# Patient Record
Sex: Male | Born: 1945 | Race: Asian | Hispanic: No | Smoking: Former smoker
Health system: Southern US, Community
[De-identification: ages and names within clinical notes are randomized; demographics above are authoritative.]

## PROBLEM LIST (undated history)

## (undated) ENCOUNTER — Emergency Department (HOSPITAL_COMMUNITY): Admission: EM | Disposition: A | Discharge status: Home / Self Care | Payer: Self-pay

## (undated) DIAGNOSIS — D126 Benign neoplasm of colon, unspecified: Secondary | ICD-10-CM

## (undated) DIAGNOSIS — A159 Respiratory tuberculosis unspecified: Secondary | ICD-10-CM

## (undated) DIAGNOSIS — F32A Depression, unspecified: Secondary | ICD-10-CM

## (undated) DIAGNOSIS — G8929 Other chronic pain: Secondary | ICD-10-CM

## (undated) DIAGNOSIS — K648 Other hemorrhoids: Secondary | ICD-10-CM

## (undated) DIAGNOSIS — R519 Headache, unspecified: Secondary | ICD-10-CM

## (undated) DIAGNOSIS — K5730 Diverticulosis of large intestine without perforation or abscess without bleeding: Secondary | ICD-10-CM

## (undated) DIAGNOSIS — C61 Malignant neoplasm of prostate: Secondary | ICD-10-CM

## (undated) DIAGNOSIS — I6203 Nontraumatic chronic subdural hemorrhage: Secondary | ICD-10-CM

## (undated) DIAGNOSIS — F329 Major depressive disorder, single episode, unspecified: Secondary | ICD-10-CM

## (undated) DIAGNOSIS — F419 Anxiety disorder, unspecified: Secondary | ICD-10-CM

## (undated) DIAGNOSIS — R51 Headache: Secondary | ICD-10-CM

## (undated) HISTORY — DX: Headache, unspecified: R51.9

## (undated) HISTORY — DX: Other chronic pain: G89.29

## (undated) HISTORY — DX: Major depressive disorder, single episode, unspecified: F32.9

## (undated) HISTORY — DX: Anxiety disorder, unspecified: F41.9

## (undated) HISTORY — PX: OTHER SURGICAL HISTORY: SHX169

## (undated) HISTORY — DX: Nontraumatic chronic subdural hemorrhage: I62.03

## (undated) HISTORY — DX: Depression, unspecified: F32.A

## (undated) HISTORY — DX: Respiratory tuberculosis unspecified: A15.9

## (undated) HISTORY — DX: Headache: R51

---

## 2008-06-29 ENCOUNTER — Encounter: Admission: RE | Admit: 2008-06-29 | Discharge: 2008-06-29 | Payer: Self-pay | Admitting: General Practice

## 2008-07-02 ENCOUNTER — Emergency Department (HOSPITAL_COMMUNITY): Admission: EM | Admit: 2008-07-02 | Discharge: 2008-07-02 | Payer: Self-pay | Admitting: Physician Assistant

## 2008-07-20 ENCOUNTER — Encounter: Admission: RE | Admit: 2008-07-20 | Discharge: 2008-07-20 | Payer: Self-pay | Admitting: Pulmonary Disease

## 2008-08-06 ENCOUNTER — Ambulatory Visit (HOSPITAL_COMMUNITY): Admission: AD | Admit: 2008-08-06 | Discharge: 2008-08-06 | Payer: Self-pay | Admitting: Neurological Surgery

## 2008-09-07 ENCOUNTER — Emergency Department (HOSPITAL_COMMUNITY): Admission: EM | Admit: 2008-09-07 | Discharge: 2008-09-07 | Payer: Self-pay | Admitting: Psychiatry

## 2009-06-04 ENCOUNTER — Encounter: Admission: RE | Admit: 2009-06-04 | Discharge: 2009-06-04 | Payer: Self-pay | Admitting: Family Medicine

## 2009-08-27 ENCOUNTER — Emergency Department (HOSPITAL_COMMUNITY): Admission: EM | Admit: 2009-08-27 | Discharge: 2009-08-27 | Payer: Self-pay | Admitting: Emergency Medicine

## 2009-08-30 ENCOUNTER — Encounter: Payer: Self-pay | Admitting: Physician Assistant

## 2009-09-03 ENCOUNTER — Encounter: Payer: Self-pay | Admitting: Physician Assistant

## 2009-09-19 ENCOUNTER — Encounter (INDEPENDENT_AMBULATORY_CARE_PROVIDER_SITE_OTHER): Payer: Self-pay | Admitting: Nurse Practitioner

## 2009-09-23 ENCOUNTER — Ambulatory Visit: Payer: Self-pay | Admitting: Physician Assistant

## 2009-09-23 ENCOUNTER — Telehealth: Payer: Self-pay | Admitting: Physician Assistant

## 2009-09-23 DIAGNOSIS — I6200 Nontraumatic subdural hemorrhage, unspecified: Secondary | ICD-10-CM | POA: Insufficient documentation

## 2009-09-23 DIAGNOSIS — K5900 Constipation, unspecified: Secondary | ICD-10-CM | POA: Insufficient documentation

## 2009-09-23 DIAGNOSIS — R519 Headache, unspecified: Secondary | ICD-10-CM | POA: Insufficient documentation

## 2009-09-23 DIAGNOSIS — N401 Enlarged prostate with lower urinary tract symptoms: Secondary | ICD-10-CM

## 2009-09-23 DIAGNOSIS — N138 Other obstructive and reflux uropathy: Secondary | ICD-10-CM

## 2009-09-23 DIAGNOSIS — K625 Hemorrhage of anus and rectum: Secondary | ICD-10-CM

## 2009-09-23 DIAGNOSIS — R51 Headache: Secondary | ICD-10-CM

## 2009-09-23 LAB — CONVERTED CEMR LAB: OCCULT 1: NEGATIVE

## 2009-09-24 DIAGNOSIS — R799 Abnormal finding of blood chemistry, unspecified: Secondary | ICD-10-CM

## 2009-09-24 DIAGNOSIS — R972 Elevated prostate specific antigen [PSA]: Secondary | ICD-10-CM

## 2009-09-24 LAB — CONVERTED CEMR LAB
ALT: 14 units/L (ref 0–53)
AST: 17 units/L (ref 0–37)
Albumin: 4.3 g/dL (ref 3.5–5.2)
Basophils Absolute: 0.1 10*3/uL (ref 0.0–0.1)
Basophils Relative: 1 % (ref 0–1)
Creatinine, Ser: 1.04 mg/dL (ref 0.40–1.50)
Eosinophils Relative: 1 % (ref 0–5)
Glucose, Bld: 107 mg/dL — ABNORMAL HIGH (ref 70–99)
Hemoglobin: 13.6 g/dL (ref 13.0–17.0)
Lymphocytes Relative: 39 % (ref 12–46)
Lymphs Abs: 2.2 10*3/uL (ref 0.7–4.0)
MCHC: 33.1 g/dL (ref 30.0–36.0)
Neutro Abs: 3.1 10*3/uL (ref 1.7–7.7)
PSA: 5.71 ng/mL — ABNORMAL HIGH (ref 0.10–4.00)
Platelets: 242 10*3/uL (ref 150–400)
RBC: 4.08 M/uL — ABNORMAL LOW (ref 4.22–5.81)
Sodium: 140 meq/L (ref 135–145)
WBC: 5.8 10*3/uL (ref 4.0–10.5)

## 2009-09-27 ENCOUNTER — Encounter: Payer: Self-pay | Admitting: Physician Assistant

## 2009-09-30 ENCOUNTER — Encounter (INDEPENDENT_AMBULATORY_CARE_PROVIDER_SITE_OTHER): Payer: Self-pay | Admitting: *Deleted

## 2009-10-09 ENCOUNTER — Ambulatory Visit: Payer: Self-pay | Admitting: Physician Assistant

## 2009-10-10 LAB — CONVERTED CEMR LAB
Phosphorus: 3.1 mg/dL (ref 2.3–4.6)
RBC Folate: 500 ng/mL (ref 180–600)
Vitamin B-12: 552 pg/mL (ref 211–911)

## 2009-10-11 ENCOUNTER — Encounter: Payer: Self-pay | Admitting: Physician Assistant

## 2009-10-11 LAB — CONVERTED CEMR LAB
BUN: 21 mg/dL (ref 6–23)
CO2: 21 meq/L (ref 19–32)
Chloride: 108 meq/L (ref 96–112)
Glucose, Bld: 81 mg/dL (ref 70–99)
Sodium: 141 meq/L (ref 135–145)

## 2009-10-13 ENCOUNTER — Telehealth: Payer: Self-pay | Admitting: Physician Assistant

## 2009-10-15 ENCOUNTER — Encounter: Payer: Self-pay | Admitting: Physician Assistant

## 2009-10-17 ENCOUNTER — Encounter (INDEPENDENT_AMBULATORY_CARE_PROVIDER_SITE_OTHER): Payer: Self-pay | Admitting: *Deleted

## 2009-10-24 ENCOUNTER — Ambulatory Visit: Payer: Self-pay | Admitting: Physician Assistant

## 2009-10-24 DIAGNOSIS — N529 Male erectile dysfunction, unspecified: Secondary | ICD-10-CM

## 2009-10-24 LAB — CONVERTED CEMR LAB
Blood in Urine, dipstick: NEGATIVE
Glucose, Urine, Semiquant: NEGATIVE
PSA: 4.91 ng/mL — ABNORMAL HIGH (ref 0.10–4.00)
Urobilinogen, UA: 0.2
pH: 5.5

## 2009-10-25 ENCOUNTER — Telehealth: Payer: Self-pay | Admitting: Physician Assistant

## 2009-10-25 ENCOUNTER — Encounter (INDEPENDENT_AMBULATORY_CARE_PROVIDER_SITE_OTHER): Payer: Self-pay | Admitting: *Deleted

## 2009-10-28 ENCOUNTER — Encounter: Payer: Self-pay | Admitting: Physician Assistant

## 2009-10-28 ENCOUNTER — Encounter (INDEPENDENT_AMBULATORY_CARE_PROVIDER_SITE_OTHER): Payer: Self-pay | Admitting: *Deleted

## 2009-10-30 ENCOUNTER — Encounter (INDEPENDENT_AMBULATORY_CARE_PROVIDER_SITE_OTHER): Payer: Self-pay | Admitting: *Deleted

## 2009-11-04 ENCOUNTER — Encounter: Payer: Self-pay | Admitting: Physician Assistant

## 2009-11-09 ENCOUNTER — Telehealth: Payer: Self-pay | Admitting: Physician Assistant

## 2010-04-07 ENCOUNTER — Encounter: Payer: Self-pay | Admitting: Neurological Surgery

## 2010-04-15 NOTE — Letter (Signed)
Summary: Generic Letter  [facility]  [address] [place], [place] [address]   Phone: [phone]  Fax: [phone]    09/27/2009  Fotios Lascola [address] [name], [place]  [address]  Dear Mr. Urton,  We have been unable to contact you by telephone.  Please call our office, at your earliest convenience, so that we may speak with you.   Sincerely,   Dutch Quint RN

## 2010-04-15 NOTE — Letter (Signed)
Summary: [facility]   [facility]   Imported By: Arta Bruce 11/11/2009 15:47:20  _____________________________________________________________________  External Attachment:    Type:   Image     Comment:   External Document

## 2010-04-15 NOTE — Progress Notes (Signed)
Summary: Seen by Neuro 8.22.2011  Phone Note Outgoing Call   Summary of Call: Rec'd notes from neurology. Patient arrived there without any interpreter. I don't think that is our responsibility, but if there is anything we can do to facilitate that in the future please do.  He is supposed to f/u with Dr. Anne Hahn in 3 mos.  Initial call taken by: Brynda Rim,  November 09, 2009 3:47 PM  Follow-up for Phone Call        I didn't made the referral but I will call them about the 3 months f/u . What I do is let the Dr's office that the pt need an interpreter or the pt need to provide one because some places don't have it .  I call pt and his phone number is disconected  Follow-up by: Cheryll Dessert,  November 12, 2009 5:31 PM    New/Updated Medications: NORTRIPTYLINE HCL 10 MG CAPS (NORTRIPTYLINE HCL) 1 by mouth at bedtime for 1 week, then 2 by mouth at bedtime    Past History:  Past Medical History: chronic headaches   a. eval by Dr. Anne Hahn 8.22.2011 and nortriptyline started h/o chronic subdural hematoma   Impression & Recommendations:  Problem # 1:  HEADACHE (ICD-784.0)  His updated medication list for this problem includes:    Naproxen 500 Mg Tabs (Naproxen) .Marland Kitchen... Take 1 tablet by mouth once a day with food as needed for pain  Complete Medication List: 1)  Naproxen 500 Mg Tabs (Naproxen) .... Take 1 tablet by mouth once a day with food as needed for pain 2)  Miralax Powd (Polyethylene glycol 3350) .... Dissolve one capful in glass of water and drink one glass once daily as needed for constipation 3)  Nortriptyline Hcl 10 Mg Caps (Nortriptyline hcl) .Marland Kitchen.. 1 by mouth at bedtime for 1 week, then 2 by mouth at bedtime

## 2010-04-15 NOTE — Letter (Signed)
Summary: *HSN Results Follow up  [facility]  [address] [place], [place] [address]   Phone: [phone]  Fax: [phone]      10/28/2009   [name] [address] [name], [place]  [address]   Dear  [name],                            ____S.Drinkard,FNP   ____D. Gore,FNP       ____B. McPherson,MD   ____V. Rankins,MD    ____E. Mulberry,MD    ____N. Daphine Deutscher, FNP  ____D. Reche Dixon, MD    ____K. Philipp Deputy, MD    ____Other     This letter is to inform you that your recent test(s):  _______Pap Smear    _______Lab Test     _______X-ray    _______ is within acceptable limits  _______ requires a medication change  _______ requires a follow-up lab visit  _______ requires a follow-up visit with your provider   Comments:  We have been trying to reach you.  Please give the office a call at your earliest convenience.       _________________________________________________________ If you have any questions, please contact our office                     Sincerely,  [name] [facility]

## 2010-04-15 NOTE — Letter (Signed)
Summary: *HSN Results Follow up  [facility]  [address] [place], [place] [address]   Phone: [phone]  Fax: [phone]      10/17/2009   [name] [address] [name], [place]  [address]   Dear  [name],                            ____S.Drinkard,FNP   ____D. Gore,FNP       ____B. McPherson,MD   ____V. Rankins,MD    ____E. Mulberry,MD    ____N. Daphine Deutscher, FNP  ____D. Reche Dixon, MD    ____K. Philipp Deputy, MD    ____Other     This letter is to inform you that your recent test(s):  _______Pap Smear    _______Lab Test     _______X-ray    _______ is within acceptable limits  _______ requires a medication change  _______ requires a follow-up lab visit  _______ requires a follow-up visit with your provider   Comments:  We have been trying to reach you.  Please give the office a call at your earliest convenience.       _________________________________________________________ If you have any questions, please contact our office                     Sincerely,  [name] [facility]

## 2010-04-15 NOTE — Letter (Signed)
Summary: *HSN Results Follow up  [facility]  [address] [place], [place] [address]   Phone: [phone]  Fax: [phone]      09/30/2009   Karmine Martos [address] [name], [place]  [address]   Dear  Mr. Fielding Staniszewski,                            ____S.Drinkard,FNP   ____D. Gore,FNP       ____B. McPherson,MD   ____V. Rankins,MD    ____E. Mulberry,MD    ____N. Daphine Deutscher, FNP  ____D. Reche Dixon, MD    ____K. Philipp Deputy, MD    ____Other     This letter is to inform you that your recent test(s):  _______Pap Smear    _______Lab Test     _______X-ray    _______ is within acceptable limits  ___X____ requires a medication change  ___X____ requires a follow-up lab visit  _______ requires a follow-up visit with your provider   Comments: We have been trying to reach you.  Please give the office a call at your earliest convenience.       _________________________________________________________ If you have any questions, please contact our office                     Sincerely,  [name] [facility]

## 2010-04-15 NOTE — Miscellaneous (Signed)
  Clinical Lists Changes  Problems: Assessed ELEVATED PROSTATE SPECIFIC ANTIGEN as comment only - repeat at OV 8.11.2011 refer to urology if still elevated        Impression & Recommendations:  Problem # 1:  ELEVATED PROSTATE SPECIFIC ANTIGEN (ICD-790.93) repeat at OV 8.11.2011 refer to urology if still elevated  Complete Medication List: 1)  Naproxen 500 Mg Tabs (Naproxen) .... Take 1 tablet by mouth once a day with food as needed for pain 2)  Miralax Powd (Polyethylene glycol 3350) .... Dissolve one capful in glass of water and drink one glass once daily as needed for constipation

## 2010-04-15 NOTE — Letter (Signed)
Summary: BLOOD PRESSURE READINGS  BLOOD PRESSURE READINGS   Imported By: Arta Bruce 10/28/2009 12:53:33  _____________________________________________________________________  External Attachment:    Type:   Image     Comment:   External Document

## 2010-04-15 NOTE — Letter (Signed)
Summary: [facility]  [facility]   Imported By: Arta Bruce 10/01/2009 12:30:21  _____________________________________________________________________  External Attachment:    Type:   Image     Comment:   External Document

## 2010-04-15 NOTE — Progress Notes (Signed)
Summary: Refer to Urology  Phone Note Outgoing Call   Summary of Call: Line disconnected... [name]  October 25, 2009 4:51 PM   PSA still elevated. Refer to urology. Notify [name] Send to [place] after [employer] notified. Initial call taken by: Tereso Newcomer PA-C,  October 25, 2009 9:15 AM  Follow-up for Phone Call        number disconnected and will mail letter... Follow-up by: [name],  October 25, 2009 12:45 PM  Additional Follow-up for Phone Call Additional follow up Details #1::        number is disconnected. . will mail letter... [name]  October 28, 2009 12:25 PM        Impression & Recommendations:  Problem # 1:  ELEVATED PROSTATE SPECIFIC ANTIGEN (ICD-790.93)  Orders: Urology Referral (Urology)  Complete Medication List: 1)  Naproxen 500 Mg Tabs (Naproxen) .... Take 1 tablet by mouth once a day with food as needed for pain 2)  Miralax Powd (Polyethylene glycol 3350) .... Dissolve one capful in glass of water and drink one glass once daily as needed for constipation

## 2010-04-15 NOTE — Letter (Signed)
Summary: [facility]  [facility]   Imported By: Arta Bruce 10/10/2009 15:18:54  _____________________________________________________________________  External Attachment:    Type:   Image     Comment:   External Document

## 2010-04-15 NOTE — Letter (Signed)
Summary: *HSN Results Follow up  [facility]  [address] [place], [place] [address]   Phone: [phone]  Fax: [phone]      10/15/2009   [name] [address] [name], [place]  [address]   Dear  [name],                            ____S.Drinkard,FNP   ____D. Gore,FNP       ____B. McPherson,MD   ____V. Rankins,MD    ____E. Mulberry,MD    ____N. Daphine Deutscher, FNP  ____D. Reche Dixon, MD    ____K. Philipp Deputy, MD    __x__S. Alben Spittle, PA-C     This letter is to inform you that your recent test(s):  _______Pap Smear    ___x____Lab Test     _______X-ray    ___x____ is within acceptable limits  _______ requires a medication change  ___x____ requires a follow-up lab visit  _______ requires a follow-up visit with your [facility]   Comments: We need to redo your prostate blood test at your next visit.  It was elevated when checked previously.       _________________________________________________________ If you have any questions, please contact our office                     Sincerely,  Ross Newcomer PA-C [facility]

## 2010-04-15 NOTE — Letter (Signed)
Summary: Letter//PLEASE CONTACT OFFICE  Letter//PLEASE CONTACT OFFICE   Imported By: Arta Bruce 09/19/2009 12:42:30  _____________________________________________________________________  External Attachment:    Type:   Image     Comment:   External Document
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
## 2010-04-15 NOTE — Progress Notes (Signed)
Summary: Neuro Appt.  Phone Note Outgoing Call   Summary of Call: Make sure Benedict Kue aware that he has appt with Dr. Anne Hahn at [facility] on 8.22.2011 at 3:00 Initial call taken by: Brynda Rim,  October 13, 2009 2:19 PM  Follow-up for Phone Call        line is busy Follow-up by: [name],  October 14, 2009 11:09 AM  Additional Follow-up for Phone Call Additional follow up Details #1::        number is disconnected.Marland KitchenMarland KitchenArmenia Shannon  October 14, 2009 3:39 PM number is disconnected.Marland KitchenMarland KitchenMarland KitchenMarland Kitchen will mail letter... [name]  October 17, 2009 8:43 AM

## 2010-04-15 NOTE — Letter (Signed)
Summary: *HSN Results Follow up  [facility]  [address] [place], [place] [address]   Phone: [phone]  Fax: [phone]      10/25/2009   [name] [address] [name], [place]  [address]   Dear  [name],                            ____S.Drinkard,FNP   ____D. Gore,FNP       ____B. McPherson,MD   ____V. Rankins,MD    ____E. Mulberry,MD    ____N. Daphine Deutscher, FNP  ____D. Reche Dixon, MD    ____K. Philipp Deputy, MD    ____Other     This letter is to inform you that your recent test(s):  _______Pap Smear    _______Lab Test     _______X-ray    _______ is within acceptable limits  _______ requires a medication change  _______ requires a follow-up lab visit  __X_____ requires a follow-up visit with your provider   Comments:  We have been trying to reach you.  Please give the office a call at your earliest convenience.       _________________________________________________________ If you have any questions, please contact our office                     Sincerely,  [name] [facility]
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
## 2010-04-15 NOTE — Assessment & Plan Note (Signed)
Summary: FU IN ONE MONTH WITH SCOTT, PSA////GK   Vital Signs:  Patient profile:   65 year old male Height:      62 inches Weight:      121.4 pounds BMI:     22.28 Temp:     97.1 degrees F oral Pulse rate:   64 / minute Pulse rhythm:   regular Resp:     18 per minute BP sitting:   105 / 68  (left arm) Cuff size:   regular  Vitals Entered By: [name] (October 24, 2009 3:29 PM) CC: 1 month follow-up PSA Is Patient Diabetic? No Pain Assessment Patient in pain? no       Does patient need assistance? Functional Status Self care Ambulation Normal   Primary Care Xavi Tomasik:  Tereso Newcomer, PA-C  CC:  1 month follow-up PSA.  History of Present Illness: Here for f/u. Labs ok.  But, PSA elevated. Needs repeat today. Had complained of BPH symptoms last time.  He had some enlargement of his prostate on exam.  Today states he was having trouble with urine leakage.  However, this is resolved.  He denies dysuria.  He is no longer having any problems with his urine. He tells the interpreter that he is turning from a male into a male.  Upon further questioning, he is describing erectile dysfunction. His headaches are better.  He was supposed to take naproxen as needed.  It sounds like he is taking every day. He has an appt with [facility] on 8/22.  However, I cannot be certain he knows how to get there.  He handed me the packet in the room and states he does not know how to get there.   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 Take 1 Tablet By Mouth Once A Day With Food As Needed For Pain 2)   Miralax  Powd (Polyethylene Glycol 3350) .... Dissolve One Capful in Glass of Water and Drink One Glass Once Daily As Needed For Constipation  Allergies (verified): No Known Drug Allergies  Past History:  Past Medical History: Last updated: 09/23/2009 chronic headaches h/o chronic subdural hematoma  Physical Exam  General:  alert, well-developed, and well-nourished.   Head:  normocephalic and atraumatic.   Neck:  supple.   Lungs:  normal breath sounds, no crackles, and no wheezes.   Heart:  normal rate and regular rhythm.   Abdomen:  soft, non-tender, and no hepatomegaly.   Neurologic:  alert & oriented X3 and cranial nerves II-XII intact.   Psych:  normally interactive.     Impression & Recommendations:  Problem # 1:  HEADACHE (ICD-784.0) 2/2 chronic subdural hematoma will give him instructions for tylenol and again reiterate that he takes the naproxen as needed he has appt with [facility] on 8.22 I have asked [facility] to call him and arrange help with getting to his appt  His updated medication list for this problem includes:    Naproxen 500 Mg Tabs (Naproxen) .Marland Kitchen... Take 1 tablet by mouth once a day with food as needed for pain  Problem # 2:  ELEVATED PROSTATE SPECIFIC ANTIGEN (ICD-790.93)  repeat today if elevated send to urology  Orders: T-PSA (04540-98119)  Problem # 3:  ORGANIC IMPOTENCE (ICD-607.84)  would defer to urology if he continues to have an elevated PSA  Orders: T-Urinalysis (14782-95621)  Problem # 4:  CBC, ABNORMAL (ICD-790.99) f/u testing normal  Problem # 5:  CONSTIPATION (ICD-564.00) states improved with miralax  His updated medication list for this problem includes:    Miralax Powd (Polyethylene glycol 3350) .Marland Kitchen... Dissolve one capful in glass of water and drink one glass once daily as needed for constipation  Problem # 6:  RECTAL BLEEDING (ICD-569.3) nothing further since he started miralax  Problem # 7:  PREVENTIVE HEALTH CARE  (ICD-V70.0) schedule CPE can discuss colo then  Complete Medication List: 1)  Naproxen 500 Mg Tabs (Naproxen) .... Take 1 tablet by mouth once a day with food as needed for pain 2)  Miralax Powd (Polyethylene glycol 3350) .... Dissolve one capful in glass of water and drink one glass once daily as needed for constipation  Patient Instructions: 1)  Please schedule a follow-up appointment in 4 months for CPE with Scott. 2)  You can take Tylenol (Acetaminophen) 500 mg 1-2 tabs every 6 hours as needed for headache.  You can get it over the counter at the pharmacy.  You do not need a prescription.  You can take this every day for headaches if needed.  You should never take more than 2 tablets every 6 hours. 3)  You should take the Naproxen with food two times a day only as needed for headaches.  Do not take every day. 4)  Ask for refills at the pharmacy for the miralax when you run out as well as your other medicine.  If you are out of refills, the pharmacy will call me. 5)  Someone from [facility] will contact you to help you get to your appointment with the neurologist.   Laboratory Results   Urine Tests  Date/Time Received: October 24, 2009 5:33 PM   Routine Urinalysis   Color: yellow Appearance: Clear Glucose: negative   (Normal Range: Negative) Bilirubin: negative   (Normal Range: Negative) Ketone: negative   (Normal Range: Negative) Spec. Gravity: >=1.030   (Normal Range: 1.003-1.035) Blood: negative   (Normal Range: Negative) pH: 5.5   (Normal Range: 5.0-8.0) Protein: trace   (Normal Range: Negative) Urobilinogen: 0.2   (Normal Range: 0-1) Nitrite: negative   (Normal Range: Negative) Leukocyte Esterace: negative   (Normal Range: Negative)

## 2010-04-15 NOTE — Progress Notes (Signed)
  Phone Note Outgoing Call   Summary of Call: please contact [facility] patient has appt 8.22 speaks vietnamese only asked [facility] to help him get to appt but they cannot please see if they have a way of contacting him to make sure he knows how to get there  Initial call taken by: Brynda Rim,  October 25, 2009 4:55 PM  Follow-up for Phone Call        number is disconnected. .... [name]  October 29, 2009 11:58 AM  number disconnected... [name]  October 30, 2009 10:51 AM   will mail letter.... [name]  October 30, 2009 12:49 PM

## 2010-04-15 NOTE — Letter (Signed)
Summary: APPT DATE & TIME [facility]  APPT DATE & TIME [facility]   Imported By: Arta Bruce 11/14/2009 14:57:09  _____________________________________________________________________  External Attachment:    Type:   Image     Comment:   External Document

## 2010-04-15 NOTE — Letter (Signed)
Summary: *HSN Results Follow up  [facility]  [address] [place], [place] [address]   Phone: [phone]  Fax: [phone]      10/30/2009   Montey Licklider [address] [name], [place]  [address]   Dear  Mr. Mateus Drawdy,                            ____S.Drinkard,FNP   ____D. Gore,FNP       ____B. McPherson,MD   ____V. Rankins,MD    ____E. Mulberry,MD    ____N. Daphine Deutscher, FNP  ____D. Reche Dixon, MD    ____K. Philipp Deputy, MD    ____Other     This letter is to inform you that your recent test(s):  _______Pap Smear    _______Lab Test     _______X-ray    _______ is within acceptable limits  _______ requires a medication change  _______ requires a follow-up lab visit  _______ requires a follow-up visit with your provider   Comments:  We have been trying to reach you.  Please give the office a call at your [place] convenience.       _________________________________________________________ If you have any questions, please contact our office                     Sincerely,  [name] [facility]

## 2010-05-15 ENCOUNTER — Encounter: Payer: Self-pay | Admitting: Internal Medicine

## 2010-05-15 ENCOUNTER — Encounter (INDEPENDENT_AMBULATORY_CARE_PROVIDER_SITE_OTHER): Payer: Self-pay | Admitting: Internal Medicine

## 2010-05-15 DIAGNOSIS — R82998 Other abnormal findings in urine: Secondary | ICD-10-CM | POA: Insufficient documentation

## 2010-05-15 LAB — CONVERTED CEMR LAB
Nitrite: POSITIVE
Protein, U semiquant: NEGATIVE
Urobilinogen, UA: 0.2
pH: 6

## 2010-05-16 ENCOUNTER — Encounter (INDEPENDENT_AMBULATORY_CARE_PROVIDER_SITE_OTHER): Payer: Self-pay | Admitting: Internal Medicine

## 2010-05-19 ENCOUNTER — Encounter (INDEPENDENT_AMBULATORY_CARE_PROVIDER_SITE_OTHER): Payer: Self-pay | Admitting: Internal Medicine

## 2010-05-19 ENCOUNTER — Telehealth (INDEPENDENT_AMBULATORY_CARE_PROVIDER_SITE_OTHER): Payer: Self-pay | Admitting: Internal Medicine

## 2010-05-19 DIAGNOSIS — E7800 Pure hypercholesterolemia, unspecified: Secondary | ICD-10-CM | POA: Insufficient documentation

## 2010-05-19 LAB — CONVERTED CEMR LAB
ALT: 13 units/L (ref 0–53)
Albumin: 4.6 g/dL (ref 3.5–5.2)
Alkaline Phosphatase: 49 units/L (ref 39–117)
Basophils Absolute: 0 10*3/uL (ref 0.0–0.1)
Glucose, Bld: 91 mg/dL (ref 70–99)
HCT: 41.6 % (ref 39.0–52.0)
Hemoglobin: 14 g/dL (ref 13.0–17.0)
Lymphocytes Relative: 32 % (ref 12–46)
MCHC: 33.7 g/dL (ref 30.0–36.0)
MCV: 97.2 fL (ref 78.0–100.0)
Neutro Abs: 3.7 10*3/uL (ref 1.7–7.7)
Neutrophils Relative %: 61 % (ref 43–77)
PSA: 6.02 ng/mL — ABNORMAL HIGH (ref <=4.00)
Potassium: 4.4 meq/L (ref 3.5–5.3)
RBC: 4.28 M/uL (ref 4.22–5.81)
Sodium: 138 meq/L (ref 135–145)
WBC: 6 10*3/uL (ref 4.0–10.5)

## 2010-05-23 ENCOUNTER — Encounter (INDEPENDENT_AMBULATORY_CARE_PROVIDER_SITE_OTHER): Payer: Self-pay | Admitting: *Deleted
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
## 2010-05-27 NOTE — Progress Notes (Signed)
Summary: Urology referral--another attempt  Phone Note Outgoing Call   Summary of Call: Nora--does not appears we were ever able to get hold of him for elevated PSA last fall.  PSA continues to rise and needs a Urology referral--new order written. Initial call taken by: Julieanne Manson MD,  May 19, 2010 2:45 PM  Follow-up for Phone Call        PT HAS AN APPT [facility] 06-10-10 @ 3:30PM DR MARK NESI  ADDRESS [address]  PHONE # DISC  MAILED A LETTER TO PT.Marland KitchenCheryll Dessert  May 23, 2010 9:37 AM      Appended Document: Urology referral--another attempt PT HAS AN APPT [facility]  06-03-10 @ 4:30PM PH # DISC MAILED A LETTER

## 2010-05-27 NOTE — Letter (Signed)
Summary: *Referral Letter  [facility]  [address] [place], [place] [address]   Phone: [phone]  Fax: [phone]    05/19/2010  Thank you in advance for agreeing to see my patient:  [name] [address] [name], [place]  [address]  Phone: [phone]  Reason for Referral: Increasing PSA with BPH and urinary obstruction symptoms.  Will enclose PSAs from last fall and recent.  We have had difficulty contacting pt. to get him referred.  He does not speak [place].  No obvious abnormality on prostate exam.  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) 2)  URINALYSIS, ABNORMAL (ICD-791.9) 3)  LONG-TERM (CURRENT) USE OF OTHER MEDICATIONS (ICD-V58.69) 4)  ORGANIC IMPOTENCE (ICD-607.84) 5)  ELEVATED PROSTATE SPECIFIC ANTIGEN (ICD-790.93) 6)  CBC, ABNORMAL (ICD-790.99) 7)  PREVENTIVE HEALTH CARE (ICD-V70.0) 8)  HYPERTROPHY PROSTATE W/UR OBST & OTH LUTS (ICD-600.01) 9)  RECTAL BLEEDING (ICD-569.3) 10)  CONSTIPATION (ICD-564.00) 11)  SUBDURAL HEMATOMA, CHRONIC (ICD-432.1) 12)  HEADACHE (ICD-784.0)   Current Medications: 1)  NAPROXEN 500 MG TABS (NAPROXEN) Take 1 tablet by mouth once a day with food as needed for pain 2)  MIRALAX  POWD (POLYETHYLENE GLYCOL 3350) dissolve one capful in glass of water and drink one glass once daily as needed for constipation 3)  NORTRIPTYLINE HCL 10 MG CAPS (NORTRIPTYLINE HCL) 1 by mouth at bedtime for 1 week, then 2 by mouth at bedtime 4)  TAMSULOSIN HCL 0.4 MG CAPS (TAMSULOSIN HCL) 1 tab by mouth at betime 5)  FINASTERIDE 5 MG TABS (FINASTERIDE) 1 tab by mouth daily   Past Medical History: 1)  chronic headaches 2)    a. eval by Dr. Anne Hahn 8.22.2011 and nortriptyline started 3)  h/o chronic subdural hematoma   Prior History of Blood Transfusions:   Pertinent Labs:    Thank you again for agreeing to see our patient;  please contact us if you have any further questions or need additional information.  Sincerely,  Julieanne Manson MD

## 2010-05-27 NOTE — Letter (Signed)
Summary: *HSN Results Follow up  [facility]  [address] [place], [place] [address]   Phone: [phone]  Fax: [phone]      05/23/2010   [name] [address] [name], [place]  [address]   Dear  [name],                            Comments: WE HAVE BEEN TRYING TO GET IN CONTACT WITH YOU BY PHONE BUT IS DISCCONECTED . DR MULBERRY WANT TO REFER YOU TO A UROLOGIST & GI  SPECIALIST.   *YOU HAVE AN APPT 06-10-10 @ 3:15 PM [facility] PH# [phone] ADDRESS [address]  ([facility]).  *YOU HAVE ANOTHER APPT [facility]  07-01-10 @ 10:00AM  DR Yancey Flemings  [facility] # [phone] ADDRESS [address] . PLEASE, TAKE YOUR MEDICATIONS WITH YOU AND YOUR MEDICAID.  IF YOU HAVE ANY QUESTIONS  PLEASE, CALL [name] [phone]  THANK YOU              _________________________________________________________ If you have any questions, please contact our office                     Sincerely,  [name] [facility]

## 2010-05-27 NOTE — Letter (Signed)
Summary: New Patient letter  [facility]  [address] [place], [place] [address]   Phone: [phone]  Fax: [phone]       05/23/2010 MRN: [id number]  [facility] [address] [name], [place]  [address]  Dear [name],  Welcome to the Gastroenterology Division at [facility].    You are scheduled to see Dr. Yancey Flemings on July 01, 2010 at 10:15am on the 3rd floor at [facility], [address]  We ask that you try to arrive at our office 15 minutes prior to your appointment time to allow for check-in.  We would like you to complete the enclosed self-administered evaluation form prior to your visit and bring it with you on the day of your appointment.  We will review it with you.  Also, please bring a complete list of all your medications or, if you prefer, bring the medication bottles and we will list them.  Please bring your insurance card so that we may make a copy of it.  If your insurance requires a referral to see a specialist, please bring your referral form from your primary care physician.  Co-payments are due at the time of your visit and may be paid by cash, check or credit card.     Your office visit will consist of a consult with your physician (includes a physical exam), any laboratory testing he/she may order, scheduling of any necessary diagnostic testing (e.g. x-ray, ultrasound, CT-scan), and scheduling of a procedure (e.g. Endoscopy, Colonoscopy) if required.  Please allow enough time on your schedule to allow for any/all of these possibilities.    If you cannot keep your appointment, please call [phone] to cancel or reschedule prior to your appointment date.  This allows [name] the opportunity to schedule an appointment for another patient in need of care.  If you do not cancel or reschedule by 5 p.m. the business day prior to your appointment date, you will be charged a $50.00 late cancellation/no-show fee.    Thank you for choosing  [facility] for your medical needs.  We appreciate the opportunity to care for you.  Please visit [name] at our website  to learn more about our practice.                     Sincerely,                                                             The Gastroenterology Division

## 2010-06-15 DIAGNOSIS — D126 Benign neoplasm of colon, unspecified: Secondary | ICD-10-CM

## 2010-06-15 HISTORY — DX: Benign neoplasm of colon, unspecified: D12.6

## 2010-06-23 LAB — DIFFERENTIAL
Eosinophils Absolute: 0 10*3/uL (ref 0.0–0.7)
Eosinophils Relative: 1 % (ref 0–5)
Lymphocytes Relative: 46 % (ref 12–46)
Lymphs Abs: 2.7 10*3/uL (ref 0.7–4.0)
Monocytes Relative: 7 % (ref 3–12)
Neutro Abs: 2.7 10*3/uL (ref 1.7–7.7)

## 2010-06-23 LAB — CBC
Hemoglobin: 13.5 g/dL (ref 13.0–17.0)
MCHC: 33.3 g/dL (ref 30.0–36.0)
MCV: 100.5 fL — ABNORMAL HIGH (ref 78.0–100.0)
Platelets: 196 10*3/uL (ref 150–400)
RBC: 4.03 MIL/uL — ABNORMAL LOW (ref 4.22–5.81)
WBC: 5.9 10*3/uL (ref 4.0–10.5)

## 2010-06-23 LAB — COMPREHENSIVE METABOLIC PANEL
AST: 21 U/L (ref 0–37)
Albumin: 3.8 g/dL (ref 3.5–5.2)
Chloride: 112 mEq/L (ref 96–112)
Creatinine, Ser: 1.06 mg/dL (ref 0.4–1.5)
GFR calc Af Amer: >60 mL/min (ref >60)
Glucose, Bld: 84 mg/dL (ref 70–99)
Potassium: 4.2 mEq/L (ref 3.5–5.1)
Sodium: 143 mEq/L (ref 135–145)
Total Protein: 6.8 g/dL (ref 6.0–8.3)

## 2010-06-23 LAB — RAPID URINE DRUG SCREEN, HOSP PERFORMED
Amphetamines: NOT DETECTED
Benzodiazepines: NOT DETECTED
Cocaine: NOT DETECTED
Opiates: NOT DETECTED

## 2010-06-23 LAB — URINALYSIS, ROUTINE W REFLEX MICROSCOPIC
Bilirubin Urine: NEGATIVE
Hgb urine dipstick: NEGATIVE
Ketones, ur: NEGATIVE mg/dL
Nitrite: NEGATIVE
Urobilinogen, UA: 0.2 mg/dL (ref 0.0–1.0)

## 2010-06-23 LAB — ETHANOL: Alcohol, Ethyl (B): 6 mg/dL (ref 0–10)

## 2010-06-24 LAB — BASIC METABOLIC PANEL
BUN: 16 mg/dL (ref 6–23)
CO2: 28 mEq/L (ref 19–32)
GFR calc Af Amer: >60 mL/min (ref >60)
GFR calc non Af Amer: >60 mL/min (ref >60)
Glucose, Bld: 93 mg/dL (ref 70–99)

## 2010-06-24 LAB — CBC
HCT: 40.3 % (ref 39.0–52.0)
Hemoglobin: 13.7 g/dL (ref 13.0–17.0)
RBC: 4.11 MIL/uL — ABNORMAL LOW (ref 4.22–5.81)
RDW: 12.9 % (ref 11.5–15.5)
WBC: 5.8 10*3/uL (ref 4.0–10.5)

## 2010-06-25 LAB — COMPREHENSIVE METABOLIC PANEL
AST: 16 U/L (ref 0–37)
Albumin: 3.4 g/dL — ABNORMAL LOW (ref 3.5–5.2)
Alkaline Phosphatase: 74 U/L (ref 39–117)
BUN: 16 mg/dL (ref 6–23)
Chloride: 107 mEq/L (ref 96–112)
Potassium: 4.2 mEq/L (ref 3.5–5.1)
Total Bilirubin: 0.3 mg/dL (ref 0.3–1.2)

## 2010-06-25 LAB — CBC
HCT: 40 % (ref 39.0–52.0)
Platelets: 219 10*3/uL (ref 150–400)
WBC: 6.5 10*3/uL (ref 4.0–10.5)

## 2010-06-25 LAB — DIFFERENTIAL
Basophils Absolute: 0 10*3/uL (ref 0.0–0.1)
Basophils Relative: 1 % (ref 0–1)
Eosinophils Relative: 3 % (ref 0–5)
Monocytes Absolute: 0.4 10*3/uL (ref 0.1–1.0)
Neutro Abs: 3.2 10*3/uL (ref 1.7–7.7)

## 2010-07-01 ENCOUNTER — Encounter: Payer: Self-pay | Admitting: Internal Medicine

## 2010-07-01 ENCOUNTER — Ambulatory Visit (INDEPENDENT_AMBULATORY_CARE_PROVIDER_SITE_OTHER): Payer: Medicaid Other | Admitting: Internal Medicine

## 2010-07-01 VITALS — BP 106/68 | HR 72 | Ht 61.0 in | Wt 123.0 lb

## 2010-07-01 DIAGNOSIS — K5900 Constipation, unspecified: Secondary | ICD-10-CM

## 2010-07-01 DIAGNOSIS — K625 Hemorrhage of anus and rectum: Secondary | ICD-10-CM

## 2010-07-01 DIAGNOSIS — Z1211 Encounter for screening for malignant neoplasm of colon: Secondary | ICD-10-CM

## 2010-07-01 NOTE — Progress Notes (Signed)
HISTORY OF PRESENT ILLNESS:  [name] is a 65 y.o. male [place] political refugee who is seen today regarding rectal bleeding and constipation. He is accompanied by a professional interpreter from [place]. The patient reports a several year history of intermittent rectal bleeding as manifested by red blood on the tissue and in the toilet bowl. He has had problems with constipation recently for which he was treated with MiraLax, with good results. Review of outside records finds a normal hemoglobin of 14.0 on 05/15/2010. He denies abdominal pain, weight loss, or family history of colon cancer. No prior colon cancer screening or colonoscopy.  REVIEW OF SYSTEMS:  All non-GI ROS negative except for headaches.  Past Medical History  Diagnosis Date  . Chronic headaches   . Subdural hematoma, chronic   . Anxiety   . Depression     Past Surgical History  Procedure Date  . Shrapnel removal     skull during [name] War    Social History [name]  reports that he has quit smoking. He has never used smokeless tobacco. He reports that he drinks alcohol. He reports that he does not use illicit drugs.  family history is not on file.  No Known Allergies     PHYSICAL EXAMINATION: Vital signs: BP 106/68  Pulse 72  Ht 5\' 1"  (1.549 m)  Wt 123 lb (55.792 kg)  BMI 23.24 kg/m2  Constitutional: generally well-appearing, no acute distress Psychiatric: alert and oriented x3, cooperative Eyes: extraocular movements intact, anicteric, conjunctiva pink Mouth: oral pharynx moist, no lesions Neck: supple no lymphadenopathy Cardiovascular: heart regular rate and rhythm, no murmur Lungs: clear to auscultation bilaterally Abdomen: soft, nontender, nondistended, no obvious ascites, no peritoneal signs, normal bowel sounds, no organomegaly Rectal: Deferred until colonoscopy Extremities: no lower extremity edema bilaterally Skin: no lesions on visible extremities Neuro: No focal deficits.    ASSESSMENT:  #1. Intermittent rectal bleeding. Given duration, likely due to benign anorectal pathology. Rule out neoplasia. #2. Constipation. Improved with MiraLax. #3. Colon cancer screening. Appropriate candidate without contraindication  PLAN:  #1. The MiraLax when necessary for constipation #2. Colonoscopy.The nature of the procedure, as well as the risks, benefits, and alternatives were carefully and thoroughly reviewed with the patient. Ample time for discussion and questions allowed. The patient understood, was satisfied, and agreed to proceed. Movi prep prescribed(actually given free sample). Patient instructed on its use.

## 2010-07-01 NOTE — Progress Notes (Signed)
This is a prior patient of mine from [facility]. Please fax to his new PCP.

## 2010-07-01 NOTE — Patient Instructions (Signed)
Colonoscopy scheduled for 07/04/10 8:00 am arrive at 7:30 am on 4th floor Movi prep sample given to you in office. Colonoscopy brochure given for you to review.

## 2010-07-02 NOTE — Progress Notes (Signed)
I will fax this over to [facility] today to Dr. Julieanne Manson. Danielle Rankin

## 2010-07-03 ENCOUNTER — Encounter: Payer: Self-pay | Admitting: Internal Medicine

## 2010-07-04 ENCOUNTER — Encounter: Payer: Self-pay | Admitting: Internal Medicine

## 2010-07-04 ENCOUNTER — Ambulatory Visit (AMBULATORY_SURGERY_CENTER): Payer: Medicaid Other | Admitting: Internal Medicine

## 2010-07-04 VITALS — BP 121/75 | HR 68 | Temp 97.8°F | Resp 20 | Ht 61.0 in | Wt 123.0 lb

## 2010-07-04 DIAGNOSIS — D126 Benign neoplasm of colon, unspecified: Secondary | ICD-10-CM

## 2010-07-04 DIAGNOSIS — Z1211 Encounter for screening for malignant neoplasm of colon: Secondary | ICD-10-CM

## 2010-07-04 DIAGNOSIS — K5730 Diverticulosis of large intestine without perforation or abscess without bleeding: Secondary | ICD-10-CM

## 2010-07-04 DIAGNOSIS — K625 Hemorrhage of anus and rectum: Secondary | ICD-10-CM

## 2010-07-04 MED ORDER — SODIUM CHLORIDE 0.9 % IV SOLN: 500.0000 mL | INTRAVENOUS | Status: DC

## 2010-07-04 NOTE — Patient Instructions (Signed)
Please refer to green and blue discharge instruction sheets.  Call [phone] for any questions or concerns.  Thank you for choosing [facility] for your medical needs.  A staff nurse will call you the next business day (Monday) as a courtesy.

## 2010-07-07 ENCOUNTER — Telehealth: Payer: Self-pay | Admitting: *Deleted

## 2010-07-07 NOTE — Telephone Encounter (Signed)
Follow up Call- Patient questions:  Do you have a fever, pain , or abdominal swelling? no Pain Score  0 *  Have you tolerated food without any problems? yes  Have you been able to return to your normal activities? yes  Do you have any questions about your discharge instructions: Diet   no Medications  no Follow up visit  yes  Do you have questions or concerns about your Care? no  Actions: * If pain score is 4 or above: No action needed, pain <4.  Patient's caregiver questioned whether he needed to come back for an appointment today. Informed only if he had a previously scheduled appointment or wsomeone had called him. Referred patient to [facility] report  Under recommendations.

## 2010-07-29 NOTE — Consult Note (Signed)
NAMEDEAVEN, URWIN                   ACCOUNT NO.:  [id number]   MEDICAL RECORD NO.:  [id number]          PATIENT TYPE:  EMS   LOCATION:  [facility]                         FACILITY:  [facility]   PHYSICIAN:  Stefani Dama, M.D.  DATE OF BIRTH:  [birth year]   DATE OF CONSULTATION:  07/02/2008  DATE OF DISCHARGE:  07/02/2008                                 CONSULTATION   REQUESTOR:  Katherine Roan, MD   REASON FOR REQUEST:  Subdural hematoma.   HISTORY OF PRESENT ILLNESS:  [name] is a 65 year old right-handed  [place] refugee who had been complaining of headache since his  arrival here back in November 2009.  The headaches had apparently become  progressively worse, and this past Friday he was seen in an urgent care  setting and a CT scan was performed.  The scan revealed the presence of  a moderately-sized right subdural hematoma over the parietal convexity  which caused some mass effect on the brain and some 3-4 mm of midline  shift.  The patient has been complaining of headache in the left frontal  and left parietal regions.  He was brought to the [facility]  Room today where a CT scan was repeated and it showed essentially no  change from the scan a few days ago.  The patient is now seen in  neurosurgical consultation for definitive treatment.  The history is  difficult to obtain.  We had used 2 different telephone interpreters to  obtain some further history.  The patient obviously had a traumatic past  having been in a reeducation camp for 6 years in [name] prior to  his release and ultimate exodus from the country.  He had concerns about  having any surgical procedures performed and he related this rather  clearly to 2 separate interpreters indicating that he did not want to  have an operation and die.  His understanding of the fact that he has a  subdural hematoma is fairly clear and on the basis of discussions with  the interpreters, their sense is that the  patient is lucid.  He is clear  in his thoughts and his desires.   His past medical history is notable for the fact that he has not had any  medical treatment as best can be determined.   It is unknown that he has any allergies, and clearly he has not been on  any medications.   SOCIAL HISTORY:  He is married.  His wife had been injured when a tree  fell on her and she has had a significant number of facial surgeries for  broken bones, this apparently had occurred while they were still in  [name].  The patient's working status is otherwise is that of a refugee  but it is apparent that he does not have a job as best I can determine  at the current time.  Caseworker that is present with him knows little  about him as she is not the usual caseworker for him.   His systems review is notable that he denies  any nausea or vomiting.  Denies diplopia, ringing in the years.  His singular complaint is head  pain in the left frontal region.  He also complains of some low back  pain.  He denies any numbness or tingling in his legs or in his arms.   On his physical examination, his pupils are 4-mm briskly reactive to  light and accommodation.  The extraocular movements are full.  Face is  symmetric to grimace.  Tongue and uvula are in the midline.  Head  reveals an abnormal shape with an enlarged right parietal fossa, but no  evidence of any subgaleal collections or hematomas.  There is no  tenderness in about the head.  His neck reveals normal carotid  upstrokes.  No masses are noted.  There is no tenderness or fullness in  the supraclavicular fossae.  His lungs are clear to auscultation.  The  heart has a regular rate and rhythm.  No murmurs are noted.  The abdomen  is soft.  Bowel sounds are positive.  No masses are noted.  Extremities  reveal no cyanosis, clubbing, or edema.  Movement of all 4 extremities  is good.  They are symmetric.  There is no evidence of a drift.  Deep  tendon reflexes  are 2+ in the biceps and triceps, 1+ in the patellae and  the Achilles.  Babinski's are downgoing.   IMPRESSION:  The patient has evidence of a right-sided chronic and  subacute subdural hematoma.  The patient is adamant about not having  surgical treatment at the current time.  I discussed with him several  different ways through 2 different interpreters, the importance of  treatment of this process that though there is a risk of death with  surgery, is imminently small that there is a greater risk of this  process becoming more severe and chronic and possibly needing surgery on  an emergent basis.  At this time, he is rather adamant about his refusal  for surgery and I suggested that in a minimum we have a followup scan in  a month's time, and I can see him as an outpatient in the office.  In  the meantime, I discussed with his caseworker and with the patient as  best I could through the interpreter available that he would need to be  followed up, and should he have any worsening he needs to return to the  emergency department or contact our office.      Stefani Dama, M.D.  Electronically Signed     HJE/MEDQ  D:  07/02/2008  T:  07/03/2008  Job:  [id number]

## 2010-08-15 DIAGNOSIS — K5730 Diverticulosis of large intestine without perforation or abscess without bleeding: Secondary | ICD-10-CM

## 2010-08-15 DIAGNOSIS — K648 Other hemorrhoids: Secondary | ICD-10-CM

## 2010-08-15 HISTORY — DX: Diverticulosis of large intestine without perforation or abscess without bleeding: K57.30

## 2010-08-15 HISTORY — DX: Other hemorrhoids: K64.8

## 2010-08-25 ENCOUNTER — Ambulatory Visit
Admission: RE | Admit: 2010-08-25 | Discharge: 2010-08-25 | Disposition: A | Discharge status: Home / Self Care | Payer: Medicaid Other | Source: Ambulatory Visit | Attending: Radiation Oncology | Admitting: Radiation Oncology

## 2010-08-25 DIAGNOSIS — G4730 Sleep apnea, unspecified: Secondary | ICD-10-CM | POA: Insufficient documentation

## 2010-08-25 DIAGNOSIS — C61 Malignant neoplasm of prostate: Secondary | ICD-10-CM | POA: Insufficient documentation

## 2010-08-25 DIAGNOSIS — Z79899 Other long term (current) drug therapy: Secondary | ICD-10-CM | POA: Insufficient documentation

## 2010-08-25 DIAGNOSIS — Z510 Encounter for antineoplastic radiation therapy: Secondary | ICD-10-CM | POA: Insufficient documentation

## 2010-11-24 ENCOUNTER — Ambulatory Visit
Admission: RE | Admit: 2010-11-24 | Discharge: 2010-11-24 | Disposition: A | Discharge status: Home / Self Care | Payer: Medicaid Other | Source: Ambulatory Visit | Attending: Radiation Oncology | Admitting: Radiation Oncology

## 2010-11-24 DIAGNOSIS — C61 Malignant neoplasm of prostate: Secondary | ICD-10-CM | POA: Insufficient documentation

## 2010-11-24 DIAGNOSIS — Z510 Encounter for antineoplastic radiation therapy: Secondary | ICD-10-CM | POA: Insufficient documentation

## 2010-12-25 ENCOUNTER — Ambulatory Visit
Admission: RE | Admit: 2010-12-25 | Discharge: 2010-12-25 | Disposition: A | Discharge status: Home / Self Care | Payer: Medicaid Other | Source: Ambulatory Visit | Attending: Radiation Oncology | Admitting: Radiation Oncology

## 2012-01-02 ENCOUNTER — Emergency Department (INDEPENDENT_AMBULATORY_CARE_PROVIDER_SITE_OTHER)
Admission: EM | Admit: 2012-01-02 | Discharge: 2012-01-02 | Disposition: A | Discharge status: Home / Self Care | Payer: Medicaid Other | Source: Home / Self Care | Attending: Emergency Medicine | Admitting: Emergency Medicine

## 2012-01-02 ENCOUNTER — Encounter (HOSPITAL_COMMUNITY): Payer: Self-pay | Admitting: Emergency Medicine

## 2012-01-02 DIAGNOSIS — J069 Acute upper respiratory infection, unspecified: Secondary | ICD-10-CM

## 2012-01-02 HISTORY — DX: Malignant neoplasm of prostate: C61

## 2012-01-02 MED ORDER — GUAIFENESIN-CODEINE 100-10 MG/5ML PO SYRP: 5.0000 mL | ORAL_SOLUTION | Freq: Three times a day (TID) | ORAL | Status: DC | PRN

## 2012-01-02 MED ORDER — SALINE NASAL SPRAY 0.65 % NA SOLN: 1.0000 | NASAL | Status: DC | PRN

## 2012-01-02 MED ORDER — CETIRIZINE HCL 10 MG PO CHEW: 10.0000 mg | CHEWABLE_TABLET | Freq: Every day | ORAL | Status: DC

## 2012-01-02 NOTE — ED Notes (Signed)
Pt is here w/sponsor... Via interpreter, pt c/o cough x4 weeks w/yellow sputum... Sx include: sore throat, itchy throat, loss of appetite... Denies: fevers, vomiting, diarrhea... Pt is alert w/no signs of distress.

## 2012-01-02 NOTE — ED Provider Notes (Signed)
History     CSN: [id number]  Arrival date & time 01/02/12  1230   None     Chief Complaint  Patient presents with  . Cough    (Consider location/radiation/quality/duration/timing/severity/associated sxs/prior treatment) Patient is a 65 y.o. male presenting with cough. The history is provided by the patient. The history is limited by a language barrier. A language interpreter was used.  Cough This is a new problem. The current episode started more than 1 week ago (3 weeks ago). The problem occurs hourly. The problem has not changed since onset.The cough is productive of sputum. There has been no fever. Associated symptoms include rhinorrhea and sore throat. He has tried decongestants (mucinex) for the symptoms. The treatment provided mild relief. He is not a smoker (former). His past medical history does not include bronchitis, pneumonia or COPD.    Past Medical History  Diagnosis Date  . Chronic headaches   . Subdural hematoma, chronic   . Anxiety   . Depression   . Prostate cancer     receiving radiation treatment    Past Surgical History  Procedure Date  . Shrapnel removal     skull during [name] War    No family history on file.  History  Substance Use Topics  . Smoking status: Former Games developer  . Smokeless tobacco: Never Used  . Alcohol Use: 0.6 oz/week    1 Cans of beer per week     1 beer daily      Review of Systems  Constitutional: Positive for appetite change.  HENT: Positive for congestion, sore throat, rhinorrhea and sinus pressure.   Eyes: Positive for itching.  Respiratory: Positive for cough.   All other systems reviewed and are negative.    Allergies  Review of patient's allergies indicates no known allergies.  Home Medications   Current Outpatient Rx  Name Route Sig Dispense Refill  . CETIRIZINE HCL 10 MG PO CHEW Oral Chew 1 tablet (10 mg total) by mouth daily. 30 tablet 3  . FINASTERIDE 5 MG PO TABS Oral Take 5 mg by mouth daily.        . GUAIFENESIN-CODEINE 100-10 MG/5ML PO SYRP Oral Take 5 mLs by mouth 3 (three) times daily as needed for cough. 180 mL 0  . NAPROXEN 500 MG PO TABS Oral Take 500 mg by mouth daily as needed. With food     . NORTRIPTYLINE HCL 10 MG PO CAPS Oral Take 10 mg by mouth 2 (two) times daily. At bedtime     . POLYETHYLENE GLYCOL 3350 PO PACK Oral Take 17 g by mouth daily.      Marland Kitchen SALINE NASAL SPRAY 0.65 % NA SOLN Nasal Place 1 spray into the nose as needed for congestion. 30 mL 12  . TAMSULOSIN HCL 0.4 MG PO CAPS Oral Take 0.4 mg by mouth at bedtime.        BP 105/72  Pulse 63  Temp 98 F (36.7 C) (Oral)  Resp 18  SpO2 99%  Physical Exam  Nursing note and vitals reviewed. Constitutional: He is oriented to person, place, and time. Vital signs are normal. He appears well-developed and well-nourished. He is active and cooperative.  HENT:  Head: Normocephalic.  Right Ear: Tympanic membrane, external ear and ear canal normal. Decreased hearing is noted.  Left Ear: Hearing, external ear and ear canal normal.  Nose: Rhinorrhea present.  Mouth/Throat: Uvula is midline and mucous membranes are normal. Posterior oropharyngeal erythema present. No oropharyngeal exudate or posterior  oropharyngeal edema.       Unable to visualize left TM, cerumen impaction. Nasal discharge  Eyes: Conjunctivae normal and EOM are normal. Pupils are equal, round, and reactive to light. No scleral icterus.  Neck: Trachea normal, normal range of motion and full passive range of motion without pain. Neck supple.  Cardiovascular: Normal rate, regular rhythm, normal heart sounds, intact distal pulses and normal pulses.   Pulmonary/Chest: Effort normal and breath sounds normal.  Lymphadenopathy:       Head (right side): No submental, no submandibular, no tonsillar, no preauricular, no posterior auricular and no occipital adenopathy present.       Head (left side): No submental, no submandibular, no tonsillar, no preauricular, no  posterior auricular and no occipital adenopathy present.    He has no cervical adenopathy.  Neurological: He is alert and oriented to person, place, and time. No cranial nerve deficit or sensory deficit.  Skin: Skin is warm and dry.  Psychiatric: He has a normal mood and affect. His speech is normal and behavior is normal. Judgment and thought content normal. Cognition and memory are normal.    ED Course  Procedures (including critical care time)   Labs Reviewed  POCT RAPID STREP A ([facility] CARE ONLY)   No results found.  Rapid strep negative 1. URI (upper respiratory infection)       MDM  Increase fluids, saline nasal spray for sinus congestion.  Zyrtec daily.  Cough syrup as needed.  Follow up with primary care provider as needed.        Johnsie Kindred, NP 01/02/12 1443

## 2012-01-03 NOTE — ED Provider Notes (Signed)
Medical screening examination/treatment/procedure(s) were performed by non-physician practitioner and as supervising physician I was immediately available for consultation/collaboration.  Leslee Home, M.D.   Reuben Likes, MD 01/03/12 2138

## 2012-03-06 ENCOUNTER — Encounter (HOSPITAL_COMMUNITY): Payer: Self-pay

## 2012-03-06 ENCOUNTER — Emergency Department (INDEPENDENT_AMBULATORY_CARE_PROVIDER_SITE_OTHER): Payer: Medicaid Other

## 2012-03-06 ENCOUNTER — Emergency Department (HOSPITAL_COMMUNITY)
Admission: EM | Admit: 2012-03-06 | Discharge: 2012-03-06 | Disposition: A | Discharge status: Home / Self Care | Payer: Medicaid Other | Source: Home / Self Care

## 2012-03-06 DIAGNOSIS — K1230 Oral mucositis (ulcerative), unspecified: Secondary | ICD-10-CM

## 2012-03-06 DIAGNOSIS — K121 Other forms of stomatitis: Secondary | ICD-10-CM

## 2012-03-06 DIAGNOSIS — R109 Unspecified abdominal pain: Secondary | ICD-10-CM

## 2012-03-06 LAB — POCT I-STAT, CHEM 8
BUN: 20 mg/dL (ref 6–23)
Creatinine, Ser: 1.4 mg/dL — ABNORMAL HIGH (ref 0.50–1.35)
Potassium: 4.7 mEq/L (ref 3.5–5.1)
Sodium: 141 mEq/L (ref 135–145)

## 2012-03-06 MED ORDER — MAGIC MOUTHWASH W/LIDOCAINE: 5.0000 mL | Freq: Four times a day (QID) | ORAL | Status: DC | PRN

## 2012-03-06 NOTE — ED Provider Notes (Signed)
History     CSN: [id number]  Arrival date & time 03/06/12  1427   None     Chief Complaint  Patient presents with  . Bloated    (Consider location/radiation/quality/duration/timing/severity/associated sxs/prior treatment) HPI Comments: 66 year old [place] male presents with his "girlfriend" to be evaluated for oral lesions that are painful; and laying around and weak. She noticed that his recent stool had something that looked like worms in it. The patient's greatest concern is that of oral buccal pain. The girlfriend denies he has had fever or vomiting but that he has been having intermittent abdominal discomfort. Denies cough, shortness of breath or urinary symptoms.   History reviewed. No pertinent past medical history.  History reviewed. No pertinent past surgical history.  History reviewed. No pertinent family history.  History  Substance Use Topics  . Smoking status: Not on file  . Smokeless tobacco: Not on file  . Alcohol Use: Not on file      Review of Systems  Constitutional: Positive for activity change, fatigue and unexpected weight change. Negative for fever.  HENT: Negative.   Respiratory: Negative for chest tightness and shortness of breath.        Rare loose cough but not a chief complaint.  Cardiovascular: Negative for chest pain and leg swelling.  Gastrointestinal: Positive for abdominal pain. Negative for blood in stool.  Genitourinary: Negative.   Skin: Negative.     Allergies  Review of patient's allergies indicates no known allergies.  Home Medications   Current Outpatient Rx  Name  Route  Sig  Dispense  Refill  . MAGIC MOUTHWASH W/LIDOCAINE   Oral   Take 5 mLs by mouth 4 (four) times daily as needed.   180 mL   0     BP 127/86  Pulse 70  Temp 99 F (37.2 C) (Oral)  Resp 18  SpO2 99%  Physical Exam  Nursing note and vitals reviewed. Constitutional: He appears well-developed and well-nourished. No distress.  HENT:   Mouth/Throat: No oropharyngeal exudate.       There are multiple ovoid ulcerations of the tongue mucosa surface of the lips and buccal mucosa.  Neck: Neck supple.  Cardiovascular: Normal rate and normal heart sounds.   Pulmonary/Chest: Effort normal and breath sounds normal. No respiratory distress. He has no wheezes. He has no rales.  Musculoskeletal: He exhibits no edema and no tenderness.  Neurological: He is alert.  Skin: Skin is warm and dry.  Psychiatric: He has a normal mood and affect.    ED Course  Procedures (including critical care time)  Labs Reviewed  POCT I-STAT, CHEM 8 - Abnormal; Notable for the following:    Creatinine, Ser 1.40 (*)     All other components within normal limits   Dg Chest 2 View  03/06/2012  *RADIOLOGY REPORT*  Clinical Data: Fatigue, weight loss  CHEST - 2 VIEW  Comparison: None  Findings: Normal heart size, mediastinal contours, and pulmonary vascularity. Atherosclerotic calcification aorta. Lungs clear. No pleural effusion or pneumothorax. Old right rib fractures.  IMPRESSION: No acute abnormalities.   Original Report Authenticated By: Ulyses Southward, M.D.      1. Stomatitis and mucositis   2. Abdominal  pain, other specified site       MDM  There seems to be in significant treatment since the patient has been here. He slept on the edge of the bed at discharge the smiling and thanking me for his services. He is not complaining of abdominal pain  nor does he have any other complaints at this time other than his oral pain. He is prescribed Magic mouthwash containing nystatin and Xylocaine along with the other basic ingredients. For additional workup of the alleged parasite siting and abdominal discomfort that comes and goes he will followup with Dr. Quitman Livings which is on his Medicaid card. He was unable to produce a stool for [name] today. He is stable on discharge.        Hayden Rasmussen, NP 03/06/12 [phone]

## 2012-03-06 NOTE — ED Notes (Signed)
Discussed formulation of MAGIC MOUTHWASH w pharmacist

## 2012-03-06 NOTE — ED Provider Notes (Signed)
Medical screening examination/treatment/procedure(s) were performed by non-physician practitioner and as supervising physician I was immediately available for consultation/collaboration.  Leslee Home, M.D.   Reuben Likes, MD 03/06/12 2027

## 2012-03-06 NOTE — ED Notes (Signed)
Roommate/partner states she noted presence of "critters down there" the other day after he had a bath,and also has oral pain and ulcers

## 2012-03-07 ENCOUNTER — Encounter (HOSPITAL_COMMUNITY): Payer: Self-pay | Admitting: Emergency Medicine

## 2012-03-09 ENCOUNTER — Encounter (HOSPITAL_COMMUNITY): Payer: Self-pay | Admitting: Emergency Medicine

## 2012-03-09 ENCOUNTER — Emergency Department (HOSPITAL_COMMUNITY)
Admission: EM | Admit: 2012-03-09 | Discharge: 2012-03-09 | Disposition: A | Discharge status: Home / Self Care | Payer: Medicaid Other | Attending: Emergency Medicine | Admitting: Emergency Medicine

## 2012-03-09 DIAGNOSIS — K625 Hemorrhage of anus and rectum: Secondary | ICD-10-CM | POA: Insufficient documentation

## 2012-03-09 DIAGNOSIS — K1230 Oral mucositis (ulcerative), unspecified: Secondary | ICD-10-CM | POA: Insufficient documentation

## 2012-03-09 DIAGNOSIS — Z87891 Personal history of nicotine dependence: Secondary | ICD-10-CM | POA: Insufficient documentation

## 2012-03-09 DIAGNOSIS — F329 Major depressive disorder, single episode, unspecified: Secondary | ICD-10-CM | POA: Insufficient documentation

## 2012-03-09 DIAGNOSIS — K121 Other forms of stomatitis: Secondary | ICD-10-CM | POA: Insufficient documentation

## 2012-03-09 DIAGNOSIS — F3289 Other specified depressive episodes: Secondary | ICD-10-CM | POA: Insufficient documentation

## 2012-03-09 DIAGNOSIS — R51 Headache: Secondary | ICD-10-CM | POA: Insufficient documentation

## 2012-03-09 DIAGNOSIS — G8929 Other chronic pain: Secondary | ICD-10-CM | POA: Insufficient documentation

## 2012-03-09 DIAGNOSIS — Z8546 Personal history of malignant neoplasm of prostate: Secondary | ICD-10-CM | POA: Insufficient documentation

## 2012-03-09 DIAGNOSIS — F411 Generalized anxiety disorder: Secondary | ICD-10-CM | POA: Insufficient documentation

## 2012-03-09 LAB — CBC WITH DIFFERENTIAL/PLATELET
Basophils Absolute: 0.1 10*3/uL (ref 0.0–0.1)
Basophils Relative: 1 % (ref 0–1)
Eosinophils Absolute: 0.1 10*3/uL (ref 0.0–0.7)
Eosinophils Relative: 1 % (ref 0–5)
HCT: 36.5 % — ABNORMAL LOW (ref 39.0–52.0)
Hemoglobin: 12.7 g/dL — ABNORMAL LOW (ref 13.0–17.0)
Lymphocytes Relative: 35 % (ref 12–46)
Lymphs Abs: 1.9 10*3/uL (ref 0.7–4.0)
MCH: 33.2 pg (ref 26.0–34.0)
MCHC: 34.8 g/dL (ref 30.0–36.0)
MCV: 95.3 fL (ref 78.0–100.0)
Monocytes Absolute: 0.4 10*3/uL (ref 0.1–1.0)
Monocytes Relative: 6 % (ref 3–12)
Neutro Abs: 3.2 10*3/uL (ref 1.7–7.7)
Neutrophils Relative %: 57 % (ref 43–77)
Platelets: 246 10*3/uL (ref 150–400)
RBC: 3.83 MIL/uL — ABNORMAL LOW (ref 4.22–5.81)
RDW: 12.1 % (ref 11.5–15.5)
WBC: 5.6 10*3/uL (ref 4.0–10.5)

## 2012-03-09 LAB — COMPREHENSIVE METABOLIC PANEL
ALT: 20 U/L (ref 0–53)
AST: 20 U/L (ref 0–37)
Albumin: 3.5 g/dL (ref 3.5–5.2)
Alkaline Phosphatase: 75 U/L (ref 39–117)
BUN: 16 mg/dL (ref 6–23)
CO2: 27 mEq/L (ref 19–32)
Calcium: 8.8 mg/dL (ref 8.4–10.5)
Chloride: 103 mEq/L (ref 96–112)
Creatinine, Ser: 1.14 mg/dL (ref 0.50–1.35)
GFR calc Af Amer: 77 mL/min — ABNORMAL LOW (ref >90)
GFR calc non Af Amer: 66 mL/min — ABNORMAL LOW (ref >90)
Glucose, Bld: 89 mg/dL (ref 70–99)
Potassium: 4.2 mEq/L (ref 3.5–5.1)
Sodium: 138 mEq/L (ref 135–145)
Total Bilirubin: 0.3 mg/dL (ref 0.3–1.2)
Total Protein: 7.4 g/dL (ref 6.0–8.3)

## 2012-03-09 LAB — URINALYSIS, ROUTINE W REFLEX MICROSCOPIC
Bilirubin Urine: NEGATIVE
Glucose, UA: NEGATIVE mg/dL
Hgb urine dipstick: NEGATIVE
Ketones, ur: NEGATIVE mg/dL
Leukocytes, UA: NEGATIVE
Nitrite: NEGATIVE
Protein, ur: NEGATIVE mg/dL
Specific Gravity, Urine: 1.009 (ref 1.005–1.030)
Urobilinogen, UA: 0.2 mg/dL (ref 0.0–1.0)
pH: 6.5 (ref 5.0–8.0)

## 2012-03-09 LAB — ABO/RH: ABO/RH(D): O POS

## 2012-03-09 LAB — TYPE AND SCREEN
ABO/RH(D): O POS
Antibody Screen: NEGATIVE

## 2012-03-09 MED ORDER — SODIUM CHLORIDE 0.9 % IV BOLUS (SEPSIS)
1000.0000 mL | Freq: Once | INTRAVENOUS | Status: AC
  Administered 2012-03-09: 1000 mL via INTRAVENOUS

## 2012-03-09 NOTE — ED Notes (Signed)
Pt c/o of rectal bleeding, mouth sores, and weakness. Denies LOC.

## 2012-03-09 NOTE — ED Provider Notes (Signed)
History     CSN: [id number]  Arrival date & time 03/09/12  1317   First MD Initiated Contact with Patient 03/09/12 1432      Chief Complaint  Patient presents with  . Rectal Bleeding    (Consider location/radiation/quality/duration/timing/severity/associated sxs/prior treatment) HPI Comments: 66 year old male with a past medical history of prostate cancer presents today with chief complaint of rectal bleeding.  He states that he has had intermittent rectal bleeding over the past year but it has worsened significantly in the past week.  He has large amount of blood filling the toilet bowl with every bowel movement.  He has also had increased weakness over the past week.  Patient was seen and evaluated for complaint of mouth ulcers 3 days ago.  He was prescribed Magic malformation with lidocaine.  He states he has been unable to eat prior to administration of his Magic mouthwash.  He has been able to take liquids including soups and broth since that time.  Patient had an episode of severe weakness getting in the car.  He denies loss of consciousness or syncope. Recent denies  Vomiting, nausea, or diarrhea. He has no rectal pain. Denies fevers, chills, fatigue, night sweats, unexplained weight loss. Denies DOE, SOB, chest tightness or pressure, radiation to left arm, jaw or back, or diaphoresis. Denies dysuria, flank pain, suprapubic pain, frequency, urgency, or hematuria. Denies headaches, light headedness, weakness, visual disturbances. Denies abdominal pain, nausea, vomiting, diarrhea or constipation.       The history is provided by the patient and a friend. The history is limited by a language barrier. A language interpreter was used.    Past Medical History  Diagnosis Date  . Chronic headaches   . Subdural hematoma, chronic   . Anxiety   . Depression   . Prostate cancer     receiving radiation treatment    Past Surgical History  Procedure Date  . Shrapnel removal    skull during [name] War    No family history on file.  History  Substance Use Topics  . Smoking status: Former Games developer  . Smokeless tobacco: Never Used  . Alcohol Use: No     Comment: 1 beer daily      Review of Systems Ten systems reviewed and are negative for acute change, except as noted in the HPI.   Allergies  Review of patient's allergies indicates no known allergies.  Home Medications   Current Outpatient Rx  Name  Route  Sig  Dispense  Refill  . MAGIC MOUTHWASH W/LIDOCAINE   Oral   Take 5 mLs by mouth 4 (four) times daily as needed.   180 mL   0     BP 102/69  Pulse 72  Temp 98.9 F (37.2 C) (Oral)  Resp 16  SpO2 99%  Physical Exam  Nursing note and vitals reviewed. Constitutional: He is oriented to person, place, and time. He appears well-developed and well-nourished. No distress.  HENT:  Head: Normocephalic and atraumatic.  Eyes: Conjunctivae normal are normal. No scleral icterus.       Large, plaquelike  Ulcer on the tongue consistent with aphthous ulcer.  Neck: Normal range of motion. Neck supple.  Cardiovascular: Normal rate, regular rhythm, normal heart sounds and intact distal pulses.   Pulmonary/Chest: Effort normal and breath sounds normal. No respiratory distress.  Abdominal: Soft. There is tenderness (suprapubic).  Musculoskeletal: He exhibits no edema.  Neurological: He is alert and oriented to person, place, and time. No cranial nerve  deficit.       Speech is clear and goal oriented, follows commands Major Cranial nerves without deficit, no facial droop Normal strength in upper and lower extremities bilaterally including dorsiflexion and plantar flexion, strong and equal grip strength Sensation normal to light and sharp touch Moves extremities without ataxia, coordination intact   Skin: Skin is warm and dry. He is not diaphoretic.  Psychiatric: His behavior is normal.    ED Course  Procedures (including critical care time)  Labs  Reviewed  CBC WITH DIFFERENTIAL - Abnormal; Notable for the following:    RBC 3.83 (*)     Hemoglobin 12.7 (*)     HCT 36.5 (*)     All other components within normal limits  COMPREHENSIVE METABOLIC PANEL - Abnormal; Notable for the following:    GFR calc non Af Amer 66 (*)     GFR calc Af Amer 77 (*)     All other components within normal limits  URINALYSIS, ROUTINE W REFLEX MICROSCOPIC - Abnormal; Notable for the following:    APPearance CLOUDY (*)     All other components within normal limits  TYPE AND SCREEN  ABO/RH  LAB REPORT - SCANNED   No results found.   1. Rectal bleed   2. Stomatitis       MDM  4:02 PM BP 102/69  Pulse 72  Temp 98.9 F (37.2 C) (Oral)  Resp 16  SpO2 99% Patient with normal vital signs.  He brings in paperwork from his last colonoscopy in 2012.  History of diverticulosis and rectal polyps the.  He also has a history of internal hemorrhoids.  His hemoglobin is slightly low at 12.7.  His weakness may be due to poor intake from the mouth ulcers.  Obtaining labs right now.  He did have a positive fecal occult blood test.      Filed Vitals:   03/09/12 1359 03/09/12 1812 03/09/12 2005  BP: 102/69 103/72 119/74  Pulse: 72 63 74  Temp: 98.9 F (37.2 C)  97.6 F (36.4 C)  TempSrc: Oral  Oral  Resp: 16 20 18   SpO2: 99% 98% 96%   Patien with stable vitals. He has had a 1 gram drop in his hgb over the last month. I have advised the patient that he may be feeling weka because for decreased oral intake due to his stomatitis.  I am discharging the patient. He should fu with his Gastroenterologist.  He may continue to take his magic mouthwash and eat soft foods. Discussed reasons to seek immediate care. Patient expresses understanding and agrees with plan.   Arthor Captain, PA-C 03/10/12 1626

## 2012-03-09 NOTE — ED Notes (Signed)
Pt presents w/ bright red rectal bleeding x 1 week. Girlfriend states she saw it first time today. Hx of colonscopy w/ internal hemorroids and constipation off and on. Pt does not speak english, girlfriend translates.

## 2012-03-09 NOTE — ED Notes (Signed)
Pt could not urinate.  Will try in 30 minutes.

## 2012-03-10 ENCOUNTER — Telehealth: Payer: Self-pay | Admitting: Gastroenterology

## 2012-03-10 NOTE — Telephone Encounter (Signed)
Spoke to [name] who stated that the patient has been having rectal bleeding for one week. He is feeling weak. Patient apparently denies abdominal pain.  He was advised to take the patient to the emergency department for evaluation.

## 2012-03-11 NOTE — ED Provider Notes (Signed)
Medical screening examination/treatment/procedure(s) were performed by non-physician practitioner and as supervising physician I was immediately available for consultation/collaboration.  Jones Skene, M.D.     Jones Skene, MD 03/11/12 1940

## 2012-03-27 ENCOUNTER — Inpatient Hospital Stay (HOSPITAL_COMMUNITY)
Admission: EM | Admit: 2012-03-27 | Discharge: 2012-03-31 | DRG: 347 | Disposition: A | Discharge status: Home / Self Care | Payer: Medicaid Other | Attending: Internal Medicine | Admitting: Internal Medicine

## 2012-03-27 ENCOUNTER — Encounter (HOSPITAL_COMMUNITY): Payer: Self-pay | Admitting: *Deleted

## 2012-03-27 DIAGNOSIS — I951 Orthostatic hypotension: Secondary | ICD-10-CM

## 2012-03-27 DIAGNOSIS — R799 Abnormal finding of blood chemistry, unspecified: Secondary | ICD-10-CM

## 2012-03-27 DIAGNOSIS — N138 Other obstructive and reflux uropathy: Secondary | ICD-10-CM

## 2012-03-27 DIAGNOSIS — Z8546 Personal history of malignant neoplasm of prostate: Secondary | ICD-10-CM

## 2012-03-27 DIAGNOSIS — F411 Generalized anxiety disorder: Secondary | ICD-10-CM | POA: Diagnosis present

## 2012-03-27 DIAGNOSIS — K625 Hemorrhage of anus and rectum: Secondary | ICD-10-CM

## 2012-03-27 DIAGNOSIS — R51 Headache: Secondary | ICD-10-CM

## 2012-03-27 DIAGNOSIS — Z923 Personal history of irradiation: Secondary | ICD-10-CM

## 2012-03-27 DIAGNOSIS — F3289 Other specified depressive episodes: Secondary | ICD-10-CM | POA: Diagnosis present

## 2012-03-27 DIAGNOSIS — R972 Elevated prostate specific antigen [PSA]: Secondary | ICD-10-CM

## 2012-03-27 DIAGNOSIS — S069X9S Unspecified intracranial injury with loss of consciousness of unspecified duration, sequela: Secondary | ICD-10-CM

## 2012-03-27 DIAGNOSIS — F329 Major depressive disorder, single episode, unspecified: Secondary | ICD-10-CM | POA: Diagnosis present

## 2012-03-27 DIAGNOSIS — K648 Other hemorrhoids: Principal | ICD-10-CM

## 2012-03-27 DIAGNOSIS — R82998 Other abnormal findings in urine: Secondary | ICD-10-CM

## 2012-03-27 DIAGNOSIS — K5900 Constipation, unspecified: Secondary | ICD-10-CM

## 2012-03-27 DIAGNOSIS — N401 Enlarged prostate with lower urinary tract symptoms: Secondary | ICD-10-CM

## 2012-03-27 DIAGNOSIS — E7800 Pure hypercholesterolemia, unspecified: Secondary | ICD-10-CM

## 2012-03-27 DIAGNOSIS — I6200 Nontraumatic subdural hemorrhage, unspecified: Secondary | ICD-10-CM

## 2012-03-27 DIAGNOSIS — S069XAS Unspecified intracranial injury with loss of consciousness status unknown, sequela: Secondary | ICD-10-CM

## 2012-03-27 DIAGNOSIS — N529 Male erectile dysfunction, unspecified: Secondary | ICD-10-CM

## 2012-03-27 DIAGNOSIS — M6281 Muscle weakness (generalized): Secondary | ICD-10-CM | POA: Diagnosis present

## 2012-03-27 DIAGNOSIS — R55 Syncope and collapse: Secondary | ICD-10-CM

## 2012-03-27 DIAGNOSIS — Z87891 Personal history of nicotine dependence: Secondary | ICD-10-CM

## 2012-03-27 DIAGNOSIS — M25559 Pain in unspecified hip: Secondary | ICD-10-CM | POA: Diagnosis present

## 2012-03-27 DIAGNOSIS — E785 Hyperlipidemia, unspecified: Secondary | ICD-10-CM | POA: Diagnosis present

## 2012-03-27 DIAGNOSIS — K1370 Unspecified lesions of oral mucosa: Secondary | ICD-10-CM | POA: Diagnosis present

## 2012-03-27 HISTORY — DX: Diverticulosis of large intestine without perforation or abscess without bleeding: K57.30

## 2012-03-27 HISTORY — DX: Benign neoplasm of colon, unspecified: D12.6

## 2012-03-27 HISTORY — DX: Other hemorrhoids: K64.8

## 2012-03-27 LAB — CBC
HCT: 33.5 % — ABNORMAL LOW (ref 39.0–52.0)
HCT: 34.2 % — ABNORMAL LOW (ref 39.0–52.0)
Hemoglobin: 11.6 g/dL — ABNORMAL LOW (ref 13.0–17.0)
MCH: 33 pg (ref 26.0–34.0)
MCHC: 34.6 g/dL (ref 30.0–36.0)
MCV: 95.2 fL (ref 78.0–100.0)
MCV: 95 fL (ref 78.0–100.0)
Platelets: 204 10*3/uL (ref 150–400)
RBC: 3.6 MIL/uL — ABNORMAL LOW (ref 4.22–5.81)
RDW: 11.7 % (ref 11.5–15.5)
WBC: 4.4 10*3/uL (ref 4.0–10.5)

## 2012-03-27 LAB — COMPREHENSIVE METABOLIC PANEL
Albumin: 3.5 g/dL (ref 3.5–5.2)
BUN: 17 mg/dL (ref 6–23)
Creatinine, Ser: 1.14 mg/dL (ref 0.50–1.35)
GFR calc Af Amer: 77 mL/min — ABNORMAL LOW (ref >90)
Glucose, Bld: 90 mg/dL (ref 70–99)
Total Protein: 6.9 g/dL (ref 6.0–8.3)

## 2012-03-27 LAB — PROTIME-INR
INR: 1 (ref 0.00–1.49)
Prothrombin Time: 13.1 seconds (ref 11.6–15.2)

## 2012-03-27 LAB — TYPE AND SCREEN
ABO/RH(D): O POS
Antibody Screen: NEGATIVE

## 2012-03-27 MED ORDER — ONDANSETRON HCL 4 MG PO TABS: 4.0000 mg | ORAL_TABLET | Freq: Four times a day (QID) | ORAL | Status: DC | PRN

## 2012-03-27 MED ORDER — ACETAMINOPHEN 325 MG PO TABS
650.0000 mg | ORAL_TABLET | Freq: Four times a day (QID) | ORAL | Status: DC | PRN
  Administered 2012-03-30: 650 mg via ORAL
  Filled 2012-03-27: qty 2

## 2012-03-27 MED ORDER — ONDANSETRON HCL 4 MG/2ML IJ SOLN: 4.0000 mg | Freq: Three times a day (TID) | INTRAMUSCULAR | Status: DC | PRN

## 2012-03-27 MED ORDER — ACETAMINOPHEN 650 MG RE SUPP: 650.0000 mg | Freq: Four times a day (QID) | RECTAL | Status: DC | PRN

## 2012-03-27 MED ORDER — SODIUM CHLORIDE 0.9 % IV SOLN
INTRAVENOUS | Status: DC
  Administered 2012-03-27: 75 mL/h via INTRAVENOUS
  Administered 2012-03-28 – 2012-03-29 (×3): via INTRAVENOUS

## 2012-03-27 MED ORDER — SODIUM CHLORIDE 0.9 % IV SOLN: INTRAVENOUS | Status: DC

## 2012-03-27 MED ORDER — ONDANSETRON HCL 4 MG/2ML IJ SOLN: 4.0000 mg | Freq: Four times a day (QID) | INTRAMUSCULAR | Status: DC | PRN

## 2012-03-27 NOTE — ED Provider Notes (Signed)
History  This chart was scribed for Glynn Octave, MD by Bennett Scrape, ED Scribe. This patient was seen in room D30C/D30C and the patient's care was started at 1:34 PM.  CSN: [id number]  Arrival date & time 03/27/12  1258   First MD Initiated Contact with Patient 03/27/12 1334      Chief Complaint  Patient presents with  . Rectal Bleeding     The history is provided by the patient. A language interpreter was used [name].    [name] is a 67 y.o. male with a h/o prostate CA who is currently undergoing radiation who presents to the Emergency Department complaining of one week of gradual onset, gradually worsening, constant 2 to 3 episodes of rectal bleeding described as bright red blood in the toilet with BMs and getting larger in amount with associated umbilical abdominal pain and weakness. He was seen in the ED for the same Dec. 26th and discharged home with instructions to follow up with GI. He has not done so. He reports one episode of syncope in the ED witnessed by his nurse when getting him up out of the wheelchair. He denies being on blood thinners currently. He denies CP, SOB, nausea and emesis as associated symptoms. He has a h/o subdural hematoma and anxiety and is a one beer a day alcohol user and a former smoker.  Past Medical History  Diagnosis Date  . Chronic headaches   . Subdural hematoma, chronic   . Anxiety   . Depression   . Prostate cancer     receiving radiation treatment    Past Surgical History  Procedure Date  . Shrapnel removal     skull during [name] War    History reviewed. No pertinent family history.  History  Substance Use Topics  . Smoking status: Former Games developer  . Smokeless tobacco: Never Used  . Alcohol Use: No     Comment: 1 beer daily      Review of Systems  A complete 10 system review of systems was obtained and all systems are negative except as noted in the HPI and PMH.   Allergies  Review of patient's allergies  indicates no known allergies.  Home Medications   Current Outpatient Rx  Name  Route  Sig  Dispense  Refill  . MAGIC MOUTHWASH W/LIDOCAINE   Oral   Take 5 mLs by mouth 4 (four) times daily as needed.   180 mL   0     Triage Vitals: BP 94/69  Pulse 72  Temp 97.7 F (36.5 C) (Oral)  Resp 18  SpO2 100%  Physical Exam  Nursing note and vitals reviewed. Constitutional: He is oriented to person, place, and time. He appears well-developed and well-nourished. No distress.  HENT:  Head: Normocephalic and atraumatic.  Eyes: Conjunctivae normal and EOM are normal. Pupils are equal, round, and reactive to light.  Neck: Neck supple. No tracheal deviation present.  Cardiovascular: Normal rate and regular rhythm.   Pulmonary/Chest: Effort normal and breath sounds normal. No respiratory distress.  Abdominal: Soft. There is no tenderness.  Genitourinary:       Rectal tone is normal, no gross blood in the rectal vault, rectal vault was empty, dark stool that was guaiac positive, no hemorrhoids or fissures   Musculoskeletal: Normal range of motion. He exhibits no edema.  Neurological: He is alert and oriented to person, place, and time.  Skin: Skin is warm and dry.  Psychiatric: He has a normal mood and  affect. His behavior is normal.    ED Course  Procedures (including critical care time)  DIAGNOSTIC STUDIES: Oxygen Saturation is 100% on room air, normal by my interpretation.    COORDINATION OF CARE: 2:48 PM-Discussed treatment plan which includes CXR, CBC panel, and troponin with pt at bedside and pt agreed to plan.   3:54 PM-Consult complete with Dr. Irene Limbo. Patient case explained and discussed. Dr. Irene Limbo agrees to evaluate the pt in the ED to determine further treatment. Call ended at 3:56 PM.  Labs Reviewed  CBC - Abnormal; Notable for the following:    RBC 3.52 (*)     Hemoglobin 11.6 (*)     HCT 33.5 (*)     All other components within normal limits  COMPREHENSIVE  METABOLIC PANEL - Abnormal; Notable for the following:    GFR calc non Af Amer 66 (*)     GFR calc Af Amer 77 (*)     All other components within normal limits  TYPE AND SCREEN  PROTIME-INR  TROPONIN I  ABO/RH   No results found.   1. Rectal bleeding   2. Orthostasis   3. Syncope       MDM  Patient presents with rectal bleeding for the past week it has gradually worsening internal portable read. He had a near-syncopal episode on arrival to the ED. Denies any chest pain or shortness of breath. Seen December 25 for similar symptoms and sent home.  Colonoscopy 2012 reviewed. 4 polyps removed with findings of diverticulosis and internal hemorrhoids.  Hb 1 gram lower than 12/25. Orthostatics negative but patient very weak and unsteady with standing and unable to walk.  Has not followed up with GI. WIll admit for observation to Dr. Irene Limbo.   Date: 03/27/2012  Rate: 59  Rhythm: normal sinus rhythm  QRS Axis: normal  Intervals: normal  ST/T Wave abnormalities: normal  Conduction Disutrbances:none  Narrative Interpretation: anterior Q waves  Old EKG Reviewed: none available    I personally performed the services described in this documentation, which was scribed in my presence. The recorded information has been reviewed and is accurate.   Glynn Octave, MD 03/27/12 [place] Fruit

## 2012-03-27 NOTE — H&P (Signed)
History and Physical  [name] WUJ:[id number] DOB: May 06, 1945 DOA: 03/27/2012  Referring physician: Glynn Octave, M.D. PCP: No primary provider on file. none GI: Dr. Marina Goodell  Chief Complaint: rectal bleeding  HPI:  67 year old man presented to ED with history of rectal bleeding x3 weeks, dizziness, lightheadedness with standing, syncope.  Patient speaks only Vietamese. Entire interview, exam and communication of assessment and plan conducted with the aid of phone translator.  Patient reports 3 weeks of daily bleeding, bright red blood associated with bowel movements. No apparent pain. He was seen in the emergency Department 12/25 for the same, discharged with recommendations to followup outpatient. However he did not do so. He was seen by Dr. Corinda Gubler 2012 for colonoscopy. By report had diverticulosis and internal hemorrhoids. Patient also has a history of radiation for prostate cancer in 2010.  He reports lightheadedness, generalized weakness and syncope today. He has some mild right hip pain after syncope.  Afebrile in the emergency department with stable vital signs. Orthostatics are performed. CMP was unremarkable. Hemoglobin 11.6, mild decrease from Christmas Day. EKG shows sinus rhythm with no acute changes.  Review of Systems:  Negative for fever, visual changes, sore throat, rash, chest pain, SOB, dysuria,  n/v/abdominal pain.  Currently hungry.  Past Medical History  Diagnosis Date  . Chronic headaches   . Subdural hematoma, chronic   . Anxiety   . Depression   . Prostate cancer     receiving radiation treatment    Past Surgical History  Procedure Date  . Shrapnel removal     skull during [name] War    Social History:  reports that he has quit smoking. He has never used smokeless tobacco. He reports that he does not drink alcohol or use illicit drugs.  No Known Allergies  History reviewed. No pertinent family history. He is unaware of any family history as his  family died when he was young  Prior to Admission medications   Medication Sig Start Date End Date Taking? Authorizing Provider  Alum & Mag Hydroxide-Simeth (MAGIC MOUTHWASH W/LIDOCAINE) SOLN Take 5 mLs by mouth 4 (four) times daily as needed. 03/06/12   Hayden Rasmussen, NP   Physical Exam: Filed Vitals:   03/27/12 1415 03/27/12 1430 03/27/12 1437 03/27/12 1438  BP: 98/62 97/62 102/60 102/64  Pulse: 61 63 60 61  Temp:      TempSrc:      Resp: 13 15 14 14   SpO2: 99% 99% 99% 99%    General:  Examined in the emergency department. Appears calm and comfortable Eyes: PERRL, normal lids ENT: grossly normal hearing, lips. Poor dentition with periodontal disease. Neck: no LAD, masses or thyromegaly Cardiovascular: RRR, no m/r/g. No LE edema. Respiratory: CTA bilaterally, no w/r/r. Normal respiratory effort. Abdomen: soft, ntnd Skin: no rash or induration seen on limited exam GU: See documented EDP exam. The buttocks appear unremarkable, the anus appears unremarkable without external hemorrhoids. Musculoskeletal: grossly normal tone BUE/BLE Psychiatric: Appears to have normal mood and affect. Neurologic: grossly non-focal.  Wt Readings from Last 3 Encounters:  07/04/10 55.792 kg (123 lb)  07/01/10 55.792 kg (123 lb)  05/15/10 55.424 kg (122 lb 3 oz)    Labs on Admission:  Basic Metabolic Panel:  Lab 03/27/12 9562  NA 139  K 4.4  CL 104  CO2 27  GLUCOSE 90  BUN 17  CREATININE 1.14  CALCIUM 9.5  MG --  PHOS --    Liver Function Tests:  Lab 03/27/12 1345  AST 25  ALT 25  ALKPHOS 57  BILITOT 0.3  PROT 6.9  ALBUMIN 3.5   CBC:  Lab 03/27/12 1345  WBC 4.5  NEUTROABS --  HGB 11.6*  HCT 33.5*  MCV 95.2  PLT 214    Cardiac Enzymes:  Lab 03/27/12 1345  CKTOTAL --  CKMB --  CKMBINDEX --  TROPONINI <0.30   Radiological Exams on Admission: No results found.  EKG: Independently reviewed. As of   Principal Problem:  *Rectal bleeding Active Problems:   Syncope   Assessment/Plan 1. Rectal bleeding: Hemodynamically stable. Only a mild drop in hemoglobin. Serial CBC. IV fluids. Clear liquids. GI consultation placed for 1/13, discussed with Dr. Rhea Belton.  2. Syncope versus presyncope: Suspect orthostasis. Check orthostatics. IV fluids. No reason to suspect cardiac etiology. 3. History of prostate cancer: Status post radiation in 2010.  Code Status: Full code Family Communication: None present Disposition Plan/Anticipated LOS: Admit for observation, one to 2 days.  Time spent: 65 minutes  Brendia Sacks, MD  [facility] Pager [phone] 03/27/2012, 3:45 PM

## 2012-03-27 NOTE — ED Notes (Signed)
Family member reports pt having large amounts of bright red rectal bleeding x 4 weeks.

## 2012-03-27 NOTE — ED Notes (Signed)
Attempted to call report. Will call back in 10 min.  

## 2012-03-27 NOTE — ED Notes (Signed)
Assisted pt to stand and attempt to walk. Pt weak and staggered when standing alone

## 2012-03-27 NOTE — ED Notes (Signed)
Admitting MD remains at bedside.  No changes at this time.

## 2012-03-27 NOTE — ED Notes (Signed)
Rectal exam completed by MD. 

## 2012-03-27 NOTE — ED Notes (Signed)
Family in lobby

## 2012-03-28 ENCOUNTER — Encounter (HOSPITAL_COMMUNITY): Payer: Self-pay | Admitting: *Deleted

## 2012-03-28 LAB — CBC
HCT: 31.6 % — ABNORMAL LOW (ref 39.0–52.0)
HCT: 32.6 % — ABNORMAL LOW (ref 39.0–52.0)
HCT: 33.6 % — ABNORMAL LOW (ref 39.0–52.0)
Hemoglobin: 11 g/dL — ABNORMAL LOW (ref 13.0–17.0)
MCH: 33.5 pg (ref 26.0–34.0)
MCH: 32.9 pg (ref 26.0–34.0)
MCH: 33 pg (ref 26.0–34.0)
MCHC: 34.8 g/dL (ref 30.0–36.0)
MCHC: 34.2 g/dL (ref 30.0–36.0)
MCHC: 34.8 g/dL (ref 30.0–36.0)
MCV: 95 fL (ref 78.0–100.0)
MCV: 96 fL (ref 78.0–100.0)
MCV: 94.9 fL (ref 78.0–100.0)
Platelets: 220 10*3/uL (ref 150–400)
Platelets: 194 10*3/uL (ref 150–400)
RBC: 3.35 MIL/uL — ABNORMAL LOW (ref 4.22–5.81)
RBC: 3.43 MIL/uL — ABNORMAL LOW (ref 4.22–5.81)
RBC: 3.33 MIL/uL — ABNORMAL LOW (ref 4.22–5.81)
RDW: 11.9 % (ref 11.5–15.5)
RDW: 11.9 % (ref 11.5–15.5)

## 2012-03-28 LAB — BASIC METABOLIC PANEL
CO2: 26 mEq/L (ref 19–32)
Calcium: 8.7 mg/dL (ref 8.4–10.5)
Chloride: 107 mEq/L (ref 96–112)
Sodium: 140 mEq/L (ref 135–145)

## 2012-03-28 LAB — OCCULT BLOOD, POC DEVICE: Fecal Occult Bld: POSITIVE — AB

## 2012-03-28 MED ORDER — BOOST / RESOURCE BREEZE PO LIQD
1.0000 | Freq: Two times a day (BID) | ORAL | Status: DC
  Administered 2012-03-28 – 2012-03-31 (×4): 1 via ORAL

## 2012-03-28 NOTE — Progress Notes (Addendum)
PROGRESS NOTE  Jovonni Borquez ZOX:[id number] DOB: 09/28/1945 DOA: 03/27/2012 PCP: No primary provider on file.  Brief narrative: 67 yr old VN male admitted 03/27/12 with possible GIB.  Past medical history-As per Problem list Chart reviewed as below-  Seen at [facility] 12/22 for mouth ulcers? Given Magic mouthwash for this  Seen at ED 12/25 for intermittent Rectal bleeding-bbaseline Hb iin 2011 was 13.5  Seen [facility] 01/02/12 for cough  Seen in [facility] office 10/24/2009 for Elevate dPSA  Seen by NS Dr Danielle Dess 07/02/08 for Chroic SUbdural hematoma-refused surgery at that time  NOted to have chronic rectal bleeding per 09/23/09 note-thought to be consitpation related  Consultants:  GI  Procedures:  None currently  Antibiotics:  none   Subjective  Patient states he feels tired.  He said that he was in the rest room and he fell onto the bed and wanted t stand up but felt dizzy when this occurred.  If he eats hard types of food he seem to have rectal bleeding.  He states that this has been bright red in colour for about the past 3 weeks. -he didn't follow-up recently with GI. IF he sits up he feels weak He is hungry but cannot eat  He currently is being Rx for Prostate cancer and i seen by Dr. Brunilda Payor for this    Objective    Interim History: NAD  Telemetry: Sinus rhythm, HR's occasionally in the 120's  Objective: Filed Vitals:   03/27/12 1745 03/27/12 1900 03/28/12 0125 03/28/12 0426  BP: 112/79 114/63 96/77 95/54   Pulse: 98 87 70 69  Temp:  97.4 F (36.3 C) 97.9 F (36.6 C) 97.4 F (36.3 C)  TempSrc:  Oral Oral Oral  Resp: 18 18 18 16   Height:  5\' 5"  (1.651 m)    Weight:  53.2 kg (117 lb 4.6 oz)  52.663 kg (116 lb 1.6 oz)  SpO2: 99% 99% 98% 98%    Intake/Output Summary (Last 24 hours) at 03/28/12 0925 Last data filed at 03/28/12 8119  Gross per 24 hour  Intake 1057.5 ml  Output   1650 ml  Net -592.5 ml    Exam:  General: Alert pleasant VM in nad-on sitting up feels  weak Cardiovascular: s1 s2 no m/r/g Respiratory: clinically clear, no added sound Abdomen: soft, NT/ND Skinnad Neuro grossly intct  Data Reviewed: Basic Metabolic Panel:  Lab 03/28/12 1478 03/27/12 1345  NA 140 139  K 3.8 4.4  CL 107 104  CO2 26 27  GLUCOSE 81 90  BUN 13 17  CREATININE 1.00 1.14  CALCIUM 8.7 9.5  MG -- --  PHOS -- --   Liver Function Tests:  Lab 03/27/12 1345  AST 25  ALT 25  ALKPHOS 57  BILITOT 0.3  PROT 6.9  ALBUMIN 3.5   No results found for this basename: LIPASE:5,AMYLASE:5 in the last 168 hours No results found for this basename: AMMONIA:5 in the last 168 hours CBC:  Lab 03/28/12 0133 03/27/12 1959 03/27/12 1345  WBC 3.4* 4.4 4.5  NEUTROABS -- -- --  HGB 11.0* 11.8* 11.6*  HCT 31.6* 34.2* 33.5*  MCV 94.3 95.0 95.2  PLT 184 204 214   Cardiac Enzymes:  Lab 03/27/12 1345  CKTOTAL --  CKMB --  CKMBINDEX --  TROPONINI <0.30   BNP: No components found with this basename: POCBNP:5 CBG: No results found for this basename: GLUCAP:5 in the last 168 hours  No results found for this or any previous visit (from the past  240 hour(s)).   Studies:              All Imaging reviewed and is as per above notation   Scheduled Meds:  Continuous Infusions:   . sodium chloride 75 mL/hr at 03/28/12 0129     Assessment/Plan: 1. Likely slow either localized bleed vs Proctitis-Patient's Hb stable.  GI PA in room to assess patient.  Will reassess with am labs-per GI to determine further in-patient work-up if warranted.  Get orthostatics.  Ivf 75cc /hour to continue.  Clear liquid diet 2. Hypotension-unlcear if constitutional or not.  Cont IVF.  Orthostatics pending 3. ? H/o Prostate cancer-Will get notes from Dr. Brunilda Payor as daughter unclear on some h/o 4. 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 HLd-not on Meds consider at d/c home  Code Status: PResuemd full Family Communication:  Spoke with daughter in  detail-will troy to get interpreter to help with some further questions Disposition Plan: Obs tm 8, per GI   Pleas Koch, MD  [facility] Pager [phone] 03/28/2012, 9:25 AM    LOS: 1 day

## 2012-03-28 NOTE — Progress Notes (Signed)
Utilization Review Completed.   Miliana Gangwer, RN, BSN Nurse Case Manager  [phone]  

## 2012-03-28 NOTE — Progress Notes (Signed)
Patient BP 82/56. Patient asymptomatic. MD Craige Cotta aware. No new orders placed. RN will continue to monitor. Louretta Parma, RN

## 2012-03-28 NOTE — Consult Note (Signed)
[facility] Consult: 9:43 AM 03/28/2012   Referring Provider: Dr  Irene Limbo and [facility] Primary Care Physician: none .  Seen previously   Primary Gastroenterologist:  Dr. Yancey Flemings.  2012 Colonoscopy  Reason for Consultation:  Bleeding per rectum, hematochezia.  HPI: [name] is a 67 y.o. male.  Has hx minor rectal bleeding and had colonoscopy in 2012. Findings noted below.  Underwent radiation to prostate for cancer treatment in 2010.  Last urol visit was last week on Wednesday.   For 3 weeks has had formed BM associated rectal bleeding, volume of bleeding varies from small to large amounts that turn commode water red.  No rectal pain.  Bleeding more pronounced when he eats solid meals, so he has been mostly eating soups and more liquid and soft diet.  Soft diet also easier on painful oral ulcers.  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  His only med is Magic Mouthwash for the ulcers  May have lost 3 or so pounds  Went to ED on 12/25, Hgb was 12.7, compared to 13.6 on 03/06/12.  He was given MM for oral stomatitis and advised to pursue outpt GI follow up.  GI advised return to ED on 12/26 when he c/o weakness.   Had syncopal vs severe diizzy spell yesterday and fell.   This is not the first time he has fallen.  Hgb is 11.6 to 11.0.  Now admitted. MCV is normal   No NSAIDs.  No dysphagia.  No nose bleeds.   Pt speaks no [place], interview conducted with pt's daughter providing translation.    ENDOSCOPIC STUDIES: 06/2010  Colonoscopy  Dr Marina Goodell  Done for hx intermittent low level rectal bleeding.  4 polyps removed.  Moderate diverticulosis, internal hemorrhoids. Pathology:  All read as tubular adenomas. No HGD etc.  Past Medical History  Diagnosis Date  . Chronic headaches   . Subdural hematoma, chronic   . Anxiety   . Depression   . Prostate cancer     receiving radiation treatment    Past Surgical History  Procedure Date    . Shrapnel removal     skull during [name] War    Prior to Admission medications   Medication Sig Start Date End Date Taking? Authorizing Provider  Alum & Mag Hydroxide-Simeth (MAGIC MOUTHWASH W/LIDOCAINE) SOLN Take 5 mLs by mouth 4 (four) times daily as needed. 03/06/12   Hayden Rasmussen, NP    Scheduled Meds:   Infusions:    . sodium chloride 75 mL/hr at 03/28/12 0129   PRN Meds: acetaminophen, acetaminophen, ondansetron (ZOFRAN) IV, ondansetron   Allergies as of 03/27/2012  . (No Known Allergies)    History reviewed. No pertinent family history.  History   Social History  . Marital Status: Single    Spouse Name: N/A    Number of Children: 2  . Years of Education: N/A   Occupational History  . Disabled    Social History Main Topics  . Smoking status: Former Games developer  . Smokeless tobacco: Never Used  . Alcohol Use: No     Comment: 1 beer daily  . Drug Use: No  . Sexually Active: Not Currently      REVIEW OF SYSTEMS: Had flu shot in 02/2012.  Some cough. Throat feels scratchy No rash or skin ulcers. No LE edema No blood in urine. Pt has been to ED 4 times since 12,22, 5 times since 12/2011.  Has not seen dentist since teeth pulled in 2010.  PHYSICAL EXAM: Vital signs in last 24 hours: Temp:  [97.4 F (36.3 C)-98.4 F (36.9 C)] 97.4 F (36.3 C) (01/13 0426) Pulse Rate:  [60-98] 69  (01/13 0426) Resp:  [11-18] 16  (01/13 0426) BP: (94-127)/(54-89) 95/54 mmHg (01/13 0426) SpO2:  [98 %-100 %] 98 % (01/13 0426) Weight:  [52.663 kg (116 lb 1.6 oz)-53.2 kg (117 lb 4.6 oz)] 52.663 kg (116 lb 1.6 oz) (01/13 0426)  General: small framed asian male does not speak english..  Comfortable, not acutely ill Head:  No asymmetry or swelling  Eyes:  No icterus or pallor Ears:  Not HOH  Nose:  No discharge Mouth:  Caries, 2 large ulcers on upper right jaw above molars.  These are not bleeding Neck:  No mass or JVD Lungs:  Clear B.  No resp distress Heart:  RRR Abdomen:  Soft, thin, no mass, no HSM.  Active BS.   Rectal: no mass, enlarged prostate, scant stool is brown but is FOB positive.   Musc/Skeltl: no joint swelling or deformity Extremities:  No CCE  Neurologic:  Pleasant, oriented x3.  No tremor or gross weakness or deficits.  Skin:  No rash Tattoos:  none Nodes:  No inguinal or cervical adenopathy   Psych:  Pleasant, relaxed.   Intake/Output from previous day: 01/12 0701 - 01/13 0700 In: 1057.5 [P.O.:240; I.V.:817.5] Out: 1250 [Urine:1250] Intake/Output this shift: Total I/O In: -  Out: 400 [Urine:400]  LAB RESULTS:  Basename 03/28/12 0133 03/27/12 1959 03/27/12 1345  WBC 3.4* 4.4 4.5  HGB 11.0* 11.8* 11.6*  HCT 31.6* 34.2* 33.5*  PLT 184 204 214   BMET Lab Results  Component Value Date   NA 140 03/28/2012   NA 139 03/27/2012   NA 138 03/09/2012   K 3.8 03/28/2012   K 4.4 03/27/2012   K 4.2 03/09/2012   CL 107 03/28/2012   CL 104 03/27/2012   CL 103 03/09/2012   CO2 26 03/28/2012   CO2 27 03/27/2012   CO2 27 03/09/2012   GLUCOSE 81 03/28/2012   GLUCOSE 90 03/27/2012   GLUCOSE 89 03/09/2012   BUN 13 03/28/2012   BUN 17 03/27/2012   BUN 16 03/09/2012   CREATININE 1.00 03/28/2012   CREATININE 1.14 03/27/2012   CREATININE 1.14 03/09/2012   CALCIUM 8.7 03/28/2012   CALCIUM 9.5 03/27/2012   CALCIUM 8.8 03/09/2012   LFT  Basename 03/27/12 1345  PROT 6.9  ALBUMIN 3.5  AST 25  ALT 25  ALKPHOS 57  BILITOT 0.3  BILIDIR --  IBILI --   PT/INR Lab Results  Component Value Date   INR 1.00 03/27/2012   INR 1.0 08/06/2008   INR 1.0 07/02/2008   Hepatitis Panel No results found for this basename: HEPBSAG,HCVAB,HEPAIGM,HEPBIGM in the last 72 hours C-Diff No components found with this basename: cdiff    Drugs of Abuse     Component Value Date/Time   LABOPIA NONE DETECTED 09/07/2008 1518   COCAINSCRNUR NONE DETECTED 09/07/2008 1518   LABBENZ NONE DETECTED 09/07/2008 1518   AMPHETMU NONE DETECTED 09/07/2008 1518   THCU  NONE DETECTED 09/07/2008 1518   LABBARB  Value: NONE DETECTED        DRUG SCREEN FOR MEDICAL PURPOSES ONLY.  IF CONFIRMATION IS NEEDED FOR ANY PURPOSE, NOTIFY LAB WITHIN 5 DAYS.        LOWEST DETECTABLE LIMITS FOR URINE DRUG SCREEN Drug Class       Cutoff (ng/mL) Amphetamine      1000 Barbiturate  200 Benzodiazepine   200 Tricyclics       300 Opiates          300 Cocaine          300 THC              50 09/07/2008 1518     RADIOLOGY STUDIES: No results found.  IMPRESSION: *  Hematochezia.  Colonoscopy 06/2010 with tubular adenomas, tics and internal hemorrhoids. Tics or hemorrhoids could be cause of bleeding.  2010 hx radiation to treat prostate cancer. 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  *  Blood counts down 3.5 grams since 12/22, some of the drop may be effect of IVF.  Does not need blood.   *  Weakness vs syncope.  These sxs seem out of proportion to the relatively mild drop in HGB  *  Oral ulcers, chronic, needs a dentist to evaluate these.  They are not healing which raises concern for neoplasia.   PLAN: *  Requested records from urologist.   *  ? Flex sig, defer decision to Dr Arlyce Dice.  In anticipation of this, I had pt sign consent for colonoscopy or flexible sigmoidoscopy, there was a formal [place] translator present for this.     LOS: 1 day   Jennye Moccasin  03/28/2012, 9:43 AM Pager: [phone]  Chart was reviewed and patient was examined. X-rays were reviewed.    I agree with management and plans. Sufficient bleeding to cause a drop in hemoglobin. 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 Hemorrhoidal bleeding or, less likely, diverticular bleeding are other possibilities. Doubt he has a bleeding polyp or neoplasm in view of his relatively recent colonoscopy.  Plan to proceed with sigmoidoscopy.  Barbette Hair. Arlyce Dice, M.D., [facility] Cell [phone]

## 2012-03-28 NOTE — Progress Notes (Signed)
INITIAL NUTRITION ASSESSMENT  DOCUMENTATION CODES Per approved criteria  -Not Applicable   INTERVENTION: 1. Resource Breeze po BID, each supplement provides 250 kcal and 9 grams of protein. 2. RD will continue to follow     NUTRITION DIAGNOSIS: Inadequate protein energy intake related to current diet provision as evidenced by Clear liquids.   Goal: PO intake to meet >/=90% estimated nutrition needs   Monitor:  PO intake, diet advance, weight trends, I/O's  Reason for Assessment: Malnutrition Screening Tool  67 y.o. male  Admitting Dx: Rectal bleeding  ASSESSMENT: Pt admitted with rectal bleeding, plans for sigmoidoscopy today.  Spoke with pt via phone translator, pt has lost ~5lbs in unknown time frame. Intake has been mostly fruit beverages.   RD will add Resource Breeze until diet is advanced.   Height: Ht Readings from Last 1 Encounters:  03/27/12 5\' 5"  (1.651 m)    Weight: Wt Readings from Last 1 Encounters:  03/28/12 116 lb 1.6 oz (52.663 kg)    Ideal Body Weight: 136 lbs   % Ideal Body Weight: 85%  Wt Readings from Last 10 Encounters:  03/28/12 116 lb 1.6 oz (52.663 kg)  07/04/10 123 lb (55.792 kg)  07/01/10 123 lb (55.792 kg)  05/15/10 122 lb 3 oz (55.424 kg)  10/24/09 121 lb 6.4 oz (55.067 kg)  09/23/09 122 lb (55.339 kg)    Usual Body Weight: 121 lbs per pt report  % Usual Body Weight: 96%  BMI:  Body mass index is 19.32 kg/(m^2). WNL  Estimated Nutritional Needs: Kcal: 1400-1550 Protein: 53-63 gm  Fluid: 1.4-1.6 L/day  Skin: intact   Diet Order: Clear Liquid  EDUCATION NEEDS: -No education needs identified at this time   Intake/Output Summary (Last 24 hours) at 03/28/12 1502 Last data filed at 03/28/12 1435  Gross per 24 hour  Intake 2137.5 ml  Output   3726 ml  Net -1588.5 ml    Last BM: 1/13   Labs:   Lab 03/28/12 0133 03/27/12 1345  NA 140 139  K 3.8 4.4  CL 107 104  CO2 26 27  BUN 13 17  CREATININE 1.00 1.14    CALCIUM 8.7 9.5  MG -- --  PHOS -- --  GLUCOSE 81 90    CBG (last 3)  No results found for this basename: GLUCAP:3 in the last 72 hours  Scheduled Meds:   Continuous Infusions:   . sodium chloride 75 mL/hr at 03/28/12 0129    Past Medical History  Diagnosis Date  . Chronic headaches   . Subdural hematoma, chronic   . Anxiety   . Depression   . Prostate cancer     receiving radiation treatment  . Adenomatous polyp of colon 08/2010    4 polyps removed at colonoscopy by Dr Marina Goodell  . Diverticulosis of colon 08/2010  . Internal hemorrhoids 08/2010    Past Surgical History  Procedure Date  . Shrapnel removal     skull during [name] War    Clarene Duke RD, LDN Pager [phone] After Hours pager [phone]

## 2012-03-29 ENCOUNTER — Encounter (HOSPITAL_COMMUNITY)
Admission: EM | Disposition: A | Discharge status: Home / Self Care | Payer: Self-pay | Source: Home / Self Care | Attending: Family Medicine

## 2012-03-29 ENCOUNTER — Encounter (HOSPITAL_COMMUNITY): Payer: Self-pay | Admitting: *Deleted

## 2012-03-29 DIAGNOSIS — K648 Other hemorrhoids: Principal | ICD-10-CM

## 2012-03-29 HISTORY — PX: FLEXIBLE SIGMOIDOSCOPY: SHX5431

## 2012-03-29 LAB — CBC
MCH: 33.2 pg (ref 26.0–34.0)
MCHC: 35 g/dL (ref 30.0–36.0)
Platelets: 185 10*3/uL (ref 150–400)
Platelets: 206 10*3/uL (ref 150–400)
Platelets: 204 10*3/uL (ref 150–400)
RBC: 3.54 MIL/uL — ABNORMAL LOW (ref 4.22–5.81)
RBC: 3.59 MIL/uL — ABNORMAL LOW (ref 4.22–5.81)
RDW: 11.8 % (ref 11.5–15.5)
RDW: 11.9 % (ref 11.5–15.5)
RDW: 11.7 % (ref 11.5–15.5)
WBC: 4.8 10*3/uL (ref 4.0–10.5)
WBC: 4.4 10*3/uL (ref 4.0–10.5)

## 2012-03-29 SURGERY — SIGMOIDOSCOPY, FLEXIBLE
Anesthesia: Moderate Sedation

## 2012-03-29 MED ORDER — FENTANYL CITRATE 0.05 MG/ML IJ SOLN
INTRAMUSCULAR | Status: DC | PRN
  Administered 2012-03-29: 12.5 ug via INTRAVENOUS
  Administered 2012-03-29: 25 ug via INTRAVENOUS

## 2012-03-29 MED ORDER — SODIUM CHLORIDE 0.9 % IV SOLN: INTRAVENOUS | Status: DC

## 2012-03-29 MED ORDER — MIDAZOLAM HCL 5 MG/ML IJ SOLN
INTRAMUSCULAR | Status: AC
  Filled 2012-03-29: qty 1

## 2012-03-29 MED ORDER — FENTANYL CITRATE 0.05 MG/ML IJ SOLN
INTRAMUSCULAR | Status: AC
  Filled 2012-03-29: qty 2

## 2012-03-29 MED ORDER — MIDAZOLAM HCL 10 MG/2ML IJ SOLN
INTRAMUSCULAR | Status: DC | PRN
  Administered 2012-03-29: 1 mg via INTRAVENOUS
  Administered 2012-03-29: 2 mg via INTRAVENOUS

## 2012-03-29 MED ORDER — HYDROCORTISONE ACETATE 25 MG RE SUPP
25.0000 mg | Freq: Two times a day (BID) | RECTAL | Status: DC
  Administered 2012-03-29 – 2012-03-31 (×4): 25 mg via RECTAL
  Filled 2012-03-29 (×5): qty 1

## 2012-03-29 NOTE — Interval H&P Note (Signed)
History and Physical Interval Note:  03/29/2012 2:13 PM  Chadd Arquette  has presented today for surgery, with the diagnosis of bleeding per rectum  The various methods of treatment have been discussed with the patient and family. After consideration of risks, benefits and other options for treatment, the patient has consented to  Procedure(s) (LRB) with comments: FLEXIBLE SIGMOIDOSCOPY (N/A) - possibly may do colonoscopy but prep is only for a flex as a surgical intervention .  The patient's history has been reviewed, patient examined, no change in status, stable for surgery.  I have reviewed the patient's chart and labs.  Questions were answered to the patient's satisfaction.     The recent H&P (dated **03/29/12*) was reviewed, the patient was examined and there is no change in the patients condition since that H&P was completed.   Melvia Heaps  03/29/2012, 2:13 PM   Melvia Heaps

## 2012-03-29 NOTE — Op Note (Signed)
[name] [address] [place], [address]   FLEXIBLE SIGMOIDOSCOPY PROCEDURE REPORT  PATIENT: [name], Ross  MR#: [id number] BIRTHDATE: June 15, 1945 , 66  yrs. old GENDER: Male ENDOSCOPIST: Louis Meckel, MD REFERRED BY: Etta Grandchild, M.D. PROCEDURE DATE:  03/29/2012 PROCEDURE:   Sigmoidoscopy, diagnostic ASA CLASS:   Class II INDICATIONS:rectal bleeding. MEDICATIONS: These medications were titrated to patient response per physician's verbal order, Fentanyl-Detailed 37.5 mcg IV, and Versed 3 mg IV  DESCRIPTION OF PROCEDURE:   After the risks benefits and alternatives of the procedure were thoroughly explained, informed consent was obtained.  revealed no abnormalities of the rectum. The endoscope was introduced through the anus  and advanced to the descending colon , limited by No adverse events experienced.   The quality of the prep was    .  The instrument was then slowly withdrawn as the mucosa was fully examined.       1.  COLON FINDINGS: Large internal hemorrhoids were found. 2.  The colon mucosa was otherwise normal. Retroflexed views revealed internal hemorrhoid.    The scope was then withdrawn from the patient and the procedure terminated.  COMPLICATIONS: There were no complications.  ENDOSCOPIC IMPRESSION: 1.   Large internal hemorrhoids 2.   The colon mucosa was otherwise normal  rectal bleeding is likely secondary to hemorrhoids  RECOMMENDATIONS: Anusol HC suppositories; if bleeding persists or recurs would consider band ligation  REPEAT EXAM:   _______________________________ eSignedLouis Meckel, MD 03/29/2012 2:48 PM   XB:JYNW Marina Goodell, MD

## 2012-03-29 NOTE — Progress Notes (Signed)
PROGRESS NOTE  [name] MRN:[id number] DOB: 04/25/1945 DOA: 03/27/2012 PCP: No primary provider on file.  Brief narrative: 66 yr old VN male admitted 03/27/12 with possible GIB.  Past medical history-As per Problem list Chart reviewed as below-  [facility] for mouth ulcers? Given Magic mouthwash for this  Seen at ED 12/25 for intermittent Rectal bleeding-bbaseline Hb iin 2011 was 13.5  Seen [facility] 01/02/12 for cough  Seen in [facility] office 10/24/2009 for Elevate dPSA  Seen by [facility] Dr elsner 07/02/08 for Chroic SUbdural hematoma-refused surgery at that time  NOted to have chronic rectal bleeding per 09/23/09 note-thought to be consitpation related  Consultants:  GI  Procedures:  None currently  Antibiotics:  none   Subjective  Interviewed c telephone interpreter Patient states he feels much better.  No specific issues-very happy his stool is neg for blood today and rejoicing Having stool at time seen scheduled for flex sigmoidoscopy today   Objective    Interim History: NAD  Telemetry: Sinus rhythm, HR's occasionally in the 120's  Objective: Filed Vitals:   03/28/12 1432 03/28/12 2100 03/28/12 2209 03/29/12 0447  BP: 100/57 82/56 99/53 98/57  Pulse: 70 74 60 79  Temp: 97.1 F (36.2 C) 98.1 F (36.7 C)  97.9 F (36.6 C)  TempSrc: Oral Oral  Oral  Resp: 16 18 18 19  Height:      Weight:    52.481 kg (115 lb 11.2 oz)  SpO2: 98% 100%  98%    Intake/Output Summary (Last 24 hours) at 03/29/12 0921 Last data filed at 03/29/12 0805  Gross per 24 hour  Intake 3152.5 ml  Output   3876 ml  Net -723.5 ml    Exam:  General: Alert pleasant VM in nad Cardiovascular: s1 s2 no m/r/g Respiratory: clinically clear, no added sound Abdomen: soft, NT/ND Skinnad Neuro grossly intct  Data Reviewed: Basic Metabolic Panel:  Lab 03/28/12 0133 03/27/12 1345  NA 140 139  K 3.8 4.4  CL 107 104  CO2 26 27  GLUCOSE 81 90  BUN 13 17  CREATININE 1.00 1.14    CALCIUM 8.7 9.5  MG -- --  PHOS -- --   Liver Function Tests:  Lab 03/27/12 1345  AST 25  ALT 25  ALKPHOS 57  BILITOT 0.3  PROT 6.9  ALBUMIN 3.5   No results found for this basename: LIPASE:5,AMYLASE:5 in the last 168 hours No results found for this basename: AMMONIA:5 in the last 168 hours CBC:  Lab 03/29/12 0125 03/28/12 1926 03/28/12 1336 03/28/12 1011 03/28/12 0133  WBC 4.6 3.5* 4.5 5.0 3.4*  NEUTROABS -- -- -- -- --  HGB 11.1* 11.0* 11.5* 11.5* 11.0*  HCT 31.7* 31.6* 33.6* 32.6* 31.6*  MCV 94.9 94.9 96.0 95.0 94.3  PLT 185 194 192 220 184   Cardiac Enzymes:  Lab 03/27/12 1345  CKTOTAL --  CKMB --  CKMBINDEX --  TROPONINI <0.30   BNP: No components found with this basename: POCBNP:5 CBG: No results found for this basename: GLUCAP:5 in the last 168 hours  No results found for this or any previous visit (from the past 240 hour(s)).   Studies:              All Imaging reviewed and is as per above notation   Scheduled Meds:    . feeding supplement  1 Container Oral BID BM   Continuous Infusions:    . sodium chloride 75 mL/hr at 03/29/12 0626       Assessment/Plan: 1. Likely slow either localized bleed vs Proctitis-Patient's Hb stable.  For Flex sig today.  Ivf 75cc /hour to continue.  Clear liquid diet-Per GI 2. Hypotension-unlcear if constitutional or not.  Cont IVF.  Orthostatics pending still 3. H/o Prostate cancer IMRT 7/17-->11/26/10, Gleason 6, PSA 6.01-PSa in 03/23/12=0.96-stable-outpatient follow up 4. ? H/o Sleep apneoa 5. 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 HLd-not on Meds consider at d/c home  Code Status: PResuemd full Family Communication:  Spoke with daughter in detail-will troy to get interpreter to help with some further questions Disposition Plan: Obs tm 8, per GI   Jai Delrico Minehart, MD  [facility] Pager [phone] 03/29/2012, 9:21 AM    LOS: 2 days      

## 2012-03-29 NOTE — H&P (View-Only) (Signed)
PROGRESS NOTE  [name] JXB:[id number] DOB: 04/04/1945 DOA: 03/27/2012 PCP: No primary provider on file.  Brief narrative: 67 yr old VN male admitted 03/27/12 with possible GIB.  Past medical history-As per Problem list Chart reviewed as below-  Seen at [facility] 12/22 for mouth ulcers? Given Magic mouthwash for this  Seen at ED 12/25 for intermittent Rectal bleeding-bbaseline Hb iin 2011 was 13.5  Seen [facility] 01/02/12 for cough  Seen in [facility] office 10/24/2009 for Elevate dPSA  Seen by NS Dr Danielle Dess 07/02/08 for Chroic SUbdural hematoma-refused surgery at that time  NOted to have chronic rectal bleeding per 09/23/09 note-thought to be consitpation related  Consultants:  GI  Procedures:  None currently  Antibiotics:  none   Subjective  Interviewed c telephone interpreter Patient states he feels much better.  No specific issues-very happy his stool is neg for blood today and rejoicing Having stool at time seen scheduled for flex sigmoidoscopy today   Objective    Interim History: NAD  Telemetry: Sinus rhythm, HR's occasionally in the 120's  Objective: Filed Vitals:   03/28/12 1432 03/28/12 2100 03/28/12 2209 03/29/12 0447  BP: 100/57 82/56 99/53  98/57  Pulse: 70 74 60 79  Temp: 97.1 F (36.2 C) 98.1 F (36.7 C)  97.9 F (36.6 C)  TempSrc: Oral Oral  Oral  Resp: 16 18 18 19   Height:      Weight:    52.481 kg (115 lb 11.2 oz)  SpO2: 98% 100%  98%    Intake/Output Summary (Last 24 hours) at 03/29/12 1308 Last data filed at 03/29/12 0805  Gross per 24 hour  Intake 3152.5 ml  Output   3876 ml  Net -723.5 ml    Exam:  General: Alert pleasant VM in nad Cardiovascular: s1 s2 no m/r/g Respiratory: clinically clear, no added sound Abdomen: soft, NT/ND Skinnad Neuro grossly intct  Data Reviewed: Basic Metabolic Panel:  Lab 03/28/12 6578 03/27/12 1345  NA 140 139  K 3.8 4.4  CL 107 104  CO2 26 27  GLUCOSE 81 90  BUN 13 17  CREATININE 1.00 1.14    CALCIUM 8.7 9.5  MG -- --  PHOS -- --   Liver Function Tests:  Lab 03/27/12 1345  AST 25  ALT 25  ALKPHOS 57  BILITOT 0.3  PROT 6.9  ALBUMIN 3.5   No results found for this basename: LIPASE:5,AMYLASE:5 in the last 168 hours No results found for this basename: AMMONIA:5 in the last 168 hours CBC:  Lab 03/29/12 0125 03/28/12 1926 03/28/12 1336 03/28/12 1011 03/28/12 0133  WBC 4.6 3.5* 4.5 5.0 3.4*  NEUTROABS -- -- -- -- --  HGB 11.1* 11.0* 11.5* 11.5* 11.0*  HCT 31.7* 31.6* 33.6* 32.6* 31.6*  MCV 94.9 94.9 96.0 95.0 94.3  PLT 185 194 192 220 184   Cardiac Enzymes:  Lab 03/27/12 1345  CKTOTAL --  CKMB --  CKMBINDEX --  TROPONINI <0.30   BNP: No components found with this basename: POCBNP:5 CBG: No results found for this basename: GLUCAP:5 in the last 168 hours  No results found for this or any previous visit (from the past 240 hour(s)).   Studies:              All Imaging reviewed and is as per above notation   Scheduled Meds:    . feeding supplement  1 Container Oral BID BM   Continuous Infusions:    . sodium chloride 75 mL/hr at 03/29/12 [phone]  Assessment/Plan: 1. Likely slow either localized bleed vs Proctitis-Patient's Hb stable.  For Flex sig today.  Ivf 75cc /hour to continue.  Clear liquid diet-Per GI 2. Hypotension-unlcear if constitutional or not.  Cont IVF.  Orthostatics pending still 3. H/o Prostate cancer IMRT 7/17-->11/26/10, Gleason 6, PSA 6.01-PSa in 03/23/12=0.96-stable-outpatient follow up 4. ? H/o Sleep apneoa 5. 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 HLd-not on Meds consider at d/c home  Code Status: PResuemd full Family Communication:  Spoke with daughter in detail-will troy to get interpreter to help with some further questions Disposition Plan: Obs tm 8, per GI   Pleas Koch, MD  [facility] Pager [phone] 03/29/2012, 9:21 AM    LOS: 2 days

## 2012-03-29 NOTE — Progress Notes (Addendum)
Sigmoidoscopy demonstrated large internal hemorrhoids. This is very likely the bleeding source.  Recommendations #1 Anusol HC suppositories #2 given severity of bleeding would consider proceeding with band ligation.  Will discuss with pt.

## 2012-03-30 ENCOUNTER — Encounter (HOSPITAL_COMMUNITY): Payer: Self-pay | Admitting: Gastroenterology

## 2012-03-30 DIAGNOSIS — R972 Elevated prostate specific antigen [PSA]: Secondary | ICD-10-CM

## 2012-03-30 DIAGNOSIS — R51 Headache: Secondary | ICD-10-CM

## 2012-03-30 LAB — CBC
HCT: 33.9 % — ABNORMAL LOW (ref 39.0–52.0)
HCT: 30.3 % — ABNORMAL LOW (ref 39.0–52.0)
HCT: 33.8 % — ABNORMAL LOW (ref 39.0–52.0)
Hemoglobin: 11.8 g/dL — ABNORMAL LOW (ref 13.0–17.0)
Hemoglobin: 10.6 g/dL — ABNORMAL LOW (ref 13.0–17.0)
Hemoglobin: 12 g/dL — ABNORMAL LOW (ref 13.0–17.0)
MCH: 33.9 pg (ref 26.0–34.0)
MCH: 33.4 pg (ref 26.0–34.0)
MCHC: 35.5 g/dL (ref 30.0–36.0)
MCHC: 35.1 g/dL (ref 30.0–36.0)
MCV: 95.9 fL (ref 78.0–100.0)
MCV: 95.5 fL (ref 78.0–100.0)
MCV: 95.1 fL (ref 78.0–100.0)
Platelets: 188 10*3/uL (ref 150–400)
Platelets: 199 10*3/uL (ref 150–400)
RBC: 3.54 MIL/uL — ABNORMAL LOW (ref 4.22–5.81)
RBC: 3.16 MIL/uL — ABNORMAL LOW (ref 4.22–5.81)
RBC: 3.5 MIL/uL — ABNORMAL LOW (ref 4.22–5.81)
WBC: 5 10*3/uL (ref 4.0–10.5)
WBC: 4.3 10*3/uL (ref 4.0–10.5)

## 2012-03-30 MED ORDER — SODIUM CHLORIDE 0.45 % IV SOLN: INTRAVENOUS | Status: DC

## 2012-03-30 MED ORDER — SODIUM CHLORIDE 0.9 % IV SOLN
INTRAVENOUS | Status: AC
  Administered 2012-03-30 – 2012-03-31 (×2): via INTRAVENOUS

## 2012-03-30 NOTE — Progress Notes (Signed)
Via an interpretor over the phone, pt states he has had a bm this am with no sign of blood.

## 2012-03-30 NOTE — Progress Notes (Signed)
[facility]                                                                                Patient Demographics  [name], is a 67 y.o. male  ZOX:[id number]  [id number]  DOB - [birth year]  Admit date - 03/27/2012  Admitting Physician Standley Brooking, MD  Outpatient Primary MD for the patient is No primary provider on file.  LOS - 3   Chief Complaint  Patient presents with  . Rectal Bleeding        Assessment & Plan    1. LGI Acute bleed with hypotension and syncope - Likely from large internal hemorrhoid noted on sigmoidoscopy done 03/29/2012 . GI following the patient likely will need banding, continue IV fluids and monitoring H&H.   2.  Hypotension with syncope -  Likely from #1 above, we'll continue to hydrate, expect dilutional fall and H&H, check TSH and cortisol.   3. History of Prostate cancer IMRT 7/17-->11/26/10, Gleason 6, PSA 6.01-PSa in 03/23/12=0.96-stable-outpatient follow up    4.  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   Code Status: full  Family Communication: pt  Disposition Plan: home   Procedures  Sigmoidoscopy demonstrated large internal hemorrhoids.      Consults GI   DVT Prophylaxis   SCDs   Lab Results  Component Value Date   PLT 188 03/30/2012    Medications  Scheduled Meds:   . feeding supplement  1 Container Oral BID BM  . hydrocortisone  25 mg Rectal BID   Continuous Infusions:  PRN Meds:.acetaminophen, acetaminophen, ondansetron (ZOFRAN) IV, ondansetron  Antibiotics     Anti-infectives    None       Time Spent in minutes   30   Susa Raring K M.D on 03/30/2012 at 11:16 AM  Between 7am to 7pm - Pager - [phone]  After 7pm go to [email or web address] - password [email or web address]  And look for the night coverage person covering for me after hours  [facility] Office  [phone]    Subjective:   [name] today has, No headache, No chest pain, No  abdominal pain - No Nausea, No new weakness tingling or numbness, No Cough - SOB. No blood in BM this am.  Objective:   Filed Vitals:   03/29/12 1540 03/29/12 1550 03/29/12 2101 03/30/12 0438  BP: 91/62 96/63 100/68 99/71  Pulse:   59 63  Temp:   98.1 F (36.7 C) 98.2 F (36.8 C)  TempSrc:   Oral Oral  Resp: 12 12 14 16   Height:      Weight:    51.6 kg (113 lb 12.1 oz)  SpO2: 100% 100% 100% 100%    Wt Readings from Last 3 Encounters:  03/30/12 51.6 kg (113 lb 12.1 oz)  03/30/12 51.6 kg (113 lb 12.1 oz)  07/04/10 55.792 kg (123 lb)     Intake/Output Summary (Last 24 hours) at 03/30/12 1116 Last data filed at 03/30/12 0900  Gross per 24 hour  Intake    960 ml  Output    800 ml  Net    160 ml    Exam Awake Alert,  Oriented X 3, No new F.N deficits, Normal affect [place].AT,PERRAL Supple Neck,No JVD, No cervical lymphadenopathy appriciated.  Symmetrical Chest wall movement, Good air movement bilaterally, CTAB RRR,No Gallops,Rubs or new Murmurs, No Parasternal Heave +ve B.Sounds, Abd Soft, Non tender, No organomegaly appriciated, No rebound - guarding or rigidity. No Cyanosis, Clubbing or edema, No new Rash or bruise    Data Review   Micro Results No results found for this or any previous visit (from the past 240 hour(s)).  Radiology Reports Dg Chest 2 View  03/06/2012  *RADIOLOGY REPORT*  Clinical Data: Fatigue, weight loss  CHEST - 2 VIEW  Comparison: None  Findings: Normal heart size, mediastinal contours, and pulmonary vascularity. Atherosclerotic calcification aorta. Lungs clear. No pleural effusion or pneumothorax. Old right rib fractures.  IMPRESSION: No acute abnormalities.   Original Report Authenticated By: Ulyses Southward, M.D.     CBC  Lab 03/30/12 0645 03/30/12 0031 03/29/12 1910 03/29/12 0808 03/29/12 0125  WBC 4.3 5.0 4.4 4.8 4.6  HGB 10.6* 11.8* 12.0* 11.7* 11.1*  HCT 30.3* 33.9* 34.4* 33.9* 31.7*  PLT 188 208 204 206 185  MCV 95.9 95.8 95.8 95.8 94.9    MCH 33.5 33.3 33.4 33.1 33.2  MCHC 35.0 34.8 34.9 34.5 35.0  RDW 11.7 11.5 11.7 11.9 11.8  LYMPHSABS -- -- -- -- --  MONOABS -- -- -- -- --  EOSABS -- -- -- -- --  BASOSABS -- -- -- -- --  BANDABS -- -- -- -- --    Chemistries   Lab 03/28/12 0133 03/27/12 1345  NA 140 139  K 3.8 4.4  CL 107 104  CO2 26 27  GLUCOSE 81 90  BUN 13 17  CREATININE 1.00 1.14  CALCIUM 8.7 9.5  MG -- --  AST -- 25  ALT -- 25  ALKPHOS -- 57  BILITOT -- 0.3   ------------------------------------------------------------------------------------------------------------------ estimated creatinine clearance is 54.5 ml/min (by C-G formula based on Cr of 1). ------------------------------------------------------------------------------------------------------------------ No results found for this basename: HGBA1C:2 in the last 72 hours ------------------------------------------------------------------------------------------------------------------ No results found for this basename: CHOL:2,HDL:2,LDLCALC:2,TRIG:2,CHOLHDL:2,LDLDIRECT:2 in the last 72 hours ------------------------------------------------------------------------------------------------------------------ No results found for this basename: TSH,T4TOTAL,FREET3,T3FREE,THYROIDAB in the last 72 hours ------------------------------------------------------------------------------------------------------------------ No results found for this basename: VITAMINB12:2,FOLATE:2,FERRITIN:2,TIBC:2,IRON:2,RETICCTPCT:2 in the last 72 hours  Coagulation profile  Lab 03/27/12 1352  INR 1.00  PROTIME --    No results found for this basename: DDIMER:2 in the last 72 hours  Cardiac Enzymes  Lab 03/27/12 1345  CKMB --  TROPONINI <0.30  MYOGLOBIN --   ------------------------------------------------------------------------------------------------------------------ No components found with this basename: POCBNP:3

## 2012-03-30 NOTE — Progress Notes (Signed)
[facility] Progress Note  SUBJECTIVE: spoke through interpreter, feels okay today, no bleeding  OBJECTIVE:  Vital signs in last 24 hours: Temp:  [97.7 F (36.5 C)-98.2 F (36.8 C)] 98.2 F (36.8 C) (01/15 0438) Pulse Rate:  [59-63] 63  (01/15 0438) Resp:  [11-18] 16  (01/15 0438) BP: (89-110)/(55-75) 99/71 mmHg (01/15 0438) SpO2:  [98 %-100 %] 100 % (01/15 0438) Weight:  [113 lb 12.1 oz (51.6 kg)] 113 lb 12.1 oz (51.6 kg) (01/15 0438) Last BM Date: 03/29/12 General:    Asian male in NAD Heart:  Regular rate and rhythm Abdomen:  Soft, nontender and nondistended. Normal bowel sounds. Extremities:  Without edema. Neurologic:  Alert and oriented,  grossly normal neurologically. Psych:  Cooperative. Normal mood and affect.   Lab Results:  Basename 03/30/12 0645 03/30/12 0031 03/29/12 1910  WBC 4.3 5.0 4.4  HGB 10.6* 11.8* 12.0*  HCT 30.3* 33.9* 34.4*  PLT 188 208 204   BMET  Basename 03/28/12 0133 03/27/12 1345  NA 140 139  K 3.8 4.4  CL 107 104  CO2 26 27  GLUCOSE 81 90  BUN 13 17  CREATININE 1.00 1.14  CALCIUM 8.7 9.5   LFT  Basename 03/27/12 1345  PROT 6.9  ALBUMIN 3.5  AST 25  ALT 25  ALKPHOS 57  BILITOT 0.3  BILIDIR --  IBILI --   PT/INR  Basename 03/27/12 1352  LABPROT 13.1  INR 1.00      ASSESSMENT / PLAN:   Hemorrhoidal bleeding.   We recommend banding. Spoke with patient through interpreter with "Language [employer]". Explained procedure, patient agreeable. Questions answered.    LOS: 3 days   Paula Guenther  03/30/2012, 9:31 AM  I have personally taken an interval history, reviewed the chart, and examined the patient.  I agree with the extender's note, impression and recommendations.  Robert D. Kaplan, MD, FACG [facility] [phone]     

## 2012-03-31 ENCOUNTER — Encounter (HOSPITAL_COMMUNITY)
Admission: EM | Disposition: A | Discharge status: Home / Self Care | Payer: Self-pay | Source: Home / Self Care | Attending: Family Medicine

## 2012-03-31 ENCOUNTER — Encounter (HOSPITAL_COMMUNITY): Payer: Self-pay | Admitting: *Deleted

## 2012-03-31 DIAGNOSIS — K648 Other hemorrhoids: Secondary | ICD-10-CM

## 2012-03-31 HISTORY — PX: FLEXIBLE SIGMOIDOSCOPY: SHX5431

## 2012-03-31 HISTORY — PX: HEMORRHOID BANDING: SHX5850

## 2012-03-31 LAB — BASIC METABOLIC PANEL
BUN: 15 mg/dL (ref 6–23)
Creatinine, Ser: 1 mg/dL (ref 0.50–1.35)
GFR calc non Af Amer: 78 mL/min — ABNORMAL LOW (ref >90)
Glucose, Bld: 121 mg/dL — ABNORMAL HIGH (ref 70–99)
Potassium: 3.8 mEq/L (ref 3.5–5.1)

## 2012-03-31 LAB — CBC
MCH: 33.4 pg (ref 26.0–34.0)
MCV: 94.9 fL (ref 78.0–100.0)
Platelets: 173 10*3/uL (ref 150–400)
RBC: 3.14 MIL/uL — ABNORMAL LOW (ref 4.22–5.81)

## 2012-03-31 SURGERY — SIGMOIDOSCOPY, FLEXIBLE
Anesthesia: Moderate Sedation

## 2012-03-31 MED ORDER — DOCUSATE SODIUM 100 MG PO CAPS: 100.0000 mg | ORAL_CAPSULE | Freq: Two times a day (BID) | ORAL | Status: DC

## 2012-03-31 MED ORDER — FENTANYL CITRATE 0.05 MG/ML IJ SOLN
INTRAMUSCULAR | Status: AC
  Filled 2012-03-31: qty 4

## 2012-03-31 MED ORDER — HYDROCORTISONE ACETATE 25 MG RE SUPP: 25.0000 mg | Freq: Two times a day (BID) | RECTAL | Status: DC

## 2012-03-31 MED ORDER — ACETAMINOPHEN 325 MG PO TABS: 650.0000 mg | ORAL_TABLET | ORAL | Status: DC | PRN

## 2012-03-31 MED ORDER — MIDAZOLAM HCL 5 MG/ML IJ SOLN
INTRAMUSCULAR | Status: AC
  Filled 2012-03-31: qty 3

## 2012-03-31 MED ORDER — MIDAZOLAM HCL 10 MG/2ML IJ SOLN
INTRAMUSCULAR | Status: DC | PRN
  Administered 2012-03-31 (×2): 2 mg via INTRAVENOUS

## 2012-03-31 MED ORDER — FENTANYL CITRATE 0.05 MG/ML IJ SOLN
INTRAMUSCULAR | Status: DC | PRN
  Administered 2012-03-31 (×2): 25 ug via INTRAVENOUS

## 2012-03-31 NOTE — Progress Notes (Signed)
Received from endoscopy, vital signs stable.  No active bleeding noted.  Alert and cooperative.

## 2012-03-31 NOTE — Op Note (Signed)
[name] [address] [place], [address]   FLEXIBLE SIGMOIDOSCOPY PROCEDURE REPORT  PATIENT: [name], [name]  MR#: [id number] BIRTHDATE: October 25, 1945 , 66  yrs. old GENDER: Male ENDOSCOPIST: Louis Meckel, MD REFERRED BY: Etta Grandchild, M.D. PROCEDURE DATE:  03/31/2012 PROCEDURE:   Hemorrhoidectomy via banding, clips or ligation ASA CLASS:   Class II INDICATIONS:therapy of for previously diagnosed hemorrhoids. MEDICATIONS: These medications were titrated to patient response per physician's verbal order, Versed 4 mg IV, and Fentanyl 50 mcg IV  DESCRIPTION OF PROCEDURE:   After the risks benefits and alternatives of the procedure were thoroughly explained, informed consent was obtained.  revealed no abnormalities of the rectum. The [id number] ([id number])  endoscope was introduced through the anus  and advanced to the sigmoid colon , limited by No adverse events experienced.   The quality of the prep was    .  The instrument was then slowly withdrawn as the mucosa was fully examined.       1.  COLON FINDINGS: 3 large, friable hemorrhoidal bundles. 4 bands were placed just above the dentate line encorporating the hemorrhoidal bundles. 2.  3 large, friable hemorrhoidal bundles. 4 bands were placed just above the dentate line encorporating the hemorrhoidal bundles. Retroflexed views revealed internal hemorrhoid.    The scope was then withdrawn from the patient and the procedure terminated.  COMPLICATIONS: There were no complications.  ENDOSCOPIC IMPRESSION: 1.   bleeding internal hemorrhoids - s/p band ligation  RECOMMENDATIONS: warm soaks  REPEAT EXAM:   _______________________________ eSignedLouis Meckel, MD 03/31/2012 8:58 AM   CC:

## 2012-03-31 NOTE — Interval H&P Note (Signed)
History and Physical Interval Note:  03/31/2012 8:34 AM  [name]  has presented today for surgery, with the diagnosis of rectal bleeding  The various methods of treatment have been discussed with the patient and family. After consideration of risks, benefits and other options for treatment, the patient has consented to  Procedure(s) (LRB) with comments: FLEXIBLE SIGMOIDOSCOPY (N/A) HEMORRHOID BANDING (N/A) as a surgical intervention .  The patient's history has been reviewed, patient examined, no change in status, stable for surgery.  I have reviewed the patient's chart and labs.  Questions were answered to the patient's satisfaction.     The recent H&P (dated *03/30/12**) was reviewed, the patient was examined and there is no change in the patients condition since that H&P was completed.   Melvia Heaps  03/31/2012, 8:35 AM   Melvia Heaps

## 2012-03-31 NOTE — Discharge Summary (Signed)
[facility]                                                                                   [name], is a 67 y.o. male  DOB 1945-05-03  MRN [id number].  Admission date:  03/27/2012  Discharge Date:  03/31/2012  Primary MD  No primary provider on file.  Admitting Physician  Standley Brooking, MD  Admission Diagnosis  Syncope [780.2] Rectal bleeding [569.3] Orthostasis [458.0] gi bleed bleeding per rectum rectal bleeding  Discharge Diagnosis     Principal Problem:  *Rectal bleeding Active Problems:  Syncope  Internal bleeding hemorrhoids  Internal hemorrhoids without mention of complication       Past Medical History  Diagnosis Date  . Chronic headaches   . Subdural hematoma, chronic   . Anxiety   . Depression   . Prostate cancer     receiving radiation treatment  . Adenomatous polyp of colon 08/2010    4 polyps removed at colonoscopy by Dr Marina Goodell  . Diverticulosis of colon 08/2010  . Internal hemorrhoids 08/2010    Past Surgical History  Procedure Date  . Shrapnel removal     skull during [name] War  . Flexible sigmoidoscopy 03/29/2012    Procedure: FLEXIBLE SIGMOIDOSCOPY;  Surgeon: Louis Meckel, MD;  [facility] ENDOSCOPY;  Service: Endoscopy;  Laterality: N/A;  possibly may do colonoscopy but prep is only for a flex     Recommendations for primary care physician for things to follow:   Please follow patient's H&H closely   Discharge Diagnoses:   Principal Problem:  *Rectal bleeding Active Problems:  Syncope  Internal bleeding hemorrhoids  Internal hemorrhoids without mention of complication    Discharge Condition: Stable   Diet recommendation: See Discharge Instructions below   Consults GI Dr. Arlyce Dice. Patient underwent sigmoidoscopy thereafter internal hemorrhoid banding   History of present illness and  Hospital Course:  See H&P, Labs, Consult and Test reports for all details in brief, patient was admitted for lower  GI bleed due to internal hemorrhoidal bleeding, causing orthostasis and syncope on the day of admission, patient treated with supportive care with resolution of his symptoms, blood pressure now stable not orthostatic a syncopal, stable on telemetry, was seen by GI physician Dr. Arlyce Dice and underwent successful internal hemorrhoid banding x3 on 03/31/2012, no further bleeding per rectum, discussed with Dr. Arlyce Dice stable to go home on stool softeners along with Anusol suppository and warm water soaks to the area.   Vision has history of prostate cancer for which he will follow with his urologist and primary care physician.      Today   Subjective:   [name] today has no headache,no chest abdominal pain,no new weakness tingling or numbness, feels much better wants to go home today. Most become indicated via sign language.   Objective:   Blood pressure 110/68, pulse 67, temperature 97.9 F (36.6 C), temperature source Oral, resp. rate 22, height 5\' 5"  (1.651 m), weight 50.3 kg (110 lb 14.3 oz), SpO2 100.00%.   Intake/Output Summary (Last 24 hours) at 03/31/12 1003 Last data filed at 03/31/12 0600  Gross per 24 hour  Intake  530 ml  Output   1275 ml  Net   -745 ml    Exam Awake Alert, Oriented *3, No new F.N deficits, Normal affect [place].AT,PERRAL Supple Neck,No JVD, No cervical lymphadenopathy appriciated.  Symmetrical Chest wall movement, Good air movement bilaterally, CTAB RRR,No Gallops,Rubs or new Murmurs, No Parasternal Heave +ve B.Sounds, Abd Soft, Non tender, No organomegaly appriciated, No rebound -guarding or rigidity. No Cyanosis, Clubbing or edema, No new Rash or bruise  Data Review   Major procedures and Radiology Reports - PLEASE review detailed and final reports for all details in brief -    Internal hemorrhoid banding by Dr. Arlyce Dice done on 03/31/2012   Dg Chest 2 View  03/06/2012  *RADIOLOGY REPORT*  Clinical Data: Fatigue, weight loss  CHEST - 2 VIEW   Comparison: None  Findings: Normal heart size, mediastinal contours, and pulmonary vascularity. Atherosclerotic calcification aorta. Lungs clear. No pleural effusion or pneumothorax. Old right rib fractures.  IMPRESSION: No acute abnormalities.   Original Report Authenticated By: Ulyses Southward, M.D.     Micro Results      No results found for this or any previous visit (from the past 240 hour(s)).   CBC w Diff: Lab Results  Component Value Date   WBC 4.3 03/31/2012   HGB 10.5* 03/31/2012   HCT 29.8* 03/31/2012   PLT 173 03/31/2012   LYMPHOPCT 35 03/09/2012   MONOPCT 6 03/09/2012   EOSPCT 1 03/09/2012   BASOPCT 1 03/09/2012    CMP: Lab Results  Component Value Date   NA 141 03/31/2012   K 3.8 03/31/2012   CL 111 03/31/2012   CO2 21 03/31/2012   BUN 15 03/31/2012   CREATININE 1.00 03/31/2012   PROT 6.9 03/27/2012   ALBUMIN 3.5 03/27/2012   BILITOT 0.3 03/27/2012   ALKPHOS 57 03/27/2012   AST 25 03/27/2012   ALT 25 03/27/2012  .   Discharge Instructions      Follow with Primary MD  in 7 days , lukewarm water soaks to the rectal area  Get CBC, CMP, checked 7 days by Primary MD and again as instructed by your Primary MD.    Get Medicines reviewed and adjusted.  Please request your Prim.MD to go over all Hospital Tests and Procedure/Radiological results at the follow up, please get all Hospital records sent to your Prim MD by signing hospital release before you go home.  Activity: As tolerated with Full fall precautions use walker/cane & assistance as needed   Diet:  Heart Healthy  For Heart failure patients - Check your Weight same time everyday, if you gain over 2 pounds, or you develop in leg swelling, experience more shortness of breath or chest pain, call your Primary MD immediately. Follow Cardiac Low Salt Diet and 1.8 lit/day fluid restriction.  Disposition Home    If you experience worsening of your admission symptoms, develop shortness of breath, life threatening emergency,  suicidal or homicidal thoughts you must seek medical attention immediately by calling 911 or calling your MD immediately  if symptoms less severe.  You Must read complete instructions/literature along with all the possible adverse reactions/side effects for all the Medicines you take and that have been prescribed to you. Take any new Medicines after you have completely understood and accpet all the possible adverse reactions/side effects.   Do not drive and provide baby sitting services if your were admitted for syncope or siezures until you have seen by Primary MD or a Neurologist and advised to do  so again.  Do not drive when taking Pain medications.    Do not take more than prescribed Pain, Sleep and Anxiety Medications  Special Instructions: If you have smoked or chewed Tobacco  in the last 2 yrs please stop smoking, stop any regular Alcohol  and or any Recreational drug use.  Wear Seat belts while driving.   Follow-up Information    Follow up with Melvia Heaps, MD. Schedule an appointment as soon as possible for a visit in 1 week.   Contact information:   [address] [place] [address] [phone]       Follow up with Your Primary MD. Schedule an appointment as soon as possible for a visit in 1 week.           Discharge Medications     Medication List     As of 03/31/2012 10:03 AM    START taking these medications         docusate sodium 100 MG capsule   Commonly known as: COLACE   Take 1 capsule (100 mg total) by mouth 2 (two) times daily.      hydrocortisone 25 MG suppository   Commonly known as: ANUSOL-HC   Place 1 suppository (25 mg total) rectally 2 (two) times daily.          Where to get your medications    These are the prescriptions that you need to pick up.   You may get these medications from any pharmacy.         docusate sodium 100 MG capsule   hydrocortisone 25 MG suppository               Total Time  in preparing paper work, data evaluation and todays exam - 35 minutes  Leroy Sea M.D on 03/31/2012 at 10:03 AM  [facility] Office  [phone]

## 2012-03-31 NOTE — Progress Notes (Signed)
Patient was discharged after [place] speaking relative came.  Both were able to verbalize understanding of discharge instructions.  No active bleeding noted.

## 2012-03-31 NOTE — H&P (View-Only) (Signed)
[facility] Progress Note  SUBJECTIVE: spoke through interpreter, feels okay today, no bleeding  OBJECTIVE:  Vital signs in last 24 hours: Temp:  [97.7 F (36.5 C)-98.2 F (36.8 C)] 98.2 F (36.8 C) (01/15 0438) Pulse Rate:  [59-63] 63  (01/15 0438) Resp:  [11-18] 16  (01/15 0438) BP: (89-110)/(55-75) 99/71 mmHg (01/15 0438) SpO2:  [98 %-100 %] 100 % (01/15 0438) Weight:  [113 lb 12.1 oz (51.6 kg)] 113 lb 12.1 oz (51.6 kg) (01/15 0438) Last BM Date: 03/29/12 General:    Asian male in NAD Heart:  Regular rate and rhythm Abdomen:  Soft, nontender and nondistended. Normal bowel sounds. Extremities:  Without edema. Neurologic:  Alert and oriented,  grossly normal neurologically. Psych:  Cooperative. Normal mood and affect.   Lab Results:  Basename 03/30/12 0645 03/30/12 0031 03/29/12 1910  WBC 4.3 5.0 4.4  HGB 10.6* 11.8* 12.0*  HCT 30.3* 33.9* 34.4*  PLT 188 208 204   BMET  Basename 03/28/12 0133 03/27/12 1345  NA 140 139  K 3.8 4.4  CL 107 104  CO2 26 27  GLUCOSE 81 90  BUN 13 17  CREATININE 1.00 1.14  CALCIUM 8.7 9.5   LFT  Basename 03/27/12 1345  PROT 6.9  ALBUMIN 3.5  AST 25  ALT 25  ALKPHOS 57  BILITOT 0.3  BILIDIR --  IBILI --   PT/INR  Basename 03/27/12 1352  LABPROT 13.1  INR 1.00      ASSESSMENT / PLAN:   Hemorrhoidal bleeding.   We recommend banding. Spoke with patient through interpreter with "Language [employer]". Explained procedure, patient agreeable. Questions answered.    LOS: 3 days   Willette Cluster  03/30/2012, 9:31 AM  I have personally taken an interval history, reviewed the chart, and examined the patient.  I agree with the extender's note, impression and recommendations.  Barbette Hair. Arlyce Dice, MD, [facility] [phone]

## 2012-04-01 ENCOUNTER — Encounter (HOSPITAL_COMMUNITY): Payer: Self-pay | Admitting: Gastroenterology

## 2012-08-30 ENCOUNTER — Other Ambulatory Visit: Payer: Self-pay | Admitting: Infectious Diseases

## 2012-08-30 ENCOUNTER — Ambulatory Visit
Admission: RE | Admit: 2012-08-30 | Discharge: 2012-08-30 | Disposition: A | Discharge status: Home / Self Care | Payer: No Typology Code available for payment source | Source: Ambulatory Visit | Attending: Infectious Diseases | Admitting: Infectious Diseases

## 2012-08-30 DIAGNOSIS — A159 Respiratory tuberculosis unspecified: Secondary | ICD-10-CM

## 2012-09-14 ENCOUNTER — Ambulatory Visit: Payer: Medicaid Other | Attending: Family Medicine | Admitting: Internal Medicine

## 2012-09-14 VITALS — BP 117/76 | HR 74 | Temp 98.6°F | Resp 16 | Ht 62.0 in | Wt 124.0 lb

## 2012-09-14 DIAGNOSIS — R05 Cough: Secondary | ICD-10-CM | POA: Insufficient documentation

## 2012-09-14 DIAGNOSIS — E7800 Pure hypercholesterolemia, unspecified: Secondary | ICD-10-CM | POA: Insufficient documentation

## 2012-09-14 DIAGNOSIS — R059 Cough, unspecified: Secondary | ICD-10-CM

## 2012-09-14 DIAGNOSIS — Z8546 Personal history of malignant neoplasm of prostate: Secondary | ICD-10-CM | POA: Insufficient documentation

## 2012-09-14 DIAGNOSIS — R972 Elevated prostate specific antigen [PSA]: Secondary | ICD-10-CM | POA: Insufficient documentation

## 2012-09-14 DIAGNOSIS — R0602 Shortness of breath: Secondary | ICD-10-CM | POA: Insufficient documentation

## 2012-09-14 MED ORDER — ALBUTEROL SULFATE HFA 108 (90 BASE) MCG/ACT IN AERS: 2.0000 | INHALATION_SPRAY | Freq: Four times a day (QID) | RESPIRATORY_TRACT | Status: DC | PRN

## 2012-09-14 NOTE — Progress Notes (Signed)
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

## 2012-09-14 NOTE — Progress Notes (Signed)
Patient ID: [name], male   DOB: [birth year], 66 y.o.   MRN: [id number]  CC:  HPI:  67 year old male who presents to the clinic for evaluation of a cough. The patient's significant other who speaks English states that patient has a very mucopurulent productive cough. He's had it for the last year. The patient has to use a container to spit when he is at her house. He was seen in urgent care by Dr. Lerry Liner who did a PPD that turned out to be positive. Apparently also did a chest x-ray that was negative and this was [place] and [place]. The patient denies any fever any night sweats any recent weight loss. He denies any recent history of travel He was last seen at [facility] when he had bleeding hemorrhoids and underwent banding  No Known Allergies Past Medical History  Diagnosis Date  . Chronic headaches   . Subdural hematoma, chronic   . Anxiety   . Depression   . Prostate cancer     receiving radiation treatment  . Adenomatous polyp of colon 08/2010    4 polyps removed at colonoscopy by Dr Marina Goodell  . Diverticulosis of colon 08/2010  . Internal hemorrhoids 08/2010  . Tuberculosis    Current Outpatient Prescriptions on File Prior to Visit  Medication Sig Dispense Refill  . docusate sodium (COLACE) 100 MG capsule Take 1 capsule (100 mg total) by mouth 2 (two) times daily.  20 capsule  0  . hydrocortisone (ANUSOL-HC) 25 MG suppository Place 1 suppository (25 mg total) rectally 2 (two) times daily.  30 suppository  1   No current facility-administered medications on file prior to visit.   History reviewed. No pertinent family history. History   Social History  . Marital Status: Single    Spouse Name: N/A    Number of Children: 2  . Years of Education: N/A   Occupational History  . Disabled    Social History Main Topics  . Smoking status: Former Games developer  . Smokeless tobacco: Never Used  . Alcohol Use: No     Comment: 1 beer daily  . Drug Use: No  . Sexually Active: Not  Currently   Other Topics Concern  . Not on file   Social History Narrative   ** Merged History Encounter **        Review of Systems  Constitutional: Negative for fever, chills, diaphoresis, activity change, appetite change and fatigue.  HENT: Negative for ear pain, nosebleeds, congestion, facial swelling, rhinorrhea, neck pain, neck stiffness and ear discharge.   Eyes: Negative for pain, discharge, redness, itching and visual disturbance.  Respiratory: Negative for cough, choking, chest tightness, shortness of breath, wheezing and stridor.   Cardiovascular: Negative for chest pain, palpitations and leg swelling.  Gastrointestinal: Negative for abdominal distention.  Genitourinary: Negative for dysuria, urgency, frequency, hematuria, flank pain, decreased urine volume, difficulty urinating and dyspareunia.  Musculoskeletal: Negative for back pain, joint swelling, arthralgias and gait problem.  Neurological: Negative for dizziness, tremors, seizures, syncope, facial asymmetry, speech difficulty, weakness, light-headedness, numbness and headaches.  Hematological: Negative for adenopathy. Does not bruise/bleed easily.  Psychiatric/Behavioral: Negative for hallucinations, behavioral problems, confusion, dysphoric mood, decreased concentration and agitation.    Objective:   Filed Vitals:   09/14/12 1712  BP: 117/76  Pulse: 74  Temp: 98.6 F (37 C)  Resp: 16    Physical Exam  Constitutional: Appears well-developed and well-nourished. No distress.  HENT: Normocephalic. External right and left ear normal. Oropharynx  is clear and moist.  Eyes: Conjunctivae and EOM are normal. PERRLA, no scleral icterus.  Neck: Normal ROM. Neck supple. No JVD. No tracheal deviation. No thyromegaly.  CVS: RRR, S1/S2 +, no murmurs, no gallops, no carotid bruit.  Pulmonary: Effort and breath sounds normal, no stridor, rhonchi, wheezes, rales.  Abdominal: Soft. BS +,  no distension, tenderness, rebound or  guarding.  Musculoskeletal: Normal range of motion. No edema and no tenderness.  Lymphadenopathy: No lymphadenopathy noted, cervical, inguinal. Neuro: Alert. Normal reflexes, muscle tone coordination. No cranial nerve deficit. Skin: Skin is warm and dry. No rash noted. Not diaphoretic. No erythema. No pallor.  Psychiatric: Normal mood and affect. Behavior, judgment, thought content normal.   Lab Results  Component Value Date   WBC 4.3 03/31/2012   HGB 10.5* 03/31/2012   HCT 29.8* 03/31/2012   MCV 94.9 03/31/2012   PLT 173 03/31/2012   Lab Results  Component Value Date   CREATININE 1.00 03/31/2012   BUN 15 03/31/2012   NA 141 03/31/2012   K 3.8 03/31/2012   CL 111 03/31/2012   CO2 21 03/31/2012    No results found for this basename: HGBA1C   Lipid Panel     Component Value Date/Time   CHOL 224* 05/15/2010 2044   TRIG 90 05/15/2010 2044   HDL 60 05/15/2010 2044   CHOLHDL 3.7 Ratio 05/15/2010 2044   VLDL 18 05/15/2010 2044   LDLCALC 146* 05/15/2010 2044       Assessment and plan:   Patient Active Problem List   Diagnosis Date Noted  . Internal hemorrhoids without mention of complication 03/31/2012  . Internal bleeding hemorrhoids 03/29/2012  . Rectal bleeding 03/27/2012  . Syncope 03/27/2012  . HYPERCHOLESTEROLEMIA, MILD 05/19/2010  . URINALYSIS, ABNORMAL 05/15/2010  . 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 ELEVATED PROSTATE SPECIFIC ANTIGEN 09/24/2009  . CBC, ABNORMAL 09/24/2009  . SUBDURAL HEMATOMA, CHRONIC 09/23/2009  . CONSTIPATION 09/23/2009  . RECTAL BLEEDING 09/23/2009  . HYPERTROPHY PROSTATE W/UR OBST & OTH LUTS 09/23/2009  . HEADACHE 09/23/2009       Cough Patient had a negative chest x-ray We'll obtain a CT scan to rule out bronchiectasis cavitary lesions QuantiFERON TB test will be repeated We'll obtain a CBC to see the patient has leukocytosis Will obtain AFB culture, both blood and sputum We'll prescribe albuterol for shortness of breath associated with the  cough Followup in one week

## 2012-09-15 ENCOUNTER — Other Ambulatory Visit: Payer: Self-pay | Admitting: Internal Medicine

## 2012-09-15 LAB — CBC WITH DIFFERENTIAL/PLATELET
Basophils Relative: 1 % (ref 0–1)
Eosinophils Absolute: 0.1 10*3/uL (ref 0.0–0.7)
Hemoglobin: 12.4 g/dL — ABNORMAL LOW (ref 13.0–17.0)
MCH: 32.2 pg (ref 26.0–34.0)
MCHC: 34.4 g/dL (ref 30.0–36.0)
Monocytes Absolute: 0.3 10*3/uL (ref 0.1–1.0)
Monocytes Relative: 7 % (ref 3–12)
Neutrophils Relative %: 50 % (ref 43–77)
RDW: 14.3 % (ref 11.5–15.5)

## 2012-09-18 LAB — RESPIRATORY CULTURE OR RESPIRATORY AND SPUTUM CULTURE: Organism ID, Bacteria: NORMAL

## 2012-09-20 ENCOUNTER — Ambulatory Visit: Payer: Medicaid Other

## 2012-09-21 ENCOUNTER — Ambulatory Visit: Payer: Medicaid Other | Attending: Family Medicine | Admitting: Internal Medicine

## 2012-09-21 ENCOUNTER — Telehealth: Payer: Self-pay | Admitting: Family Medicine

## 2012-09-21 VITALS — BP 133/90 | HR 66 | Temp 97.9°F | Resp 15 | Ht 62.0 in | Wt 124.6 lb

## 2012-09-21 DIAGNOSIS — R059 Cough, unspecified: Secondary | ICD-10-CM | POA: Insufficient documentation

## 2012-09-21 DIAGNOSIS — E7800 Pure hypercholesterolemia, unspecified: Secondary | ICD-10-CM | POA: Insufficient documentation

## 2012-09-21 DIAGNOSIS — N400 Enlarged prostate without lower urinary tract symptoms: Secondary | ICD-10-CM | POA: Insufficient documentation

## 2012-09-21 DIAGNOSIS — K140 Glossitis: Secondary | ICD-10-CM | POA: Insufficient documentation

## 2012-09-21 DIAGNOSIS — Z09 Encounter for follow-up examination after completed treatment for conditions other than malignant neoplasm: Secondary | ICD-10-CM | POA: Insufficient documentation

## 2012-09-21 DIAGNOSIS — K648 Other hemorrhoids: Secondary | ICD-10-CM | POA: Insufficient documentation

## 2012-09-21 DIAGNOSIS — Z8546 Personal history of malignant neoplasm of prostate: Secondary | ICD-10-CM | POA: Insufficient documentation

## 2012-09-21 DIAGNOSIS — Z79899 Other long term (current) drug therapy: Secondary | ICD-10-CM | POA: Insufficient documentation

## 2012-09-21 DIAGNOSIS — R05 Cough: Secondary | ICD-10-CM | POA: Insufficient documentation

## 2012-09-21 LAB — HEMOGLOBIN A1C: Mean Plasma Glucose: 100 mg/dL (ref <117)

## 2012-09-21 LAB — LIPID PANEL
HDL: 40 mg/dL (ref >39)
LDL Cholesterol: 117 mg/dL — ABNORMAL HIGH (ref 0–99)
Total CHOL/HDL Ratio: 4.3 Ratio
Triglycerides: 73 mg/dL (ref <150)
VLDL: 15 mg/dL (ref 0–40)

## 2012-09-21 MED ORDER — GUAIFENESIN-DM 100-10 MG/5ML PO SYRP: 5.0000 mL | ORAL_SOLUTION | Freq: Three times a day (TID) | ORAL | Status: DC | PRN

## 2012-09-21 MED ORDER — BENZOCAINE 10 % MT GEL: OROMUCOSAL | Status: DC | PRN

## 2012-09-21 NOTE — Progress Notes (Signed)
Patient here for follow up Still has persistant cough

## 2012-09-21 NOTE — Progress Notes (Signed)
Patient ID: [name], male   DOB: Jul 25, 1945, 67 y.o.   MRN: [id number] Patient Demographics  [name], is a 67 y.o. male  [id number]:[id number]  [id number]  DOB - [birth year]  Chief Complaint  Patient presents with  . Follow-up        Subjective:   [name] with History of prostate cancer follows with a Lyme serology on a regular basis, hemorrhoidal bleed which has now resolved after internal banding by Dr. Arlyce Dice, dementia  is here for followup on a problem of cough which is now resolving, recently had a chest x-ray, quanteferon and sputum cultures which were unremarkable. No fever chills. No shortness of breath.  Denies any subjective complaints except as above, no active headache, no chest abdominal pain at this time, not short of breath. No focal weakness which is new.    Objective:    Patient Active Problem List   Diagnosis Date Noted  . Internal hemorrhoids without mention of complication 03/31/2012  . Internal bleeding hemorrhoids 03/29/2012  . Rectal bleeding 03/27/2012  . Syncope 03/27/2012  . HYPERCHOLESTEROLEMIA, MILD 05/19/2010  . URINALYSIS, ABNORMAL 05/15/2010  . 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 ELEVATED PROSTATE SPECIFIC ANTIGEN 09/24/2009  . CBC, ABNORMAL 09/24/2009  . SUBDURAL HEMATOMA, CHRONIC 09/23/2009  . CONSTIPATION 09/23/2009  . RECTAL BLEEDING 09/23/2009  . HYPERTROPHY PROSTATE W/UR OBST & OTH LUTS 09/23/2009  . HEADACHE 09/23/2009     Filed Vitals:   09/21/12 1507  BP: 133/90  Pulse: 66  Temp: 97.9 F (36.6 C)  Resp: 15  Height: 5\' 2"  (1.575 m)  Weight: 124 lb 9.6 oz (56.518 kg)  SpO2: 99%     Exam  Awake Alert, Oriented X 3, No new F.N deficits, Normal affect [place].AT,PERRAL, small this ulcer on the right lateral aspect of his tongue Supple Neck,No JVD, No cervical lymphadenopathy appriciated.  Symmetrical Chest wall movement, Good air movement bilaterally, CTAB RRR,No Gallops,Rubs or new Murmurs, No Parasternal Heave +ve B.Sounds, Abd  Soft, Non tender, No organomegaly appriciated, No rebound - guarding or rigidity. No Cyanosis, Clubbing or edema, No new Rash or bruise      Data Review   CBC  Recent Labs Lab 09/15/12 1050  WBC 3.9*  HGB 12.4*  HCT 36.0*  PLT 210  MCV 93.5  MCH 32.2  MCHC 34.4  RDW 14.3  LYMPHSABS 1.6  MONOABS 0.3  EOSABS 0.1  BASOSABS 0.0    Chemistries   No results found for this basename: NA, K, CL, CO2, GLUCOSE, BUN, CREATININE, GFRCGP, CALCIUM, MG, AST, ALT, ALKPHOS, BILITOT,  in the last 168 hours ------------------------------------------------------------------------------------------------------------------ No results found for this basename: HGBA1C,  in the last 72 hours ------------------------------------------------------------------------------------------------------------------ No results found for this basename: CHOL, HDL, LDLCALC, TRIG, CHOLHDL, LDLDIRECT,  in the last 72 hours ------------------------------------------------------------------------------------------------------------------ No results found for this basename: TSH, T4TOTAL, FREET3, T3FREE, THYROIDAB,  in the last 72 hours ------------------------------------------------------------------------------------------------------------------ No results found for this basename: VITAMINB12, FOLATE, FERRITIN, TIBC, IRON, RETICCTPCT,  in the last 72 hours  Coagulation profile  No results found for this basename: INR, PROTIME,  in the last 168 hours     Prior to Admission medications   Medication Sig Start Date End Date Taking? Authorizing Provider  albuterol (PROVENTIL HFA;VENTOLIN HFA) 108 (90 BASE) MCG/ACT inhaler Inhale 2 puffs into the lungs every 6 (six) hours as needed for wheezing. 09/14/12   Richarda Overlie, MD  benzocaine (ORAJEL) 10 % mucosal gel Use as directed in the mouth or throat as needed for  pain. 09/21/12   Leroy Sea, MD  docusate sodium (COLACE) 100 MG capsule Take 1 capsule (100 mg total) by  mouth 2 (two) times daily. 03/31/12   Leroy Sea, MD  guaiFENesin-dextromethorphan (ROBITUSSIN DM) 100-10 MG/5ML syrup Take 5 mLs by mouth 3 (three) times daily as needed for cough. 09/21/12   Leroy Sea, MD  hydrocortisone (ANUSOL-HC) 25 MG suppository Place 1 suppository (25 mg total) rectally 2 (two) times daily. 03/31/12   Leroy Sea, MD  memantine (NAMENDA) 5 MG tablet Take 5 mg by mouth 2 (two) times daily.    Historical Provider, MD     Assessment & Plan   Mild cough. Much improved. Recent quanteferon negative, sputum culture unremarkable, afebrile, no leukocytosis, chest x-ray done recently was negative, will give him Robitussin-DM for symptomatic relief.    1 single oval ulcer on the right lateral aspect of his tongue, appears to be of abscess. Oral gel given.   History of prostate cancer he follows with a [facility] urology on a regular basis.     Routine health maintenance. Recent CBC BMP reviewed stable, TSH, A1c and lipid panel ordered   Colonoscopy done in 2012 are unremarkable per patient.   Immunizations Will get tetanus shot       Leroy Sea M.D on 09/21/2012 at 3:37 PM

## 2012-09-21 NOTE — Telephone Encounter (Signed)
[facility] calling about order for pt's labwork. Please f/u with [facility] at [phone].

## 2012-09-23 ENCOUNTER — Telehealth: Payer: Self-pay

## 2012-09-23 NOTE — Telephone Encounter (Signed)
Spoke with [facility] they received the bottles for blood work Patient still has not returned for additional test

## 2012-09-23 NOTE — Telephone Encounter (Signed)
Used interpreter line to tell patient he needs to return to [facility] To have additonal blood drawn

## 2012-10-28 ENCOUNTER — Ambulatory Visit: Payer: Medicaid Other

## 2012-11-14 ENCOUNTER — Emergency Department (INDEPENDENT_AMBULATORY_CARE_PROVIDER_SITE_OTHER): Payer: Medicaid Other

## 2012-11-14 ENCOUNTER — Emergency Department (HOSPITAL_COMMUNITY)
Admission: EM | Admit: 2012-11-14 | Discharge: 2012-11-14 | Disposition: A | Discharge status: Home / Self Care | Payer: Medicaid Other | Source: Home / Self Care | Attending: Emergency Medicine | Admitting: Emergency Medicine

## 2012-11-14 ENCOUNTER — Encounter (HOSPITAL_COMMUNITY): Payer: Self-pay

## 2012-11-14 DIAGNOSIS — R0602 Shortness of breath: Secondary | ICD-10-CM

## 2012-11-14 MED ORDER — ALBUTEROL SULFATE (5 MG/ML) 0.5% IN NEBU
5.0000 mg | INHALATION_SOLUTION | Freq: Once | RESPIRATORY_TRACT | Status: AC
  Administered 2012-11-14: 5 mg via RESPIRATORY_TRACT

## 2012-11-14 MED ORDER — ALBUTEROL SULFATE (5 MG/ML) 0.5% IN NEBU
INHALATION_SOLUTION | RESPIRATORY_TRACT | Status: AC
  Filled 2012-11-14: qty 1

## 2012-11-14 MED ORDER — ALBUTEROL SULFATE HFA 108 (90 BASE) MCG/ACT IN AERS: 1.0000 | INHALATION_SPRAY | RESPIRATORY_TRACT | Status: DC | PRN

## 2012-11-14 NOTE — ED Provider Notes (Signed)
CSN: [id number]     Arrival date & time 11/14/12  1101 History   None    Chief Complaint  Patient presents with  . Cough   (Consider location/radiation/quality/duration/timing/severity/associated sxs/prior Treatment) HPI Comments: Has had similar sx in the past, thinks there must be something wrong with his heart or lungs  Patient is a 67 y.o. male presenting with cough. The history is provided by the patient. A language interpreter was used Armed forces technical officer interpreter).  Cough Cough characteristics:  Non-productive (pt feels like he needs to cough something out but can't) Severity:  Severe Onset quality:  Unable to specify Duration:  2 weeks Timing:  Intermittent Progression:  Worsening Chronicity:  New Relieved by:  Nothing Worsened by:  Lying down Ineffective treatments:  None tried Associated symptoms: shortness of breath   Associated symptoms: no chest pain, no chills, no diaphoresis, no fever, no rhinorrhea, no sinus congestion and no sore throat     Past Medical History  Diagnosis Date  . Chronic headaches   . Subdural hematoma, chronic   . Anxiety   . Depression   . Prostate cancer     receiving radiation treatment  . Adenomatous polyp of colon 08/2010    4 polyps removed at colonoscopy by Dr Marina Goodell  . Diverticulosis of colon 08/2010  . Internal hemorrhoids 08/2010  . Tuberculosis    Past Surgical History  Procedure Laterality Date  . Shrapnel removal      skull during [name] War  . Flexible sigmoidoscopy  03/29/2012    Procedure: FLEXIBLE SIGMOIDOSCOPY;  Surgeon: Louis Meckel, MD;  Location: [facility] ENDOSCOPY;  Service: Endoscopy;  Laterality: N/A;  possibly may do colonoscopy but prep is only for a flex  . Flexible sigmoidoscopy  03/31/2012    Procedure: FLEXIBLE SIGMOIDOSCOPY;  Surgeon: Louis Meckel, MD;  Location: [facility] ENDOSCOPY;  Service: Endoscopy;  Laterality: N/A;  . Hemorrhoid banding  03/31/2012    Procedure: HEMORRHOID BANDING;  Surgeon: Louis Meckel, MD;  Location: [facility] ENDOSCOPY;  Service: Endoscopy;  Laterality: N/A;   History reviewed. No pertinent family history. History  Substance Use Topics  . Smoking status: Former Games developer  . Smokeless tobacco: Never Used  . Alcohol Use: No     Comment: 1 beer daily    Review of Systems  Constitutional: Negative for fever, chills and diaphoresis.  HENT: Negative for sore throat and rhinorrhea.   Respiratory: Positive for cough and shortness of breath.   Cardiovascular: Negative for chest pain and leg swelling.    Allergies  Review of patient's allergies indicates no known allergies.  Home Medications   Current Outpatient Rx  Name  Route  Sig  Dispense  Refill  . albuterol (PROVENTIL HFA;VENTOLIN HFA) 108 (90 BASE) MCG/ACT inhaler   Inhalation   Inhale 2 puffs into the lungs every 6 (six) hours as needed for wheezing.   1 Inhaler   4   . albuterol (PROVENTIL HFA;VENTOLIN HFA) 108 (90 BASE) MCG/ACT inhaler   Inhalation   Inhale 1-2 puffs into the lungs every 4 (four) hours as needed for wheezing.   1 Inhaler   0     Please dispense with spacer and give instructions  ...   . benzocaine (ORAJEL) 10 % mucosal gel   Mouth/Throat   Use as directed in the mouth or throat as needed for pain.   5.3 g   0   . docusate sodium (COLACE) 100 MG capsule   Oral   Take 1  capsule (100 mg total) by mouth 2 (two) times daily.   20 capsule   0   . guaiFENesin-dextromethorphan (ROBITUSSIN DM) 100-10 MG/5ML syrup   Oral   Take 5 mLs by mouth 3 (three) times daily as needed for cough.   118 mL   0   . hydrocortisone (ANUSOL-HC) 25 MG suppository   Rectal   Place 1 suppository (25 mg total) rectally 2 (two) times daily.   30 suppository   1   . memantine (NAMENDA) 5 MG tablet   Oral   Take 5 mg by mouth 2 (two) times daily.          BP 99/70  Pulse 62  Temp(Src) 97.4 F (36.3 C) (Oral)  Resp 14  SpO2 96% Physical Exam  Constitutional: He appears well-developed and  well-nourished. He does not appear ill. No distress.  Cardiovascular: Normal rate and regular rhythm.   Pulmonary/Chest: Effort normal and breath sounds normal. No respiratory distress.    ED Course  Procedures (including critical care time) Labs Review Labs Reviewed - No data to display Imaging Review Dg Chest 2 View  11/14/2012   *RADIOLOGY REPORT*  Clinical Data: Shortness of breath and cough  CHEST - 2 VIEW  Comparison: 08/30/2012  Findings: The heart and pulmonary vascularity are within normal limits.  The lungs are clear bilaterally.  Old rib fractures are again seen on the right.  IMPRESSION: No acute abnormality is noted.   Original Report Authenticated By: Alcide Clever, M.D.   ekg shows sinus brady, otherwise normal.  MDM   1. Shortness of breath   pt feels significantly better after albuterol nebs.  Lungs still clear.  Peak flow was the same pre and post treatment, but given pt feels better, will refer to pulm.  Rx albuterol with spacer.     Cathlyn Parsons, NP 11/14/12 1355

## 2012-11-14 NOTE — ED Provider Notes (Signed)
Medical screening examination/treatment/procedure(s) were performed by non-physician practitioner and as supervising physician I was immediately available for consultation/collaboration.  Lidie Glade, M.D.  Shivan Hodes C Efrata Brunner, MD 11/14/12 1447 

## 2012-11-14 NOTE — ED Notes (Signed)
Reported 6 week duration of fatigue, cough, burning in chest, general body aches

## 2012-11-15 LAB — AFB CULTURE, BLOOD

## 2013-01-02 ENCOUNTER — Encounter: Payer: Self-pay | Admitting: Internal Medicine

## 2013-01-02 ENCOUNTER — Ambulatory Visit (INDEPENDENT_AMBULATORY_CARE_PROVIDER_SITE_OTHER): Payer: Medicaid Other | Admitting: Internal Medicine

## 2013-01-02 VITALS — BP 122/70 | HR 79 | Temp 99.0°F | Ht 59.5 in | Wt 125.6 lb

## 2013-01-02 DIAGNOSIS — R05 Cough: Secondary | ICD-10-CM | POA: Insufficient documentation

## 2013-01-02 MED ORDER — PANTOPRAZOLE SODIUM 40 MG PO TBEC: 40.0000 mg | DELAYED_RELEASE_TABLET | Freq: Every day | ORAL | Status: DC

## 2013-01-02 MED ORDER — FAMOTIDINE 20 MG PO TABS: ORAL_TABLET | ORAL | Status: DC

## 2013-01-02 NOTE — Patient Instructions (Signed)
Please see patient coordinator before you leave today  to schedule sinus ct   Pantoprazole (protonix) 40 mg   Take 30-60 min before first meal of the day and Pepcid 20 mg one bedtime x one month minimum   GERD (REFLUX)  is an extremely common cause of respiratory symptoms, many times with no significant heartburn at all.    It can be treated with medication, but also with lifestyle changes including avoidance of late meals, excessive alcohol, smoking cessation, and avoid fatty foods, chocolate, peppermint, colas, red wine, and acidic juices such as orange juice.  NO MINT OR MENTHOL PRODUCTS SO NO COUGH DROPS  USE SUGARLESS CANDY INSTEAD (jolley ranchers or Stover's)  NO OIL BASED VITAMINS - use powdered substitutes.  Return to your primary care doctor who can refer you to the right specialist if needed

## 2013-01-02 NOTE — Progress Notes (Addendum)
  Subjective:    Patient ID: [name], male    DOB: [birth year]    MRN: [id number]  HPI  67 yo vietamese male quit smoking in 1994 with no resp symptoms at that point but with new  onset cough since at least 2010 but worse since  2012 referred by [facility] 01/02/2013 to pulmonary clinic for eval.  01/02/2013 1st [facility] office visit/ Tawnia Schirm cc cough x 4 years, worse x 2 years with  slt green mucus prod worse as day goes on assoc with new sob, sometimes cough wakes him up, first thing in am but not the worst in ams. ? Some better p saba. Assoc with nasal congestion and sinus pressure     No obvious day to day or daytime variabilty or assoc cp or  subjective wheeze overt   hb symptoms though does have some indigestion depending on what he eats.   No unusual exp hx or h/o childhood pna/ asthma or knowledge of premature birth.  Sleeping ok without nocturnal  or early am exacerbation  of respiratory  c/o's or need for noct saba. Also denies any obvious fluctuation of symptoms with weather or environmental changes or other aggravating or alleviating factors except as outlined above   Current Medications, Allergies, Complete Past Medical History, Past Surgical History, Family History, and Social History were reviewed in [employer] record.          Review of Systems  Constitutional: Negative for fever and unexpected weight change.  HENT: Positive for congestion, postnasal drip and sinus pressure. Negative for dental problem, ear pain, nosebleeds, rhinorrhea, sneezing, sore throat and trouble swallowing.   Eyes: Negative for redness and itching.  Respiratory: Positive for cough, chest tightness and shortness of breath. Negative for wheezing.   Cardiovascular: Negative for palpitations and leg swelling.  Gastrointestinal: Negative for nausea and vomiting.  Genitourinary: Negative for dysuria.  Musculoskeletal: Negative for joint swelling.  Skin: Negative for rash.   Neurological: Negative for headaches.  Hematological: Does not bruise/bleed easily.  Psychiatric/Behavioral: Positive for dysphoric mood. The patient is nervous/anxious.        Objective:   Physical Exam  Elderly vietnamese male nad Wt Readings from Last 3 Encounters:  01/02/13 125 lb 9.6 oz (56.972 kg)  09/21/12 124 lb 9.6 oz (56.518 kg)  09/14/12 124 lb (56.246 kg)      HEENT: nl dentition, turbinates, and orophanx. Nl external ear canals without cough reflex   NECK :  without JVD/Nodes/TM/ nl carotid upstrokes bilaterally   LUNGS: no acc muscle use, clear to A and P bilaterally without cough on insp or exp maneuvers   CV:  RRR  no s3 or murmur or increase in P2, no edema   ABD:  soft and nontender with nl excursion in the supine position. No bruits or organomegaly, bowel sounds nl  MS:  warm without deformities, calf tenderness, cyanosis or clubbing  SKIN: warm and dry without lesions    NEURO:  alert, approp, no deficits      cxr 11/14/12 No acute abnormality is noted.         Assessment & Plan:

## 2013-01-03 NOTE — Assessment & Plan Note (Signed)
The most common causes of chronic cough in immunocompetent adults include the following: upper airway cough syndrome (UACS), previously referred to as postnasal drip syndrome (PNDS), which is caused by variety of rhinosinus conditions; (2) asthma; (3) GERD; (4) chronic bronchitis from cigarette smoking or other inhaled environmental irritants; (5) nonasthmatic eosinophilic bronchitis; and (6) bronchiectasis.   These conditions, singly or in combination, have accounted for up to 94% of the causes of chronic cough in prospective studies.   Other conditions have constituted no >6% of the causes in prospective studies These have included bronchogenic carcinoma, chronic interstitial pneumonia, sarcoidosis, left ventricular failure, ACEI-induced cough, and aspiration from a condition associated with pharyngeal dysfunction.    Chronic cough is often simultaneously caused by more than one condition. A single cause has been found from 38 to 82% of the time, multiple causes from 18 to 62%. Multiply caused cough has been the result of three diseases up to 42% of the time.       Given the nl exam and cxr most likely this is  Classic Upper airway cough syndrome, so named because it's frequently impossible to sort out how much is  CR/sinusitis with freq throat clearing (which can be related to primary GERD)   vs  causing  secondary (" extra esophageal")  GERD from wide swings in gastric pressure that occur with throat clearing, often  promoting self use of mint and menthol lozenges that reduce the lower esophageal sphincter tone and exacerbate the problem further in a cyclical fashion.   These are the same pts (now being labeled as having "irritable larynx syndrome" by some cough centers) who not infrequently have a history of having failed to tolerate ace inhibitors,  dry powder inhalers or biphosphonates or report having atypical reflux symptoms that don't respond to standard doses of PPI , and are easily confused  as having aecopd or asthma flares by even experienced allergists/ pulmonologists.  Needs trial of rx for gerd and rhinitis/ sinusitis while waiting for sinus ct and if abn can extend augmentin x 21 days then f/u ent if not better.  Does not appear to clinically have any sign copd/ ab at present.

## 2013-01-06 ENCOUNTER — Ambulatory Visit (INDEPENDENT_AMBULATORY_CARE_PROVIDER_SITE_OTHER)
Admission: RE | Admit: 2013-01-06 | Discharge: 2013-01-06 | Disposition: A | Discharge status: Home / Self Care | Payer: Medicaid Other | Source: Ambulatory Visit | Attending: Internal Medicine | Admitting: Internal Medicine

## 2013-01-06 DIAGNOSIS — R059 Cough, unspecified: Secondary | ICD-10-CM

## 2013-01-06 DIAGNOSIS — R05 Cough: Secondary | ICD-10-CM

## 2013-01-16 ENCOUNTER — Telehealth: Payer: Self-pay | Admitting: Internal Medicine

## 2013-01-16 NOTE — Telephone Encounter (Signed)
I spoke with pt GF. She reports pt tried to explain his CT results to her but she didn't know what he was meaning. i advised her he put his friend "tim" on the phone and spoke with him since pt gave permission. She reports she does not know who Jorja Loa is. The GF does not live in the same house as pt. I advised her if pt wants her to have the results he will need to sign a release for [name] to speak with her. Nothing further needed

## 2013-01-16 NOTE — Progress Notes (Signed)
Quick Note:  Called, spoke with pt who put his friend, Jorja Loa, on the phone to translate.  Informed Tim of results and recs per MW. Tim verbalized understanding and then informed pt of results. Per Tim, pt has no further questions or concerns at this time. ______

## 2013-07-12 ENCOUNTER — Emergency Department (HOSPITAL_COMMUNITY)
Admission: EM | Admit: 2013-07-12 | Discharge: 2013-07-12 | Disposition: A | Discharge status: Home / Self Care | Payer: Medicaid Other | Source: Home / Self Care | Attending: Family Medicine | Admitting: Family Medicine

## 2013-07-12 ENCOUNTER — Encounter (HOSPITAL_COMMUNITY): Payer: Self-pay | Admitting: Emergency Medicine

## 2013-07-12 DIAGNOSIS — M545 Low back pain, unspecified: Secondary | ICD-10-CM

## 2013-07-12 LAB — POCT URINALYSIS DIP (DEVICE)
Bilirubin Urine: NEGATIVE
GLUCOSE, UA: NEGATIVE mg/dL
HGB URINE DIPSTICK: NEGATIVE
Ketones, ur: NEGATIVE mg/dL
Leukocytes, UA: NEGATIVE
NITRITE: NEGATIVE
PH: 6.5 (ref 5.0–8.0)
PROTEIN: NEGATIVE mg/dL
SPECIFIC GRAVITY, URINE: 1.01 (ref 1.005–1.030)
UROBILINOGEN UA: 0.2 mg/dL (ref 0.0–1.0)

## 2013-07-12 MED ORDER — HYDROCODONE-ACETAMINOPHEN 5-325 MG PO TABS: 1.0000 | ORAL_TABLET | Freq: Four times a day (QID) | ORAL | Status: DC | PRN

## 2013-07-12 MED ORDER — CYCLOBENZAPRINE HCL 5 MG PO TABS: 5.0000 mg | ORAL_TABLET | Freq: Every evening | ORAL | Status: DC | PRN

## 2013-07-12 MED ORDER — PREDNISONE 20 MG PO TABS: 40.0000 mg | ORAL_TABLET | Freq: Every day | ORAL | Status: DC

## 2013-07-12 NOTE — ED Provider Notes (Addendum)
[name] is a 68 y.o. male who presents to Urgent Care today for back pain. Patient has left low back pain radiating to the left buttocks. The pain does not radiate to the legs nor is it associated with weakness or numbness. Patient denies any new difficulty urinating or bowel or bladder problems. He denies any injury. The pain started this morning. The pain is moderate to severe. The pain is worse with activity, and better with rest. Patient feels well otherwise.   Past Medical History  Diagnosis Date  . Chronic headaches   . Subdural hematoma, chronic   . Anxiety   . Depression   . Prostate cancer     receiving radiation treatment  . Adenomatous polyp of colon 08/2010    4 polyps removed at colonoscopy by Dr Henrene Pastor  . Diverticulosis of colon 08/2010  . Internal hemorrhoids 08/2010  . Tuberculosis    History  Substance Use Topics  . Smoking status: Former Smoker -- 0.25 packs/day for 5 years    Types: Cigarettes    Quit date: 03/16/1992  . Smokeless tobacco: Never Used  . Alcohol Use: 0.6 oz/week    1 Cans of beer per week     Comment: 1 beer daily   ROS as above Medications: No current facility-administered medications for this encounter.   Current Outpatient Prescriptions  Medication Sig Dispense Refill  . albuterol (PROVENTIL HFA;VENTOLIN HFA) 108 (90 BASE) MCG/ACT inhaler Inhale 2 puffs into the lungs every 6 (six) hours as needed for wheezing.  1 Inhaler  4  . benzocaine (ORAJEL) 10 % mucosal gel Use as directed in the mouth or throat as needed for pain.  5.3 g  0  . cyclobenzaprine (FLEXERIL) 5 MG tablet Take 1 tablet (5 mg total) by mouth at bedtime as needed for muscle spasms. [place] instructions if possible.  20 tablet  0  . docusate sodium (COLACE) 100 MG capsule Take 1 capsule (100 mg total) by mouth 2 (two) times daily.  20 capsule  0  . famotidine (PEPCID) 20 MG tablet One at bedtime  30 tablet  11  . HYDROcodone-acetaminophen (NORCO/VICODIN) 5-325 MG per tablet  Take 1 tablet by mouth every 6 (six) hours as needed. [place] instructions if possible.  15 tablet  0  . hydrocortisone (ANUSOL-HC) 25 MG suppository Place 1 suppository (25 mg total) rectally 2 (two) times daily.  30 suppository  1  . memantine (NAMENDA) 5 MG tablet Take 5 mg by mouth 2 (two) times daily.      . pantoprazole (PROTONIX) 40 MG tablet Take 1 tablet (40 mg total) by mouth daily. Take 30-60 min before first meal of the day  30 tablet  2  . predniSONE (DELTASONE) 20 MG tablet Take 2 tablets (40 mg total) by mouth daily with breakfast. [place] instructions if possible.  14 tablet  0    Exam:  BP 103/76  Pulse 80  Temp(Src) 97.6 F (36.4 C) (Oral)  Resp 18  SpO2 100% Gen: Well NAD HEENT: EOMI,  MMM Lungs: Normal work of breathing. CTABL Heart: RRR no MRG Abd: NABS, Soft. NT, ND Exts: Brisk capillary refill, warm and well perfused.  Back: Nontender to spinal midline. Tender to palpation left low back. Hip range of motion is intact bilaterally Reflexes are normal and equal bilateral knees and ankles Strength is intact throughout Range of motion normal to flexion but limited to extension by pain  Results for orders placed during the hospital encounter of 07/12/13 (from  the past 24 hour(s))  POCT URINALYSIS DIP (DEVICE)     Status: None   Collection Time    07/12/13  1:10 PM      Result Value Ref Range   Glucose, UA NEGATIVE  NEGATIVE mg/dL   Bilirubin Urine NEGATIVE  NEGATIVE   Ketones, ur NEGATIVE  NEGATIVE mg/dL   Specific Gravity, Urine 1.010  1.005 - 1.030   Hgb urine dipstick NEGATIVE  NEGATIVE   pH 6.5  5.0 - 8.0   Protein, ur NEGATIVE  NEGATIVE mg/dL   Urobilinogen, UA 0.2  0.0 - 1.0 mg/dL   Nitrite NEGATIVE  NEGATIVE   Leukocytes, UA NEGATIVE  NEGATIVE   No results found.  Assessment and Plan: 68 y.o. male with lumbago due to myofascial disruption. Plan to treat with prednisone Flexeril and Norco.   Discussed warning signs or symptoms. Please see  discharge instructions. Patient expresses understanding.    Gregor Hams, MD 07/12/13 1507  [place] interpreter using phone service was used  Gregor Hams, MD 07/12/13 [phone]

## 2013-07-12 NOTE — Discharge Instructions (Signed)
Thank you for coming in today.  C?ng th?t l?ng cng ( Lumbosacral Strain) C?ng vng th?t l?ng cng l tnh tr?ng c?ng b?t k? b? ph?n no t?o nn nh?ng ??t s?ng th?t l?ng cng c?a qu v?. Cc ??t s?ng th?t l?ng cng c?a qu v? l nh?ng ph?n x??ng t?o nn m?t ph?n ba pha d??i x??ng s?ng. Cc ??t s?ng th?t l?ng cng c?a qu v? ???c g?n v?i nhau b?ng cc c? v m x? ch?c ch?n (dy ch?ng).  NGUYN NHN.  M?t c ?nh b?t ng? vo l?ng c th? gy c?ng th?t l?ng cng. Ngoi ra, b?t k? ?i?u g lm c?ng cc c? ? th?t l?ng qu m?c c?ng c th? gy ra ch?ng c?ng th?t l?ng cng ny. Tnh tr?ng ny th??ng th?y ? nh?ng ng??i g?ng s?c qu m?c, ng, nng v?t n?ng, g?p ng??i, ho?c ci xu?ng l?p ?i l?p l?i nhi?u l?n. CC Y?U T? NGUY C?  Cng vi?c ?i h?i s?c l?c.  Tham gia vo cc mn th? thao ??y ho?c ko ?i h?i ph?i v?n l?ng ??t ng?t (qu?n v?t, ?nh gn, bng chy).  Nng t?.  U?n cong qu m?c ph?n th?t l?ng.  Khung x??ng ch?u nghing v? pha tr??c.  Y?u c? l?ng ho?c y?u c? b?ng ho?c c? hai.  Gn kheo c?ng. D?U HI?U V TRI?U CH?NG  C?ng th?t l?ng cng c th? gy ?au ? vng b? ch?n th??ng ho?c c?n ?au di chuy?n (lan t?a) xu?ng chn qu v?.  CH?N ?ON Chuyn gia ch?m [place] s?c kh?e th??ng c th? ch?n ?on c?ng th?t l?ng cng b?ng cch khm th?c th?[place] m?t s? tr??ng h?p qu v? c th? c?n cc ki?m tra nh? ch?p X quang.  ?I?U TR?  Vi?c ?i?u tr? ch?n th??ng vng th?t l?ng ty thu?c vo nhi?u y?u t? m bc s? lm sng s? ph?i ?nh gi. Tuy nhin, h?u h?t vi?c ?i?u tr? s? bao g?m vi?c s? d?ng thu?c ch?ng vim. H??NG D?N CH?M [place] T?I NH   Trnh nh?ng ho?t ??ng th? ch?t n?ng (qu?n v?t, racquetball, l??t vn n??c) n?u qu v? khng c ?? tnh tr?ng th? l?c ?? th?c hi?n cc ho?t ??ng ?. ?i?u ny c th? lm tr?m tr?ng thm ho?c gy ra v?n ??.  N?u qu v? c m?t v?n ?? ? l?ng, hy trnh nh?ng mn th? thao ?i h?i ph?i c? ??ng c? th? ??t ng?t. B?i v ?i b? th??ng l nh?ng ho?t ??ng an ton h?n.  Duy tr t? th?  thch h?p.  Duy tr cn n?ng c l?i cho s?c kh?e.  ??i v?i nh?ng tnh tr?ng c?p tnh, qu v? c th? ch??m ? l?nh ln vng b? th??ng.  Cho ? l?nh vo ti nh?a.  ?? kh?n t?m vo gi?a da v ti.  Ch??m ? l?nh ln vng b? th??ng trong kho?ng 20 pht, 2 - 3 l?n m?i ngy.  Khi vng th?t l?ng b?t ??u lnh, c th? t?p cc bi t?p ko c?ng ho?c t?ng c??ng s?c kh?e. ?I KHM N?U:  ?au l?ng tr? nn t? h?n.  Qu v? b? ?au l?ng n?ng v khng ?? khi dng thu?c. NGAY L?P T?C ?I KHM N?U:   Qu v? b? t, ?au bu?t, y?u, ho?c cc v?n ?? khi c? ??ng tay ho?c chn.  C s? thay ??i trong vi?c ki?m sot ??i ti?n ho?c ti?u ti?n.  Qu v? c c?n ?au t?ng ln ? b?t k? vng no c?a c? th?, k? c? ? vng  b?ng (b?ng).  Qu v? th?y kh th?, chng m?t, ho?c c?m th?y mu?n ng?t.  Qu v? c?m th?y kh ch?u ? d? dy (bu?n nn), nn (nn m?a), ho?c ?? m? hi.  Qu v? th?y ??i m?u ngn chn ho?c chn, ho?c bn chn qu v? r?t l?nh. ??M B?O QU V?:   Hi?u r cc h??ng d?n ny.  S? theo di tnh tr?ng c?a mnh.  S? yu c?u tr? gip ngay l?p t?c n?u qu v? c?m th?y khng kh?e ho?c th?y tr?m tr?ng h?n. Document Released: 12/10/2004 Document Revised: 12/21/2012 [facility] Patient Information 2014 [place], [place].

## 2013-07-12 NOTE — ED Notes (Signed)
Via [place] language line interpreter Pt c/o lower back/left hip pain onset this am States he woke up w/the pain this am; yest he was fine Denies inj/trauma, urinary sx; having normal BM Pain increases w/activity and when bearing wt Pain eases when sitting  Brought back in wheelchair Alert w/no signs of acute distress

## 2013-08-02 ENCOUNTER — Encounter (HOSPITAL_COMMUNITY)
Admission: RE | Admit: 2013-08-02 | Discharge: 2013-08-02 | Disposition: A | Discharge status: Home / Self Care | Payer: Medicaid Other | Source: Ambulatory Visit | Attending: Oral Surgery | Admitting: Oral Surgery

## 2013-08-02 ENCOUNTER — Encounter (HOSPITAL_COMMUNITY): Payer: Self-pay

## 2013-08-02 ENCOUNTER — Encounter (HOSPITAL_COMMUNITY): Payer: Self-pay | Admitting: Pharmacy Technician

## 2013-08-02 DIAGNOSIS — Z01812 Encounter for preprocedural laboratory examination: Secondary | ICD-10-CM | POA: Insufficient documentation

## 2013-08-02 LAB — BASIC METABOLIC PANEL
BUN: 22 mg/dL (ref 6–23)
CALCIUM: 9 mg/dL (ref 8.4–10.5)
CO2: 25 meq/L (ref 19–32)
CREATININE: 1.2 mg/dL (ref 0.50–1.35)
Chloride: 106 mEq/L (ref 96–112)
GFR calc Af Amer: 71 mL/min — ABNORMAL LOW (ref >90)
GFR, EST NON AFRICAN AMERICAN: 61 mL/min — AB (ref >90)
Glucose, Bld: 99 mg/dL (ref 70–99)
Potassium: 4.3 mEq/L (ref 3.7–5.3)
SODIUM: 141 meq/L (ref 137–147)

## 2013-08-02 LAB — CBC
HCT: 35.3 % — ABNORMAL LOW (ref 39.0–52.0)
Hemoglobin: 12 g/dL — ABNORMAL LOW (ref 13.0–17.0)
MCH: 33.3 pg (ref 26.0–34.0)
MCHC: 34 g/dL (ref 30.0–36.0)
MCV: 98.1 fL (ref 78.0–100.0)
PLATELETS: 216 10*3/uL (ref 150–400)
RBC: 3.6 MIL/uL — AB (ref 4.22–5.81)
RDW: 12.1 % (ref 11.5–15.5)
WBC: 5.5 10*3/uL (ref 4.0–10.5)

## 2013-08-02 NOTE — H&P (Signed)
HISTORY AND PHYSICAL  [name] is a 68 y.o. male patient with CC: Referred by general dentist for multiple extractions.  No diagnosis found.  Past Medical History  Diagnosis Date  . Chronic headaches   . Subdural hematoma, chronic   . Anxiety   . Depression   . Prostate cancer     receiving radiation treatment  . Adenomatous polyp of colon 08/2010    4 polyps removed at colonoscopy by Dr Henrene Pastor  . Diverticulosis of colon 08/2010  . Internal hemorrhoids 08/2010  . Tuberculosis     No current facility-administered medications for this encounter.   Current Outpatient Prescriptions  Medication Sig Dispense Refill  . albuterol (PROVENTIL HFA;VENTOLIN HFA) 108 (90 BASE) MCG/ACT inhaler Inhale 2 puffs into the lungs every 6 (six) hours as needed for wheezing.  1 Inhaler  4  . benzocaine (ORAJEL) 10 % mucosal gel Use as directed in the mouth or throat as needed for pain.  5.3 g  0  . cyclobenzaprine (FLEXERIL) 5 MG tablet Take 1 tablet (5 mg total) by mouth at bedtime as needed for muscle spasms. [place] instructions if possible.  20 tablet  0  . docusate sodium (COLACE) 100 MG capsule Take 1 capsule (100 mg total) by mouth 2 (two) times daily.  20 capsule  0  . famotidine (PEPCID) 20 MG tablet One at bedtime  30 tablet  11  . HYDROcodone-acetaminophen (NORCO/VICODIN) 5-325 MG per tablet Take 1 tablet by mouth every 6 (six) hours as needed. [place] instructions if possible.  15 tablet  0  . hydrocortisone (ANUSOL-HC) 25 MG suppository Place 1 suppository (25 mg total) rectally 2 (two) times daily.  30 suppository  1  . memantine (NAMENDA) 5 MG tablet Take 5 mg by mouth 2 (two) times daily.      . pantoprazole (PROTONIX) 40 MG tablet Take 1 tablet (40 mg total) by mouth daily. Take 30-60 min before first meal of the day  30 tablet  2  . predniSONE (DELTASONE) 20 MG tablet Take 2 tablets (40 mg total) by mouth daily with breakfast. [place] instructions if possible.  14 tablet  0   No  Known Allergies Active Problems:   * No active hospital problems. *  Vitals: There were no vitals taken for this visit. Lab results:No results found for this or any previous visit (from the past 57 hour(s)). Radiology Results: No results found. General appearance: alert, cooperative and no distress Head: Normocephalic, without obvious abnormality, atraumatic Eyes: negative Nose: Nares normal. Septum midline. Mucosa normal. No drainage or sinus tenderness. Throat: Dental caries and periodontitis teeth #'s 1, 2, 3, 6, 7, 9, 11, 15, 16, 17, 20, 21, 22, 23, 24, 26, 29, 30, 32, Buccal exostosis left maxilla Neck: no adenopathy, supple, symmetrical, trachea midline and thyroid not enlarged, symmetric, no tenderness/mass/nodules Resp: clear to auscultation bilaterally Cardio: regular rate and rhythm, S1, S2 normal, no murmur, click, rub or gallop  Assessment: Non-restorable teeth #'s 1, 2, 3, 6, 7, 9, 11, 15, 16, 17, 20, 21, 22, 23, 24, 26, 29, 30, 32, Buccal exostosis left maxilla  Plan:Extract teeth #'s 1, 2, 3, 6, 7, 9, 11, 15, 16, 17, 20, 21, 22, 23, 24, 26, 29, 30, 32, Alveoloplasty, removal  Buccal exostosis left maxilla. General anesthesia. Day surgery.   Donato Studley Parthenia Ames 08/02/2013

## 2013-08-02 NOTE — Pre-Procedure Instructions (Signed)
[name]  08/02/2013   Your procedure is scheduled on:  08/08/13  Report to [facility] Admitting at 530 AM.  Call this number if you have problems the morning of surgery: [phone]   Remember:   Do not eat food or drink liquids after midnight.   Take these medicines the morning of surgery with A SIP OF WATER: all inhalers,hydrocodone,protonix   Do not wear jewelry, make-up or nail polish.  Do not wear lotions, powders, or perfumes. You may wear deodorant.  Do not shave 48 hours prior to surgery. Men may shave face and neck.  Do not bring valuables to the hospital.  [facility] is not responsible                  for any belongings or valuables.               Contacts, dentures or bridgework may not be worn into surgery.  Leave suitcase in the car. After surgery it may be brought to your room.  For patients admitted to the hospital, discharge time is determined by your                treatment team.               Patients discharged the day of surgery will not be allowed to drive  home.  Name and phone number of your driver: family  Special Instructions: Shower using CHG 2 nights before surgery and the night before surgery.  If you shower the day of surgery use CHG.  Use special wash - you have one bottle of CHG for all showers.  You should use approximately 1/3 of the bottle for each shower.   Please read over the following fact sheets that you were given: Pain Booklet, Coughing and Deep Breathing and Surgical Site Infection Prevention

## 2013-08-07 MED ORDER — CEFAZOLIN SODIUM-DEXTROSE 2-3 GM-% IV SOLR
2.0000 g | INTRAVENOUS | Status: AC
  Administered 2013-08-08: 2 g via INTRAVENOUS
  Filled 2013-08-07: qty 50

## 2013-08-08 ENCOUNTER — Ambulatory Visit (HOSPITAL_COMMUNITY): Payer: Medicaid Other | Admitting: Anesthesiology

## 2013-08-08 ENCOUNTER — Ambulatory Visit (HOSPITAL_COMMUNITY)
Admission: RE | Admit: 2013-08-08 | Discharge: 2013-08-08 | Disposition: A | Discharge status: Home / Self Care | Payer: Medicaid Other | Source: Ambulatory Visit | Attending: Oral Surgery | Admitting: Oral Surgery

## 2013-08-08 ENCOUNTER — Encounter (HOSPITAL_COMMUNITY): Payer: Self-pay | Admitting: *Deleted

## 2013-08-08 ENCOUNTER — Encounter (HOSPITAL_COMMUNITY): Payer: Medicaid Other | Admitting: Anesthesiology

## 2013-08-08 ENCOUNTER — Encounter (HOSPITAL_COMMUNITY)
Admission: RE | Disposition: A | Discharge status: Home / Self Care | Payer: Self-pay | Source: Ambulatory Visit | Attending: Oral Surgery

## 2013-08-08 DIAGNOSIS — Z79899 Other long term (current) drug therapy: Secondary | ICD-10-CM | POA: Insufficient documentation

## 2013-08-08 DIAGNOSIS — N138 Other obstructive and reflux uropathy: Secondary | ICD-10-CM

## 2013-08-08 DIAGNOSIS — R799 Abnormal finding of blood chemistry, unspecified: Secondary | ICD-10-CM

## 2013-08-08 DIAGNOSIS — M898X9 Other specified disorders of bone, unspecified site: Secondary | ICD-10-CM | POA: Insufficient documentation

## 2013-08-08 DIAGNOSIS — K0530 Chronic periodontitis, unspecified: Secondary | ICD-10-CM

## 2013-08-08 DIAGNOSIS — N401 Enlarged prostate with lower urinary tract symptoms: Secondary | ICD-10-CM

## 2013-08-08 DIAGNOSIS — F411 Generalized anxiety disorder: Secondary | ICD-10-CM | POA: Insufficient documentation

## 2013-08-08 DIAGNOSIS — C61 Malignant neoplasm of prostate: Secondary | ICD-10-CM | POA: Insufficient documentation

## 2013-08-08 DIAGNOSIS — R059 Cough, unspecified: Secondary | ICD-10-CM

## 2013-08-08 DIAGNOSIS — N529 Male erectile dysfunction, unspecified: Secondary | ICD-10-CM

## 2013-08-08 DIAGNOSIS — K648 Other hemorrhoids: Secondary | ICD-10-CM

## 2013-08-08 DIAGNOSIS — R05 Cough: Secondary | ICD-10-CM

## 2013-08-08 DIAGNOSIS — K625 Hemorrhage of anus and rectum: Secondary | ICD-10-CM

## 2013-08-08 DIAGNOSIS — K5900 Constipation, unspecified: Secondary | ICD-10-CM

## 2013-08-08 DIAGNOSIS — R55 Syncope and collapse: Secondary | ICD-10-CM

## 2013-08-08 DIAGNOSIS — K029 Dental caries, unspecified: Secondary | ICD-10-CM

## 2013-08-08 DIAGNOSIS — R972 Elevated prostate specific antigen [PSA]: Secondary | ICD-10-CM

## 2013-08-08 DIAGNOSIS — F329 Major depressive disorder, single episode, unspecified: Secondary | ICD-10-CM | POA: Insufficient documentation

## 2013-08-08 DIAGNOSIS — E7800 Pure hypercholesterolemia, unspecified: Secondary | ICD-10-CM

## 2013-08-08 DIAGNOSIS — R82998 Other abnormal findings in urine: Secondary | ICD-10-CM

## 2013-08-08 DIAGNOSIS — I6200 Nontraumatic subdural hemorrhage, unspecified: Secondary | ICD-10-CM

## 2013-08-08 DIAGNOSIS — F3289 Other specified depressive episodes: Secondary | ICD-10-CM | POA: Insufficient documentation

## 2013-08-08 DIAGNOSIS — R51 Headache: Secondary | ICD-10-CM

## 2013-08-08 HISTORY — PX: MULTIPLE EXTRACTIONS WITH ALVEOLOPLASTY: SHX5342

## 2013-08-08 SURGERY — MULTIPLE EXTRACTION WITH ALVEOLOPLASTY
Anesthesia: General | Site: Mouth

## 2013-08-08 MED ORDER — NEOSTIGMINE METHYLSULFATE 10 MG/10ML IV SOLN
INTRAVENOUS | Status: AC
Start: 2013-08-08 — End: 2013-08-08
  Filled 2013-08-08: qty 1

## 2013-08-08 MED ORDER — PROPOFOL 10 MG/ML IV BOLUS
INTRAVENOUS | Status: DC | PRN
  Administered 2013-08-08: 70 mg via INTRAVENOUS

## 2013-08-08 MED ORDER — SUCCINYLCHOLINE CHLORIDE 20 MG/ML IJ SOLN
INTRAMUSCULAR | Status: DC | PRN
  Administered 2013-08-08: 100 mg via INTRAVENOUS

## 2013-08-08 MED ORDER — PROPOFOL 10 MG/ML IV BOLUS
INTRAVENOUS | Status: AC
  Filled 2013-08-08: qty 20

## 2013-08-08 MED ORDER — SUCCINYLCHOLINE CHLORIDE 20 MG/ML IJ SOLN
INTRAMUSCULAR | Status: AC
  Filled 2013-08-08: qty 1

## 2013-08-08 MED ORDER — OXYCODONE-ACETAMINOPHEN 5-325 MG PO TABS: 1.0000 | ORAL_TABLET | ORAL | Status: DC | PRN

## 2013-08-08 MED ORDER — ONDANSETRON HCL 4 MG/2ML IJ SOLN: 4.0000 mg | Freq: Once | INTRAMUSCULAR | Status: DC | PRN

## 2013-08-08 MED ORDER — NEOSTIGMINE METHYLSULFATE 10 MG/10ML IV SOLN
INTRAVENOUS | Status: DC | PRN
  Administered 2013-08-08: 3 mg via INTRAVENOUS

## 2013-08-08 MED ORDER — EPHEDRINE SULFATE 50 MG/ML IJ SOLN
INTRAMUSCULAR | Status: AC
  Filled 2013-08-08: qty 1

## 2013-08-08 MED ORDER — ROCURONIUM BROMIDE 100 MG/10ML IV SOLN
INTRAVENOUS | Status: DC | PRN
  Administered 2013-08-08: 10 mg via INTRAVENOUS

## 2013-08-08 MED ORDER — LIDOCAINE-EPINEPHRINE 2 %-1:100000 IJ SOLN
INTRAMUSCULAR | Status: AC
  Filled 2013-08-08: qty 1

## 2013-08-08 MED ORDER — FENTANYL CITRATE 0.05 MG/ML IJ SOLN
INTRAMUSCULAR | Status: DC | PRN
  Administered 2013-08-08 (×2): 50 ug via INTRAVENOUS

## 2013-08-08 MED ORDER — HYDROMORPHONE HCL PF 1 MG/ML IJ SOLN: 0.2500 mg | INTRAMUSCULAR | Status: DC | PRN

## 2013-08-08 MED ORDER — ONDANSETRON HCL 4 MG/2ML IJ SOLN
INTRAMUSCULAR | Status: AC
  Filled 2013-08-08: qty 2

## 2013-08-08 MED ORDER — LIDOCAINE-EPINEPHRINE 2 %-1:100000 IJ SOLN
INTRAMUSCULAR | Status: DC | PRN
  Administered 2013-08-08: 19 mL

## 2013-08-08 MED ORDER — LIDOCAINE HCL (CARDIAC) 20 MG/ML IV SOLN
INTRAVENOUS | Status: DC | PRN
  Administered 2013-08-08: 100 mg via INTRAVENOUS

## 2013-08-08 MED ORDER — GLYCOPYRROLATE 0.2 MG/ML IJ SOLN
INTRAMUSCULAR | Status: DC | PRN
  Administered 2013-08-08: 0.4 mg via INTRAVENOUS

## 2013-08-08 MED ORDER — DEXAMETHASONE SODIUM PHOSPHATE 4 MG/ML IJ SOLN
INTRAMUSCULAR | Status: AC
  Filled 2013-08-08: qty 2

## 2013-08-08 MED ORDER — GLYCOPYRROLATE 0.2 MG/ML IJ SOLN
INTRAMUSCULAR | Status: AC
  Filled 2013-08-08: qty 2

## 2013-08-08 MED ORDER — DEXAMETHASONE SODIUM PHOSPHATE 4 MG/ML IJ SOLN
INTRAMUSCULAR | Status: DC | PRN
  Administered 2013-08-08: 8 mg via INTRAVENOUS

## 2013-08-08 MED ORDER — OXYMETAZOLINE HCL 0.05 % NA SOLN
NASAL | Status: DC | PRN
  Administered 2013-08-08: 1 via NASAL

## 2013-08-08 MED ORDER — LIDOCAINE HCL (CARDIAC) 20 MG/ML IV SOLN
INTRAVENOUS | Status: AC
  Filled 2013-08-08: qty 5

## 2013-08-08 MED ORDER — FENTANYL CITRATE 0.05 MG/ML IJ SOLN
INTRAMUSCULAR | Status: AC
  Filled 2013-08-08: qty 5

## 2013-08-08 MED ORDER — LACTATED RINGERS IV SOLN
INTRAVENOUS | Status: DC | PRN
  Administered 2013-08-08 (×2): via INTRAVENOUS

## 2013-08-08 MED ORDER — STERILE WATER FOR INJECTION IJ SOLN
INTRAMUSCULAR | Status: AC
  Filled 2013-08-08: qty 10

## 2013-08-08 MED ORDER — SODIUM CHLORIDE 0.9 % IR SOLN
Status: DC | PRN
  Administered 2013-08-08: 1000 mL

## 2013-08-08 MED ORDER — 0.9 % SODIUM CHLORIDE (POUR BTL) OPTIME
TOPICAL | Status: DC | PRN
  Administered 2013-08-08: 1000 mL

## 2013-08-08 MED ORDER — EPHEDRINE SULFATE 50 MG/ML IJ SOLN
INTRAMUSCULAR | Status: DC | PRN
  Administered 2013-08-08 (×2): 5 mg via INTRAVENOUS

## 2013-08-08 MED ORDER — ONDANSETRON HCL 4 MG/2ML IJ SOLN
INTRAMUSCULAR | Status: DC | PRN
  Administered 2013-08-08: 4 mg via INTRAVENOUS

## 2013-08-08 MED ORDER — MIDAZOLAM HCL 2 MG/2ML IJ SOLN
INTRAMUSCULAR | Status: AC
  Filled 2013-08-08: qty 2

## 2013-08-08 MED ORDER — ROCURONIUM BROMIDE 50 MG/5ML IV SOLN
INTRAVENOUS | Status: AC
  Filled 2013-08-08: qty 1

## 2013-08-08 SURGICAL SUPPLY — 29 items
BUR CROSS CUT FISSURE 1.6 (BURR) ×2 IMPLANT
BUR CROSS CUT FISSURE 1.6MM (BURR) ×1
BUR EGG ELITE 4.0 (BURR) ×2 IMPLANT
BUR EGG ELITE 4.0MM (BURR) ×1
CANISTER SUCTION 2500CC (MISCELLANEOUS) ×3 IMPLANT
COVER SURGICAL LIGHT HANDLE (MISCELLANEOUS) ×3 IMPLANT
CRADLE DONUT ADULT HEAD (MISCELLANEOUS) ×3 IMPLANT
GAUZE PACKING FOLDED 2  STR (GAUZE/BANDAGES/DRESSINGS) ×2
GAUZE PACKING FOLDED 2 STR (GAUZE/BANDAGES/DRESSINGS) ×1 IMPLANT
GLOVE BIO SURGEON STRL SZ 6.5 (GLOVE) ×2 IMPLANT
GLOVE BIO SURGEON STRL SZ7.5 (GLOVE) ×3 IMPLANT
GLOVE BIO SURGEONS STRL SZ 6.5 (GLOVE) ×1
GLOVE BIOGEL PI IND STRL 7.0 (GLOVE) ×1 IMPLANT
GLOVE BIOGEL PI INDICATOR 7.0 (GLOVE) ×2
GOWN STRL REUS W/ TWL LRG LVL3 (GOWN DISPOSABLE) ×1 IMPLANT
GOWN STRL REUS W/ TWL XL LVL3 (GOWN DISPOSABLE) ×1 IMPLANT
GOWN STRL REUS W/TWL LRG LVL3 (GOWN DISPOSABLE) ×2
GOWN STRL REUS W/TWL XL LVL3 (GOWN DISPOSABLE) ×2
KIT BASIN OR (CUSTOM PROCEDURE TRAY) ×3 IMPLANT
KIT ROOM TURNOVER OR (KITS) ×3 IMPLANT
NEEDLE 22X1 1/2 (OR ONLY) (NEEDLE) ×3 IMPLANT
NS IRRIG 1000ML POUR BTL (IV SOLUTION) ×3 IMPLANT
PAD ARMBOARD 7.5X6 YLW CONV (MISCELLANEOUS) ×6 IMPLANT
SUT CHROMIC 3 0 PS 2 (SUTURE) ×6 IMPLANT
SYR CONTROL 10ML LL (SYRINGE) ×3 IMPLANT
TOWEL OR 17X26 10 PK STRL BLUE (TOWEL DISPOSABLE) ×3 IMPLANT
TRAY ENT MC OR (CUSTOM PROCEDURE TRAY) ×3 IMPLANT
TUBING IRRIGATION (MISCELLANEOUS) ×3 IMPLANT
YANKAUER SUCT BULB TIP NO VENT (SUCTIONS) ×3 IMPLANT

## 2013-08-08 NOTE — Op Note (Signed)
NAMEJAHMIL, MACLEOD                   ACCOUNT NO.:  [id number]  MEDICAL RECORD NO.:  [id number]  LOCATION:  [facility]                         FACILITY:  [facility]  PHYSICIAN:  Gae Bon, M.D.  DATE OF BIRTH:  [birth year]  DATE OF PROCEDURE:  08/08/2013 DATE OF DISCHARGE:                              OPERATIVE REPORT   PREOPERATIVE DIAGNOSES:  Nonrestorable teeth #1, 2, 3, 6, 7, 9, 11, 15, 16, 17, 20, 21, 22, 23, 24, 26, 29, 30, 32., exostosis left maxilla.  POSTOPERATIVE DIAGNOSES:  Nonrestorable teeth #1, 2, 3, 6, 7, 9, 11, 15, 16, 17, 20, 21, 22, 23, 24, 26, 29, 30, 32., exostosis left maxilla.  PROCEDURE:  Extraction of teeth #1, 2, 3, 6, 7, 9, 11, 15, 16, 17, 20, 21, 22, 23, 24, 26, 29, 30, 32.  Alveoplasty right and left maxilla and mandible.  Removal of exostosis left maxilla.  SURGEON:  Gae Bon, MD  ANESTHESIA:  General nasal intubation.  PROCEDURE:  The patient was taken to the operating room, placed on the table in supine position.  General anesthesia was administered and a nasal endotracheal tube was placed and secured.  The eyes were protected.  The patient was draped for the procedure.  Time-out was performed.  The posterior pharynx was suctioned and a throat pack was placed.  A 2% lidocaine with 1:100,000 epinephrine was infiltrated in inferior alveolar block on the right and left side and a buccal and palatal infiltration in the maxilla and a buccal infiltration in the anterior mandible, total of 19 mL was utilized.  A bite block was placed in the right side of the mouth, and a sweetheart retractor was used to retract the tongue, then the left side was operated first.  A #15 blade was used to make an incision around teeth #17, 20, 21, 22, 23, 24, and 26 in the mandible, and then the 15 blade was used to make an incision beginning at tooth #16 in the maxilla and carrying anteriorly to tooth #15 across the alveolar ridge to tooth #11, 9 and 7.  The periosteum  was reflected and these areas of the teeth were elevated with the 301 elevator.  The lower teeth were removed from the mouth with the dental forceps specifically #23 forceps in the posterior tooth and then the Asch forceps in the anterior tooth.  In the maxilla the #150 forceps was used to remove the teeth.  Portions of the root of tooth #16 and 17 fractured during removal and additional bone was then removed around the root tip using the Stryker handpiece under irrigation.  The root tips were removed with a root tip pick.  Then the periosteum was further reflected in the maxilla and mandible, and alveoplasty was performed using the egg-shaped bur and bone file.  The buccal exostosis in the left maxilla was smoothed with a bone file and the egg-shaped bur. Then, the incisions were closed with 3-0 chromic.  The bite block and sweetheart retractor were repositioned to the other side of the mouth and attention was turned to the right.  The 15 blade was used to make an incision beginning at tooth #  32 and carrying anteriorly to tooth #28 distal aspect and then in the maxilla.  The incision was made beginning at tooth #1 and carrying forward to tooth #6.  The periosteum was reflected and the teeth were elevated with a 301 elevator.  The upper teeth were removed with the #150 forceps.  The lower teeth were removed with the Asch forceps, and the Kelman forceps.  The portions of teeth #1 and 2 fracture and additional bone was removed, and root tip picks were used to remove the distal root of these 2 teeth, then the alveoplasty was performed using the egg-shaped bur and bone file, and then the area was irrigated and closed with 3-0 chromic.  The oral cavity was inspected and found to be of good contour, hemostasis, and closure.  The oral cavity was suctioned.  The throat pack was removed.  The patient was awakened and taken to the recovery room breathing spontaneously in good  condition.  ESTIMATED BLOOD LOSS:  Minimal.  COMPLICATIONS:  None.  SPECIMENS:  None.     Gae Bon, M.D.     SMJ/MEDQ  D:  08/08/2013  T:  08/08/2013  Job:  [id number]

## 2013-08-08 NOTE — Transfer of Care (Signed)
Immediate Anesthesia Transfer of Care Note  Patient: [name]  Procedure(s) Performed: Procedure(s): MULTIPLE EXTRACTION OF TEETH #1, 2, 3, 6, 7, 9, 11, 15, 16, 17, 20, 21, 22, 23, 24, 26, 29, 30, 32 WITH ALVEOLOPLASTY AND REMOVAL BUCCAL EXOSTOSIS LEFT MAXILLA (N/A)  Patient Location: PACU  Anesthesia Type:General  Level of Consciousness: patient cooperative and responds to stimulation  Airway & Oxygen Therapy: Patient Spontanous Breathing and Patient connected to nasal cannula oxygen  Post-op Assessment: Report given to PACU RN and Post -op Vital signs reviewed and stable  Post vital signs: Reviewed and stable  Complications: No apparent anesthesia complications

## 2013-08-08 NOTE — Anesthesia Preprocedure Evaluation (Signed)
Anesthesia Evaluation  Patient identified by MRN, date of birth, ID band Patient awake    Reviewed: Allergy & Precautions, H&P , NPO status , Patient's Chart, lab work & pertinent test results  Airway       Dental   Pulmonary former smoker,          Cardiovascular     Neuro/Psych  Headaches,    GI/Hepatic   Endo/Other    Renal/GU      Musculoskeletal   Abdominal   Peds  Hematology   Anesthesia Other Findings Hx of Tuberculosis Hx Subdural Hematoma  Reproductive/Obstetrics                           Anesthesia Physical Anesthesia Plan  ASA: II  Anesthesia Plan: General   Post-op Pain Management:    Induction: Intravenous  Airway Management Planned: Nasal ETT  Additional Equipment:   Intra-op Plan:   Post-operative Plan: Extubation in OR  Informed Consent: I have reviewed the patients History and Physical, chart, labs and discussed the procedure including the risks, benefits and alternatives for the proposed anesthesia with the patient or authorized representative who has indicated his/her understanding and acceptance.     Plan Discussed with:   Anesthesia Plan Comments:         Anesthesia Quick Evaluation

## 2013-08-08 NOTE — Anesthesia Postprocedure Evaluation (Signed)
  Anesthesia Post-op Note  Patient: [name]  Procedure(s) Performed: Procedure(s): MULTIPLE EXTRACTION OF TEETH #1, 2, 3, 6, 7, 9, 11, 15, 16, 17, 20, 21, 22, 23, 24, 26, 29, 30, 32 WITH ALVEOLOPLASTY AND REMOVAL BUCCAL EXOSTOSIS LEFT MAXILLA (N/A)  Patient Location: PACU  Anesthesia Type:General  Level of Consciousness: awake, oriented, sedated and patient cooperative  Airway and Oxygen Therapy: Patient Spontanous Breathing  Post-op Pain: none  Post-op Assessment: Post-op Vital signs reviewed, Patient's Cardiovascular Status Stable, Respiratory Function Stable, Patent Airway, No signs of Nausea or vomiting and Pain level controlled  Post-op Vital Signs: stable  Last Vitals:  Filed Vitals:   08/08/13 0845  BP: 111/85  Pulse: 90  Temp:   Resp: 14    Complications: No apparent anesthesia complications

## 2013-08-08 NOTE — H&P (Signed)
H&P documentation  -History and Physical Reviewed  -Patient has been re-examined  -No change in the plan of care  [name]

## 2013-08-08 NOTE — Op Note (Signed)
08/08/2013  8:22 AM  PATIENT:  [name]  67 y.o. male  PRE-OPERATIVE DIAGNOSIS:  NON RESTORABLE TEETH, EXOSTOSIS LEFT MAXILLA  POST-OPERATIVE DIAGNOSIS:  SAME  PROCEDURE:  Procedure(s): MULTIPLE EXTRACTION OF TEETH #1, 2, 3, 6, 7, 9, 11, 15, 16, 17, 20, 21, 22, 23, 24, 26, 29, 30, 32 WITH ALVEOLOPLASTY, REMOVALEXOSTOSIS LEFT MAXILLA   SURGEON:  Surgeon(s): Gae Bon, DDS  ANESTHESIA:   local and general  EBL:  minimal  DRAINS: none   SPECIMEN:  No Specimen  COUNTS:  YES  PLAN OF CARE: Discharge to home after PACU  PATIENT DISPOSITION:  PACU - hemodynamically stable.   PROCEDURE DETAILS: Dictation #[id number]  Gae Bon, DMD 08/08/2013 8:22 AM

## 2013-08-10 ENCOUNTER — Encounter (HOSPITAL_COMMUNITY): Payer: Self-pay | Admitting: Oral Surgery

## 2013-08-22 ENCOUNTER — Ambulatory Visit: Payer: Medicaid Other | Admitting: *Deleted

## 2013-08-22 VITALS — BP 102/71 | HR 80 | Temp 98.4°F | Resp 14

## 2013-08-22 NOTE — Patient Instructions (Signed)
Jammed Finger A jammed finger is a term used to describe a variety of injuries. The injuries usually involve the joint in the middle of the finger (not the joint near the tip of the finger, and not the joint close to the hand). Usually, a jammed finger involves injured tendons or ligaments (sprain). CAUSES  "Jamming" a finger usually refers to "stubbing" the finger on an object, such as a ball during an athletic activity. Usually, the joint is extended at the time of injury, and the blow forces the joint further into extension than it normally goes. SYMPTOMS   Pain.  Swelling.  Discoloration and bruising around the joint.  Difficulty bending, straightening, and using the finger normally. DIAGNOSIS  An X-ray may be done to make sure there is no broken bone (fracture). TREATMENT   Put ice on the injured area.  Put ice in a plastic bag.  Place a towel between your skin and the bag.  Leave the ice on for 15-20 minutes at a time, 03-04 times a day.  Raise (elevate) the affected finger above the level of your heart to decrease swelling.  Take medicine as directed by your caregiver. Depending on the type of injury, your caregiver may also recommend that you:  "Buddy tape" the injured finger to the finger or fingers beside it.  Wear a protective splint.  Do strengthening exercises after the finger has begun to heal.  Do physical therapy to regain strength and mobility in the finger.  Follow up with a hand specialist. [place]  Avoid activities that may injure the finger again until it is totally healed. SEEK IMMEDIATE MEDICAL CARE IF:   You develop pain that is more severe.  You develop increased swelling.  There is an obvious deformity in the joint.  You have severe bruising.  You have red or blue discoloration.  You or your child has an oral temperature above 102 F (38.9 C), not controlled by medicine.  You have an abnormally cold finger.  Feeling in  your finger is absent or decreasing. MAKE SURE YOU:   Understand these instructions.  Will watch your condition.  Will get help right away if you are not doing well or get worse. Document Released: 08/20/2009 Document Revised: 05/25/2011 Document Reviewed: 08/20/2009 [facility] Patient Information 2014 [place].

## 2013-08-22 NOTE — Progress Notes (Unsigned)
Patient in today stating he jammed his finger in the door. Advised patient he needs an x-ray and to continue to take OTC ibuprofen.

## 2013-08-23 ENCOUNTER — Ambulatory Visit (HOSPITAL_COMMUNITY)
Admission: RE | Admit: 2013-08-23 | Discharge: 2013-08-23 | Disposition: A | Discharge status: Home / Self Care | Payer: Medicaid Other | Source: Ambulatory Visit | Attending: Internal Medicine | Admitting: Internal Medicine

## 2013-08-23 DIAGNOSIS — S6991XA Unspecified injury of right wrist, hand and finger(s), initial encounter: Secondary | ICD-10-CM

## 2013-08-23 DIAGNOSIS — M19049 Primary osteoarthritis, unspecified hand: Secondary | ICD-10-CM | POA: Insufficient documentation

## 2013-08-23 DIAGNOSIS — M79609 Pain in unspecified limb: Secondary | ICD-10-CM | POA: Insufficient documentation

## 2013-11-10 ENCOUNTER — Ambulatory Visit: Payer: Medicaid Other | Admitting: Neurology

## 2013-11-10 ENCOUNTER — Telehealth: Payer: Self-pay | Admitting: Neurology

## 2013-11-10 NOTE — Telephone Encounter (Signed)
Pt called to r/s his NP appt with Dr. Delice Lesch on 11/10/13. He does not have transportation and is R/s until 12/11/13 at 2:30PM.

## 2013-11-10 NOTE — Telephone Encounter (Signed)
Noted  

## 2013-12-11 ENCOUNTER — Encounter: Payer: Self-pay | Admitting: Neurology

## 2013-12-11 ENCOUNTER — Ambulatory Visit (INDEPENDENT_AMBULATORY_CARE_PROVIDER_SITE_OTHER): Payer: Medicaid Other | Admitting: Neurology

## 2013-12-11 VITALS — BP 110/78 | HR 73 | Resp 18 | Ht 61.75 in | Wt 123.0 lb

## 2013-12-11 DIAGNOSIS — C61 Malignant neoplasm of prostate: Secondary | ICD-10-CM

## 2013-12-11 DIAGNOSIS — F3289 Other specified depressive episodes: Secondary | ICD-10-CM

## 2013-12-11 DIAGNOSIS — F32A Depression, unspecified: Secondary | ICD-10-CM

## 2013-12-11 DIAGNOSIS — F329 Major depressive disorder, single episode, unspecified: Secondary | ICD-10-CM

## 2013-12-11 DIAGNOSIS — R51 Headache: Secondary | ICD-10-CM

## 2013-12-11 MED ORDER — VENLAFAXINE HCL ER 37.5 MG PO CP24: ORAL_CAPSULE | ORAL | Status: DC
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
## 2013-12-11 NOTE — Patient Instructions (Signed)
1. MRI brain with and without contrast 2. Skull xray before MRI to ensure no metallic objects 3. Start Effexor XR 37.5mg : Take 1 capsule at bedtime for 1 week, then increase to 2 capsules at bedtime 4. Follow-up in 2 months

## 2013-12-13 ENCOUNTER — Other Ambulatory Visit (HOSPITAL_COMMUNITY): Payer: Medicaid Other

## 2013-12-13 ENCOUNTER — Encounter: Payer: Self-pay | Admitting: Neurology

## 2013-12-13 DIAGNOSIS — F32A Depression, unspecified: Secondary | ICD-10-CM | POA: Insufficient documentation

## 2013-12-13 DIAGNOSIS — F329 Major depressive disorder, single episode, unspecified: Secondary | ICD-10-CM | POA: Insufficient documentation

## 2013-12-13 DIAGNOSIS — C61 Malignant neoplasm of prostate: Secondary | ICD-10-CM | POA: Insufficient documentation

## 2013-12-22 ENCOUNTER — Ambulatory Visit (HOSPITAL_COMMUNITY): Payer: Medicaid Other

## 2013-12-22 ENCOUNTER — Ambulatory Visit (HOSPITAL_COMMUNITY)
Admission: RE | Admit: 2013-12-22 | Discharge: 2013-12-22 | Disposition: A | Discharge status: Home / Self Care | Payer: Medicaid Other | Source: Ambulatory Visit | Attending: Neurology | Admitting: Neurology

## 2013-12-22 DIAGNOSIS — R51 Headache: Secondary | ICD-10-CM | POA: Diagnosis not present

## 2013-12-22 LAB — BUN: BUN: 18 mg/dL (ref 6–23)

## 2013-12-22 LAB — CREATININE, SERUM
Creatinine, Ser: 1.23 mg/dL (ref 0.50–1.35)
GFR, EST AFRICAN AMERICAN: 69 mL/min — AB (ref >90)
GFR, EST NON AFRICAN AMERICAN: 59 mL/min — AB (ref >90)

## 2013-12-22 MED ORDER — GADOBENATE DIMEGLUMINE 529 MG/ML IV SOLN
13.0000 mL | Freq: Once | INTRAVENOUS | Status: AC | PRN
  Administered 2013-12-22: 10 mL via INTRAVENOUS

## 2013-12-25 ENCOUNTER — Telehealth: Payer: Self-pay | Admitting: Family Medicine
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
## 2013-12-25 NOTE — Telephone Encounter (Signed)
Pts caregiver/Edie notified of result.

## 2014-02-13 ENCOUNTER — Encounter: Payer: Self-pay | Admitting: Neurology

## 2014-02-13 ENCOUNTER — Ambulatory Visit (INDEPENDENT_AMBULATORY_CARE_PROVIDER_SITE_OTHER): Payer: Medicaid Other | Admitting: Neurology

## 2014-02-13 VITALS — BP 100/70 | HR 89 | Resp 16 | Ht 61.75 in | Wt 124.0 lb

## 2014-02-13 DIAGNOSIS — F329 Major depressive disorder, single episode, unspecified: Secondary | ICD-10-CM

## 2014-02-13 DIAGNOSIS — R51 Headache: Secondary | ICD-10-CM

## 2014-02-13 DIAGNOSIS — F32A Depression, unspecified: Secondary | ICD-10-CM

## 2014-02-13 DIAGNOSIS — R413 Other amnesia: Secondary | ICD-10-CM

## 2014-02-13 DIAGNOSIS — R519 Headache, unspecified: Secondary | ICD-10-CM

## 2014-02-13 MED ORDER — VENLAFAXINE HCL ER 37.5 MG PO CP24: ORAL_CAPSULE | ORAL | Status: DC
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
## 2014-02-13 NOTE — Patient Instructions (Signed)
1. Restart Effexor XR 37.5mg : Take 2 tablets daily 2. Speak to PCP about stomach symptoms and back pain 3. Follow-up in 3 months

## 2014-02-14 ENCOUNTER — Encounter: Payer: Self-pay | Admitting: Neurology

## 2014-02-15 NOTE — Progress Notes (Signed)
Done

## 2014-05-03 ENCOUNTER — Encounter (HOSPITAL_COMMUNITY): Payer: Self-pay | Admitting: Emergency Medicine

## 2014-05-03 ENCOUNTER — Emergency Department (HOSPITAL_COMMUNITY)
Admission: EM | Admit: 2014-05-03 | Discharge: 2014-05-04 | Disposition: A | Discharge status: Home / Self Care | Payer: Medicaid Other | Attending: Emergency Medicine | Admitting: Emergency Medicine

## 2014-05-03 ENCOUNTER — Emergency Department (HOSPITAL_COMMUNITY): Payer: Medicaid Other

## 2014-05-03 ENCOUNTER — Emergency Department (HOSPITAL_COMMUNITY)
Admission: EM | Admit: 2014-05-03 | Discharge: 2014-05-03 | Disposition: A | Discharge status: Nursing Home (Includes ICF) | Payer: Medicaid Other | Source: Home / Self Care | Attending: Emergency Medicine | Admitting: Emergency Medicine

## 2014-05-03 DIAGNOSIS — S199XXA Unspecified injury of neck, initial encounter: Secondary | ICD-10-CM | POA: Insufficient documentation

## 2014-05-03 DIAGNOSIS — K002 Abnormalities of size and form of teeth: Secondary | ICD-10-CM | POA: Diagnosis not present

## 2014-05-03 DIAGNOSIS — Y9289 Other specified places as the place of occurrence of the external cause: Secondary | ICD-10-CM | POA: Diagnosis not present

## 2014-05-03 DIAGNOSIS — Z8619 Personal history of other infectious and parasitic diseases: Secondary | ICD-10-CM | POA: Insufficient documentation

## 2014-05-03 DIAGNOSIS — Z8679 Personal history of other diseases of the circulatory system: Secondary | ICD-10-CM | POA: Insufficient documentation

## 2014-05-03 DIAGNOSIS — W1830XA Fall on same level, unspecified, initial encounter: Secondary | ICD-10-CM | POA: Insufficient documentation

## 2014-05-03 DIAGNOSIS — Y998 Other external cause status: Secondary | ICD-10-CM | POA: Diagnosis not present

## 2014-05-03 DIAGNOSIS — F329 Major depressive disorder, single episode, unspecified: Secondary | ICD-10-CM | POA: Insufficient documentation

## 2014-05-03 DIAGNOSIS — Z8546 Personal history of malignant neoplasm of prostate: Secondary | ICD-10-CM | POA: Insufficient documentation

## 2014-05-03 DIAGNOSIS — S0990XA Unspecified injury of head, initial encounter: Secondary | ICD-10-CM | POA: Insufficient documentation

## 2014-05-03 DIAGNOSIS — Z86010 Personal history of colonic polyps: Secondary | ICD-10-CM | POA: Diagnosis not present

## 2014-05-03 DIAGNOSIS — Y9389 Activity, other specified: Secondary | ICD-10-CM | POA: Diagnosis not present

## 2014-05-03 DIAGNOSIS — Z79899 Other long term (current) drug therapy: Secondary | ICD-10-CM | POA: Diagnosis not present

## 2014-05-03 DIAGNOSIS — T148 Other injury of unspecified body region: Secondary | ICD-10-CM | POA: Diagnosis not present

## 2014-05-03 DIAGNOSIS — S29002A Unspecified injury of muscle and tendon of back wall of thorax, initial encounter: Secondary | ICD-10-CM | POA: Insufficient documentation

## 2014-05-03 DIAGNOSIS — F419 Anxiety disorder, unspecified: Secondary | ICD-10-CM | POA: Diagnosis not present

## 2014-05-03 DIAGNOSIS — R630 Anorexia: Secondary | ICD-10-CM | POA: Diagnosis not present

## 2014-05-03 DIAGNOSIS — K088 Other specified disorders of teeth and supporting structures: Secondary | ICD-10-CM | POA: Insufficient documentation

## 2014-05-03 DIAGNOSIS — W19XXXA Unspecified fall, initial encounter: Secondary | ICD-10-CM

## 2014-05-03 DIAGNOSIS — Z87891 Personal history of nicotine dependence: Secondary | ICD-10-CM | POA: Insufficient documentation

## 2014-05-03 DIAGNOSIS — T07XXXA Unspecified multiple injuries, initial encounter: Secondary | ICD-10-CM

## 2014-05-03 DIAGNOSIS — R531 Weakness: Secondary | ICD-10-CM | POA: Diagnosis present

## 2014-05-03 LAB — CBC WITH DIFFERENTIAL/PLATELET
BASOS ABS: 0 10*3/uL (ref 0.0–0.1)
BASOS PCT: 1 % (ref 0–1)
Eosinophils Absolute: 0.1 10*3/uL (ref 0.0–0.7)
Eosinophils Relative: 2 % (ref 0–5)
HCT: 34.7 % — ABNORMAL LOW (ref 39.0–52.0)
Hemoglobin: 11.7 g/dL — ABNORMAL LOW (ref 13.0–17.0)
LYMPHS PCT: 40 % (ref 12–46)
Lymphs Abs: 2 10*3/uL (ref 0.7–4.0)
MCH: 32.8 pg (ref 26.0–34.0)
MCHC: 33.7 g/dL (ref 30.0–36.0)
MCV: 97.2 fL (ref 78.0–100.0)
Monocytes Absolute: 0.4 10*3/uL (ref 0.1–1.0)
Monocytes Relative: 7 % (ref 3–12)
NEUTROS PCT: 50 % (ref 43–77)
Neutro Abs: 2.4 10*3/uL (ref 1.7–7.7)
Platelets: 228 10*3/uL (ref 150–400)
RBC: 3.57 MIL/uL — ABNORMAL LOW (ref 4.22–5.81)
RDW: 11.8 % (ref 11.5–15.5)
WBC: 4.9 10*3/uL (ref 4.0–10.5)

## 2014-05-03 LAB — BASIC METABOLIC PANEL
ANION GAP: 6 (ref 5–15)
BUN: 18 mg/dL (ref 6–23)
CO2: 23 mmol/L (ref 19–32)
Calcium: 8.7 mg/dL (ref 8.4–10.5)
Chloride: 108 mmol/L (ref 96–112)
Creatinine, Ser: 1.29 mg/dL (ref 0.50–1.35)
GFR calc non Af Amer: 56 mL/min — ABNORMAL LOW (ref >90)
GFR, EST AFRICAN AMERICAN: 65 mL/min — AB (ref >90)
Glucose, Bld: 81 mg/dL (ref 70–99)
Potassium: 4 mmol/L (ref 3.5–5.1)
Sodium: 137 mmol/L (ref 135–145)

## 2014-05-03 LAB — I-STAT CHEM 8, ED
BUN: 21 mg/dL (ref 6–23)
CALCIUM ION: 1.18 mmol/L (ref 1.13–1.30)
Chloride: 106 mmol/L (ref 96–112)
Creatinine, Ser: 1.3 mg/dL (ref 0.50–1.35)
Glucose, Bld: 79 mg/dL (ref 70–99)
HCT: 37 % — ABNORMAL LOW (ref 39.0–52.0)
Hemoglobin: 12.6 g/dL — ABNORMAL LOW (ref 13.0–17.0)
Potassium: 4.1 mmol/L (ref 3.5–5.1)
Sodium: 138 mmol/L (ref 135–145)
TCO2: 21 mmol/L (ref 0–100)

## 2014-05-03 MED ORDER — SODIUM CHLORIDE 0.9 % IV BOLUS (SEPSIS)
1000.0000 mL | Freq: Once | INTRAVENOUS | Status: AC
  Administered 2014-05-04: 1000 mL via INTRAVENOUS
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
## 2014-05-03 NOTE — ED Notes (Signed)
Patient transported to CT 

## 2014-05-03 NOTE — ED Notes (Signed)
Pa  at bedside. 
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
## 2014-05-03 NOTE — ED Notes (Signed)
Friend with patient reports he is at baseline: knows what she expects him to know, no personality change-just mobility issue, typically walks
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
## 2014-05-03 NOTE — ED Notes (Signed)
BS 86

## 2014-05-03 NOTE — Discharge Instructions (Signed)
We have determined that your problem requires further evaluation in the emergency department.  We will take care of your transport there.  Once at the emergency department, you will be evaluated by a provider and they will order whatever treatment or tests they deem necessary.  We cannot guarantee that they will do any specific test or do any specific treatment.  ° °

## 2014-05-04 LAB — URINALYSIS, ROUTINE W REFLEX MICROSCOPIC
Bilirubin Urine: NEGATIVE
Glucose, UA: NEGATIVE mg/dL
Hgb urine dipstick: NEGATIVE
KETONES UR: NEGATIVE mg/dL
Leukocytes, UA: NEGATIVE
Nitrite: NEGATIVE
Protein, ur: NEGATIVE mg/dL
Specific Gravity, Urine: 1.01 (ref 1.005–1.030)
Urobilinogen, UA: 0.2 mg/dL (ref 0.0–1.0)
pH: 6 (ref 5.0–8.0)

## 2014-05-04 LAB — CBG MONITORING, ED: Glucose-Capillary: 86 mg/dL (ref 70–99)

## 2014-05-04 NOTE — ED Provider Notes (Signed)
  Face-to-face evaluation   History: Patient here for evaluation of recurrent falls.  He apparently injured his legs in the fall.  His daughter feels like when he has tooth problems this causes him to fall.  Physical exam: Alert, calm, cooperative.  He is able to move all extremities equally.  Abdomen is soft and nontender.  Medical screening examination/treatment/procedure(s) were conducted as a shared visit with non-physician practitioner(s) and myself.  I personally evaluated the patient during the encounter  Richarda Blade, MD 05/04/14 [phone]

## 2014-05-04 NOTE — ED Notes (Signed)
Pt. Left with all belongings 

## 2014-05-04 NOTE — Discharge Instructions (Signed)
Fall Prevention and Home Safety Falls cause injuries and can affect all age groups. It is possible to use preventive measures to significantly decrease the likelihood of falls. There are many simple measures which can make your home safer and prevent falls. OUTDOORS  Repair cracks and edges of walkways and driveways.  Remove high doorway thresholds.  Trim shrubbery on the main path into your home.  Have good outside lighting.  Clear walkways of tools, rocks, debris, and clutter.  Check that handrails are not broken and are securely fastened. Both sides of steps should have handrails.  Have leaves, snow, and ice cleared regularly.  Use sand or salt on walkways during winter months.  In the garage, clean up grease or oil spills. BATHROOM  Install night lights.  Install grab bars by the toilet and in the tub and shower.  Use non-skid mats or decals in the tub or shower.  Place a plastic non-slip stool in the shower to sit on, if needed.  Keep floors dry and clean up all water on the floor immediately.  Remove soap buildup in the tub or shower on a regular basis.  Secure bath mats with non-slip, double-sided rug tape.  Remove throw rugs and tripping hazards from the floors. BEDROOMS  Install night lights.  Make sure a bedside light is easy to reach.  Do not use oversized bedding.  Keep a telephone by your bedside.  Have a firm chair with side arms to use for getting dressed.  Remove throw rugs and tripping hazards from the floor. KITCHEN  Keep handles on pots and pans turned toward the center of the stove. Use back burners when possible.  Clean up spills quickly and allow time for drying.  Avoid walking on wet floors.  Avoid hot utensils and knives.  Position shelves so they are not too high or low.  Place commonly used objects within easy reach.  If necessary, use a sturdy step stool with a grab bar when reaching.  Keep electrical cables out of the  way.  Do not use floor polish or wax that makes floors slippery. If you must use wax, use non-skid floor wax.  Remove throw rugs and tripping hazards from the floor. STAIRWAYS  Never leave objects on stairs.  Place handrails on both sides of stairways and use them. Fix any loose handrails. Make sure handrails on both sides of the stairways are as long as the stairs.  Check carpeting to make sure it is firmly attached along stairs. Make repairs to worn or loose carpet promptly.  Avoid placing throw rugs at the top or bottom of stairways, or properly secure the rug with carpet tape to prevent slippage. Get rid of throw rugs, if possible.  Have an electrician put in a light switch at the top and bottom of the stairs. OTHER FALL PREVENTION TIPS  Wear low-heel or rubber-soled shoes that are supportive and fit well. Wear closed toe shoes.  When using a stepladder, make sure it is fully opened and both spreaders are firmly locked. Do not climb a closed stepladder.  Add color or contrast paint or tape to grab bars and handrails in your home. Place contrasting color strips on first and last steps.  Learn and use mobility aids as needed. Install an electrical emergency response system.  Turn on lights to avoid dark areas. Replace light bulbs that burn out immediately. Get light switches that glow.  Arrange furniture to create clear pathways. Keep furniture in the same place.  Firmly attach carpet with non-skid or double-sided tape.  Eliminate uneven floor surfaces.  Select a carpet pattern that does not visually hide the edge of steps.  Be aware of all pets. OTHER HOME SAFETY TIPS  Set the water temperature for 120 F (48.8 C).  Keep emergency numbers on or near the telephone.  Keep smoke detectors on every level of the home and near sleeping areas. Document Released: 02/20/2002 Document Revised: 09/01/2011 Document Reviewed: 05/22/2011 [facility] Patient Information 2015  [place], [place]. This information is not intended to replace advice given to you by your health care provider. Make sure you discuss any questions you have with your health care provider. General Headache Without Cause A headache is pain or discomfort felt around the head or neck area. The specific cause of a headache may not be found. There are many causes and types of headaches. A few common ones are:  Tension headaches.  Migraine headaches.  Cluster headaches.  Chronic daily headaches. HOME CARE INSTRUCTIONS   Keep all follow-up appointments with your caregiver or any specialist referral.  Only take over-the-counter or prescription medicines for pain or discomfort as directed by your caregiver.  Lie down in a dark, quiet room when you have a headache.  Keep a headache journal to find out what may trigger your migraine headaches. For example, write down:  What you eat and drink.  How much sleep you get.  Any change to your diet or medicines.  Try massage or other relaxation techniques.  Put ice packs or heat on the head and neck. Use these 3 to 4 times per day for 15 to 20 minutes each time, or as needed.  Limit stress.  Sit up straight, and do not tense your muscles.  Quit smoking if you smoke.  Limit alcohol use.  Decrease the amount of caffeine you drink, or stop drinking caffeine.  Eat and sleep on a regular schedule.  Get 7 to 9 hours of sleep, or as recommended by your caregiver.  Keep lights dim if bright lights bother you and make your headaches worse. SEEK MEDICAL CARE IF:   You have problems with the medicines you were prescribed.  Your medicines are not working.  You have a change from the usual headache.  You have nausea or vomiting. SEEK IMMEDIATE MEDICAL CARE IF:   Your headache becomes severe.  You have a fever.  You have a stiff neck.  You have loss of vision.  You have muscular weakness or loss of muscle control.  You start losing  your balance or have trouble walking.  You feel faint or pass out.  You have severe symptoms that are different from your first symptoms. MAKE SURE YOU:   Understand these instructions.  Will watch your condition.  Will get help right away if you are not doing well or get worse. Document Released: 03/02/2005 Document Revised: 05/25/2011 Document Reviewed: 03/18/2011 [facility] Patient Information 2015 [place], [place]. This information is not intended to replace advice given to you by your health care provider. Make sure you discuss any questions you have with your health care provider.

## 2014-05-06 ENCOUNTER — Emergency Department (HOSPITAL_COMMUNITY): Payer: Medicaid Other

## 2014-05-06 ENCOUNTER — Observation Stay (HOSPITAL_COMMUNITY)
Admission: EM | Admit: 2014-05-06 | Discharge: 2014-05-11 | Disposition: A | Discharge status: Skilled Nursing Facility | Payer: Medicaid Other | Attending: Internal Medicine | Admitting: Internal Medicine

## 2014-05-06 ENCOUNTER — Encounter (HOSPITAL_COMMUNITY): Payer: Self-pay | Admitting: Emergency Medicine

## 2014-05-06 DIAGNOSIS — R52 Pain, unspecified: Secondary | ICD-10-CM

## 2014-05-06 DIAGNOSIS — K921 Melena: Secondary | ICD-10-CM

## 2014-05-06 DIAGNOSIS — K922 Gastrointestinal hemorrhage, unspecified: Secondary | ICD-10-CM

## 2014-05-06 DIAGNOSIS — R339 Retention of urine, unspecified: Secondary | ICD-10-CM | POA: Diagnosis not present

## 2014-05-06 DIAGNOSIS — G8929 Other chronic pain: Secondary | ICD-10-CM | POA: Insufficient documentation

## 2014-05-06 DIAGNOSIS — Z87891 Personal history of nicotine dependence: Secondary | ICD-10-CM | POA: Insufficient documentation

## 2014-05-06 DIAGNOSIS — G44309 Post-traumatic headache, unspecified, not intractable: Secondary | ICD-10-CM | POA: Insufficient documentation

## 2014-05-06 DIAGNOSIS — F329 Major depressive disorder, single episode, unspecified: Secondary | ICD-10-CM | POA: Diagnosis not present

## 2014-05-06 DIAGNOSIS — Z8546 Personal history of malignant neoplasm of prostate: Secondary | ICD-10-CM | POA: Insufficient documentation

## 2014-05-06 DIAGNOSIS — M549 Dorsalgia, unspecified: Secondary | ICD-10-CM | POA: Diagnosis not present

## 2014-05-06 DIAGNOSIS — R195 Other fecal abnormalities: Secondary | ICD-10-CM

## 2014-05-06 DIAGNOSIS — D649 Anemia, unspecified: Secondary | ICD-10-CM | POA: Insufficient documentation

## 2014-05-06 DIAGNOSIS — W19XXXA Unspecified fall, initial encounter: Secondary | ICD-10-CM | POA: Insufficient documentation

## 2014-05-06 DIAGNOSIS — M5136 Other intervertebral disc degeneration, lumbar region: Secondary | ICD-10-CM | POA: Diagnosis not present

## 2014-05-06 DIAGNOSIS — M25551 Pain in right hip: Secondary | ICD-10-CM | POA: Insufficient documentation

## 2014-05-06 DIAGNOSIS — F32A Depression, unspecified: Secondary | ICD-10-CM | POA: Diagnosis present

## 2014-05-06 DIAGNOSIS — M48061 Spinal stenosis, lumbar region without neurogenic claudication: Secondary | ICD-10-CM

## 2014-05-06 DIAGNOSIS — M79606 Pain in leg, unspecified: Secondary | ICD-10-CM

## 2014-05-06 DIAGNOSIS — C61 Malignant neoplasm of prostate: Secondary | ICD-10-CM

## 2014-05-06 DIAGNOSIS — K648 Other hemorrhoids: Secondary | ICD-10-CM

## 2014-05-06 LAB — URINALYSIS, ROUTINE W REFLEX MICROSCOPIC
Bilirubin Urine: NEGATIVE
Glucose, UA: NEGATIVE mg/dL
HGB URINE DIPSTICK: NEGATIVE
KETONES UR: NEGATIVE mg/dL
Leukocytes, UA: NEGATIVE
Nitrite: NEGATIVE
Protein, ur: NEGATIVE mg/dL
Specific Gravity, Urine: 1.009 (ref 1.005–1.030)
Urobilinogen, UA: 0.2 mg/dL (ref 0.0–1.0)
pH: 7.5 (ref 5.0–8.0)

## 2014-05-06 LAB — I-STAT CHEM 8, ED
BUN: 17 mg/dL (ref 6–23)
CALCIUM ION: 1.12 mmol/L — AB (ref 1.13–1.30)
Chloride: 105 mmol/L (ref 96–112)
Creatinine, Ser: 1 mg/dL (ref 0.50–1.35)
GLUCOSE: 100 mg/dL — AB (ref 70–99)
HCT: 41 % (ref 39.0–52.0)
HEMOGLOBIN: 13.9 g/dL (ref 13.0–17.0)
POTASSIUM: 4 mmol/L (ref 3.5–5.1)
Sodium: 140 mmol/L (ref 135–145)
TCO2: 22 mmol/L (ref 0–100)

## 2014-05-06 LAB — CBC WITH DIFFERENTIAL/PLATELET
Basophils Absolute: 0.1 10*3/uL (ref 0.0–0.1)
Basophils Relative: 1 % (ref 0–1)
Eosinophils Absolute: 0.1 10*3/uL (ref 0.0–0.7)
Eosinophils Relative: 1 % (ref 0–5)
HCT: 37.2 % — ABNORMAL LOW (ref 39.0–52.0)
HEMOGLOBIN: 12.5 g/dL — AB (ref 13.0–17.0)
LYMPHS PCT: 32 % (ref 12–46)
Lymphs Abs: 1.6 10*3/uL (ref 0.7–4.0)
MCH: 32.4 pg (ref 26.0–34.0)
MCHC: 33.6 g/dL (ref 30.0–36.0)
MCV: 96.4 fL (ref 78.0–100.0)
MONO ABS: 0.5 10*3/uL (ref 0.1–1.0)
Monocytes Relative: 9 % (ref 3–12)
NEUTROS ABS: 2.9 10*3/uL (ref 1.7–7.7)
NEUTROS PCT: 57 % (ref 43–77)
PLATELETS: 247 10*3/uL (ref 150–400)
RBC: 3.86 MIL/uL — AB (ref 4.22–5.81)
RDW: 11.7 % (ref 11.5–15.5)
WBC: 5.1 10*3/uL (ref 4.0–10.5)

## 2014-05-06 MED ORDER — VENLAFAXINE HCL ER 75 MG PO CP24
75.0000 mg | ORAL_CAPSULE | Freq: Every day | ORAL | Status: DC
  Administered 2014-05-07 – 2014-05-11 (×5): 75 mg via ORAL
  Filled 2014-05-06 (×5): qty 1

## 2014-05-06 MED ORDER — HYDROMORPHONE HCL 1 MG/ML IJ SOLN
0.5000 mg | Freq: Four times a day (QID) | INTRAMUSCULAR | Status: DC | PRN
  Administered 2014-05-06 – 2014-05-07 (×2): 0.5 mg via INTRAVENOUS
  Filled 2014-05-06 (×2): qty 1

## 2014-05-06 MED ORDER — MORPHINE SULFATE 4 MG/ML IJ SOLN
4.0000 mg | Freq: Once | INTRAMUSCULAR | Status: AC
  Administered 2014-05-06: 4 mg via INTRAVENOUS
  Filled 2014-05-06: qty 1

## 2014-05-06 MED ORDER — TRAMADOL HCL 50 MG PO TABS
50.0000 mg | ORAL_TABLET | Freq: Once | ORAL | Status: AC
  Administered 2014-05-06: 50 mg via ORAL
  Filled 2014-05-06: qty 1

## 2014-05-06 MED ORDER — SODIUM CHLORIDE 0.9 % IJ SOLN
3.0000 mL | Freq: Two times a day (BID) | INTRAMUSCULAR | Status: DC
  Administered 2014-05-06 – 2014-05-10 (×8): 3 mL via INTRAVENOUS

## 2014-05-06 NOTE — ED Notes (Signed)
Patient is c/o of neck, back and right leg pain.  MD Steinl at bedside.

## 2014-05-06 NOTE — ED Notes (Signed)
Pt is in a gown and on the monitor. 

## 2014-05-06 NOTE — ED Notes (Signed)
This RN walked in to patients room to find that patient is standing up at bedside.  This RN advised patient and english speaking family member that until he is cleared from x-rays and CT patient is not allowed to stand up.  Family member at bedside telling patient to sit back in the bed.  This RN states that until patient is cleared of injuries, if he is standing this is against our medical recommendation until EDP clears from exams performed.  Patient provided a urinal.
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
## 2014-05-06 NOTE — ED Notes (Signed)
Pts daughter at bedside. Advised no food/drink at this time.  MRI is scheduled. Daughter translated spelling of name and DOB as translator could not get pt to answer this question d/t language barrier between translator and pt.

## 2014-05-06 NOTE — ED Notes (Signed)
Patient transported to CT 
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
## 2014-05-07 ENCOUNTER — Observation Stay (HOSPITAL_COMMUNITY): Payer: Medicaid Other

## 2014-05-07 LAB — CBC
HCT: 38.5 % — ABNORMAL LOW (ref 39.0–52.0)
HEMOGLOBIN: 13 g/dL (ref 13.0–17.0)
MCH: 33.2 pg (ref 26.0–34.0)
MCHC: 33.8 g/dL (ref 30.0–36.0)
MCV: 98.2 fL (ref 78.0–100.0)
PLATELETS: 256 10*3/uL (ref 150–400)
RBC: 3.92 MIL/uL — AB (ref 4.22–5.81)
RDW: 12 % (ref 11.5–15.5)
WBC: 8.1 10*3/uL (ref 4.0–10.5)

## 2014-05-07 LAB — COMPREHENSIVE METABOLIC PANEL
ALK PHOS: 105 U/L (ref 39–117)
ALT: 43 U/L (ref 0–53)
AST: 33 U/L (ref 0–37)
Albumin: 4 g/dL (ref 3.5–5.2)
Anion gap: 6 (ref 5–15)
BILIRUBIN TOTAL: 0.7 mg/dL (ref 0.3–1.2)
BUN: 16 mg/dL (ref 6–23)
CALCIUM: 9.5 mg/dL (ref 8.4–10.5)
CO2: 27 mmol/L (ref 19–32)
CREATININE: 1.22 mg/dL (ref 0.50–1.35)
Chloride: 105 mmol/L (ref 96–112)
GFR calc Af Amer: 69 mL/min — ABNORMAL LOW (ref >90)
GFR, EST NON AFRICAN AMERICAN: 60 mL/min — AB (ref >90)
Glucose, Bld: 136 mg/dL — ABNORMAL HIGH (ref 70–99)
Potassium: 4.9 mmol/L (ref 3.5–5.1)
Sodium: 138 mmol/L (ref 135–145)
Total Protein: 7.2 g/dL (ref 6.0–8.3)

## 2014-05-07 LAB — GLUCOSE, CAPILLARY
GLUCOSE-CAPILLARY: 124 mg/dL — AB (ref 70–99)
Glucose-Capillary: 156 mg/dL — ABNORMAL HIGH (ref 70–99)

## 2014-05-07 MED ORDER — HYDROCODONE-ACETAMINOPHEN 5-325 MG PO TABS
1.0000 | ORAL_TABLET | Freq: Four times a day (QID) | ORAL | Status: DC | PRN
  Administered 2014-05-07 – 2014-05-10 (×6): 1 via ORAL
  Filled 2014-05-07 (×6): qty 1

## 2014-05-07 MED ORDER — PROMETHAZINE HCL 25 MG/ML IJ SOLN
6.2500 mg | Freq: Three times a day (TID) | INTRAMUSCULAR | Status: DC | PRN
  Administered 2014-05-07: 6.25 mg via INTRAVENOUS
  Filled 2014-05-07: qty 1

## 2014-05-07 MED ORDER — CYCLOBENZAPRINE HCL 10 MG PO TABS
5.0000 mg | ORAL_TABLET | Freq: Three times a day (TID) | ORAL | Status: DC | PRN
  Administered 2014-05-08: 5 mg via ORAL
  Filled 2014-05-07: qty 1

## 2014-05-07 MED ORDER — ENSURE COMPLETE PO LIQD
237.0000 mL | Freq: Two times a day (BID) | ORAL | Status: DC
Start: 2014-05-07 — End: 2014-05-11
  Administered 2014-05-07 – 2014-05-11 (×6): 237 mL via ORAL
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
## 2014-05-07 NOTE — Progress Notes (Addendum)
PT Cancellation Note  Patient Details Name: Omid Deardorff MRN: [id number] DOB: October 06, 1945   Cancelled Treatment:    Reason Eval/Treat Not Completed: Other (comment).  Just spoke with MD on the phone and he has ordered leg films (x-rays) due to pt report of leg pain.  They have not been done/posted yet.  PT will keep checking back for results.  After results have posted, PT to see for d/c needs.    Thanks,    Barbarann Ehlers. Tamey Wanek, PT, DPT [phone]   05/07/2014, 11:08 AM   Checked back at 1328 and pt is now leaving with transporters to go to x-ray.  Will check back again later as time allows.  Thanks,  Barbarann Ehlers. [place], [place], DPT [phone]
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
## 2014-05-07 NOTE — Progress Notes (Signed)
UR completed 

## 2014-05-08 MED ORDER — TAMSULOSIN HCL 0.4 MG PO CAPS
0.4000 mg | ORAL_CAPSULE | Freq: Every day | ORAL | Status: DC
  Administered 2014-05-08 – 2014-05-11 (×4): 0.4 mg via ORAL
  Filled 2014-05-08 (×4): qty 1

## 2014-05-08 MED ORDER — SENNA 8.6 MG PO TABS
1.0000 | ORAL_TABLET | Freq: Every day | ORAL | Status: DC | PRN
  Administered 2014-05-09: 8.6 mg via ORAL
  Filled 2014-05-08: qty 1

## 2014-05-08 NOTE — Progress Notes (Signed)
Talked to patient with daughter and girlfriend; Patient is Observational status and does not qualify for SNF placement as daughter requested; CM talked to daughter/girlfriend about [place] services and providing a list for private sitters to help at home; Daughter with difficulty processing information/ Social Worker present to explain why patient does not qualify for SNF and observational status - I do not think that they completely understand the process and want him to stay in the hospital until he is 100% better; Attempted to have an interpreter present but daughter refused. Girlfriend does not speak the patient's language and states that she communicates with him on a different level.  CM and SW will attempt to talk to the patient/daughter / girlfriend again about DCP / Observational status / the qualifications for SNF placement / [place] and a private sitter list to assist in his care; CM also encouraged the daughter and girlfriend to help as much as possible
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
## 2014-05-08 NOTE — Progress Notes (Addendum)
Talked to patient's girlfriend again, she stated that she would take the patient to her home at discharge and it would be fine for the CM to arrange [facility] services; Patient/ girlfriend chose [facility] for [facility] services; Attending MD at discharge please order [facility] / PT/ OT/ nurses aide / SW; CM will continue to follow case : Aneta Mins [phone]

## 2014-05-08 NOTE — Progress Notes (Signed)
Received call from [facility], they will not offer the service of HHPT without a qualifying diagnosis for Medicaid patient and do not honor the 4 visit of HHPT per their guidelines;  [facility] with [facility] called for [facility] referral, awaiting a call back; Aneta Mins [phone]

## 2014-05-08 NOTE — Progress Notes (Signed)
UR completed 
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
## 2014-05-08 NOTE — Progress Notes (Signed)
Pt was I&O cath'd x2 today, output was 475 cc and 250 cc.

## 2014-05-08 NOTE — Clinical Social Work Note (Addendum)
CSW Consult Acknowledged:   CSW received a consult for SNF placement. Per PT/OT evaluation at this time the appropriate level of care recommended is HH. CSW will sign off.        Addendum:   CSW spoke with MD in detail regarding Medicaid SNF placement. CSW explained PT is a service which is not covered under Medicaid. CSW explained to the MD that CSW can not guarantee the pt will receive PT if he transition to a SNF. MD inquired about intermittent service. CSW contacted a nursing home admission coordintor to seek clarity regardingintermittent service. CSW provided the information given back to the MD. MD inquired about CIR. CSW encouraged MD to contact CIR to address specified questions regard CIR admissions. CSW will sign off.      [place], MSW, [place]

## 2014-05-08 NOTE — Progress Notes (Signed)
Pt had c/o of urinary retention.  Bladder scanned showed 440 ml.  In and out performed and 600 ml came out.  Will continue to monitor.    Fredrich Romans, RN 05/08/2014 4:41 AM
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
## 2014-05-09 DIAGNOSIS — K922 Gastrointestinal hemorrhage, unspecified: Secondary | ICD-10-CM

## 2014-05-09 DIAGNOSIS — R42 Dizziness and giddiness: Secondary | ICD-10-CM

## 2014-05-09 DIAGNOSIS — R195 Other fecal abnormalities: Secondary | ICD-10-CM

## 2014-05-09 LAB — PSA: PSA: 0.43 ng/mL (ref <=4.00)

## 2014-05-09 LAB — CBC
HCT: 34.9 % — ABNORMAL LOW (ref 39.0–52.0)
Hemoglobin: 11.7 g/dL — ABNORMAL LOW (ref 13.0–17.0)
MCH: 32.7 pg (ref 26.0–34.0)
MCHC: 33.5 g/dL (ref 30.0–36.0)
MCV: 97.5 fL (ref 78.0–100.0)
PLATELETS: 246 10*3/uL (ref 150–400)
RBC: 3.58 MIL/uL — AB (ref 4.22–5.81)
RDW: 12.2 % (ref 11.5–15.5)
WBC: 9 10*3/uL (ref 4.0–10.5)

## 2014-05-09 LAB — OCCULT BLOOD X 1 CARD TO LAB, STOOL: Fecal Occult Bld: POSITIVE — AB

## 2014-05-09 MED ORDER — TAMSULOSIN HCL 0.4 MG PO CAPS: 0.4000 mg | ORAL_CAPSULE | Freq: Every day | ORAL | Status: DC

## 2014-05-09 MED ORDER — SENNA 8.6 MG PO TABS
1.0000 | ORAL_TABLET | Freq: Every day | ORAL | Status: DC | PRN
Start: 2014-05-09 — End: 2016-12-24

## 2014-05-09 MED ORDER — HYDROCODONE-ACETAMINOPHEN 5-325 MG PO TABS: 1.0000 | ORAL_TABLET | Freq: Four times a day (QID) | ORAL | Status: DC | PRN
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
## 2014-05-09 NOTE — Progress Notes (Signed)
Patient voided 122ml of clear urine with a bright red stool. Patient was straining in the bathroom during episode. MD was notified and also witness the stool. Bladder scan was performed for PVR with result of 13ml noted.  Will continue to monitor. Tomma Rakers RN

## 2014-05-09 NOTE — Progress Notes (Signed)
Pt had a large bowel movement with moderate blood.  MD notified and orders given for occult stool sample and CBC.  Pt attempted to have a bowel movement prior and was physically straining.  Occult stool sample sent to lab.  Will continue to monitor.

## 2014-05-09 NOTE — Progress Notes (Signed)
Patient voided in restroom prior to catheterization. Patient was i&o cath'd as ordered.Output was 100cc. Patient tolerated procedure well. Tomma Rakers RN
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
## 2014-05-09 NOTE — Progress Notes (Signed)
Patient voided 231ml with PVR of 65ml noted. Will continue to monitor. Tomma Rakers RN

## 2014-05-09 NOTE — Progress Notes (Signed)
Discussed hospital urethral catheter protocol with MD in conjunction with Aisha, RN. The MD refuses foley to be placed d/t concern over patient pulling it out. He requests continued in and out catheterizations until patient discharge.   Meyer Cory, RN ADON

## 2014-05-09 NOTE — Progress Notes (Signed)
UR completed 
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
## 2014-05-09 NOTE — Progress Notes (Signed)
MD informed RN to performed in and out catheterization on patient. RN informed MD about urethral catheter protocol and MD stated not to placed catheter and performed in and out procedure. Tomma Rakers RN

## 2014-05-09 NOTE — Progress Notes (Signed)
Pt did not void since last I&O cath at 6:30 PM, bladder scanned showed 370 ml.  MD gave orders to I&O and the output was 500 ml.

## 2014-05-10 DIAGNOSIS — K921 Melena: Secondary | ICD-10-CM

## 2014-05-10 DIAGNOSIS — D649 Anemia, unspecified: Secondary | ICD-10-CM

## 2014-05-10 DIAGNOSIS — K648 Other hemorrhoids: Secondary | ICD-10-CM

## 2014-05-10 DIAGNOSIS — M549 Dorsalgia, unspecified: Secondary | ICD-10-CM

## 2014-05-10 LAB — CBC
HCT: 35.6 % — ABNORMAL LOW (ref 39.0–52.0)
Hemoglobin: 11.7 g/dL — ABNORMAL LOW (ref 13.0–17.0)
MCH: 32.1 pg (ref 26.0–34.0)
MCHC: 32.9 g/dL (ref 30.0–36.0)
MCV: 97.5 fL (ref 78.0–100.0)
PLATELETS: 277 10*3/uL (ref 150–400)
RBC: 3.65 MIL/uL — ABNORMAL LOW (ref 4.22–5.81)
RDW: 12.1 % (ref 11.5–15.5)
WBC: 7.3 10*3/uL (ref 4.0–10.5)

## 2014-05-10 MED ORDER — HYDROCORTISONE 2.5 % RE CREA
TOPICAL_CREAM | Freq: Two times a day (BID) | RECTAL | Status: DC
  Administered 2014-05-10 (×2): via RECTAL
  Filled 2014-05-10: qty 28.35

## 2014-05-10 NOTE — Progress Notes (Signed)
UR completed 

## 2014-05-10 NOTE — Clinical Social Work Placement (Addendum)
Clinical Social Work Department CLINICAL SOCIAL WORK PLACEMENT NOTE 05/10/2014  Patient:  [name], [name]  Account Number:  [id number] Admit date:  05/06/2014  Clinical Social Worker:  Greta Doom, LCSWA  Date/time:  05/09/2014 01:49 PM  Clinical Social Work is seeking post-discharge placement for this patient at the following level of care:   SKILLED NURSING   (*CSW will update this form in Epic as items are completed)   05/09/2014  Patient/family provided with [place] Department of Clinical Social Work's list of facilities offering this level of care within the geographic area requested by the patient (or if unable, by the patient's family).  05/09/2014  Patient/family informed of their freedom to choose among providers that offer the needed level of care, that participate in Medicare, Medicaid or managed care program needed by the patient, have an available bed and are willing to accept the patient.  05/09/2014  Patient/family informed of [facility]' ownership interest in [facility], as well as of the fact that they are under no obligation to receive care at this facility.  PASARR submitted to EDS on 05/09/2014 PASARR number received on 05/09/2014  FL2 transmitted to all facilities in geographic area requested by pt/family on  05/09/2014 FL2 transmitted to all facilities within larger geographic area on 05/09/2014  Patient informed that his/her managed care company has contracts with or will negotiate with  certain facilities, including the following:     Patient/family informed of bed offers received: 05/11/2014  Patient chooses bed at [place] recommends and patient chooses bed at    Patient to be transferred to [facility] on 05/11/2014   Patient to be transferred to facility by [facility]  Patient and family notified of transfer on 05/11/2014 Name of family member notified:  Reece Levy daughter   The following physician request were entered in  Epic:   Additional Comments:  Greta Doom, MSW, [place]

## 2014-05-10 NOTE — Progress Notes (Signed)
Patient expressed that he prefers soft food r/t all his teeth has been removed. Awaiting for denture per significant other. Soft diet ordered.   Ave Filter, RN
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
## 2014-05-10 NOTE — Progress Notes (Signed)
Pt had bright red loose stool and MD was made aware. Pt. Has active hemorrhoid and will continue to monitor. Ruben Gottron, [place] 05/10/2014 3:42 AM
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
## 2014-05-11 DIAGNOSIS — R339 Retention of urine, unspecified: Secondary | ICD-10-CM | POA: Diagnosis not present

## 2014-05-11 DIAGNOSIS — M549 Dorsalgia, unspecified: Secondary | ICD-10-CM | POA: Diagnosis not present

## 2014-05-11 DIAGNOSIS — G8929 Other chronic pain: Secondary | ICD-10-CM | POA: Diagnosis not present

## 2014-05-11 DIAGNOSIS — M25551 Pain in right hip: Secondary | ICD-10-CM | POA: Diagnosis not present

## 2014-05-11 LAB — CBC
HEMATOCRIT: 36.4 % — AB (ref 39.0–52.0)
HEMOGLOBIN: 12.2 g/dL — AB (ref 13.0–17.0)
MCH: 32.4 pg (ref 26.0–34.0)
MCHC: 33.5 g/dL (ref 30.0–36.0)
MCV: 96.6 fL (ref 78.0–100.0)
Platelets: 283 10*3/uL (ref 150–400)
RBC: 3.77 MIL/uL — ABNORMAL LOW (ref 4.22–5.81)
RDW: 11.9 % (ref 11.5–15.5)
WBC: 6 10*3/uL (ref 4.0–10.5)

## 2014-05-11 LAB — TROPONIN I: Troponin I: <0.03 ng/mL (ref <0.031)

## 2014-05-11 MED ORDER — HYDROCORTISONE 2.5 % RE CREA: TOPICAL_CREAM | Freq: Two times a day (BID) | RECTAL | Status: DC

## 2014-05-11 MED ORDER — SODIUM CHLORIDE 0.9 % IV SOLN
INTRAVENOUS | Status: AC
  Administered 2014-05-11: 03:00:00 via INTRAVENOUS

## 2014-05-11 NOTE — Progress Notes (Addendum)
Interpretor phoned.  Pt complaining of dizziness upon standing.  Pt denies any other symptoms.  Vitals and assessment stable.  Pt encouraged to drink fluids and not make fast or sudden movements when ambulating or standing.  MD made aware.  Orders received for orthostatic vital signs

## 2014-05-11 NOTE — Progress Notes (Signed)
Report given to [facility] from [facility]. All belongings sent with dght and significant other.  Patient already ate lunch. Awaiting for transport.   Ave Filter, RN
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
## 2014-05-11 NOTE — Discharge Instructions (Signed)
It was a pleasure to care for you at [facility]. We have started a new medication called flomax (tamsulin) to be taken daily to help you urinate. You also have a pain killer pill called norco/vicodin that you can take every six hours for your aching. Please continue the anusol suppository in your bottom twice a day through March 3rd then once a day as needed. Please call [facility] [phone]) if you have more bleeding. Please return to the Emergency Department or seek medical attention if you have any new or worsening trouble urinating, weakness, or other worrisome medical condition.   Lottie Mussel, MD
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
## 2014-05-11 NOTE — Clinical Social Work Note (Signed)
CSW call Nikki at [facility] to follow up on bed offer. Lexine Baton reported she has not talk to her boss(Debbie) yet. Lexine Baton reported that she will call Debbie this morning. CSW contact CSW's Surveyor, quantity to requested he make a follow up call to [facility]. CSW will continue to follow this pt and aid with discharge needs.   [place], MSW, [place]

## 2014-05-15 ENCOUNTER — Telehealth: Payer: Self-pay | Admitting: Neurology

## 2014-05-15 ENCOUNTER — Non-Acute Institutional Stay (SKILLED_NURSING_FACILITY): Payer: Medicaid Other | Admitting: Internal Medicine

## 2014-05-15 ENCOUNTER — Encounter: Payer: Self-pay | Admitting: Internal Medicine

## 2014-05-15 DIAGNOSIS — K625 Hemorrhage of anus and rectum: Secondary | ICD-10-CM | POA: Diagnosis not present

## 2014-05-15 DIAGNOSIS — C61 Malignant neoplasm of prostate: Secondary | ICD-10-CM

## 2014-05-15 DIAGNOSIS — D638 Anemia in other chronic diseases classified elsewhere: Secondary | ICD-10-CM

## 2014-05-15 DIAGNOSIS — N401 Enlarged prostate with lower urinary tract symptoms: Secondary | ICD-10-CM

## 2014-05-15 DIAGNOSIS — N138 Other obstructive and reflux uropathy: Secondary | ICD-10-CM
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
## 2014-05-15 NOTE — Telephone Encounter (Signed)
Spoke to [place]. Reports patient is at [facility], doing a lot better, spirits were really good. He cannot walk yet without support. He will be there for another 2 weeks.
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
## 2014-05-15 NOTE — Telephone Encounter (Signed)
Left VM for [facility]
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
## 2014-05-15 NOTE — Assessment & Plan Note (Signed)
Baseline Hb 12.0;MCV 96.6 but B12 and folate very good

## 2014-05-15 NOTE — Assessment & Plan Note (Signed)
[name]  [name] was complaining of urinary retention. His abdomen is distended. Flomax was started 2/24. We began measuring postvoid residuals each RN shift and in and out cath if >200 cc. Overnight in and out cath was 553 cc urine. Other previous post-void residual 600 cc. Before discharge, he voided 175 cc and bladder scan was 75 cc. He was receiving flexeril for his pain but this was stopped due to concerns it can have anti-cholinergic effect. PSA is normal at 0.43. When asked by interpreter, he would not answer questions about his history of prostate cancer. His significant other notes that she has been with him for four years and he was treated with radiation before she met him for this issue. She does not think that there has been any issue following his radiation treatments. She says he has had follow-up but does not know the physician and cannot locate in EMR. There is concern that if [name] were discharged home with Foley, he could remove it and cause trauma. Additionally, family is concerned that he could not adequately manage to in and out self-catheterize on his own at home. SNF physician should arrange local out-patient urology follow-up within one week. Defer to SNF physician regarding whether on reassessment patient requires a Foley versus in and out catheterization

## 2014-05-15 NOTE — Assessment & Plan Note (Signed)
Baseline around Hb 12; MCV 96.6 but folate and B12 very healthy

## 2014-05-15 NOTE — Assessment & Plan Note (Signed)
Per SO, at least > 75yrs ago, had XRT, nothing else is known

## 2014-05-15 NOTE — Assessment & Plan Note (Signed)
:   RN noted about a "half dollar" size of blood in bowel movement last night. CBC this morning with hemoglobin 11.7, was 12.5 on presentation and previously 11.7 on ED visit 2/18. He had flexible sigmoidoscopy 1/14 w bleeding internal hemorrhoids s/p band ligation 2014. He had colonoscopy 06/2010 with internal hemorrhoids noted and four polyps removed. No anemia panel. SNF physician should refer to a local out-patient GI clinic.

## 2014-05-16 ENCOUNTER — Ambulatory Visit: Payer: Medicaid Other | Admitting: Neurology

## 2014-05-16 NOTE — Telephone Encounter (Signed)
Noted  

## 2014-05-17 ENCOUNTER — Encounter: Payer: Self-pay | Admitting: Internal Medicine

## 2014-05-17 ENCOUNTER — Inpatient Hospital Stay: Payer: Medicaid Other | Admitting: Family Medicine

## 2014-05-17 ENCOUNTER — Non-Acute Institutional Stay (SKILLED_NURSING_FACILITY): Payer: Medicaid Other | Admitting: Internal Medicine

## 2014-05-17 DIAGNOSIS — D638 Anemia in other chronic diseases classified elsewhere: Secondary | ICD-10-CM | POA: Diagnosis not present

## 2014-05-17 DIAGNOSIS — M549 Dorsalgia, unspecified: Secondary | ICD-10-CM | POA: Diagnosis not present

## 2014-05-17 DIAGNOSIS — K648 Other hemorrhoids: Secondary | ICD-10-CM

## 2014-05-17 DIAGNOSIS — K625 Hemorrhage of anus and rectum: Secondary | ICD-10-CM
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
## 2014-05-24 ENCOUNTER — Non-Acute Institutional Stay (SKILLED_NURSING_FACILITY): Payer: Medicaid Other | Admitting: Internal Medicine

## 2014-05-24 DIAGNOSIS — M25569 Pain in unspecified knee: Secondary | ICD-10-CM | POA: Insufficient documentation

## 2014-05-24 DIAGNOSIS — M5136 Other intervertebral disc degeneration, lumbar region: Secondary | ICD-10-CM

## 2014-05-24 DIAGNOSIS — K625 Hemorrhage of anus and rectum: Secondary | ICD-10-CM
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
## 2014-05-28 ENCOUNTER — Non-Acute Institutional Stay (SKILLED_NURSING_FACILITY): Payer: Medicaid Other | Admitting: Internal Medicine

## 2014-05-28 DIAGNOSIS — M549 Dorsalgia, unspecified: Secondary | ICD-10-CM | POA: Diagnosis not present

## 2014-05-28 DIAGNOSIS — C61 Malignant neoplasm of prostate: Secondary | ICD-10-CM

## 2014-05-28 DIAGNOSIS — M25569 Pain in unspecified knee: Secondary | ICD-10-CM

## 2014-05-28 DIAGNOSIS — M5136 Other intervertebral disc degeneration, lumbar region: Secondary | ICD-10-CM | POA: Diagnosis not present

## 2014-05-28 DIAGNOSIS — N138 Other obstructive and reflux uropathy: Secondary | ICD-10-CM

## 2014-05-28 DIAGNOSIS — N401 Enlarged prostate with lower urinary tract symptoms: Secondary | ICD-10-CM
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
## 2014-06-18 ENCOUNTER — Ambulatory Visit (INDEPENDENT_AMBULATORY_CARE_PROVIDER_SITE_OTHER): Payer: Medicaid Other | Admitting: Neurology

## 2014-06-18 ENCOUNTER — Encounter: Payer: Self-pay | Admitting: Neurology

## 2014-06-18 VITALS — BP 122/72 | HR 83 | Resp 16 | Ht 61.75 in | Wt 123.0 lb

## 2014-06-18 DIAGNOSIS — F32A Depression, unspecified: Secondary | ICD-10-CM

## 2014-06-18 DIAGNOSIS — R413 Other amnesia: Secondary | ICD-10-CM | POA: Diagnosis not present

## 2014-06-18 DIAGNOSIS — F329 Major depressive disorder, single episode, unspecified: Secondary | ICD-10-CM | POA: Diagnosis not present

## 2014-06-18 MED ORDER — VENLAFAXINE HCL ER 37.5 MG PO CP24: ORAL_CAPSULE | ORAL | Status: DC
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
## 2014-06-18 NOTE — Patient Instructions (Signed)
1. Restart Effexor XR 37.5mg : Take 1 capsule daily 2. Contact new Family Physician and follow-up as soon as possible 3. Follow-up in 6 months

## 2014-06-21 ENCOUNTER — Telehealth: Payer: Self-pay | Admitting: Emergency Medicine

## 2014-06-21 NOTE — Telephone Encounter (Signed)
SIGN AND FAX BACK

## 2014-06-24 DIAGNOSIS — R413 Other amnesia: Secondary | ICD-10-CM | POA: Insufficient documentation

## 2014-07-06 ENCOUNTER — Ambulatory Visit: Payer: Medicaid Other | Attending: Family Medicine | Admitting: Family Medicine

## 2014-07-06 ENCOUNTER — Encounter: Payer: Self-pay | Admitting: Family Medicine

## 2014-07-06 VITALS — BP 116/75 | HR 83 | Temp 97.5°F | Resp 18 | Ht 61.75 in | Wt 125.0 lb

## 2014-07-06 DIAGNOSIS — M545 Low back pain, unspecified: Secondary | ICD-10-CM | POA: Insufficient documentation

## 2014-07-06 DIAGNOSIS — D638 Anemia in other chronic diseases classified elsewhere: Secondary | ICD-10-CM | POA: Insufficient documentation

## 2014-07-06 DIAGNOSIS — Y93I9 Activity, other involving external motion: Secondary | ICD-10-CM | POA: Diagnosis not present

## 2014-07-06 DIAGNOSIS — E559 Vitamin D deficiency, unspecified: Secondary | ICD-10-CM | POA: Diagnosis not present

## 2014-07-06 DIAGNOSIS — F17200 Nicotine dependence, unspecified, uncomplicated: Secondary | ICD-10-CM | POA: Insufficient documentation

## 2014-07-06 DIAGNOSIS — S32050S Wedge compression fracture of fifth lumbar vertebra, sequela: Secondary | ICD-10-CM

## 2014-07-06 DIAGNOSIS — S3992XA Unspecified injury of lower back, initial encounter: Secondary | ICD-10-CM | POA: Insufficient documentation

## 2014-07-06 DIAGNOSIS — G9520 Unspecified cord compression: Secondary | ICD-10-CM | POA: Insufficient documentation

## 2014-07-06 LAB — CBC
HCT: 39.7 % (ref 39.0–52.0)
Hemoglobin: 12.7 g/dL — ABNORMAL LOW (ref 13.0–17.0)
MCH: 31.9 pg (ref 26.0–34.0)
MCHC: 32 g/dL (ref 30.0–36.0)
MCV: 99.7 fL (ref 78.0–100.0)
MPV: 10.6 fL (ref 8.6–12.4)
PLATELETS: 252 10*3/uL (ref 150–400)
RBC: 3.98 MIL/uL — AB (ref 4.22–5.81)
RDW: 13.4 % (ref 11.5–15.5)
WBC: 4 10*3/uL (ref 4.0–10.5)

## 2014-07-06 MED ORDER — ACETAMINOPHEN-CODEINE #3 300-30 MG PO TABS: 1.0000 | ORAL_TABLET | ORAL | Status: DC | PRN

## 2014-07-06 NOTE — Patient Instructions (Signed)
[name],  Thank you for coming in today. It was a pleasure meeting you. I look forward to being your primary doctor.  1. Back pain: Tylenol #3 for pain Vit D level check with plan for replacement if low Ordered physical therapy Order for rolling walker- go to [facility] [facility] or [place] supply. Show medicaid card.   F/u in 8 weeks  Dr. Adrian Blackwater

## 2014-07-06 NOTE — Assessment & Plan Note (Signed)
Cbc today
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
## 2014-07-07 LAB — VITAMIN D 25 HYDROXY (VIT D DEFICIENCY, FRACTURES): VIT D 25 HYDROXY: 25 ng/mL — AB (ref 30–100)

## 2014-07-09 DIAGNOSIS — E559 Vitamin D deficiency, unspecified: Secondary | ICD-10-CM | POA: Insufficient documentation

## 2014-07-09 MED ORDER — VITAMIN D3 50 MCG (2000 UT) PO TABS: 2000.0000 [IU] | ORAL_TABLET | Freq: Every day | ORAL | Status: DC

## 2014-07-09 NOTE — Assessment & Plan Note (Signed)
A: vit D insuff P: 2000 IU of vit D3 daily

## 2014-07-09 NOTE — Addendum Note (Signed)
Addended by: Boykin Nearing on: 07/09/2014 09:07 AM   Modules accepted: Orders

## 2014-07-20 ENCOUNTER — Telehealth: Payer: Self-pay | Admitting: *Deleted

## 2014-07-20 NOTE — Telephone Encounter (Signed)
[employer] Interpreted [place] [phone] Unable to contact pt voice mail full

## 2014-07-20 NOTE — Telephone Encounter (Signed)
-----   Message from Boykin Nearing, MD sent at 07/09/2014  9:04 AM EDT ----- Vit D insufficiency take 2000 IU of D3 daily Hgb improved to 13

## 2014-11-01 ENCOUNTER — Emergency Department (HOSPITAL_COMMUNITY)
Admission: EM | Admit: 2014-11-01 | Discharge: 2014-11-01 | Disposition: A | Discharge status: Home / Self Care | Payer: Medicaid Other | Source: Home / Self Care | Attending: Family Medicine | Admitting: Family Medicine

## 2014-11-01 ENCOUNTER — Encounter (HOSPITAL_COMMUNITY): Payer: Self-pay | Admitting: Emergency Medicine

## 2014-11-01 ENCOUNTER — Emergency Department (INDEPENDENT_AMBULATORY_CARE_PROVIDER_SITE_OTHER): Payer: Medicaid Other

## 2014-11-01 DIAGNOSIS — J302 Other seasonal allergic rhinitis: Secondary | ICD-10-CM | POA: Diagnosis not present

## 2014-11-01 MED ORDER — IPRATROPIUM BROMIDE 0.06 % NA SOLN
2.0000 | Freq: Four times a day (QID) | NASAL | Status: DC
Start: 2014-11-01 — End: 2016-12-24

## 2014-11-01 MED ORDER — CETIRIZINE HCL 10 MG PO TABS: 10.0000 mg | ORAL_TABLET | Freq: Every day | ORAL | Status: DC

## 2014-11-01 NOTE — ED Notes (Signed)
Pt has a chronic cough that he was being seen for in the past.  He is here today because he cant sleep and has a lot of discomfort from the coughing.  He states he coughs up a lot of mucus.  Interpreter phone was used to collect information.  Interpreter: Vaughan Basta  [phone]  Dr. Juventino Slovak present in the room at the same time to get all our information and treat the patient.
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
## 2014-11-30 ENCOUNTER — Ambulatory Visit (INDEPENDENT_AMBULATORY_CARE_PROVIDER_SITE_OTHER): Payer: Medicaid Other | Admitting: Neurology

## 2014-11-30 ENCOUNTER — Encounter: Payer: Self-pay | Admitting: Neurology

## 2014-11-30 VITALS — BP 110/84 | HR 67 | Resp 16 | Ht 61.75 in | Wt 122.0 lb

## 2014-11-30 DIAGNOSIS — M5441 Lumbago with sciatica, right side: Secondary | ICD-10-CM | POA: Insufficient documentation

## 2014-11-30 DIAGNOSIS — G629 Polyneuropathy, unspecified: Secondary | ICD-10-CM | POA: Diagnosis not present

## 2014-11-30 DIAGNOSIS — R413 Other amnesia: Secondary | ICD-10-CM | POA: Diagnosis not present
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
## 2014-12-03 ENCOUNTER — Telehealth: Payer: Self-pay | Admitting: Neurology

## 2014-12-03 MED ORDER — GABAPENTIN 300 MG PO CAPS: 300.0000 mg | ORAL_CAPSULE | Freq: Every day | ORAL | Status: DC

## 2014-12-03 NOTE — Telephone Encounter (Signed)
Rx sent to patient's pharmacy. Left msg on [name]'s vm that Rx was sent to patient's pharmacy.

## 2014-12-03 NOTE — Telephone Encounter (Signed)
Pt called in regards to a prescription of gabapentin that was supposed to be called in on Friday and the pharmacy said it wasn't/Dawn CB# [phone]

## 2014-12-20 ENCOUNTER — Ambulatory Visit: Payer: Medicaid Other | Admitting: Neurology

## 2015-02-01 ENCOUNTER — Telehealth: Payer: Self-pay | Admitting: Neurology

## 2015-02-01 NOTE — Telephone Encounter (Signed)
Lmovm to rtn my call. 

## 2015-02-01 NOTE — Telephone Encounter (Signed)
Pt caretaker eddy called [phone] needs to talk to someone about a Education officer, museum

## 2015-02-05 NOTE — Telephone Encounter (Signed)
Lmovm to rtn my call. 

## 2015-03-22 ENCOUNTER — Encounter: Payer: Self-pay | Admitting: Neurology

## 2015-03-22 ENCOUNTER — Ambulatory Visit (INDEPENDENT_AMBULATORY_CARE_PROVIDER_SITE_OTHER): Payer: Medicaid Other | Admitting: Neurology

## 2015-03-22 VITALS — BP 104/74 | HR 102 | Ht 61.75 in | Wt 122.0 lb

## 2015-03-22 DIAGNOSIS — M5442 Lumbago with sciatica, left side: Secondary | ICD-10-CM

## 2015-03-22 DIAGNOSIS — M545 Low back pain, unspecified: Secondary | ICD-10-CM | POA: Insufficient documentation

## 2015-03-22 DIAGNOSIS — F32A Depression, unspecified: Secondary | ICD-10-CM

## 2015-03-22 DIAGNOSIS — F0390 Unspecified dementia without behavioral disturbance: Secondary | ICD-10-CM

## 2015-03-22 DIAGNOSIS — F03B0 Unspecified dementia, moderate, without behavioral disturbance, psychotic disturbance, mood disturbance, and anxiety: Secondary | ICD-10-CM

## 2015-03-22 DIAGNOSIS — F329 Major depressive disorder, single episode, unspecified: Secondary | ICD-10-CM | POA: Diagnosis not present

## 2015-03-22 DIAGNOSIS — M5441 Lumbago with sciatica, right side: Secondary | ICD-10-CM

## 2015-03-22 MED ORDER — GABAPENTIN 300 MG PO CAPS: 300.0000 mg | ORAL_CAPSULE | Freq: Every day | ORAL | Status: DC

## 2015-03-22 MED ORDER — VENLAFAXINE HCL ER 37.5 MG PO CP24: ORAL_CAPSULE | ORAL | Status: DC

## 2015-03-22 NOTE — Patient Instructions (Signed)
1. Restart Gabapentin and Effexor 2. Please contact your Primary Doctor, Dr. Adrian Blackwater at [facility] 3. Follow-up in 8 months
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
## 2015-03-29 ENCOUNTER — Telehealth: Payer: Self-pay | Admitting: Family Medicine
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
## 2015-04-01 ENCOUNTER — Encounter: Payer: Self-pay | Admitting: Clinical

## 2015-04-01 NOTE — Progress Notes (Signed)
Call made to Adult Protective Services at [phone]; HIPPA-compliant message left to return call to Roselyn Reef from [facility] at [phone] concerning a patient.

## 2015-04-03 ENCOUNTER — Telehealth: Payer: Self-pay | Admitting: Clinical

## 2015-04-03 NOTE — Telephone Encounter (Signed)
Attempt to contact [name] to inform him of referral to [facility]. [place] phone interpreter([id number]) says that the first number attempted, [phone], is an invalid number. Interpreter left HIPPA-compliant message on mobile number, saying to return call to [place] from [facility] at [phone].

## 2015-04-08 ENCOUNTER — Encounter: Payer: Self-pay | Admitting: Clinical
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
## 2015-04-08 NOTE — Progress Notes (Signed)
Patient ID: [name], male   DOB: 1945-10-04, 70 y.o.   MRN: [id number] Noted. Will route to scheduler to schedule f/u appt

## 2015-04-11 NOTE — Telephone Encounter (Signed)
I called pt. To schedule a f/u appointment with PCP and was unable to reach pt. Pt. Friend returned call and was scheduled an appointment on 04/19/15.

## 2015-04-19 ENCOUNTER — Ambulatory Visit: Payer: Medicaid Other | Admitting: Family Medicine

## 2015-04-21 ENCOUNTER — Telehealth: Payer: Self-pay | Admitting: Family Medicine
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
## 2015-04-22 ENCOUNTER — Encounter: Payer: Self-pay | Admitting: Clinical
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
## 2015-05-31 ENCOUNTER — Encounter: Payer: Self-pay | Admitting: Internal Medicine

## 2015-07-23 ENCOUNTER — Telehealth: Payer: Self-pay | Admitting: Internal Medicine

## 2015-07-23 NOTE — Telephone Encounter (Signed)
Pt having bad abdominal pain, requesting pt be seen asap. Pt scheduled to see Nicoletta Ba PA tomorrow at 2pm. Pt aware of appt.

## 2015-07-25 ENCOUNTER — Ambulatory Visit: Payer: Medicaid Other | Admitting: Physician Assistant

## 2015-07-29 ENCOUNTER — Telehealth: Payer: Self-pay | Admitting: Internal Medicine

## 2015-07-31 NOTE — Telephone Encounter (Signed)
Pt having abdominal pain, daughter requesting appt. Pt scheduled to see Dr. Henrene Pastor 09/09/15@10 :45am. Message left for daughter regarding appt.

## 2015-07-31 NOTE — Telephone Encounter (Signed)
Pt's daughter is calling back.

## 2015-08-15 ENCOUNTER — Encounter: Payer: Self-pay | Admitting: Family Medicine

## 2015-09-09 ENCOUNTER — Ambulatory Visit: Payer: Medicaid Other | Admitting: Internal Medicine

## 2015-09-13 ENCOUNTER — Ambulatory Visit: Payer: Medicaid Other | Admitting: Internal Medicine

## 2015-09-25 ENCOUNTER — Telehealth: Payer: Self-pay | Admitting: Internal Medicine

## 2015-09-25 NOTE — Telephone Encounter (Signed)
Left message to call back. Pt scheduled to see Amy Esterwood PA 10/01/15@2 :30pm.

## 2015-09-26 ENCOUNTER — Encounter: Payer: Self-pay | Admitting: *Deleted

## 2015-09-27 NOTE — Telephone Encounter (Signed)
Mailbox is full and cannot accept messages. Appt letter mailed.

## 2015-10-01 ENCOUNTER — Ambulatory Visit (INDEPENDENT_AMBULATORY_CARE_PROVIDER_SITE_OTHER): Payer: Medicaid Other | Admitting: Gastroenterology

## 2015-10-01 ENCOUNTER — Encounter: Payer: Self-pay | Admitting: Gastroenterology

## 2015-10-01 VITALS — BP 108/78 | Resp 76 | Ht 59.0 in | Wt 120.2 lb

## 2015-10-01 DIAGNOSIS — R1013 Epigastric pain: Secondary | ICD-10-CM

## 2015-10-01 DIAGNOSIS — Z86010 Personal history of colonic polyps: Secondary | ICD-10-CM

## 2015-10-01 DIAGNOSIS — R194 Change in bowel habit: Secondary | ICD-10-CM | POA: Diagnosis not present

## 2015-10-01 DIAGNOSIS — K30 Functional dyspepsia: Secondary | ICD-10-CM

## 2015-10-01 MED ORDER — NA SULFATE-K SULFATE-MG SULF 17.5-3.13-1.6 GM/177ML PO SOLN: 1.0000 | Freq: Once | ORAL | Status: DC

## 2015-10-01 NOTE — Patient Instructions (Signed)

## 2015-10-01 NOTE — Progress Notes (Signed)
Reviewed. Reliability of history in question. Agree with plans
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
## 2015-11-04 ENCOUNTER — Other Ambulatory Visit: Payer: Self-pay | Admitting: Gastroenterology

## 2015-11-04 DIAGNOSIS — Z86010 Personal history of colonic polyps: Secondary | ICD-10-CM

## 2015-11-04 DIAGNOSIS — R194 Change in bowel habit: Secondary | ICD-10-CM

## 2015-11-04 DIAGNOSIS — R1013 Epigastric pain: Secondary | ICD-10-CM

## 2015-11-04 DIAGNOSIS — K30 Functional dyspepsia: Secondary | ICD-10-CM

## 2015-11-05 ENCOUNTER — Encounter: Payer: Self-pay | Admitting: Internal Medicine

## 2015-11-05 ENCOUNTER — Ambulatory Visit (AMBULATORY_SURGERY_CENTER): Payer: Medicaid Other | Admitting: Internal Medicine

## 2015-11-05 VITALS — BP 108/73 | HR 67 | Temp 99.6°F | Resp 22 | Ht 59.0 in | Wt 120.0 lb

## 2015-11-05 DIAGNOSIS — K2100 Gastro-esophageal reflux disease with esophagitis, without bleeding: Secondary | ICD-10-CM

## 2015-11-05 DIAGNOSIS — D122 Benign neoplasm of ascending colon: Secondary | ICD-10-CM

## 2015-11-05 DIAGNOSIS — D120 Benign neoplasm of cecum: Secondary | ICD-10-CM | POA: Diagnosis not present

## 2015-11-05 DIAGNOSIS — R1013 Epigastric pain: Secondary | ICD-10-CM | POA: Diagnosis not present

## 2015-11-05 DIAGNOSIS — D125 Benign neoplasm of sigmoid colon: Secondary | ICD-10-CM

## 2015-11-05 DIAGNOSIS — D124 Benign neoplasm of descending colon: Secondary | ICD-10-CM

## 2015-11-05 DIAGNOSIS — Z860100 Personal history of colon polyps, unspecified: Secondary | ICD-10-CM

## 2015-11-05 MED ORDER — SODIUM CHLORIDE 0.9 % IV SOLN: 500.0000 mL | INTRAVENOUS | Status: DC

## 2015-11-05 MED ORDER — OMEPRAZOLE 40 MG PO CPDR: DELAYED_RELEASE_CAPSULE | ORAL | 11 refills | Status: DC

## 2015-11-05 NOTE — Patient Instructions (Signed)
YOU HAD AN ENDOSCOPIC PROCEDURE TODAY AT [facility]:   Refer to the procedure report that was given to you for any specific questions about what was found during the examination.  If the procedure report does not answer your questions, please call your gastroenterologist to clarify.  If you requested that your care partner not be given the details of your procedure findings, then the procedure report has been included in a sealed envelope for you to review at your convenience later.  YOU SHOULD EXPECT: Some feelings of bloating in the abdomen. Passage of more gas than usual.  Walking can help get rid of the air that was put into your GI tract during the procedure and reduce the bloating. If you had a lower endoscopy (such as a colonoscopy or flexible sigmoidoscopy) you may notice spotting of blood in your stool or on the toilet paper. If you underwent a bowel prep for your procedure, you may not have a normal bowel movement for a few days.  Please Note:  You might notice some irritation and congestion in your nose or some drainage.  This is from the oxygen used during your procedure.  There is no need for concern and it should clear up in a day or so.  SYMPTOMS TO REPORT IMMEDIATELY:   Following lower endoscopy (colonoscopy or flexible sigmoidoscopy):  Excessive amounts of blood in the stool  Significant tenderness or worsening of abdominal pains  Swelling of the abdomen that is new, acute  Fever of 100F or higher   Following upper endoscopy (EGD)  Vomiting of blood or coffee ground material  New chest pain or pain under the shoulder blades  Painful or persistently difficult swallowing  New shortness of breath  Fever of 100F or higher  Black, tarry-looking stools  For urgent or emergent issues, a gastroenterologist can be reached at any hour by calling [phone].   DIET:  We do recommend a small meal at first, but then you may proceed to your regular diet.  Drink  plenty of fluids but you should avoid alcoholic beverages for 24 hours.  ACTIVITY:  You should plan to take it easy for the rest of today and you should NOT DRIVE or use heavy machinery until tomorrow (because of the sedation medicines used during the test).    FOLLOW UP: Our staff will call the number listed on your records the next business day following your procedure to check on you and address any questions or concerns that you may have regarding the information given to you following your procedure. If we do not reach you, we will leave a message.  However, if you are feeling well and you are not experiencing any problems, there is no need to return our call.  We will assume that you have returned to your regular daily activities without incident.  If any biopsies were taken you will be contacted by phone or by letter within the next 1-3 weeks.  Please call us at [phone] if you have not heard about the biopsies in 3 weeks.    SIGNATURES/CONFIDENTIALITY: You and/or your care partner have signed paperwork which will be entered into your electronic medical record.  These signatures attest to the fact that that the information above on your After Visit Summary has been reviewed and is understood.  Full responsibility of the confidentiality of this discharge information lies with you and/or your care-partner.  Your [facility]  Please read  over handouts about gastritis, polyps, diverticulosis, and high fiber diets  Please continue your normal medications

## 2015-11-05 NOTE — Op Note (Signed)
[facility] Patient Name: [name] Procedure Date: 11/05/2015 2:48 PM MRN: [id number] Endoscopist: Docia Chuck. Henrene Pastor , MD Age: 70 Referring MD:  Date of Birth: May 21, 1945 Gender: Male Account #: [id number] Procedure:                Colonoscopy, with cold snare polypectomy X5 Indications:              Surveillance: Personal history of adenomatous                            polyps on last colonoscopy 5 years ago. Index exam                            April 2012 Medicines:                Monitored Anesthesia Care Procedure:                Pre-Anesthesia Assessment:                           - Prior to the procedure, a History and Physical                            was performed, and patient medications and                            allergies were reviewed. The patient's tolerance of                            previous anesthesia was also reviewed. The risks                            and benefits of the procedure and the sedation                            options and risks were discussed with the patient.                            All questions were answered, and informed consent                            was obtained. Prior Anticoagulants: The patient has                            taken no previous anticoagulant or antiplatelet                            agents. ASA Grade Assessment: II - A patient with                            mild systemic disease. After reviewing the risks                            and benefits, the patient was deemed in  satisfactory condition to undergo the procedure.                           After obtaining informed consent, the colonoscope                            was passed under direct vision. Throughout the                            procedure, the patient's blood pressure, pulse, and                            oxygen saturations were monitored continuously. The                            Model CF-[id number]  scope was introduced                            through the anus and advanced to the the cecum,                            identified by appendiceal orifice and ileocecal                            valve. The ileocecal valve, appendiceal orifice,                            and rectum were photographed. The quality of the                            bowel preparation was excellent. The colonoscopy                            was performed without difficulty. The patient                            tolerated the procedure well. The bowel preparation                            used was SUPREP. Scope In: 3:02:44 PM Scope Out: 3:16:20 PM Scope Withdrawal Time: 0 hours 11 minutes 57 seconds  Total Procedure Duration: 0 hours 13 minutes 36 seconds  Findings:                 Five polyps were found in the sigmoid colon,                            descending colon, ascending colon and cecum. The                            polyps were 3 to 5 mm in size. These polyps were                            removed with a cold snare. Resection and retrieval  were complete.                           Multiple small and large-mouthed diverticula were                            found in the cecum and right colon.                           The entire examined colon appeared normal on direct                            views. Rectal vault too narrow for safe                            retroflexion, though adequately evaluated antegrade                            and imaged. Mild radiation proctitis present. Complications:            No immediate complications. Estimated blood loss:                            None. Estimated Blood Loss:     Estimated blood loss: none. Impression:               - Five 3 to 5 mm polyps in the sigmoid colon, in                            the descending colon, in the ascending colon and in                            the cecum, removed with a cold snare. Resected  and                            retrieved.                           - Diverticulosis in the cecum and in the right                            colon.                           - The entire examined colon is normal otherwise                            except for mild radiation proctitis. Recommendation:           - Repeat colonoscopy in 3 - 5 years for                            surveillance.                           - EGD today please see report.                           -  Continue present medications.                           - Await pathology results. Docia Chuck. Henrene Pastor, MD 11/05/2015 3:27:41 PM This report has been signed electronically.

## 2015-11-05 NOTE — Op Note (Signed)
[facility] Patient Name: [name] Procedure Date: 11/05/2015 3:17 PM MRN: [id number] Endoscopist: Docia Chuck. Henrene Pastor , MD Age: 70 Referring MD:  Date of Birth: 1945/07/14 Gender: Male Account #: [id number] Procedure:                Upper GI endoscopy, with biopsies Indications:              Dyspepsia Medicines:                Monitored Anesthesia Care Procedure:                Pre-Anesthesia Assessment:                           - Prior to the procedure, a History and Physical                            was performed, and patient medications and                            allergies were reviewed. The patient's tolerance of                            previous anesthesia was also reviewed. The risks                            and benefits of the procedure and the sedation                            options and risks were discussed with the patient.                            All questions were answered, and informed consent                            was obtained. Prior Anticoagulants: The patient has                            taken no previous anticoagulant or antiplatelet                            agents. ASA Grade Assessment: II - A patient with                            mild systemic disease. After reviewing the risks                            and benefits, the patient was deemed in                            satisfactory condition to undergo the procedure.                           - Prior to the procedure, a History and Physical  was performed, and patient medications and                            allergies were reviewed. The patient's tolerance of                            previous anesthesia was also reviewed. The risks                            and benefits of the procedure and the sedation                            options and risks were discussed with the patient.                            All questions were answered, and informed  consent                            was obtained. Prior Anticoagulants: The patient has                            taken no previous anticoagulant or antiplatelet                            agents. ASA Grade Assessment: II - A patient with                            mild systemic disease. After reviewing the risks                            and benefits, the patient was deemed in                            satisfactory condition to undergo the procedure.                           After obtaining informed consent, the endoscope was                            passed under direct vision. Throughout the                            procedure, the patient's blood pressure, pulse, and                            oxygen saturations were monitored continuously. The                            Model GIF-[id number] scope was introduced                            through the mouth, and advanced to the second part  of duodenum. The upper GI endoscopy was                            accomplished without difficulty. The patient                            tolerated the procedure well. Scope In: Scope Out: Findings:                 Non-severe esophagitis was found.                           The exam of the esophagus was otherwise normal.                           Mild inflammation characterized by erosions was                            found in the gastric antrum. Biopsies were taken                            with a cold forceps for Helicobacter pylori testing                            using CLOtest.                           The exam of the stomach was otherwise normal.                            Retroflexed view revealed a small hiatal hernia.                           The examined duodenum was normal. Complications:            No immediate complications. Estimated Blood Loss:     Estimated blood loss: none. Impression:               - Non-severe reflux esophagitis.                            - Gastritis. Biopsied.                           - Normal examined duodenum. Recommendation:           - Prescribe omeprazole 40 mg daily; #30; 11 refills.                           - Resume previous diet.                           - Treat for Helicobacter pylori if positive CLO.                           - Return to the care of your primary provider. Docia Chuck. Henrene Pastor, MD 11/05/2015 3:33:19 PM This report has been signed electronically.

## 2015-11-05 NOTE — Progress Notes (Signed)
Called to room to assist during endoscopic procedure.  Patient ID and intended procedure confirmed with present staff. Received instructions for my participation in the procedure from the performing physician.  

## 2015-11-05 NOTE — Progress Notes (Signed)
To recovery. Report to Harden Mo, Therapist, sports, VSS

## 2015-11-06 ENCOUNTER — Telehealth: Payer: Self-pay

## 2015-11-06 LAB — HELICOBACTER PYLORI SCREEN-BIOPSY: UREASE: NEGATIVE

## 2015-11-06 NOTE — Telephone Encounter (Signed)
  Follow up Call-  Call back number 11/05/2015  Post procedure Call Back phone  # [phone]  Permission to leave phone message Yes  Some recent data might be hidden    Patient was called for follow up after his procedure on 11/05/2015. No answer at the number given for follow up phone call. A message was left on the answering machine.

## 2015-11-11 ENCOUNTER — Encounter: Payer: Self-pay | Admitting: Internal Medicine

## 2015-11-20 ENCOUNTER — Ambulatory Visit: Payer: Medicaid Other | Admitting: Neurology

## 2015-11-25 ENCOUNTER — Ambulatory Visit: Payer: Medicaid Other | Admitting: Internal Medicine

## 2016-01-14 ENCOUNTER — Ambulatory Visit: Payer: Medicaid Other | Admitting: Neurology

## 2016-01-25 ENCOUNTER — Encounter (HOSPITAL_COMMUNITY): Payer: Self-pay

## 2016-01-25 ENCOUNTER — Emergency Department (HOSPITAL_COMMUNITY): Payer: Medicaid Other

## 2016-01-25 ENCOUNTER — Emergency Department (HOSPITAL_COMMUNITY)
Admission: EM | Admit: 2016-01-25 | Discharge: 2016-01-25 | Disposition: A | Discharge status: Home / Self Care | Payer: Medicaid Other | Attending: Emergency Medicine | Admitting: Emergency Medicine

## 2016-01-25 DIAGNOSIS — R05 Cough: Secondary | ICD-10-CM | POA: Diagnosis present

## 2016-01-25 DIAGNOSIS — Z8546 Personal history of malignant neoplasm of prostate: Secondary | ICD-10-CM | POA: Insufficient documentation

## 2016-01-25 DIAGNOSIS — J189 Pneumonia, unspecified organism: Secondary | ICD-10-CM | POA: Insufficient documentation

## 2016-01-25 DIAGNOSIS — Z87891 Personal history of nicotine dependence: Secondary | ICD-10-CM | POA: Diagnosis not present

## 2016-01-25 LAB — BASIC METABOLIC PANEL
Anion gap: 7 (ref 5–15)
BUN: 12 mg/dL (ref 6–20)
CALCIUM: 8.6 mg/dL — AB (ref 8.9–10.3)
CHLORIDE: 109 mmol/L (ref 101–111)
CO2: 23 mmol/L (ref 22–32)
CREATININE: 1.13 mg/dL (ref 0.61–1.24)
Glucose, Bld: 87 mg/dL (ref 65–99)
Potassium: 4 mmol/L (ref 3.5–5.1)
SODIUM: 139 mmol/L (ref 135–145)

## 2016-01-25 LAB — I-STAT CG4 LACTIC ACID, ED: LACTIC ACID, VENOUS: 1.03 mmol/L (ref 0.5–1.9)

## 2016-01-25 LAB — CBC WITH DIFFERENTIAL/PLATELET
BASOS ABS: 0.1 10*3/uL (ref 0.0–0.1)
BASOS PCT: 2 %
EOS ABS: 0.2 10*3/uL (ref 0.0–0.7)
EOS PCT: 3 %
HCT: 34.9 % — ABNORMAL LOW (ref 39.0–52.0)
HEMOGLOBIN: 11.9 g/dL — AB (ref 13.0–17.0)
LYMPHS ABS: 1.9 10*3/uL (ref 0.7–4.0)
Lymphocytes Relative: 36 %
MCH: 32.6 pg (ref 26.0–34.0)
MCHC: 34.1 g/dL (ref 30.0–36.0)
MCV: 95.6 fL (ref 78.0–100.0)
Monocytes Absolute: 0.4 10*3/uL (ref 0.1–1.0)
Monocytes Relative: 7 %
NEUTROS PCT: 52 %
Neutro Abs: 2.8 10*3/uL (ref 1.7–7.7)
PLATELETS: 301 10*3/uL (ref 150–400)
RBC: 3.65 MIL/uL — AB (ref 4.22–5.81)
RDW: 12.1 % (ref 11.5–15.5)
WBC: 5.3 10*3/uL (ref 4.0–10.5)

## 2016-01-25 MED ORDER — BENZONATATE 100 MG PO CAPS: 100.0000 mg | ORAL_CAPSULE | Freq: Three times a day (TID) | ORAL | 0 refills | Status: DC | PRN

## 2016-01-25 MED ORDER — AZITHROMYCIN 250 MG PO TABS: 250.0000 mg | ORAL_TABLET | Freq: Every day | ORAL | 0 refills | Status: DC

## 2016-01-25 MED ORDER — AMOXICILLIN 500 MG PO CAPS: 500.0000 mg | ORAL_CAPSULE | Freq: Three times a day (TID) | ORAL | 0 refills | Status: DC

## 2016-01-25 MED ORDER — IPRATROPIUM-ALBUTEROL 0.5-2.5 (3) MG/3ML IN SOLN
3.0000 mL | Freq: Once | RESPIRATORY_TRACT | Status: AC
  Administered 2016-01-25: 3 mL via RESPIRATORY_TRACT
  Filled 2016-01-25: qty 3

## 2016-01-25 MED ORDER — SODIUM CHLORIDE 0.9 % IV BOLUS (SEPSIS)
1000.0000 mL | Freq: Once | INTRAVENOUS | Status: AC
  Administered 2016-01-25: 1000 mL via INTRAVENOUS

## 2016-01-25 MED ORDER — DEXTROSE 5 % IV SOLN
500.0000 mg | Freq: Once | INTRAVENOUS | Status: AC
  Administered 2016-01-25: 500 mg via INTRAVENOUS
  Filled 2016-01-25: qty 500

## 2016-01-25 MED ORDER — ALBUTEROL SULFATE HFA 108 (90 BASE) MCG/ACT IN AERS
2.0000 | INHALATION_SPRAY | Freq: Once | RESPIRATORY_TRACT | Status: DC
  Filled 2016-01-25: qty 6.7

## 2016-01-25 MED ORDER — BENZONATATE 100 MG PO CAPS
100.0000 mg | ORAL_CAPSULE | Freq: Once | ORAL | Status: AC
  Administered 2016-01-25: 100 mg via ORAL
  Filled 2016-01-25: qty 1

## 2016-01-25 MED ORDER — CEFTRIAXONE SODIUM 1 G IJ SOLR
1.0000 g | Freq: Once | INTRAMUSCULAR | Status: AC
  Administered 2016-01-25: 1 g via INTRAVENOUS
  Filled 2016-01-25: qty 10

## 2016-01-25 NOTE — ED Provider Notes (Signed)
9:32 PM Patient reassessed with daughter in the room. Daughter translating during encounter as [employer] interpreter was unable to understand the patient when speaking. Patient with clear lung sounds. He states that he is feeling better. He is afebrile, without leukocytosis. He was not tachycardic until administration of albuterol. HR 108 while I was at the patient's bedside. Last albuterol neb received at [facility]. Patient has received IV abx in the ED. Will discharge with albuterol inhaler and tessalon for PRN use. Patient given Rx for amoxicillin and Azithromycin. Patient stable for discharge and outpatient primary care follow-up. Return precautions discussed and provided. Patient discharged in stable condition with no unaddressed concerns.   Antonietta Breach, PA-C 01/25/16 2138

## 2016-01-25 NOTE — Discharge Instructions (Signed)
Read the information below.  Use the prescribed medication as directed.  Please discuss all new medications with your pharmacist.  You may return to the Emergency Department at any time for worsening condition or any new symptoms that concern you.  If you develop worsening chest pain, shortness of breath, fever, you pass out, or become weak or dizzy, return to the ER for a recheck.     ??c thng tin d??i ?y. S? d?ng thu?c k toa theo ch? d?n. Hy th?o lu?n t?t c? cc lo?i thu?c m?i v?i d??c s? c?a b?n. Qu v? c th? tr? l?i S? C?p C?u b?t c? lc no v tnh tr?ng t?i t? h?n ho?c b?t k? tri?u ch?ng m?i no lin quan ??n qu v?. N?u b?n b? ?au ng?c t?i t? h?n, th? d?c, s?t, b?n ?i ra ngoi, ho?c tr? nn y?u ?u?i ho?c chng m?t, hy tr? l?i ER ?? ki?m tra l?i.

## 2016-01-25 NOTE — ED Triage Notes (Signed)
Patient here with productive cough x 10 days, cough worse at night. Using otc meds with no relief. Reports some sore throat with same, NAD
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
## 2016-01-30 LAB — CULTURE, BLOOD (ROUTINE X 2)
Culture: NO GROWTH
Culture: NO GROWTH

## 2016-03-25 ENCOUNTER — Ambulatory Visit: Payer: Medicaid Other | Admitting: Neurology

## 2016-04-09 ENCOUNTER — Encounter: Payer: Self-pay | Admitting: Neurology

## 2016-07-29 ENCOUNTER — Encounter: Payer: Self-pay | Admitting: Family Medicine

## 2016-12-24 ENCOUNTER — Emergency Department (HOSPITAL_COMMUNITY): Payer: Medicare Other

## 2016-12-24 ENCOUNTER — Encounter (HOSPITAL_COMMUNITY): Payer: Self-pay

## 2016-12-24 ENCOUNTER — Emergency Department (HOSPITAL_COMMUNITY)
Admission: EM | Admit: 2016-12-24 | Discharge: 2016-12-25 | Disposition: A | Discharge status: Home / Self Care | Payer: Medicare Other | Attending: Emergency Medicine | Admitting: Emergency Medicine

## 2016-12-24 DIAGNOSIS — Z87891 Personal history of nicotine dependence: Secondary | ICD-10-CM | POA: Insufficient documentation

## 2016-12-24 DIAGNOSIS — J181 Lobar pneumonia, unspecified organism: Secondary | ICD-10-CM | POA: Insufficient documentation

## 2016-12-24 DIAGNOSIS — R51 Headache: Secondary | ICD-10-CM | POA: Diagnosis present

## 2016-12-24 DIAGNOSIS — J189 Pneumonia, unspecified organism: Secondary | ICD-10-CM

## 2016-12-24 DIAGNOSIS — G44209 Tension-type headache, unspecified, not intractable: Secondary | ICD-10-CM | POA: Diagnosis not present

## 2016-12-24 DIAGNOSIS — F0390 Unspecified dementia without behavioral disturbance: Secondary | ICD-10-CM | POA: Insufficient documentation

## 2016-12-24 LAB — COMPREHENSIVE METABOLIC PANEL
ALK PHOS: 67 U/L (ref 38–126)
ALT: 28 U/L (ref 17–63)
ANION GAP: 6 (ref 5–15)
AST: 26 U/L (ref 15–41)
Albumin: 3.6 g/dL (ref 3.5–5.0)
BUN: 14 mg/dL (ref 6–20)
CALCIUM: 8.7 mg/dL — AB (ref 8.9–10.3)
CO2: 23 mmol/L (ref 22–32)
CREATININE: 1.26 mg/dL — AB (ref 0.61–1.24)
Chloride: 109 mmol/L (ref 101–111)
GFR calc non Af Amer: 57 mL/min — ABNORMAL LOW (ref >60)
Glucose, Bld: 94 mg/dL (ref 65–99)
Potassium: 4.2 mmol/L (ref 3.5–5.1)
Sodium: 138 mmol/L (ref 135–145)
TOTAL PROTEIN: 6.7 g/dL (ref 6.5–8.1)
Total Bilirubin: 0.5 mg/dL (ref 0.3–1.2)

## 2016-12-24 LAB — CBC WITH DIFFERENTIAL/PLATELET
BASOS ABS: 0 10*3/uL (ref 0.0–0.1)
Basophils Relative: 1 %
Eosinophils Absolute: 0.2 10*3/uL (ref 0.0–0.7)
Eosinophils Relative: 4 %
HEMATOCRIT: 36.2 % — AB (ref 39.0–52.0)
Hemoglobin: 12 g/dL — ABNORMAL LOW (ref 13.0–17.0)
LYMPHS ABS: 1.7 10*3/uL (ref 0.7–4.0)
LYMPHS PCT: 40 %
MCH: 32.5 pg (ref 26.0–34.0)
MCHC: 33.1 g/dL (ref 30.0–36.0)
MCV: 98.1 fL (ref 78.0–100.0)
MONO ABS: 0.4 10*3/uL (ref 0.1–1.0)
MONOS PCT: 9 %
NEUTROS ABS: 2 10*3/uL (ref 1.7–7.7)
Neutrophils Relative %: 46 %
Platelets: 218 10*3/uL (ref 150–400)
RBC: 3.69 MIL/uL — ABNORMAL LOW (ref 4.22–5.81)
RDW: 12 % (ref 11.5–15.5)
WBC: 4.3 10*3/uL (ref 4.0–10.5)

## 2016-12-24 LAB — I-STAT TROPONIN, ED: Troponin i, poc: 0 ng/mL (ref 0.00–0.08)

## 2016-12-24 LAB — I-STAT CHEM 8, ED
BUN: 17 mg/dL (ref 6–20)
CALCIUM ION: 1.14 mmol/L — AB (ref 1.15–1.40)
CREATININE: 1.2 mg/dL (ref 0.61–1.24)
Chloride: 106 mmol/L (ref 101–111)
Glucose, Bld: 90 mg/dL (ref 65–99)
HEMATOCRIT: 37 % — AB (ref 39.0–52.0)
HEMOGLOBIN: 12.6 g/dL — AB (ref 13.0–17.0)
Potassium: 4.2 mmol/L (ref 3.5–5.1)
Sodium: 142 mmol/L (ref 135–145)
TCO2: 24 mmol/L (ref 22–32)

## 2016-12-24 LAB — I-STAT CG4 LACTIC ACID, ED: Lactic Acid, Venous: 0.85 mmol/L (ref 0.5–1.9)

## 2016-12-24 MED ORDER — LEVOFLOXACIN IN D5W 750 MG/150ML IV SOLN
750.0000 mg | Freq: Once | INTRAVENOUS | Status: AC
  Administered 2016-12-25: 750 mg via INTRAVENOUS
  Filled 2016-12-24: qty 150

## 2016-12-24 MED ORDER — DIAZEPAM 5 MG/ML IJ SOLN: 2.5000 mg | Freq: Once | INTRAMUSCULAR | Status: DC

## 2016-12-24 MED ORDER — DOXYCYCLINE HYCLATE 100 MG PO CAPS: 100.0000 mg | ORAL_CAPSULE | Freq: Two times a day (BID) | ORAL | 0 refills | Status: DC

## 2016-12-24 MED ORDER — METOCLOPRAMIDE HCL 10 MG PO TABS: 10.0000 mg | ORAL_TABLET | Freq: Four times a day (QID) | ORAL | 0 refills | Status: DC | PRN

## 2016-12-24 MED ORDER — SODIUM CHLORIDE 0.9 % IV BOLUS (SEPSIS)
1000.0000 mL | Freq: Once | INTRAVENOUS | Status: AC
Start: 2016-12-24 — End: 2016-12-24
  Administered 2016-12-24: 1000 mL via INTRAVENOUS

## 2016-12-24 MED ORDER — IOPAMIDOL (ISOVUE-370) INJECTION 76%
INTRAVENOUS | Status: AC
  Administered 2016-12-24: 100 mL
  Filled 2016-12-24: qty 100

## 2016-12-24 MED ORDER — DIPHENHYDRAMINE HCL 50 MG/ML IJ SOLN
12.5000 mg | Freq: Once | INTRAMUSCULAR | Status: AC
  Administered 2016-12-24: 12.5 mg via INTRAVENOUS
  Filled 2016-12-24: qty 1

## 2016-12-24 MED ORDER — MORPHINE SULFATE (PF) 4 MG/ML IV SOLN
4.0000 mg | Freq: Once | INTRAVENOUS | Status: AC
  Administered 2016-12-24: 4 mg via INTRAVENOUS
  Filled 2016-12-24: qty 1

## 2016-12-24 MED ORDER — METOCLOPRAMIDE HCL 5 MG/ML IJ SOLN
10.0000 mg | Freq: Once | INTRAMUSCULAR | Status: AC
  Administered 2016-12-24: 10 mg via INTRAVENOUS
  Filled 2016-12-24: qty 2
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
## 2016-12-24 NOTE — ED Notes (Signed)
Patient transported to CT scan ( Angio chest).

## 2016-12-24 NOTE — ED Triage Notes (Signed)
Pt here for headache X2 weeks that has gotten progressively worse. Pt alert and oriented. Pt also reports back pain that makes it painful to take a deep breath. Pt has hx of subdural hematoma. Left pupil noted to be slightly larger than the right. BP 108/90 in triage. No unilateral weakness noted.

## 2016-12-25 DIAGNOSIS — J181 Lobar pneumonia, unspecified organism: Secondary | ICD-10-CM | POA: Diagnosis not present

## 2016-12-25 NOTE — ED Notes (Addendum)
Patient left the ER , multiple staff / security personnel/[employer] officer tried to look for pt. but unable to locate him at the [place] lot/ bus station . Camera operator and [facility] notified . [employer] will be notified by charge nurse for assistance. Nurse made multiple attempts to notify his daughter but unable to reach her . Dr. Zenia Resides notified on incident .

## 2016-12-25 NOTE — ED Provider Notes (Signed)
Patient signed out to me by Dr. Darl Householder awaiting chest CT results which confirmed his pneumonia. He had already been given Levaquin. While waiting for the patient's family to return, patient eloped   Lacretia Leigh, MD 12/25/16 [phone]

## 2016-12-25 NOTE — ED Notes (Signed)
Nurse attempted multiple times to call pt.'s daughter to update on pt.'s condition ( left message) but unable to reach her . EDP notified . Pt. assisted back to his room , he is confused /disoriented , utilized Optician, dispensing during encounter .

## 2016-12-25 NOTE — ED Notes (Signed)
[employer] notified of missing pt. Will send a car to perform welfare check on family as they are not answering.

## 2016-12-25 NOTE — Discharge Instructions (Signed)
Take tylenol, motrin for pain and fever and headaches.,   Take reglan for severe headaches.   Take doxycycline twice daily for a week for pneumonia.   See your doctor.   Return to ER if you have worse shortness of breath, chest pain, headaches, vomiting.

## 2017-01-28 ENCOUNTER — Emergency Department (HOSPITAL_COMMUNITY): Payer: Medicare Other

## 2017-01-28 ENCOUNTER — Other Ambulatory Visit: Payer: Self-pay

## 2017-01-28 ENCOUNTER — Encounter (HOSPITAL_COMMUNITY): Payer: Self-pay

## 2017-01-28 ENCOUNTER — Emergency Department (HOSPITAL_COMMUNITY)
Admission: EM | Admit: 2017-01-28 | Discharge: 2017-01-28 | Disposition: A | Discharge status: Home / Self Care | Payer: Medicare Other | Attending: Emergency Medicine | Admitting: Emergency Medicine

## 2017-01-28 DIAGNOSIS — R51 Headache: Secondary | ICD-10-CM | POA: Diagnosis present

## 2017-01-28 DIAGNOSIS — Z8546 Personal history of malignant neoplasm of prostate: Secondary | ICD-10-CM | POA: Insufficient documentation

## 2017-01-28 DIAGNOSIS — Z87891 Personal history of nicotine dependence: Secondary | ICD-10-CM | POA: Diagnosis not present

## 2017-01-28 DIAGNOSIS — G8929 Other chronic pain: Secondary | ICD-10-CM

## 2017-01-28 DIAGNOSIS — E7800 Pure hypercholesterolemia, unspecified: Secondary | ICD-10-CM | POA: Diagnosis not present

## 2017-01-28 LAB — COMPREHENSIVE METABOLIC PANEL
ALK PHOS: 55 U/L (ref 38–126)
ALT: 17 U/L (ref 17–63)
AST: 27 U/L (ref 15–41)
Albumin: 3.6 g/dL (ref 3.5–5.0)
Anion gap: 5 (ref 5–15)
BILIRUBIN TOTAL: 0.5 mg/dL (ref 0.3–1.2)
BUN: 13 mg/dL (ref 6–20)
CALCIUM: 8.6 mg/dL — AB (ref 8.9–10.3)
CO2: 24 mmol/L (ref 22–32)
CREATININE: 1.22 mg/dL (ref 0.61–1.24)
Chloride: 109 mmol/L (ref 101–111)
GFR, EST NON AFRICAN AMERICAN: 59 mL/min — AB (ref >60)
Glucose, Bld: 89 mg/dL (ref 65–99)
Potassium: 4.3 mmol/L (ref 3.5–5.1)
Sodium: 138 mmol/L (ref 135–145)
TOTAL PROTEIN: 6.8 g/dL (ref 6.5–8.1)

## 2017-01-28 LAB — CBC WITH DIFFERENTIAL/PLATELET
BASOS PCT: 1 %
Basophils Absolute: 0.1 10*3/uL (ref 0.0–0.1)
EOS ABS: 0.1 10*3/uL (ref 0.0–0.7)
EOS PCT: 3 %
HCT: 36.9 % — ABNORMAL LOW (ref 39.0–52.0)
Hemoglobin: 12.1 g/dL — ABNORMAL LOW (ref 13.0–17.0)
Lymphocytes Relative: 38 %
Lymphs Abs: 2 10*3/uL (ref 0.7–4.0)
MCH: 32.4 pg (ref 26.0–34.0)
MCHC: 32.8 g/dL (ref 30.0–36.0)
MCV: 98.9 fL (ref 78.0–100.0)
Monocytes Absolute: 0.4 10*3/uL (ref 0.1–1.0)
Monocytes Relative: 8 %
NEUTROS PCT: 50 %
Neutro Abs: 2.6 10*3/uL (ref 1.7–7.7)
PLATELETS: 233 10*3/uL (ref 150–400)
RBC: 3.73 MIL/uL — ABNORMAL LOW (ref 4.22–5.81)
RDW: 12.4 % (ref 11.5–15.5)
WBC: 5.1 10*3/uL (ref 4.0–10.5)

## 2017-01-28 LAB — URINALYSIS, ROUTINE W REFLEX MICROSCOPIC
BILIRUBIN URINE: NEGATIVE
Glucose, UA: NEGATIVE mg/dL
Hgb urine dipstick: NEGATIVE
KETONES UR: NEGATIVE mg/dL
Leukocytes, UA: NEGATIVE
NITRITE: NEGATIVE
PH: 7 (ref 5.0–8.0)
PROTEIN: NEGATIVE mg/dL
Specific Gravity, Urine: 1.008 (ref 1.005–1.030)

## 2017-01-28 LAB — RAPID STREP SCREEN (MED CTR MEBANE ONLY): STREPTOCOCCUS, GROUP A SCREEN (DIRECT): NEGATIVE

## 2017-01-28 MED ORDER — SODIUM CHLORIDE 0.9 % IV BOLUS (SEPSIS)
500.0000 mL | Freq: Once | INTRAVENOUS | Status: AC
  Administered 2017-01-28: 500 mL via INTRAVENOUS

## 2017-01-28 MED ORDER — ACETAMINOPHEN 500 MG PO TABS
1000.0000 mg | ORAL_TABLET | Freq: Once | ORAL | Status: AC
  Administered 2017-01-28: 1000 mg via ORAL
  Filled 2017-01-28: qty 2

## 2017-01-28 MED ORDER — TRAMADOL HCL 50 MG PO TABS: 50.0000 mg | ORAL_TABLET | Freq: Four times a day (QID) | ORAL | 0 refills | Status: DC | PRN

## 2017-01-28 NOTE — ED Notes (Signed)
Patient transported to CT scan . 

## 2017-01-28 NOTE — ED Notes (Addendum)
[facility] [employer] / staff notified that pt. is flight risk due to his dementia / history of elopement . Daughter advised to stay with pt. while he is in the hospital if possible.

## 2017-01-28 NOTE — ED Notes (Signed)
Safety sitter arrived .

## 2017-01-28 NOTE — ED Triage Notes (Signed)
Pt endorses headache for 1 week and was unable to sleep last night due to pain. No neuro deficits. Pt seen here before for same. VSS.
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
## 2017-01-28 NOTE — ED Notes (Signed)
Staffing office notified on pt.'s need for safety sitter .

## 2017-01-31 LAB — CULTURE, GROUP A STREP (THRC)

## 2017-02-08 ENCOUNTER — Emergency Department (HOSPITAL_COMMUNITY)
Admission: EM | Admit: 2017-02-08 | Discharge: 2017-02-09 | Disposition: A | Discharge status: Home / Self Care | Payer: Medicare Other | Attending: Emergency Medicine | Admitting: Emergency Medicine

## 2017-02-08 ENCOUNTER — Other Ambulatory Visit: Payer: Self-pay

## 2017-02-08 ENCOUNTER — Emergency Department (HOSPITAL_COMMUNITY): Payer: Medicare Other

## 2017-02-08 ENCOUNTER — Encounter (HOSPITAL_COMMUNITY): Payer: Self-pay

## 2017-02-08 DIAGNOSIS — S0003XA Contusion of scalp, initial encounter: Secondary | ICD-10-CM | POA: Diagnosis not present

## 2017-02-08 DIAGNOSIS — N289 Disorder of kidney and ureter, unspecified: Secondary | ICD-10-CM | POA: Insufficient documentation

## 2017-02-08 DIAGNOSIS — Z8546 Personal history of malignant neoplasm of prostate: Secondary | ICD-10-CM | POA: Insufficient documentation

## 2017-02-08 DIAGNOSIS — R51 Headache: Secondary | ICD-10-CM | POA: Diagnosis not present

## 2017-02-08 DIAGNOSIS — Y929 Unspecified place or not applicable: Secondary | ICD-10-CM | POA: Diagnosis not present

## 2017-02-08 DIAGNOSIS — W19XXXA Unspecified fall, initial encounter: Secondary | ICD-10-CM | POA: Diagnosis not present

## 2017-02-08 DIAGNOSIS — Y92009 Unspecified place in unspecified non-institutional (private) residence as the place of occurrence of the external cause: Secondary | ICD-10-CM

## 2017-02-08 DIAGNOSIS — Y999 Unspecified external cause status: Secondary | ICD-10-CM | POA: Insufficient documentation

## 2017-02-08 DIAGNOSIS — G8929 Other chronic pain: Secondary | ICD-10-CM | POA: Insufficient documentation

## 2017-02-08 DIAGNOSIS — Z79899 Other long term (current) drug therapy: Secondary | ICD-10-CM | POA: Diagnosis not present

## 2017-02-08 DIAGNOSIS — F0390 Unspecified dementia without behavioral disturbance: Secondary | ICD-10-CM | POA: Insufficient documentation

## 2017-02-08 DIAGNOSIS — S0990XA Unspecified injury of head, initial encounter: Secondary | ICD-10-CM | POA: Diagnosis present

## 2017-02-08 DIAGNOSIS — Y939 Activity, unspecified: Secondary | ICD-10-CM | POA: Diagnosis not present

## 2017-02-08 DIAGNOSIS — R296 Repeated falls: Secondary | ICD-10-CM

## 2017-02-08 LAB — CBC
HCT: 39.5 % (ref 39.0–52.0)
Hemoglobin: 13.2 g/dL (ref 13.0–17.0)
MCH: 33.3 pg (ref 26.0–34.0)
MCHC: 33.4 g/dL (ref 30.0–36.0)
MCV: 99.7 fL (ref 78.0–100.0)
PLATELETS: 234 10*3/uL (ref 150–400)
RBC: 3.96 MIL/uL — ABNORMAL LOW (ref 4.22–5.81)
RDW: 12.4 % (ref 11.5–15.5)
WBC: 4.7 10*3/uL (ref 4.0–10.5)

## 2017-02-08 LAB — COMPREHENSIVE METABOLIC PANEL
ALBUMIN: 4.1 g/dL (ref 3.5–5.0)
ALK PHOS: 65 U/L (ref 38–126)
ALT: 21 U/L (ref 17–63)
AST: 26 U/L (ref 15–41)
Anion gap: 6 (ref 5–15)
BILIRUBIN TOTAL: 0.4 mg/dL (ref 0.3–1.2)
BUN: 14 mg/dL (ref 6–20)
CALCIUM: 9.2 mg/dL (ref 8.9–10.3)
CO2: 26 mmol/L (ref 22–32)
CREATININE: 1.34 mg/dL — AB (ref 0.61–1.24)
Chloride: 106 mmol/L (ref 101–111)
GFR calc non Af Amer: 52 mL/min — ABNORMAL LOW (ref >60)
GLUCOSE: 96 mg/dL (ref 65–99)
Potassium: 4.3 mmol/L (ref 3.5–5.1)
SODIUM: 138 mmol/L (ref 135–145)
TOTAL PROTEIN: 7.9 g/dL (ref 6.5–8.1)

## 2017-02-08 LAB — ETHANOL

## 2017-02-08 MED ORDER — DIPHENHYDRAMINE HCL 50 MG/ML IJ SOLN
25.0000 mg | Freq: Once | INTRAMUSCULAR | Status: AC
  Administered 2017-02-09: 25 mg via INTRAVENOUS
  Filled 2017-02-08: qty 1

## 2017-02-08 MED ORDER — KETOROLAC TROMETHAMINE 30 MG/ML IJ SOLN
15.0000 mg | Freq: Once | INTRAMUSCULAR | Status: AC
  Administered 2017-02-09: 15 mg via INTRAVENOUS
  Filled 2017-02-08: qty 1

## 2017-02-08 MED ORDER — SODIUM CHLORIDE 0.9 % IV BOLUS (SEPSIS)
1000.0000 mL | Freq: Once | INTRAVENOUS | Status: AC
  Administered 2017-02-09: 1000 mL via INTRAVENOUS

## 2017-02-08 MED ORDER — METOCLOPRAMIDE HCL 5 MG/ML IJ SOLN
10.0000 mg | Freq: Once | INTRAMUSCULAR | Status: AC
  Administered 2017-02-09: 10 mg via INTRAVENOUS
  Filled 2017-02-08: qty 2

## 2017-02-08 MED ORDER — DEXAMETHASONE SODIUM PHOSPHATE 10 MG/ML IJ SOLN
10.0000 mg | Freq: Once | INTRAMUSCULAR | Status: AC
  Administered 2017-02-09: 10 mg via INTRAVENOUS
  Filled 2017-02-08: qty 1

## 2017-02-08 NOTE — ED Notes (Signed)
[phone]- Daughter Thaophan- Pt's Daughter had to step out but asked to be called if there are any questions.

## 2017-02-08 NOTE — ED Notes (Signed)
Patient assessed to the best of my ability with interpreters who could not fully understand the patient. Pt was noted to hit his head on Sunday and then have continual pain on the left side. Left pupil slightly larger than the right one. Equal grip strength and foot strength, both weak. Pt alert. Unable to fully assess oriented. Slurred speech noted by interpreters.
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
## 2017-02-08 NOTE — ED Triage Notes (Addendum)
Through Interpreter, Pt is slurring his speech and reports left head pain.   Pt is by himself at this time and interpreter is having a hard time understanding him due to the slurred speech. Pt continues to complain of his head hurting and pointing to the left of his head.   Second Interpreter Unable to understand. Attempted to Call Daughter with no success.

## 2017-02-08 NOTE — ED Notes (Signed)
Daughter up to NF asking how much longer. Explained process that treatment started with blood work and CT scan complete. Still many patients in front of him at this time.

## 2017-02-08 NOTE — ED Notes (Addendum)
Pt speaks [place]

## 2017-02-09 DIAGNOSIS — S0003XA Contusion of scalp, initial encounter: Secondary | ICD-10-CM | POA: Diagnosis not present

## 2017-02-09 LAB — RAPID URINE DRUG SCREEN, HOSP PERFORMED
Amphetamines: NOT DETECTED
BENZODIAZEPINES: NOT DETECTED
Barbiturates: NOT DETECTED
COCAINE: NOT DETECTED
OPIATES: NOT DETECTED
Tetrahydrocannabinol: NOT DETECTED

## 2017-02-09 LAB — URINALYSIS, ROUTINE W REFLEX MICROSCOPIC
Bilirubin Urine: NEGATIVE
Glucose, UA: NEGATIVE mg/dL
HGB URINE DIPSTICK: NEGATIVE
Ketones, ur: NEGATIVE mg/dL
Leukocytes, UA: NEGATIVE
Nitrite: NEGATIVE
PH: 7 (ref 5.0–8.0)
Protein, ur: NEGATIVE mg/dL
SPECIFIC GRAVITY, URINE: 1.008 (ref 1.005–1.030)

## 2017-02-09 LAB — SEDIMENTATION RATE: SED RATE: 23 mm/h — AB (ref 0–16)

## 2017-02-09 NOTE — ED Notes (Signed)
Patient currently at MRI

## 2017-02-09 NOTE — ED Provider Notes (Signed)
Patient signed out that was waiting for SW consult.    SW has assessed and spoken w pt and family who is at bedside.  They will give resources to family.  Will also order home health eval.    Lajean Saver, MD 02/09/17 [phone]

## 2017-02-09 NOTE — Discharge Planning (Signed)
[facility] consulted regarding placement. Pt will not qualify for placement from ED due to not having recent hospitalization and/or insurance coverage for such.  EDCM spoke with pt daughter at bedside regarding ALF placement and provided brochure [facility]) to begin the process of placing her dad. No further CM needs identified at this time.

## 2017-02-09 NOTE — Discharge Instructions (Signed)
It was our pleasure to provide your ER care today - we hope that you feel better.  Follow up with primary care doctor in the coming week.    We have made a home health services referral - the agency should be contacting you in the next couple days.  Return to ER if worse, new symptoms, fevers, trouble breathing, other concern.

## 2017-02-09 NOTE — ED Notes (Signed)
Up to bedside commode for bowel movement.

## 2017-02-09 NOTE — ED Notes (Signed)
Regular Soft Diet breakfast tray ordered @ [phone].

## 2017-02-09 NOTE — ED Notes (Signed)
Breakfast tray given. °

## 2017-07-09 NOTE — Congregational Nurse Program (Signed)
Congregational Nurse Program Note  Date of Encounter: 07/06/2017  Past Medical History: Past Medical History:  Diagnosis Date  . Adenomatous polyp of colon 06/2010   4 polyps removed at colonoscopy by Dr Henrene Pastor  . Anxiety   . Chronic headaches   . Depression   . Diverticulosis of colon 08/2010  . Internal hemorrhoids 08/2010  . Prostate cancer [facility])    receiving radiation treatment  . Subdural hematoma, chronic ([place])   . Tuberculosis     Encounter Details: CNP Questionnaire - 07/06/17 1149      Questionnaire   Patient Status  Immigrant    Race  Asian    Location Patient Served At  Not Applicable    Insurance  Medicaid    Uninsured  Not Applicable    Food  Yes, have food insecurities;Within past 12 months, worried food would run out with no money to buy more    Housing/Utilities  No permanent housing    Transportation  Yes, need transportation assistance    Interpersonal Safety  No, do not feel physically and emotionally safe where you currently live    Medication  No medication insecurities    Medical Provider  Yes    Referrals  Area Agency    ED Visit Averted  Not Applicable    Life-Saving Intervention Made  Not Applicable       Client was brought to the [facility] by an unknown male.  Client is [place] and does not speak [place].  Through the review of his chart I was able to locate his daughter who is listed as his emergency contact.  She was at work and stated she could not come get him and that he could not stay with her.  Discussed with her the client's situation that he was "homeless" and after the [facility] closes at 3 pm he will be on the street.  Daughter indicates there are no other family members who can assist.  TC placed with [facility] for assistance.  Through an interpreter, I talked with the client.  He speech is slurred and was difficult for the interpreter to understand.  It appears the client was evicted from his apartment due to non payment.  He thought he  could stay here at the [facility], believing it to be a shelter.  Client began making calls to his daughter.  I talked to the daughter again and she stated she would pick him up after she got off work.  Client was instructed by the daughter to stay [place] (near by the [facility]) until she got off work.    Discussed with daughter resources to assist her through [employer].  Provided contact information and encouraged her to call.

## 2017-07-13 ENCOUNTER — Emergency Department (HOSPITAL_COMMUNITY)
Admission: EM | Admit: 2017-07-13 | Discharge: 2017-07-14 | Disposition: A | Discharge status: Home / Self Care | Payer: Medicare Other | Attending: Emergency Medicine | Admitting: Emergency Medicine

## 2017-07-13 ENCOUNTER — Other Ambulatory Visit: Payer: Self-pay

## 2017-07-13 ENCOUNTER — Emergency Department (HOSPITAL_COMMUNITY): Payer: Medicare Other

## 2017-07-13 ENCOUNTER — Encounter (HOSPITAL_COMMUNITY): Payer: Self-pay

## 2017-07-13 DIAGNOSIS — D649 Anemia, unspecified: Secondary | ICD-10-CM

## 2017-07-13 DIAGNOSIS — R748 Abnormal levels of other serum enzymes: Secondary | ICD-10-CM

## 2017-07-13 DIAGNOSIS — R404 Transient alteration of awareness: Secondary | ICD-10-CM | POA: Diagnosis not present

## 2017-07-13 DIAGNOSIS — W19XXXA Unspecified fall, initial encounter: Secondary | ICD-10-CM

## 2017-07-13 DIAGNOSIS — R4182 Altered mental status, unspecified: Secondary | ICD-10-CM | POA: Diagnosis present

## 2017-07-13 DIAGNOSIS — Z87891 Personal history of nicotine dependence: Secondary | ICD-10-CM | POA: Diagnosis not present

## 2017-07-13 LAB — URINALYSIS, ROUTINE W REFLEX MICROSCOPIC
Bilirubin Urine: NEGATIVE
Glucose, UA: NEGATIVE mg/dL
Hgb urine dipstick: NEGATIVE
Ketones, ur: NEGATIVE mg/dL
Leukocytes, UA: NEGATIVE
Nitrite: NEGATIVE
Protein, ur: NEGATIVE mg/dL
SPECIFIC GRAVITY, URINE: 1.017 (ref 1.005–1.030)
pH: 7 (ref 5.0–8.0)

## 2017-07-13 LAB — COMPREHENSIVE METABOLIC PANEL
ALBUMIN: 3.4 g/dL — AB (ref 3.5–5.0)
ALK PHOS: 68 U/L (ref 38–126)
ALT: 49 U/L (ref 17–63)
AST: 57 U/L — AB (ref 15–41)
Anion gap: 7 (ref 5–15)
BILIRUBIN TOTAL: 0.4 mg/dL (ref 0.3–1.2)
BUN: 25 mg/dL — AB (ref 6–20)
CALCIUM: 8.5 mg/dL — AB (ref 8.9–10.3)
CO2: 24 mmol/L (ref 22–32)
Chloride: 112 mmol/L — ABNORMAL HIGH (ref 101–111)
Creatinine, Ser: 1.32 mg/dL — ABNORMAL HIGH (ref 0.61–1.24)
GFR calc Af Amer: >60 mL/min (ref >60)
GFR calc non Af Amer: 53 mL/min — ABNORMAL LOW (ref >60)
GLUCOSE: 99 mg/dL (ref 65–99)
POTASSIUM: 3.9 mmol/L (ref 3.5–5.1)
Sodium: 143 mmol/L (ref 135–145)
TOTAL PROTEIN: 6.8 g/dL (ref 6.5–8.1)

## 2017-07-13 LAB — I-STAT TROPONIN, ED: TROPONIN I, POC: 0 ng/mL (ref 0.00–0.08)

## 2017-07-13 LAB — RAPID URINE DRUG SCREEN, HOSP PERFORMED
Amphetamines: NOT DETECTED
BARBITURATES: NOT DETECTED
BENZODIAZEPINES: NOT DETECTED
COCAINE: NOT DETECTED
Opiates: NOT DETECTED
TETRAHYDROCANNABINOL: NOT DETECTED

## 2017-07-13 LAB — LIPASE, BLOOD: Lipase: 80 U/L — ABNORMAL HIGH (ref 11–51)

## 2017-07-13 LAB — ACETAMINOPHEN LEVEL: Acetaminophen (Tylenol), Serum: <10 ug/mL — ABNORMAL LOW (ref 10–30)

## 2017-07-13 LAB — CBC
HCT: 31.8 % — ABNORMAL LOW (ref 39.0–52.0)
Hemoglobin: 10.6 g/dL — ABNORMAL LOW (ref 13.0–17.0)
MCH: 32.9 pg (ref 26.0–34.0)
MCHC: 33.3 g/dL (ref 30.0–36.0)
MCV: 98.8 fL (ref 78.0–100.0)
Platelets: 237 10*3/uL (ref 150–400)
RBC: 3.22 MIL/uL — ABNORMAL LOW (ref 4.22–5.81)
RDW: 12.8 % (ref 11.5–15.5)
WBC: 4.9 10*3/uL (ref 4.0–10.5)

## 2017-07-13 LAB — CBG MONITORING, ED: Glucose-Capillary: 87 mg/dL (ref 65–99)

## 2017-07-13 LAB — ETHANOL: Alcohol, Ethyl (B): <10 mg/dL (ref <10)

## 2017-07-13 LAB — SALICYLATE LEVEL

## 2017-07-13 LAB — PROTIME-INR
INR: 0.9
PROTHROMBIN TIME: 12.1 s (ref 11.4–15.2)

## 2017-07-13 LAB — AMMONIA: Ammonia: 23 umol/L (ref 9–35)

## 2017-07-13 NOTE — ED Notes (Signed)
Although in a previous comment, pt denied pain. Pt motioned to the writer that he wanted to take some acetaminophen in his bag.
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
## 2017-07-13 NOTE — ED Notes (Signed)
ED Provider at bedside. 

## 2017-07-13 NOTE — Discharge Instructions (Signed)
Your work-up today showed no evidence of acute trauma.  We did find evidence of mild anemia compared to prior as well as slightly elevated lipase.  Please stay hydrated and follow-up with your primary doctor for reassessment of these values.  If any symptoms change or worsen, please return to the nearest emergency department.
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
## 2017-07-13 NOTE — ED Triage Notes (Signed)
Pt denies any ETOH use. Pt denies any pain.
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
## 2017-07-14 NOTE — ED Notes (Signed)
Pt given sandwich, SW consult is active, they have been notified to call/come see pt.

## 2017-07-14 NOTE — ED Notes (Signed)
Bed: [facility] Expected date:  Expected time:  Means of arrival:  Comments: 

## 2017-07-14 NOTE — ED Notes (Signed)
Pt has been instructed using translator about rescheduling dentist appointment due to ill fitting dentures. He did not want to leave, stating "too cold to go out, I will sit in lobby" pt was made aware he will be discharged and leave facility to return to area of leaving. After discussion, pt discharged and security assisted pt to bus stop. Discharge instructions printed in [place] for pt.

## 2017-07-14 NOTE — ED Notes (Addendum)
Pt placed as boarder. Pt continued to have increased confusion and unable to properly verify his address when [facility] arrived to take him home. When asking pt address, pt making statements with translator stating "No home" and when asking translator to verify pt address, pt unable to understand request and continues to answer inappropriately. Pt had previously stated (via help from translator) that he needs a ride home because he does not drive. Unable to make contact with family. Unable to confirm address by other means. Will continue to attempt to make contact with pts daughter.

## 2017-07-14 NOTE — ED Notes (Signed)
[name], SW is talking with pt regarding discharge from facility using interpretor computer to help with translation
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
## 2017-07-15 LAB — URINE CULTURE: Culture: NO GROWTH

## 2017-08-04 ENCOUNTER — Emergency Department (HOSPITAL_COMMUNITY): Payer: Medicare Other

## 2017-08-04 ENCOUNTER — Emergency Department (HOSPITAL_COMMUNITY)
Admission: EM | Admit: 2017-08-04 | Discharge: 2017-08-05 | Disposition: A | Discharge status: Home / Self Care | Payer: Medicare Other | Attending: Emergency Medicine | Admitting: Emergency Medicine

## 2017-08-04 ENCOUNTER — Other Ambulatory Visit: Payer: Self-pay

## 2017-08-04 ENCOUNTER — Encounter (HOSPITAL_COMMUNITY): Payer: Self-pay

## 2017-08-04 DIAGNOSIS — F0390 Unspecified dementia without behavioral disturbance: Secondary | ICD-10-CM | POA: Diagnosis not present

## 2017-08-04 DIAGNOSIS — Z5900 Homelessness unspecified: Secondary | ICD-10-CM

## 2017-08-04 DIAGNOSIS — R519 Headache, unspecified: Secondary | ICD-10-CM

## 2017-08-04 DIAGNOSIS — Z79899 Other long term (current) drug therapy: Secondary | ICD-10-CM | POA: Insufficient documentation

## 2017-08-04 DIAGNOSIS — G9341 Metabolic encephalopathy: Secondary | ICD-10-CM | POA: Diagnosis not present

## 2017-08-04 DIAGNOSIS — Z87891 Personal history of nicotine dependence: Secondary | ICD-10-CM | POA: Diagnosis not present

## 2017-08-04 DIAGNOSIS — R479 Unspecified speech disturbances: Secondary | ICD-10-CM

## 2017-08-04 DIAGNOSIS — R51 Headache: Secondary | ICD-10-CM | POA: Diagnosis not present

## 2017-08-04 LAB — DIFFERENTIAL
ABS IMMATURE GRANULOCYTES: 0 10*3/uL (ref 0.0–0.1)
Basophils Absolute: 0 10*3/uL (ref 0.0–0.1)
Basophils Relative: 1 %
EOS ABS: 0.1 10*3/uL (ref 0.0–0.7)
Eosinophils Relative: 1 %
Immature Granulocytes: 1 %
LYMPHS ABS: 1.7 10*3/uL (ref 0.7–4.0)
LYMPHS PCT: 34 %
MONOS PCT: 9 %
Monocytes Absolute: 0.4 10*3/uL (ref 0.1–1.0)
Neutro Abs: 2.8 10*3/uL (ref 1.7–7.7)
Neutrophils Relative %: 54 %

## 2017-08-04 LAB — COMPREHENSIVE METABOLIC PANEL
ALK PHOS: 63 U/L (ref 38–126)
ALT: 16 U/L — ABNORMAL LOW (ref 17–63)
AST: 24 U/L (ref 15–41)
Albumin: 3.7 g/dL (ref 3.5–5.0)
Anion gap: 5 (ref 5–15)
BILIRUBIN TOTAL: 0.9 mg/dL (ref 0.3–1.2)
BUN: 14 mg/dL (ref 6–20)
CALCIUM: 9.2 mg/dL (ref 8.9–10.3)
CO2: 27 mmol/L (ref 22–32)
CREATININE: 1.23 mg/dL (ref 0.61–1.24)
Chloride: 108 mmol/L (ref 101–111)
GFR calc Af Amer: >60 mL/min (ref >60)
GFR calc non Af Amer: 58 mL/min — ABNORMAL LOW (ref >60)
Glucose, Bld: 89 mg/dL (ref 65–99)
POTASSIUM: 4.1 mmol/L (ref 3.5–5.1)
Sodium: 140 mmol/L (ref 135–145)
TOTAL PROTEIN: 7.1 g/dL (ref 6.5–8.1)

## 2017-08-04 LAB — RAPID URINE DRUG SCREEN, HOSP PERFORMED
Amphetamines: NOT DETECTED
BARBITURATES: NOT DETECTED
Benzodiazepines: NOT DETECTED
COCAINE: NOT DETECTED
Opiates: NOT DETECTED
TETRAHYDROCANNABINOL: NOT DETECTED

## 2017-08-04 LAB — I-STAT CHEM 8, ED
BUN: 19 mg/dL (ref 6–20)
CREATININE: 1 mg/dL (ref 0.61–1.24)
Calcium, Ion: 1.21 mmol/L (ref 1.15–1.40)
Chloride: 104 mmol/L (ref 101–111)
Glucose, Bld: 88 mg/dL (ref 65–99)
HEMATOCRIT: 34 % — AB (ref 39.0–52.0)
Hemoglobin: 11.6 g/dL — ABNORMAL LOW (ref 13.0–17.0)
Potassium: 4.4 mmol/L (ref 3.5–5.1)
SODIUM: 141 mmol/L (ref 135–145)
TCO2: 29 mmol/L (ref 22–32)

## 2017-08-04 LAB — I-STAT TROPONIN, ED: TROPONIN I, POC: 0.01 ng/mL (ref 0.00–0.08)

## 2017-08-04 LAB — CBC
HCT: 35.7 % — ABNORMAL LOW (ref 39.0–52.0)
Hemoglobin: 12 g/dL — ABNORMAL LOW (ref 13.0–17.0)
MCH: 32.9 pg (ref 26.0–34.0)
MCHC: 33.6 g/dL (ref 30.0–36.0)
MCV: 97.8 fL (ref 78.0–100.0)
PLATELETS: 262 10*3/uL (ref 150–400)
RBC: 3.65 MIL/uL — AB (ref 4.22–5.81)
RDW: 12.3 % (ref 11.5–15.5)
WBC: 5.1 10*3/uL (ref 4.0–10.5)

## 2017-08-04 LAB — URINALYSIS, ROUTINE W REFLEX MICROSCOPIC
Bilirubin Urine: NEGATIVE
Glucose, UA: NEGATIVE mg/dL
Hgb urine dipstick: NEGATIVE
KETONES UR: NEGATIVE mg/dL
LEUKOCYTES UA: NEGATIVE
NITRITE: NEGATIVE
PROTEIN: NEGATIVE mg/dL
Specific Gravity, Urine: 1.009 (ref 1.005–1.030)
pH: 6 (ref 5.0–8.0)

## 2017-08-04 LAB — APTT: aPTT: 31 seconds (ref 24–36)

## 2017-08-04 LAB — ETHANOL: Alcohol, Ethyl (B): <10 mg/dL (ref <10)

## 2017-08-04 LAB — PROTIME-INR
INR: 0.96
Prothrombin Time: 12.7 seconds (ref 11.4–15.2)

## 2017-08-04 MED ORDER — DIPHENHYDRAMINE HCL 50 MG/ML IJ SOLN
12.5000 mg | Freq: Once | INTRAMUSCULAR | Status: AC
  Administered 2017-08-04: 12.5 mg via INTRAVENOUS
  Filled 2017-08-04: qty 1

## 2017-08-04 MED ORDER — IOPAMIDOL (ISOVUE-370) INJECTION 76%
INTRAVENOUS | Status: AC
  Administered 2017-08-04: 15:00:00
  Filled 2017-08-04: qty 100

## 2017-08-04 MED ORDER — KETOROLAC TROMETHAMINE 30 MG/ML IJ SOLN
15.0000 mg | Freq: Once | INTRAMUSCULAR | Status: AC
  Administered 2017-08-04: 15 mg via INTRAVENOUS
  Filled 2017-08-04: qty 1

## 2017-08-04 MED ORDER — SODIUM CHLORIDE 0.9 % IV BOLUS
500.0000 mL | Freq: Once | INTRAVENOUS | Status: AC
  Administered 2017-08-04: 500 mL via INTRAVENOUS

## 2017-08-04 MED ORDER — PROCHLORPERAZINE EDISYLATE 10 MG/2ML IJ SOLN
5.0000 mg | Freq: Once | INTRAMUSCULAR | Status: AC
  Administered 2017-08-04: 5 mg via INTRAVENOUS
  Filled 2017-08-04: qty 2

## 2017-08-04 NOTE — ED Notes (Signed)
Pt's daughter's number: [phone]

## 2017-08-04 NOTE — ED Notes (Signed)
Patient transported to MRI 

## 2017-08-04 NOTE — ED Triage Notes (Signed)
Pt was seen at his Urologists office when an interpretor noticed that the patients speech was slurred and garbled and pt unable to communicate clearly.  Per EMS, urology also reported Right sided facial droop.  Pt speaks no english, only vietnamese.  Pt's VSS upon arrival to ED.  Interpretation being called at this time
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
## 2017-08-04 NOTE — ED Notes (Signed)
Pt returned from CT at this time.  

## 2017-08-04 NOTE — ED Provider Notes (Signed)
Medical screening examination/treatment/procedure(s) were conducted as a shared visit with non-physician practitioner(s) and myself.  I personally evaluated the patient during the encounter.  None History has been obtained through [facility] via interpreter.  The historical information has been challenging due to language barrier.  Patient had presented to urologic office with garbled speech and difficulty communicating.  Also reportedly a right-sided facial droop.  I have evaluated the patient after his initial course of evaluation and scans.  As I evaluate him he is awake and alert.  There is no somnolence.  He is attempting to use his cellular device.  He is using both hands and this endeavor.  He also moves both lower extremities at will.  No clear motor deficit.  Consultations have been made to neurology.  Agree with plan of management.   Charlesetta Shanks, MD 08/04/17 [phone]

## 2017-08-04 NOTE — Progress Notes (Addendum)
CSW received consult. Attempted to call both emergency contacts in pt's chart. No answer for both. Pt's daughters number, [phone], did not allow for voicemail to be left.   CSW called for non emergency police for a wellness check on address listed in the chart.   According to previous notes, pt is experiencing homelessness. CSW will call local shelters to check bed availability.   CSW called DSS to make APS report, waiting for on call social worker to call back to make report.   Update Per [employer] officer that conducted wellness check at address listed in the chart, "a male Asian answered the door, and said he is the only person that lives there."  CSW reached out to [place] to see what additional resources/ assistance may be available to pt.   Wendelyn Breslow, [facility]  [phone]

## 2017-08-04 NOTE — Consult Note (Signed)
NEURO HOSPITALIST CONSULT NOTE    Requesting Physician: Dr. Eulis Foster    Chief Complaint:  Slurred speech,   History obtained from:  Patient  Chart review/ interpreter  HPI:                                                                                                                                         [name] is an 72 y.o. Young with a PMH of chronic headaches, subdural hematoma who presents to emergency room for slurred speech/not making sense while in the Urology clinic.   Patient went to urology appointment today for BPH. While using the interpreter at the urology office the interpreter noted that the patient had slurred speech, and the interpreter was having trouble understanding.  The interpreter states that the only thing they could understand was the patient say with "headache". We attempted to use interpreter [name] [phone], and he also stated that he was having trouble understanding the patient because of slurred speech.   He described it as the patient would start talking and then he would mumble off and become incoherent.   Patient has chronic history of headaches, anxiety, depression. Sees Dr Delice Lesch in clinic. Appears to have some chronic right leg weakness noted in exam by Dr Delice Lesch.   CT of head: No  large vessel occlusion CTA of head and neck: . No emergent large vessel occlusion or hemodynamically significant stenosis within the head or neck.  No acute hemorrhage.  ASPECTS = 10.  Mild aortic atherosclerosis    MRI of brain pending CT Perfusion ordered  Date last known well: Unable to determine Time last known well: Unable to determine tPA Given: No: no stroke     Past Medical History:  Diagnosis Date  . Adenomatous polyp of colon 06/2010   4 polyps removed at colonoscopy by Dr Henrene Pastor  . Anxiety   . Chronic headaches   . Depression   . Diverticulosis of colon 08/2010  . Internal hemorrhoids 08/2010  . Prostate cancer [facility])    receiving  radiation treatment  . Subdural hematoma, chronic ([place])   . Tuberculosis     Past Surgical History:  Procedure Laterality Date  . FLEXIBLE SIGMOIDOSCOPY  03/29/2012   Procedure: FLEXIBLE SIGMOIDOSCOPY;  Surgeon: Inda Castle, MD;  Location: [place];  Service: Endoscopy;  Laterality: N/A;  possibly may do colonoscopy but prep is only for a flex  . FLEXIBLE SIGMOIDOSCOPY  03/31/2012   Procedure: FLEXIBLE SIGMOIDOSCOPY;  Surgeon: Inda Castle, MD;  Location: [place];  Service: Endoscopy;  Laterality: N/A;  . HEMORRHOID BANDING  03/31/2012   Procedure: HEMORRHOID BANDING;  Surgeon: Inda Castle, MD;  Location: [place];  Service: Endoscopy;  Laterality: N/A;  . MULTIPLE EXTRACTIONS WITH ALVEOLOPLASTY N/A 08/08/2013   Procedure: MULTIPLE EXTRACTION OF TEETH #1, 2, 3, 6, Ross, 9, 11, 15, 16, 17, 20, 21, 22, 23, 24,  26, 29, 30, 32 WITH ALVEOLOPLASTY AND REMOVAL BUCCAL EXOSTOSIS LEFT MAXILLA;  Surgeon: Gae Bon, DDS;  Location: [place];  Service: Oral Surgery;  Laterality: N/A;  . shrapnel removal     skull during [place] War    History reviewed. No pertinent family history.     Social History:  reports that he quit smoking about 25 years ago. His smoking use included cigarettes. He has a 1.25 pack-year smoking history. He has never used smokeless tobacco. He reports that he does not drink alcohol or use drugs.  Allergies: No Known Allergies  Medications:                                                                                                                            Current Facility-Administered Medications  Medication Dose Route Frequency Provider Last Rate Last Dose  . 0.9 %  sodium chloride infusion  500 mL Intravenous Continuous Irene Shipper, MD      . sodium chloride 0.9 % bolus 500 mL  500 mL Intravenous Once Jillyn Ledger, PA-C       Current Outpatient Medications  Medication Sig Dispense Refill  . doxycycline (VIBRAMYCIN) 100 MG capsule Take 1  capsule (100 mg total) by mouth 2 (two) times daily. One po bid x Ross days 14 capsule 0  . metoCLOPramide (REGLAN) 10 MG tablet Take 1 tablet (10 mg total) by mouth every 6 (six) hours as needed for nausea (nausea/headache). 6 tablet 0  . traMADol (ULTRAM) 50 MG tablet Take 1 tablet (50 mg total) every 6 (six) hours as needed by mouth. 10 tablet 0     ROS:                                                                                                                                       History obtained  Unable to obtain due to language barrier and dysrthria  Neurological ROS: as noted in HPI   General Examination:  Blood pressure 126/89, pulse 74, temperature 98.Ross F (37.1 C), temperature source Oral, resp. rate 17, height 5' (1.524 m), weight 56.Ross kg (125 lb), SpO2 100 %.  HEENT-  Normocephalic, no lesions, without obvious abnormality. Patient does wear dentures. Top dentures were not in mouth. Normal external eye and conjunctiva.   Cardiovascular- S1-S2 audible, pulses palpable throughout   Lungs-no excessive working breathing.  Saturations within normal limits Extremities- Warm, dry and intact Musculoskeletal-no joint tenderness, deformity or swelling Skin-warm and dry,   Neurological Examination Mental Status: Alert,  Dysarthria noted ( per interpreter).  Able to follow simple commands Cranial Nerves: II: no obvious visual field neglect III,IV, VI: ptosis not present, extra-ocular motions intact bilaterally, pupils equal, round, reactive to light and accommodation V,VII: smile symmetric, facial light touch sensation normal bilaterally VIII: hearing normal bilaterally IX,X: uvula rises symmetrically XI: not tested: XII: midline tongue extension Motor: Right : Upper extremity   4/5    Left:     Upper extremity   4/5  Lower extremity   4/5     Lower extremity   4/5 Tone and  bulk:normal tone throughout; no atrophy noted Sensory: UTA Deep Tendon Reflexes: 2+ and symmetric throughout Plantars: Right: downgoing   Left: downgoing Cerebellar: UTA Gait: UTA   Lab Results: Basic Metabolic Panel: Recent Labs  Lab 08/04/17 1349 08/04/17 1406  NA 140 141  K 4.1 4.4  CL 108 104  CO2 27  --   GLUCOSE 89 88  BUN 14 19  CREATININE 1.23 1.00  CALCIUM 9.2  --     CBC: Recent Labs  Lab 08/04/17 1349 08/04/17 1406  WBC 5.1  --   NEUTROABS 2.8  --   HGB 12.0* 11.6*  HCT 35.Ross* 34.0*  MCV 97.8  --   PLT 262  --     Lipid Panel: No results for input(s): CHOL, TRIG, HDL, CHOLHDL, VLDL, LDLCALC in the last 168 hours.  CBG: No results for input(s): GLUCAP in the last 168 hours.  Imaging: Ct Angio Head W Or Wo Contrast  Result Date: 08/04/2017 CLINICAL DATA:  Slurred speech and right-sided facial droop. EXAM: CT ANGIOGRAPHY HEAD AND NECK TECHNIQUE: Multidetector CT imaging of the head and neck was performed using the standard protocol during bolus administration of intravenous contrast. Multiplanar CT image reconstructions and MIPs were obtained to evaluate the vascular anatomy. Carotid stenosis measurements (when applicable) are obtained utilizing NASCET criteria, using the distal internal carotid diameter as the denominator. CONTRAST:  50 mL ISOVUE-370 IOPAMIDOL (ISOVUE-370) INJECTION 76% COMPARISON:  Head CT 07/13/2017 FINDINGS: CT HEAD FINDINGS BRAIN: No mass lesion, intraparenchymal hemorrhage or extra-axial collection. No evidence of acute cortical infarct. There is chronic dural thickening along the left frontal convexity, unchanged from 07/13/2017. Periventricular hypoattenuation suggesting chronic microvascular disease. VASCULAR: No hyperdense vessel or unexpected vascular calcification. SKULL: Normal visualized skull base, calvarium and extracranial soft tissues. SINUSES/ORBITS: No sinus fluid levels or advanced mucosal thickening. No mastoid effusion.  Normal orbits. ASPECTS ([place] Stroke Program Early CT Score) - Ganglionic level infarction (caudate, lentiform nuclei, internal capsule, insula, M1-M3 cortex): Ross - Supraganglionic infarction (M4-M6 cortex): 3 Total score (0-10 with 10 being normal): 10 Review of the MIP images confirms the above findings CTA NECK FINDINGS AORTIC ARCH: There is mild calcific atherosclerosis of the aortic arch. There is no aneurysm, dissection or hemodynamically significant stenosis of the visualized ascending aorta and aortic arch. Conventional 3 vessel aortic branching pattern. The visualized proximal subclavian arteries are widely patent. RIGHT  CAROTID SYSTEM: --Common carotid artery: Widely patent origin without common carotid artery dissection or aneurysm. --Internal carotid artery: No dissection, occlusion or aneurysm. No hemodynamically significant stenosis. --External carotid artery: No acute abnormality. LEFT CAROTID SYSTEM: --Common carotid artery: Widely patent origin without common carotid artery dissection or aneurysm. --Internal carotid artery:No dissection, occlusion or aneurysm. No hemodynamically significant stenosis. --External carotid artery: No acute abnormality. VERTEBRAL ARTERIES: Codominant configuration. Both origins are normal. No dissection, occlusion or flow-limiting stenosis to the vertebrobasilar confluence. SKELETON: There is no bony spinal canal stenosis. No lytic or blastic lesion. OTHER NECK: Normal pharynx, larynx and major salivary glands. No cervical lymphadenopathy. Unremarkable thyroid gland. UPPER CHEST: No pneumothorax or pleural effusion. No nodules or masses. CTA HEAD FINDINGS ANTERIOR CIRCULATION: --Intracranial internal carotid arteries: Normal. --Anterior cerebral arteries: Normal. Both A1 segments are present. Patent anterior communicating artery. --Middle cerebral arteries: Normal. --Posterior communicating arteries: Absent bilaterally. POSTERIOR CIRCULATION: --Basilar artery: Normal.  --Posterior cerebral arteries: Normal. --Superior cerebellar arteries: Normal. --Inferior cerebellar arteries: Normal anterior and posterior inferior cerebellar arteries. VENOUS SINUSES: As permitted by contrast timing, patent. ANATOMIC VARIANTS: None DELAYED PHASE: Not performed. Review of the MIP images confirms the above findings. IMPRESSION: 1. No emergent large vessel occlusion or hemodynamically significant stenosis within the head or neck. 2. Left frontal convexity dural thickening at the site old subdural hematoma. 3. No acute hemorrhage.  ASPECTS = 10. 4. Mild aortic atherosclerosis (ICD10-I70.0). Electronically Signed   By: Ulyses Jarred M.D.   On: 08/04/2017 15:45   Ct Head Wo Contrast  Result Date: 08/04/2017 CLINICAL DATA:  Slurred speech and right-sided facial droop. EXAM: CT ANGIOGRAPHY HEAD AND NECK TECHNIQUE: Multidetector CT imaging of the head and neck was performed using the standard protocol during bolus administration of intravenous contrast. Multiplanar CT image reconstructions and MIPs were obtained to evaluate the vascular anatomy. Carotid stenosis measurements (when applicable) are obtained utilizing NASCET criteria, using the distal internal carotid diameter as the denominator. CONTRAST:  50 mL ISOVUE-370 IOPAMIDOL (ISOVUE-370) INJECTION 76% COMPARISON:  Head CT 07/13/2017 FINDINGS: CT HEAD FINDINGS BRAIN: No mass lesion, intraparenchymal hemorrhage or extra-axial collection. No evidence of acute cortical infarct. There is chronic dural thickening along the left frontal convexity, unchanged from 07/13/2017. Periventricular hypoattenuation suggesting chronic microvascular disease. VASCULAR: No hyperdense vessel or unexpected vascular calcification. SKULL: Normal visualized skull base, calvarium and extracranial soft tissues. SINUSES/ORBITS: No sinus fluid levels or advanced mucosal thickening. No mastoid effusion. Normal orbits. ASPECTS ([place] Stroke Program Early CT Score) -  Ganglionic level infarction (caudate, lentiform nuclei, internal capsule, insula, M1-M3 cortex): Ross - Supraganglionic infarction (M4-M6 cortex): 3 Total score (0-10 with 10 being normal): 10 Review of the MIP images confirms the above findings CTA NECK FINDINGS AORTIC ARCH: There is mild calcific atherosclerosis of the aortic arch. There is no aneurysm, dissection or hemodynamically significant stenosis of the visualized ascending aorta and aortic arch. Conventional 3 vessel aortic branching pattern. The visualized proximal subclavian arteries are widely patent. RIGHT CAROTID SYSTEM: --Common carotid artery: Widely patent origin without common carotid artery dissection or aneurysm. --Internal carotid artery: No dissection, occlusion or aneurysm. No hemodynamically significant stenosis. --External carotid artery: No acute abnormality. LEFT CAROTID SYSTEM: --Common carotid artery: Widely patent origin without common carotid artery dissection or aneurysm. --Internal carotid artery:No dissection, occlusion or aneurysm. No hemodynamically significant stenosis. --External carotid artery: No acute abnormality. VERTEBRAL ARTERIES: Codominant configuration. Both origins are normal. No dissection, occlusion or flow-limiting stenosis to the vertebrobasilar confluence. SKELETON: There is no bony spinal  canal stenosis. No lytic or blastic lesion. OTHER NECK: Normal pharynx, larynx and major salivary glands. No cervical lymphadenopathy. Unremarkable thyroid gland. UPPER CHEST: No pneumothorax or pleural effusion. No nodules or masses. CTA HEAD FINDINGS ANTERIOR CIRCULATION: --Intracranial internal carotid arteries: Normal. --Anterior cerebral arteries: Normal. Both A1 segments are present. Patent anterior communicating artery. --Middle cerebral arteries: Normal. --Posterior communicating arteries: Absent bilaterally. POSTERIOR CIRCULATION: --Basilar artery: Normal. --Posterior cerebral arteries: Normal. --Superior cerebellar  arteries: Normal. --Inferior cerebellar arteries: Normal anterior and posterior inferior cerebellar arteries. VENOUS SINUSES: As permitted by contrast timing, patent. ANATOMIC VARIANTS: None DELAYED PHASE: Not performed. Review of the MIP images confirms the above findings. IMPRESSION: 1. No emergent large vessel occlusion or hemodynamically significant stenosis within the head or neck. 2. Left frontal convexity dural thickening at the site old subdural hematoma. 3. No acute hemorrhage.  ASPECTS = 10. 4. Mild aortic atherosclerosis (ICD10-I70.0). Electronically Signed   By: Ulyses Jarred M.D.   On: 08/04/2017 15:45   Ct Angio Neck W Or Wo Contrast  Result Date: 08/04/2017 CLINICAL DATA:  Slurred speech and right-sided facial droop. EXAM: CT ANGIOGRAPHY HEAD AND NECK TECHNIQUE: Multidetector CT imaging of the head and neck was performed using the standard protocol during bolus administration of intravenous contrast. Multiplanar CT image reconstructions and MIPs were obtained to evaluate the vascular anatomy. Carotid stenosis measurements (when applicable) are obtained utilizing NASCET criteria, using the distal internal carotid diameter as the denominator. CONTRAST:  50 mL ISOVUE-370 IOPAMIDOL (ISOVUE-370) INJECTION 76% COMPARISON:  Head CT 07/13/2017 FINDINGS: CT HEAD FINDINGS BRAIN: No mass lesion, intraparenchymal hemorrhage or extra-axial collection. No evidence of acute cortical infarct. There is chronic dural thickening along the left frontal convexity, unchanged from 07/13/2017. Periventricular hypoattenuation suggesting chronic microvascular disease. VASCULAR: No hyperdense vessel or unexpected vascular calcification. SKULL: Normal visualized skull base, calvarium and extracranial soft tissues. SINUSES/ORBITS: No sinus fluid levels or advanced mucosal thickening. No mastoid effusion. Normal orbits. ASPECTS ([place] Stroke Program Early CT Score) - Ganglionic level infarction (caudate, lentiform nuclei,  internal capsule, insula, M1-M3 cortex): Ross - Supraganglionic infarction (M4-M6 cortex): 3 Total score (0-10 with 10 being normal): 10 Review of the MIP images confirms the above findings CTA NECK FINDINGS AORTIC ARCH: There is mild calcific atherosclerosis of the aortic arch. There is no aneurysm, dissection or hemodynamically significant stenosis of the visualized ascending aorta and aortic arch. Conventional 3 vessel aortic branching pattern. The visualized proximal subclavian arteries are widely patent. RIGHT CAROTID SYSTEM: --Common carotid artery: Widely patent origin without common carotid artery dissection or aneurysm. --Internal carotid artery: No dissection, occlusion or aneurysm. No hemodynamically significant stenosis. --External carotid artery: No acute abnormality. LEFT CAROTID SYSTEM: --Common carotid artery: Widely patent origin without common carotid artery dissection or aneurysm. --Internal carotid artery:No dissection, occlusion or aneurysm. No hemodynamically significant stenosis. --External carotid artery: No acute abnormality. VERTEBRAL ARTERIES: Codominant configuration. Both origins are normal. No dissection, occlusion or flow-limiting stenosis to the vertebrobasilar confluence. SKELETON: There is no bony spinal canal stenosis. No lytic or blastic lesion. OTHER NECK: Normal pharynx, larynx and major salivary glands. No cervical lymphadenopathy. Unremarkable thyroid gland. UPPER CHEST: No pneumothorax or pleural effusion. No nodules or masses. CTA HEAD FINDINGS ANTERIOR CIRCULATION: --Intracranial internal carotid arteries: Normal. --Anterior cerebral arteries: Normal. Both A1 segments are present. Patent anterior communicating artery. --Middle cerebral arteries: Normal. --Posterior communicating arteries: Absent bilaterally. POSTERIOR CIRCULATION: --Basilar artery: Normal. --Posterior cerebral arteries: Normal. --Superior cerebellar arteries: Normal. --Inferior cerebellar arteries: Normal  anterior and posterior inferior  cerebellar arteries. VENOUS SINUSES: As permitted by contrast timing, patent. ANATOMIC VARIANTS: None DELAYED PHASE: Not performed. Review of the MIP images confirms the above findings. IMPRESSION: 1. No emergent large vessel occlusion or hemodynamically significant stenosis within the head or neck. 2. Left frontal convexity dural thickening at the site old subdural hematoma. 3. No acute hemorrhage.  ASPECTS = 10. 4. Mild aortic atherosclerosis (ICD10-I70.0). Electronically Signed   By: Ulyses Jarred M.D.   On: 08/04/2017 15:45    Assessment and plan discussed with with attending physician and they are in agreement.    Laurey Morale, MSN, NP-C [facility] [phone]  08/04/2017, 5:15 PM    NEUROHOSPITALIST ADDENDUM Seen and examined the patient today. I have reviewed the contents of history and physical exam as documented by PA/ARNP/Resident and agree with above documentation.  I have discussed and formulated the above plan as documented. Edits to the note have been made as needed.    Karena Addison Maizie Garno MD [facility] [phone]   If 7pm to 7am, please call on call as listed on AMION.   Assessment: 72 y.o. Young past medical history of chronic headaches, chronic right leg weakness presents to the ED after noted to have garbled speech and being incomprehensible to the interpreter during his urology office visit.  He started his stated that he did have a headache during the visit.  Patient was transferred to [facility] ED where there was concern of right facial droop and possible right arm weakness. On assessment, patient did not have focal weakness, no obvious facial droop noted. Patient was alert, however interpreter felt patient was mumbling and repeating sentences. He was following simple commands.    Impression Encephalopathy Possible stroke Chronic headache  Plan MRI brain w/o contrast.  If negative, suspect presentation is  due to headache/complicated migraine vs metabolic cause for encephalopathy Check UA, CBC, BMP.

## 2017-08-04 NOTE — ED Notes (Signed)
Patient transported to CT 

## 2017-08-05 ENCOUNTER — Telehealth: Payer: Self-pay

## 2017-08-05 NOTE — Telephone Encounter (Signed)
CSW called DSS to complete APS report. CSW started a new report with APS. Completed with [place]. Vincente Liberty took the report.   Wendelyn Breslow, [facility]  [phone]

## 2017-08-05 NOTE — ED Notes (Signed)
Mitzi Hansen, RN reviewed d/c instructions with pt via translator. Pt's documented difficulty communicating continues. PA aware. Pt given d/c papers and departed in NAD, escorted by NT in wheelchair.

## 2017-10-21 ENCOUNTER — Emergency Department (HOSPITAL_COMMUNITY): Payer: Medicare Other

## 2017-10-21 ENCOUNTER — Encounter (HOSPITAL_COMMUNITY): Payer: Self-pay

## 2017-10-21 ENCOUNTER — Other Ambulatory Visit: Payer: Self-pay

## 2017-10-21 ENCOUNTER — Emergency Department (HOSPITAL_COMMUNITY)
Admission: EM | Admit: 2017-10-21 | Discharge: 2017-10-22 | Disposition: A | Discharge status: Home / Self Care | Payer: Medicare Other | Attending: Emergency Medicine | Admitting: Emergency Medicine

## 2017-10-21 DIAGNOSIS — R51 Headache: Secondary | ICD-10-CM | POA: Diagnosis not present

## 2017-10-21 DIAGNOSIS — Z87891 Personal history of nicotine dependence: Secondary | ICD-10-CM | POA: Diagnosis not present

## 2017-10-21 DIAGNOSIS — J029 Acute pharyngitis, unspecified: Secondary | ICD-10-CM | POA: Diagnosis present

## 2017-10-21 DIAGNOSIS — J069 Acute upper respiratory infection, unspecified: Secondary | ICD-10-CM | POA: Diagnosis not present

## 2017-10-21 DIAGNOSIS — Z8546 Personal history of malignant neoplasm of prostate: Secondary | ICD-10-CM | POA: Insufficient documentation

## 2017-10-21 DIAGNOSIS — Z79899 Other long term (current) drug therapy: Secondary | ICD-10-CM | POA: Diagnosis not present

## 2017-10-21 DIAGNOSIS — B9789 Other viral agents as the cause of diseases classified elsewhere: Secondary | ICD-10-CM | POA: Insufficient documentation

## 2017-10-21 LAB — CBC WITH DIFFERENTIAL/PLATELET
BASOS PCT: 1 %
Basophils Absolute: 0.1 10*3/uL (ref 0.0–0.1)
EOS ABS: 0.1 10*3/uL (ref 0.0–0.7)
Eosinophils Relative: 1 %
HCT: 37.9 % — ABNORMAL LOW (ref 39.0–52.0)
HEMOGLOBIN: 12.8 g/dL — AB (ref 13.0–17.0)
LYMPHS ABS: 1.1 10*3/uL (ref 0.7–4.0)
Lymphocytes Relative: 26 %
MCH: 32.7 pg (ref 26.0–34.0)
MCHC: 33.8 g/dL (ref 30.0–36.0)
MCV: 96.7 fL (ref 78.0–100.0)
MONO ABS: 0.3 10*3/uL (ref 0.1–1.0)
MONOS PCT: 8 %
NEUTROS PCT: 64 %
Neutro Abs: 2.7 10*3/uL (ref 1.7–7.7)
Platelets: 248 10*3/uL (ref 150–400)
RBC: 3.92 MIL/uL — ABNORMAL LOW (ref 4.22–5.81)
RDW: 12.4 % (ref 11.5–15.5)
WBC: 4.2 10*3/uL (ref 4.0–10.5)

## 2017-10-21 LAB — BASIC METABOLIC PANEL
Anion gap: 11 (ref 5–15)
BUN: 19 mg/dL (ref 8–23)
CALCIUM: 9.7 mg/dL (ref 8.9–10.3)
CHLORIDE: 108 mmol/L (ref 98–111)
CO2: 26 mmol/L (ref 22–32)
CREATININE: 1.25 mg/dL — AB (ref 0.61–1.24)
GFR calc non Af Amer: 57 mL/min — ABNORMAL LOW (ref >60)
Glucose, Bld: 90 mg/dL (ref 70–99)
Potassium: 4.5 mmol/L (ref 3.5–5.1)
SODIUM: 145 mmol/L (ref 135–145)

## 2017-10-21 LAB — I-STAT TROPONIN, ED: TROPONIN I, POC: 0 ng/mL (ref 0.00–0.08)

## 2017-10-21 LAB — GROUP A STREP BY PCR: Group A Strep by PCR: NOT DETECTED

## 2017-10-21 MED ORDER — GUAIFENESIN ER 600 MG PO TB12: 600.0000 mg | ORAL_TABLET | Freq: Two times a day (BID) | ORAL | 0 refills | Status: DC

## 2017-10-21 MED ORDER — ACETAMINOPHEN 325 MG PO TABS
650.0000 mg | ORAL_TABLET | Freq: Once | ORAL | Status: AC
  Administered 2017-10-21: 650 mg via ORAL
  Filled 2017-10-21: qty 2

## 2017-10-21 NOTE — ED Notes (Signed)
Pt asking registration for assistance but due to language barrier, he is unable to express them. Pt pulled into triage area and translator used.Pt states he is having a hard time talking because his tongue and throat hurt. Translator states he speaks a different dialect.
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
## 2017-10-21 NOTE — ED Triage Notes (Addendum)
Translator [place], [place] used. Pt very difficult to understand per translator, as per normal. PT c/o pain in his throat for 5 months. Pt brought in by [facility], but no report received. Pt's address is [address] , [place], [place], [place] [address]. Daughter's numbers [phone]

## 2017-10-21 NOTE — Discharge Instructions (Addendum)
You were given a prescription for Mucinex to help with your chest congestion.  Please take this as directed.  You may take 1 tablet twice daily. Please follow-up with your primary care doctor in 1 week for reevaluation return to the ER for any new or worsening symptoms including fevers, worsening cough, chest pain, worsening shortness of breath or any new or worsening symptoms.  B?n ? ???c k toa thu?c Mucinex ?? gip ?? t?c ngh?n ng?c. Hy th?c hi?n theo h??ng d?n. B?n c th? u?ng 1 vin hai l?n m?i ngy. Vui lng theo di v?i bc s? ch?m [place] chnh c?a b?n sau 1 tu?n ?? ?nh gi l?i tr? l?i ER cho b?t k? tri?u ch?ng m?i ho?c x?u ?i bao g?m s?t, ho n?ng h?n, ?au ng?c, kh th? n?ng h?n ho?c b?t k? tri?u ch?ng m?i ho?c x?u ?i.

## 2017-10-21 NOTE — ED Notes (Signed)
This RN attempted to introduce self to pt. Pt just kept stating "hungry"

## 2017-10-22 ENCOUNTER — Emergency Department (HOSPITAL_COMMUNITY): Payer: Medicare Other

## 2017-10-22 DIAGNOSIS — J069 Acute upper respiratory infection, unspecified: Secondary | ICD-10-CM | POA: Diagnosis not present

## 2017-10-22 NOTE — ED Notes (Signed)
Patient transported to CT 

## 2017-10-22 NOTE — ED Notes (Signed)
Pt back from CT

## 2017-10-22 NOTE — ED Notes (Addendum)
[facility] has been contacted 

## 2017-10-22 NOTE — ED Provider Notes (Signed)
Patient here with viral URI who admits to frequent falls.  He presents today primarily for URI, according to previous provider they were going to discharge patient and treat him with Mucinex, however he mentioned that he has fallen multiple times and hit his head.    Plan is to follow-up on CT scan head and neck, if normal then home.   Physical Exam  BP 126/77 (BP Location: Left Arm)   Pulse 61   Temp 97.7 F (36.5 C) (Rectal)   Resp 16   Wt 56.7 kg   SpO2 100%   BMI 24.41 kg/m   Physical Exam  Constitutional:  Non-toxic appearance. No distress.  HENT:  Head: Normocephalic and atraumatic.  Eyes: Conjunctivae are normal. Right eye exhibits no discharge. Left eye exhibits no discharge. No scleral icterus.  Neck: Normal range of motion.  Cardiovascular: Normal rate and regular rhythm.  Pulmonary/Chest: Effort normal. No stridor. No respiratory distress.  Abdominal: He exhibits no distension.  Musculoskeletal: He exhibits no edema or deformity.  Neurological: He is alert. He exhibits normal muscle tone.  Skin: Skin is warm. He is not diaphoretic.  Psychiatric: He has a normal mood and affect. His behavior is normal.  Nursing note and vitals reviewed.   ED Course/Procedures     Procedures  MDM  Plan per previous team.  CT head and neck were obtained without evidence of acute abnormalities.  Patient was discharged home with previously discussed upon plan and instructions.       Lorin Glass, PA-C 10/22/17 [id number]    Sherwood Gambler, MD 10/23/17 [phone]

## 2017-11-01 ENCOUNTER — Emergency Department (HOSPITAL_COMMUNITY): Payer: Medicare Other

## 2017-11-01 ENCOUNTER — Emergency Department (HOSPITAL_COMMUNITY)
Admission: EM | Admit: 2017-11-01 | Discharge: 2017-11-01 | Disposition: A | Discharge status: Home / Self Care | Payer: Medicare Other | Attending: Emergency Medicine | Admitting: Emergency Medicine

## 2017-11-01 ENCOUNTER — Encounter (HOSPITAL_COMMUNITY): Payer: Self-pay

## 2017-11-01 DIAGNOSIS — J029 Acute pharyngitis, unspecified: Secondary | ICD-10-CM | POA: Diagnosis not present

## 2017-11-01 DIAGNOSIS — Z8546 Personal history of malignant neoplasm of prostate: Secondary | ICD-10-CM | POA: Diagnosis not present

## 2017-11-01 DIAGNOSIS — M25552 Pain in left hip: Secondary | ICD-10-CM | POA: Diagnosis not present

## 2017-11-01 DIAGNOSIS — Z87891 Personal history of nicotine dependence: Secondary | ICD-10-CM | POA: Diagnosis not present

## 2017-11-01 DIAGNOSIS — F0390 Unspecified dementia without behavioral disturbance: Secondary | ICD-10-CM | POA: Diagnosis not present

## 2017-11-01 MED ORDER — ACETAMINOPHEN 325 MG PO TABS: 650.0000 mg | ORAL_TABLET | Freq: Once | ORAL | Status: DC
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
## 2017-11-01 NOTE — ED Notes (Signed)
Pt did speak with pharmacy tech using [place] interpretor- states is here for follow up on sore throat.

## 2017-11-01 NOTE — ED Triage Notes (Signed)
Pt presents via POV. Patient is refusing to speak. Per nurse in waiting room, pt stumbled into waiting room and provided ID to check in. [facility] on 8/8 for throat pain. Pt with healing scabs to bilateral knees and elbows that he points to.

## 2017-11-01 NOTE — Progress Notes (Addendum)
CSW attempted to contact pt's daughter at [phone]. Per last visit notes, pt staying at [address], [place], [place].   CSW will continue to reach out to daughter.   6:35 PM CSW spoke with pt's daughter. Pt stays with daughter. Pt's daughter stated pt can return and he has a key to get into apartment. Pt's daughter confirmed address, [address], [address] [place]. Pt walks with cane and has walked safely while in the ED. CSW provided taxi voucher.   Wendelyn Breslow, [facility]  [phone]
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
## 2017-11-18 ENCOUNTER — Encounter: Payer: Self-pay | Admitting: Family Medicine

## 2017-11-18 ENCOUNTER — Ambulatory Visit (INDEPENDENT_AMBULATORY_CARE_PROVIDER_SITE_OTHER): Payer: Medicare Other | Admitting: Family Medicine

## 2017-11-18 VITALS — BP 110/80 | HR 78 | Temp 97.8°F | Wt 118.6 lb

## 2017-11-18 DIAGNOSIS — Z0000 Encounter for general adult medical examination without abnormal findings: Secondary | ICD-10-CM

## 2017-11-18 DIAGNOSIS — D649 Anemia, unspecified: Secondary | ICD-10-CM | POA: Diagnosis not present

## 2017-11-18 DIAGNOSIS — R300 Dysuria: Secondary | ICD-10-CM | POA: Diagnosis not present

## 2017-11-18 DIAGNOSIS — Z1159 Encounter for screening for other viral diseases: Secondary | ICD-10-CM

## 2017-11-18 LAB — POCT URINALYSIS DIP (MANUAL ENTRY)
Bilirubin, UA: NEGATIVE
Glucose, UA: NEGATIVE mg/dL
Ketones, POC UA: NEGATIVE mg/dL
Leukocytes, UA: NEGATIVE
Nitrite, UA: NEGATIVE
PROTEIN UA: NEGATIVE mg/dL
RBC UA: NEGATIVE
Spec Grav, UA: 1.01 (ref 1.010–1.025)
UROBILINOGEN UA: 0.2 U/dL
pH, UA: 6.5 (ref 5.0–8.0)

## 2017-11-18 NOTE — Assessment & Plan Note (Signed)
U/A obtained and negative for infection or blood. May have some urinary retention given suprapubic tenderness, h/o BPH, and difficulty obtaining specimen during visit today. Reports urinating an hour ago with frequent urination at home so less concern for acute urinary retention or obstruction. Will obtain BMP and attempt to contact daughter regarding history. May benefit from flomax.

## 2017-11-18 NOTE — Patient Instructions (Signed)
Ti r?t t?t ?? xem b?n!  K? ho?ch c?a chng ti cho ngy hm nay: -Ti s? g?i cho con gi c?a b?n ?? c thm thng tin v? l?ch s? c?a b?n.  -Chng ti ?ang l?y phng th nghi?m ngy hm nay, chng ti s? g?i cho b?n v?i nh?ng k?t qu?Marland Kitchen  Xin vui lng g?i ho?c ??n phng khm n?u b?n c?n b?t c? ?i?u g khc.  ???c t?t,  Ti?n s? [name]

## 2017-11-19 ENCOUNTER — Encounter: Payer: Self-pay | Admitting: *Deleted

## 2017-11-19 LAB — BASIC METABOLIC PANEL
BUN / CREAT RATIO: 12 (ref 10–24)
BUN: 15 mg/dL (ref 8–27)
CALCIUM: 9.9 mg/dL (ref 8.6–10.2)
CO2: 25 mmol/L (ref 20–29)
Chloride: 104 mmol/L (ref 96–106)
Creatinine, Ser: 1.27 mg/dL (ref 0.76–1.27)
GFR, EST AFRICAN AMERICAN: 66 mL/min/{1.73_m2} (ref >59)
GFR, EST NON AFRICAN AMERICAN: 57 mL/min/{1.73_m2} — AB (ref >59)
Glucose: 94 mg/dL (ref 65–99)
POTASSIUM: 5 mmol/L (ref 3.5–5.2)
Sodium: 144 mmol/L (ref 134–144)

## 2017-11-19 LAB — CBC
Hematocrit: 37.3 % — ABNORMAL LOW (ref 37.5–51.0)
Hemoglobin: 12.8 g/dL — ABNORMAL LOW (ref 13.0–17.7)
MCH: 33.3 pg — AB (ref 26.6–33.0)
MCHC: 34.3 g/dL (ref 31.5–35.7)
MCV: 97 fL (ref 79–97)
PLATELETS: 270 10*3/uL (ref 150–450)
RBC: 3.84 x10E6/uL — AB (ref 4.14–5.80)
RDW: 13.1 % (ref 12.3–15.4)
WBC: 5.2 10*3/uL (ref 3.4–10.8)

## 2017-11-19 LAB — HEPATITIS C ANTIBODY (REFLEX): HCV Ab: 0.1 s/co ratio (ref 0.0–0.9)

## 2017-11-19 LAB — HCV COMMENT:

## 2018-01-20 ENCOUNTER — Encounter (HOSPITAL_COMMUNITY): Payer: Self-pay | Admitting: Emergency Medicine

## 2018-01-20 ENCOUNTER — Emergency Department (HOSPITAL_COMMUNITY)
Admission: EM | Admit: 2018-01-20 | Discharge: 2018-01-20 | Disposition: A | Discharge status: Home / Self Care | Payer: Medicare Other | Attending: Emergency Medicine | Admitting: Emergency Medicine

## 2018-01-20 ENCOUNTER — Emergency Department (HOSPITAL_COMMUNITY): Payer: Medicare Other

## 2018-01-20 ENCOUNTER — Other Ambulatory Visit: Payer: Self-pay

## 2018-01-20 DIAGNOSIS — Z87891 Personal history of nicotine dependence: Secondary | ICD-10-CM | POA: Insufficient documentation

## 2018-01-20 DIAGNOSIS — F0390 Unspecified dementia without behavioral disturbance: Secondary | ICD-10-CM | POA: Diagnosis not present

## 2018-01-20 DIAGNOSIS — R51 Headache: Secondary | ICD-10-CM | POA: Diagnosis present

## 2018-01-20 DIAGNOSIS — R2681 Unsteadiness on feet: Secondary | ICD-10-CM | POA: Diagnosis not present

## 2018-01-20 DIAGNOSIS — N179 Acute kidney failure, unspecified: Secondary | ICD-10-CM

## 2018-01-20 DIAGNOSIS — Y939 Activity, unspecified: Secondary | ICD-10-CM | POA: Insufficient documentation

## 2018-01-20 DIAGNOSIS — Y999 Unspecified external cause status: Secondary | ICD-10-CM | POA: Insufficient documentation

## 2018-01-20 DIAGNOSIS — W19XXXA Unspecified fall, initial encounter: Secondary | ICD-10-CM

## 2018-01-20 DIAGNOSIS — Y929 Unspecified place or not applicable: Secondary | ICD-10-CM | POA: Diagnosis not present

## 2018-01-20 DIAGNOSIS — S0003XA Contusion of scalp, initial encounter: Secondary | ICD-10-CM | POA: Diagnosis not present

## 2018-01-20 DIAGNOSIS — E860 Dehydration: Secondary | ICD-10-CM

## 2018-01-20 DIAGNOSIS — Y92009 Unspecified place in unspecified non-institutional (private) residence as the place of occurrence of the external cause: Secondary | ICD-10-CM

## 2018-01-20 LAB — COMPREHENSIVE METABOLIC PANEL
ALT: 29 U/L (ref 0–44)
AST: 31 U/L (ref 15–41)
Albumin: 3.9 g/dL (ref 3.5–5.0)
Alkaline Phosphatase: 78 U/L (ref 38–126)
Anion gap: 9 (ref 5–15)
BUN: 23 mg/dL (ref 8–23)
CHLORIDE: 108 mmol/L (ref 98–111)
CO2: 22 mmol/L (ref 22–32)
CREATININE: 1.71 mg/dL — AB (ref 0.61–1.24)
Calcium: 9.1 mg/dL (ref 8.9–10.3)
GFR, EST AFRICAN AMERICAN: 45 mL/min — AB (ref >60)
GFR, EST NON AFRICAN AMERICAN: 39 mL/min — AB (ref >60)
Glucose, Bld: 88 mg/dL (ref 70–99)
Potassium: 4.6 mmol/L (ref 3.5–5.1)
Sodium: 139 mmol/L (ref 135–145)
Total Bilirubin: 0.9 mg/dL (ref 0.3–1.2)
Total Protein: 7 g/dL (ref 6.5–8.1)

## 2018-01-20 LAB — CBC
HCT: 38.5 % — ABNORMAL LOW (ref 39.0–52.0)
Hemoglobin: 12.4 g/dL — ABNORMAL LOW (ref 13.0–17.0)
MCH: 32.5 pg (ref 26.0–34.0)
MCHC: 32.2 g/dL (ref 30.0–36.0)
MCV: 100.8 fL — AB (ref 80.0–100.0)
NRBC: 0 % (ref 0.0–0.2)
PLATELETS: 249 10*3/uL (ref 150–400)
RBC: 3.82 MIL/uL — ABNORMAL LOW (ref 4.22–5.81)
RDW: 12 % (ref 11.5–15.5)
WBC: 4.7 10*3/uL (ref 4.0–10.5)

## 2018-01-20 LAB — MAGNESIUM: Magnesium: 2.2 mg/dL (ref 1.7–2.4)

## 2018-01-20 MED ORDER — SODIUM CHLORIDE 0.9 % IV BOLUS
1000.0000 mL | Freq: Once | INTRAVENOUS | Status: AC
  Administered 2018-01-20: 1000 mL via INTRAVENOUS

## 2018-01-20 NOTE — ED Notes (Signed)
Call to pt home phone number to attempt to alert family that pt is up for discharge.  Also called pt daughter from his cell phone and left her a message.  Pt given breakfast tray which he is eating.  PA who is a native [place] speaker spoke with pt and advised me that pt is not making a lot of sense, he feels that pt is demented.  Pt is able to communicate that his daughter is at work but not able to advise who else could pick him up.

## 2018-01-20 NOTE — ED Notes (Signed)
[name]-SR(Breakfast Ordered)-per RN-called by Levada Dy

## 2018-01-20 NOTE — ED Notes (Signed)
Pt daughter wants to be called when pt is ready to be DC  [phone] [place] ([place])

## 2018-01-20 NOTE — ED Triage Notes (Signed)
Pt daughter brings pt in d/t increase in falls and hitting his head after a fall yesterday. States pt is increasingly weak and she is unable to care for him at home and is requesting social work consult for assistance.

## 2018-01-20 NOTE — ED Notes (Signed)
Pt given sandwich 

## 2018-01-20 NOTE — Progress Notes (Signed)
Please advise social work at ph: [phone] if pt's daughter does not arrive to p/u the pt.  CSW will continue to follow for D/C needs.  Alphonse Guild. Ellias Mcelreath, LCSW, LCAS, CSI Clinical Social Worker Ph: [phone]

## 2018-01-20 NOTE — Care Management (Signed)
ED CM attempted to reach patient's daughter by phone no answer LVM to contact the [facility] ED concerning patient pick up.  This is the 3rd call made, ED CM contact [employer] for a wellness check.

## 2018-01-20 NOTE — Discharge Planning (Signed)
Senai Kingsley J. Clydene Laming, RN, BSN, [employer] [phone] Spoke with pt at bedside regarding discharge planning for [facility]. Offered pt list of home health agencies to choose from.  Pt chose [facility] to render services. Glyn Ade of [facility] notified. Patient made aware that [facility] will be in contact in 24-48 hours.  No DME needs identified at this time.

## 2018-01-20 NOTE — ED Notes (Signed)
Spoke with pt daughter who states she works until 7:30pm today. Pt daughter asked if we could send pt home via ambulance. Notified her that I would look into this option. Told daughter we would need her to come get pt when she gets off work. Pt daughter voiced understanding.
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
## 2018-01-20 NOTE — Progress Notes (Signed)
CSW received a call from pt's RN CM stating pt's daughter has not picked up the pt.  RN CM stated RN CM has also called the family and RN CM has requested [employer] to complete a safety check at the pt's home, as well.  Per RN CM, RN CM is awaiting a call back from [employer].  CSW will continue to follow for D/C needs.  [name]. Deiondre Harrower, LCSW, LCAS, CSI Clinical Social Worker Ph: [phone]

## 2018-01-20 NOTE — Discharge Instructions (Signed)
Our care management team will call you (daughter) to help arrange home care.

## 2018-01-20 NOTE — ED Notes (Signed)
Attempted to call pt's daughter with no response.

## 2018-01-20 NOTE — ED Notes (Signed)
Left 2nd voicemail for daughter, Threasa Alpha, that pt was ready to be discharged

## 2018-01-20 NOTE — Progress Notes (Signed)
2nd shift ED CSW received a handoff from the 1st shift [facility] ED CSW.   CSW called pt's daughter Cassidy Tabet at ph: [phone] to confirm pt's being p/u by the pt's daughter and to confirm the time.  CSW left a HIPPA-compliant VM requesting a call back.  CSW will continue to follow for D/C needs.  Alphonse Guild. Lavinia Mcneely, LCSW, LCAS, CSI Clinical Social Worker Ph: [phone]

## 2018-01-20 NOTE — ED Notes (Signed)
Left voicemail #3 for daughter, Threasa Alpha, that her father is being discharged and ready to be picked up

## 2018-01-20 NOTE — ED Notes (Signed)
Left VM for pt daughter that pt is here waiting to be discharged. Left callback number for pt daughter.

## 2018-01-20 NOTE — Progress Notes (Addendum)
CSW received phone call from [facility] who reported that staff was having difficulty getting in contact with patient's family. Per notes, patient's daughter appears to be actively involved in patient's care and had requested to be notified when patient was ready for discharge. CSW aware staff has been attempting to reach out to daughter since early this morning and have left voicemails for return calls but have not received them. CSW also reached out and left daughter a voicemail for return call. CSW will continue to try and get in contact with daughter.  1:54pm- CSW received return call from patient's daughter, [name], who stated she would be by to pick up her father "later". CSW attempted to gauge what time she would be here for patient to which she just kept saying that she already told someone when she'd be here. CSW spoke with patient's RN who states that the daughter told her she would be by sometime after 7:30pm.   [name], [facility]  [phone]

## 2018-01-20 NOTE — ED Notes (Signed)
Overheard pt talking on his cell phone (pt had phone on speaker phone) with his daughter who states she would come and see him tomorrow.

## 2018-01-20 NOTE — ED Notes (Signed)
Pt given clothing.  Pt dresses self and ambulated to the bathroom to void with NT at his side, no distress at this time. Breakfast ordered for pt

## 2018-01-20 NOTE — ED Notes (Signed)
Patient transported to CT 

## 2018-01-27 ENCOUNTER — Telehealth: Payer: Self-pay | Admitting: Family Medicine

## 2018-01-27 ENCOUNTER — Telehealth: Payer: Self-pay | Admitting: *Deleted

## 2018-01-27 NOTE — Telephone Encounter (Signed)
LVM for Ana to call back, wasn't sure if VM is secure.

## 2018-01-27 NOTE — Telephone Encounter (Signed)
Sharyn Lull the Education officer, museum for [employer] called.    Daughter is interested in placing father in a SNF.  Sharyn Lull is requesting that we complete a FL2 form.   You can contact her with questions @ [phone]. [place], Salome Spotted, CMA

## 2018-01-27 NOTE — Telephone Encounter (Signed)
Ok to give verbal orders. Thanks! 

## 2018-01-27 NOTE — Telephone Encounter (Signed)
Attempted to call again.  No answer.

## 2018-01-27 NOTE — Telephone Encounter (Signed)
Ana from [facility] is calling to get verbal orders for physical therapy.  Balance and gait training 2 x a week for 4 weeks.   They are looking into possible placement for pt due to his safety with his dementia.   The best call back number for Wilhemena Durie is [phone].

## 2018-01-27 NOTE — Telephone Encounter (Signed)
I believe this is reasonable given my previous exam and interactions with the patient. Will fill out FL2 for patient.

## 2018-01-28 NOTE — Telephone Encounter (Signed)
Left message on social worker VM for call back.  My question for her: Does she think SNF placement is appropriate for the patient or would he benefit more from long term care facility such as ICF? Or if the daughter has specific concerns she has elicited to her?

## 2018-01-28 NOTE — Telephone Encounter (Signed)
Attempted to call number provided and it never rang.  Will continue to try and reach.  Jailan Trimm,CMA

## 2018-01-28 NOTE — Telephone Encounter (Signed)
Verbal orders given to [facility].  [facility]

## 2018-02-01 NOTE — Telephone Encounter (Signed)
[name], Education officer, museum, returning your call. She was out of the office yesterday.  Call back [phone]  Danley Danker, RN [facility])

## 2018-02-04 NOTE — Telephone Encounter (Signed)
Spoke with Education officer, museum. Believe he is appropriate for long term memory care unit.   Completed FL2 and ready for pickup by daughter at clinic. Will also fax copy to social worker, Sharyn Lull, and scan copy into his chart.  Rory Percy, DO PGY-2, [facility] 02/04/2018 3:37 PM

## 2018-02-27 NOTE — Progress Notes (Unsigned)
  Subjective:   Patient ID: [name]    DOB: 02/25/1946, 72 y.o. male   MRN: [id number]  [name] is a 72 y.o. male with a history of dementia, BPH, h/o prostate cancer, HLD here for   FL2 paperwork - ***  Review of Systems:  Per HPI.  [place], medications and smoking status reviewed.  Objective:   There were no vitals taken for this visit. Vitals and nursing note reviewed.  General: well nourished, well developed, in no acute distress with non-toxic appearance HEENT: normocephalic, atraumatic, moist mucous membranes Neck: supple, non-tender without lymphadenopathy CV: regular rate and rhythm without murmurs, rubs, or gallops, no lower extremity edema Lungs: clear to auscultation bilaterally with normal work of breathing Abdomen: soft, non-tender, non-distended, no masses or organomegaly palpable, normoactive bowel sounds Skin: warm, dry, no rashes or lesions Extremities: warm and well perfused, normal tone MSK: ROM grossly intact, strength intact, gait normal Neuro: Alert and oriented, speech normal  Assessment & Plan:   No problem-specific Assessment & Plan notes found for this encounter.  No orders of the defined types were placed in this encounter.  No orders of the defined types were placed in this encounter.   Rory Percy, DO PGY-2, [facility] 02/27/2018 9:17 PM

## 2018-02-28 ENCOUNTER — Ambulatory Visit: Payer: Medicare Other | Admitting: Family Medicine

## 2018-04-30 ENCOUNTER — Encounter (HOSPITAL_COMMUNITY): Payer: Self-pay | Admitting: *Deleted

## 2018-04-30 ENCOUNTER — Emergency Department (HOSPITAL_COMMUNITY): Payer: Medicare Other

## 2018-04-30 ENCOUNTER — Other Ambulatory Visit: Payer: Self-pay

## 2018-04-30 ENCOUNTER — Emergency Department (HOSPITAL_COMMUNITY)
Admission: EM | Admit: 2018-04-30 | Discharge: 2018-05-01 | Disposition: A | Discharge status: Home / Self Care | Payer: Medicare Other | Attending: Emergency Medicine | Admitting: Emergency Medicine

## 2018-04-30 DIAGNOSIS — F0390 Unspecified dementia without behavioral disturbance: Secondary | ICD-10-CM | POA: Diagnosis not present

## 2018-04-30 DIAGNOSIS — M6281 Muscle weakness (generalized): Secondary | ICD-10-CM | POA: Insufficient documentation

## 2018-04-30 DIAGNOSIS — R059 Cough, unspecified: Secondary | ICD-10-CM

## 2018-04-30 DIAGNOSIS — R05 Cough: Secondary | ICD-10-CM

## 2018-04-30 DIAGNOSIS — R296 Repeated falls: Secondary | ICD-10-CM | POA: Insufficient documentation

## 2018-04-30 DIAGNOSIS — Z87891 Personal history of nicotine dependence: Secondary | ICD-10-CM | POA: Insufficient documentation

## 2018-04-30 DIAGNOSIS — R042 Hemoptysis: Secondary | ICD-10-CM

## 2018-04-30 LAB — URINALYSIS, ROUTINE W REFLEX MICROSCOPIC
Bilirubin Urine: NEGATIVE
Glucose, UA: NEGATIVE mg/dL
Hgb urine dipstick: NEGATIVE
Ketones, ur: NEGATIVE mg/dL
Leukocytes,Ua: NEGATIVE
Nitrite: NEGATIVE
Protein, ur: NEGATIVE mg/dL
SPECIFIC GRAVITY, URINE: 1.008 (ref 1.005–1.030)
pH: 6 (ref 5.0–8.0)

## 2018-04-30 LAB — CBC WITH DIFFERENTIAL/PLATELET
ABS IMMATURE GRANULOCYTES: 0.03 10*3/uL (ref 0.00–0.07)
Basophils Absolute: 0.1 10*3/uL (ref 0.0–0.1)
Basophils Relative: 1 %
Eosinophils Absolute: 0.1 10*3/uL (ref 0.0–0.5)
Eosinophils Relative: 2 %
HCT: 34.3 % — ABNORMAL LOW (ref 39.0–52.0)
HEMOGLOBIN: 11.5 g/dL — AB (ref 13.0–17.0)
Immature Granulocytes: 1 %
Lymphocytes Relative: 32 %
Lymphs Abs: 1.5 10*3/uL (ref 0.7–4.0)
MCH: 33 pg (ref 26.0–34.0)
MCHC: 33.5 g/dL (ref 30.0–36.0)
MCV: 98.6 fL (ref 80.0–100.0)
MONO ABS: 0.4 10*3/uL (ref 0.1–1.0)
Monocytes Relative: 8 %
Neutro Abs: 2.6 10*3/uL (ref 1.7–7.7)
Neutrophils Relative %: 56 %
Platelets: 244 10*3/uL (ref 150–400)
RBC: 3.48 MIL/uL — ABNORMAL LOW (ref 4.22–5.81)
RDW: 11.6 % (ref 11.5–15.5)
WBC: 4.6 10*3/uL (ref 4.0–10.5)
nRBC: 0 % (ref 0.0–0.2)

## 2018-04-30 LAB — COMPREHENSIVE METABOLIC PANEL
ALT: 19 U/L (ref 0–44)
AST: 24 U/L (ref 15–41)
Albumin: 3.7 g/dL (ref 3.5–5.0)
Alkaline Phosphatase: 55 U/L (ref 38–126)
Anion gap: 7 (ref 5–15)
BUN: 16 mg/dL (ref 8–23)
CO2: 21 mmol/L — ABNORMAL LOW (ref 22–32)
Calcium: 8.8 mg/dL — ABNORMAL LOW (ref 8.9–10.3)
Chloride: 110 mmol/L (ref 98–111)
Creatinine, Ser: 1.3 mg/dL — ABNORMAL HIGH (ref 0.61–1.24)
GFR calc Af Amer: >60 mL/min (ref >60)
GFR, EST NON AFRICAN AMERICAN: 55 mL/min — AB (ref >60)
Glucose, Bld: 92 mg/dL (ref 70–99)
Potassium: 3.9 mmol/L (ref 3.5–5.1)
Sodium: 138 mmol/L (ref 135–145)
Total Bilirubin: 0.6 mg/dL (ref 0.3–1.2)
Total Protein: 6.7 g/dL (ref 6.5–8.1)

## 2018-04-30 LAB — LACTIC ACID, PLASMA: Lactic Acid, Venous: 1.2 mmol/L (ref 0.5–1.9)

## 2018-04-30 LAB — INFLUENZA PANEL BY PCR (TYPE A & B)
Influenza A By PCR: NEGATIVE
Influenza B By PCR: NEGATIVE

## 2018-04-30 MED ORDER — SODIUM CHLORIDE 0.9 % IV BOLUS
1000.0000 mL | Freq: Once | INTRAVENOUS | Status: AC
  Administered 2018-04-30: 1000 mL via INTRAVENOUS

## 2018-04-30 MED ORDER — SODIUM CHLORIDE 0.9% FLUSH
3.0000 mL | Freq: Once | INTRAVENOUS | Status: AC
  Administered 2018-04-30: 3 mL via INTRAVENOUS

## 2018-04-30 NOTE — ED Notes (Signed)
Pt's daughter did say that he is going to be homeless in "about a month" states that they are losing their apt and she does not know what to do with him.

## 2018-04-30 NOTE — ED Notes (Signed)
Attempted to speak to patient using translator. Translator stated she could not make out much of what the pt was saying but did catch that the pt was having some throat pain, where he pointed to, and was tired. MD aware

## 2018-04-30 NOTE — Discharge Instructions (Signed)
Follow-up with [facility] to establish a primary care doctor if you do not have one.   Return to the Emergency for any fever, difficulty breathing, CP, or any other worsening or concerning symptoms.

## 2018-04-30 NOTE — ED Triage Notes (Signed)
Pt in c/o cough for the last 5 years, worse in the last month, today family noticed blood in his sputum, denies fever at home, family reports increased weakness and falls in the last month

## 2018-04-30 NOTE — ED Provider Notes (Signed)
[facility] DEPARTMENT Provider Note   CSN: [id number] Arrival date & time: 04/30/18  1422     History   Chief Complaint Chief Complaint  Patient presents with  . Cough    HPI [name] is a 73 y.o. male with PMH/o diverticulosis, subdural hematoma, TB who presents for evaluation cough, generalized weakness, cough and hemoptysis.  Daughter states that he has had a chronic cough for the last 5 years.  She states that she noticed the last month, the cough became worse.  She states really in the last week, patient has been having generalized weakness, generalized body aches and worsening cough.  He also reports he has had some congestion and runny nose.  He has not noted any fever.  She states that this week, the cough was productive of green sputum.  She reports that this morning while at home, he had some hemoptysis.  She states that it was "watery blood."  She denies any coffee ground emesis.  She states that for the last week, patient has felt very weak all over.  He denies any focal weakness.  Daughter states that he has been acting appropriately and does not appear ill or confused.  She reports some generalized abdominal pain.  It sounds like he has had a few episodes of post emesis emesis but denies any vomiting.  He states he has not had any appetite.  Daughter is unsure if he got a flu shot.  Patient states that he feels like the cough is making him have trouble breathing. Daughter states patient has a history of subdural hematoma from falling. She does note that he has had some frequent falls in the last month.  Patient denies any numbness/weakness.  Daughter denies any recent surgeries, hospitalizations, recent travel.  The history is provided by the patient and a relative. No language interpreter was used (Offerred but patient declined).    Past Medical History:  Diagnosis Date  . Adenomatous polyp of colon 06/2010   4 polyps removed at colonoscopy by Dr  Henrene Pastor  . Anxiety   . Chronic headaches   . Depression   . Diverticulosis of colon 08/2010  . Internal hemorrhoids 08/2010  . Prostate cancer [facility])    receiving radiation treatment  . Subdural hematoma, chronic ([place])   . Tuberculosis     Patient Active Problem List   Diagnosis Date Noted  . Dysuria 11/18/2017  . History of colonic polyps 10/01/2015  . Moderate dementia without behavioral disturbance ([place]) 03/22/2015  . Right-sided low back pain with right-sided sciatica 11/30/2014  . Neuropathy 11/30/2014  . Vitamin D insufficiency 07/09/2014  . Right-sided low back pain without sciatica 07/06/2014  . Memory loss 06/24/2014  . Anemia of chronic disease 05/15/2014  . Internal hemorrhoids with complication 81/85/6314  . Degenerative disc disease, lumbar 05/06/2014  . Foraminal stenosis of lumbar region 05/06/2014  . Depression 12/13/2013  . Prostate cancer ([place]) 12/13/2013  . HYPERCHOLESTEROLEMIA, MILD 05/19/2010  . 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 BPH with urinary obstruction 09/23/2009  . HEADACHE 09/23/2009    Past Surgical History:  Procedure Laterality Date  . FLEXIBLE SIGMOIDOSCOPY  03/29/2012   Procedure: FLEXIBLE SIGMOIDOSCOPY;  Surgeon: Inda Castle, MD;  Location: [facility];  Service: Endoscopy;  Laterality: N/A;  possibly may do colonoscopy but prep is only for a flex  . FLEXIBLE SIGMOIDOSCOPY  03/31/2012   Procedure: FLEXIBLE SIGMOIDOSCOPY;  Surgeon: Inda Castle, MD;  Location: [facility];  Service: Endoscopy;  Laterality: N/A;  . HEMORRHOID BANDING  03/31/2012   Procedure: HEMORRHOID BANDING;  Surgeon: Inda Castle, MD;  Location: [facility];  Service: Endoscopy;  Laterality: N/A;  . MULTIPLE EXTRACTIONS WITH ALVEOLOPLASTY N/A 08/08/2013   Procedure: MULTIPLE EXTRACTION OF TEETH #1, 2, 3, 6, 7, 9, 11, 15, 16, 17, 20, 21, 22, 23, 24, 26, 29, 30, 32 WITH ALVEOLOPLASTY AND REMOVAL BUCCAL EXOSTOSIS LEFT MAXILLA;  Surgeon: Gae Bon, DDS;  Location: [place];  Service: Oral Surgery;  Laterality: N/A;  . shrapnel removal     skull during [place]        Home Medications    Prior to Admission medications   Medication Sig Start Date End Date Taking? Authorizing Provider  guaiFENesin (MUCINEX) 600 MG 12 hr tablet Take 1 tablet (600 mg total) by mouth 2 (two) times daily. 10/21/17   Couture, Cortni S, PA-C  UNKNOWN TO PATIENT Medication for Liver    [provider]    Family History History reviewed. No pertinent family history.  Social History Social History   Tobacco Use  . Smoking status: Former Smoker    Packs/day: 0.25    Years: 5.00    Pack years: 1.25    Types: Cigarettes    Last attempt to quit: 03/16/1992    Years since quitting: 26.1  . Smokeless tobacco: Never Used  Substance Use Topics  . Alcohol use: No    Alcohol/week: 0.0 standard drinks  . Drug use: No     Allergies   Patient has no known allergies.   Review of Systems Review of Systems  Constitutional: Positive for appetite change and fatigue. Negative for fever.  Respiratory: Positive for cough. Negative for shortness of breath.   Cardiovascular: Negative for chest pain.  Gastrointestinal: Positive for abdominal pain. Negative for nausea and vomiting.  Genitourinary: Negative for hematuria.  Musculoskeletal: Positive for myalgias.  Neurological: Positive for weakness. Negative for headaches.  All other systems reviewed and are negative.    Physical Exam Updated Vital Signs BP 121/70   Pulse 77   Temp 98.3 F (36.8 C) (Oral)   Resp 16   Ht 5\' 5"  (1.651 m)   Wt 56.7 kg   SpO2 99%   BMI 20.80 kg/m   Physical Exam Vitals signs and nursing note reviewed.  Constitutional:      Comments: Elderly and frail appearing  HENT:     Head: Normocephalic and atraumatic.  Eyes:     General: Lids are normal.     Extraocular Movements:     Right eye: Normal extraocular motion.     Left eye: Normal extraocular motion.      Conjunctiva/sclera: Conjunctivae normal.     Pupils: Pupils are equal, round, and reactive to light.     Comments: PERRL  Neck:     Musculoskeletal: Full passive range of motion without pain.  Cardiovascular:     Rate and Rhythm: Normal rate and regular rhythm.     Pulses: Normal pulses.          Radial pulses are 2+ on the right side and 2+ on the left side.       Dorsalis pedis pulses are 2+ on the right side and 2+ on the left side.     Heart sounds: Normal heart sounds. No murmur. No friction rub. No gallop.   Pulmonary:     Effort: Pulmonary effort is normal.     Breath sounds: Normal breath sounds.  Comments: Lungs clear to auscultation bilaterally.  Symmetric chest rise.  No wheezing, rales, rhonchi. Abdominal:     Palpations: Abdomen is soft. Abdomen is not rigid.     Tenderness: There is generalized abdominal tenderness. There is no guarding.     Comments: Abdomen is soft, non-distended. Generalized tenderness with no focal point.   Musculoskeletal: Normal range of motion.  Skin:    General: Skin is warm and dry.     Capillary Refill: Capillary refill takes less than 2 seconds.  Neurological:     Mental Status: He is alert and oriented to person, place, and time.     Comments: Cranial nerves III-XII intact Follows commands, Moves all extremities  5/5 strength to BUE and BLE  Sensation intact throughout all major nerve distributions Normal coordination No slurred speech. No facial droop.   Psychiatric:        Speech: Speech normal.      ED Treatments / Results  Labs (all labs ordered are listed, but only abnormal results are displayed) Labs Reviewed  COMPREHENSIVE METABOLIC PANEL - Abnormal; Notable for the following components:      Result Value   CO2 21 (*)    Creatinine, Ser 1.30 (*)    Calcium 8.8 (*)    GFR calc non Af Amer 55 (*)    All other components within normal limits  CBC WITH DIFFERENTIAL/PLATELET - Abnormal; Notable for the following  components:   RBC 3.48 (*)    Hemoglobin 11.5 (*)    HCT 34.3 (*)    All other components within normal limits  URINALYSIS, ROUTINE W REFLEX MICROSCOPIC - Abnormal; Notable for the following components:   Color, Urine STRAW (*)    All other components within normal limits  LACTIC ACID, PLASMA  INFLUENZA PANEL BY PCR (TYPE A & B)    EKG None  Radiology Ct Head Wo Contrast  Result Date: 04/30/2018 CLINICAL DATA:  Progressively worsening generalized weakness over the past month with multiple falls. EXAM: CT HEAD WITHOUT CONTRAST TECHNIQUE: Contiguous axial images were obtained from the base of the skull through the vertex without intravenous contrast. COMPARISON:  01/20/2018 and earlier. FINDINGS: Brain: Mild-to-moderate age-related atrophy, unchanged. Moderate changes of small vessel disease of the white matter, unchanged. Focal dural thickening involving the LEFT frontal region superiorly, unchanged. No mass lesion. No midline shift. No acute hemorrhage or hematoma. No extra-axial fluid collections. No evidence of acute infarction. Vascular: Mild BILATERAL carotid siphon and vertebral artery atherosclerosis. No hyperdense vessel. Skull: No skull fracture or other focal osseous abnormality involving the skull. Sinuses/Orbits: Visualized paranasal sinuses, bilateral mastoid air cells and bilateral middle ear cavities well-aerated. Visualized orbits and globes normal in appearance. LEFT mastoid air cells underpneumatized. Other: Possibly occlusive cerumen involving the LEFT external auditory canal. IMPRESSION: 1. No acute intracranial abnormality. 2. Stable mild-to-moderate generalized atrophy and moderate chronic microvascular ischemic changes of the white matter diffusely. Electronically Signed   By: Evangeline Dakin M.D.   On: 04/30/2018 17:18   Dg Chest Port 1 View  Result Date: 04/30/2018 CLINICAL DATA:  Cough for 5 years, worsening over the last month. No fever. EXAM: PORTABLE CHEST 1 VIEW  COMPARISON:  Radiographs 10/21/2017 and 07/13/2017. FINDINGS: 1550 hours. The heart size and mediastinal contours are stable. There is aortic atherosclerosis. The lungs are clear. There is no pleural effusion or pneumothorax. No acute osseous findings are evident. Telemetry leads overlie the chest. IMPRESSION: Stable chest without evidence of active cardiopulmonary process. Aortic atherosclerosis. Electronically  Signed   By: Richardean Sale M.D.   On: 04/30/2018 16:28    Procedures Procedures (including critical care time)  Medications Ordered in ED Medications  sodium chloride flush (NS) 0.9 % injection 3 mL (3 mLs Intravenous Given 04/30/18 1520)  sodium chloride 0.9 % bolus 1,000 mL (0 mLs Intravenous Stopped 04/30/18 1758)     Initial Impression / Assessment and Plan / ED Course  I have reviewed the triage vital signs and the nursing notes.  Pertinent labs & imaging results that were available during my care of the patient were reviewed by me and considered in my medical decision making (see chart for details).     73 y.o. M with PMH/o diverticulosis, subdural hematoma, TB who presents with daughter for evaluation of generalized weakness, cough, and hemoptysis. Patient is afebrile, non-toxic appearing, sitting comfortably on examination table. Vital signs reviewed and stable.  Daughter reports the patient has had some generalized body aches.  Also reports some cough that worsened today.  She does not know if he got a flu shot. Patient is afebrile, non-toxic appearing, sitting comfortably on examination table. Vital signs reviewed and stable.  On exam, patient with noted abdominal tenderness.  Lungs clear to auscultation.  Consider pneumonia versus viral URI versus bronchitis versus influenza.  Plan to check labs, chest x-ray.  Additionally, given questionable falls, will plan for CT head.  Do not suspect GI bleed.  UA negative for any acute infectious etiology.  CMP shows creatinine at 1.30.   This appears to be baseline.  Influenza is negative.  Lactic acid negative.  CBC without any significant abnormalities.  X-ray negative for any acute infectious etiology.  CT head negative for any acute intracranial abnormality.  Went to discuss results with patient and family.  RN inform me that daughter has left.  She called and stated that she would be back at 2 AM after work.  Daughter is concerned because "they will be homeless in a month and she does not know what to do with him."  Patient asking for something to eat.  Abdominal exam benign at this time.  We will plan to p.o. challenge patient.    Patient able to eat dinner without any difficulty. Patient has not had any more episodes of vomiting since here in the ED.  At this time, patient exhibits no emergent life-threatening condition that require further evaluation in ED or admission. Patient is stable for discharge. Per RN who spoke to daughter earlier, she will be coming at 2am to pick him up.     Final Clinical Impressions(s) / ED Diagnoses   Final diagnoses:  Cough  Hemoptysis    ED Discharge Orders    None       Desma Mcgregor 05/01/18 2011    Pattricia Boss, MD 05/14/18 [phone]

## 2018-04-30 NOTE — ED Notes (Signed)
Daughter at bedside- wants to leave -- will not be able to pick him up if discharged until after 2 am,  [phone] - Threasa Alpha --  Pt only speaks [place].

## 2018-05-01 NOTE — ED Notes (Addendum)
PTAR at the bedside. Report given to PTAR.   Discharge instructions given to patient. VSS at time of discharge. Pt discharged from the ED with PTAR.

## 2018-05-01 NOTE — Care Management (Signed)
ED CM received call from [facility] concerning incorrect address. Patient has been transported to the address in chart in the past. CM contacted patient's daughter by phone who confirmed address change to [address] in [place], updated PTAR. Will wait to receive correct spelling of address to update records.

## 2018-05-01 NOTE — ED Notes (Signed)
Called for tray with soft foods.

## 2018-05-01 NOTE — ED Provider Notes (Signed)
Patient seen and discharged last night after presenting with concern for fatigue and cough.  Patient was to be discharged, however daughter did not come to pick him upat 2AM as she had reported she would.  Patient has been waiting for discharge today. Unclear whether he had a way to get into the home, difficult to obtain history through translator as translator does not understand patient reportedly due to dialect and chronic speech problems.  He lives at home with daughter, however she leaves him to go to work.  CSW called family. Daughter reported he had keys.  Given this, feel he is ok for planned discharge with PTAR to home.    Gareth Morgan, MD 05/01/18 2158

## 2018-05-01 NOTE — ED Notes (Signed)
Per social work patient has a key to his house. Pt is okay to go home with PTAR.

## 2018-05-01 NOTE — ED Notes (Signed)
Assisted pt to the BR.   

## 2018-05-01 NOTE — Progress Notes (Addendum)
2:35PM: CSW left voicemail informing patient [name] will be picking up the patient to return home. CSW notes patient has a key and would not be appropriate for other transportation due to his dementia and being unable to communicate with taxi/bus drivers. CSW informed daughter of discharge. CSW signing off.  1:40PM: CSW met with patient and noted patient had his house key. CSW left a follow up voicemail with patient's daughter and requested a call back.  CSW received consult over daughter having not picked up her mother. CSW noted RN spoke prior to daughter that states they will [place] reached out to patient's daughter to confirm and discuss general community supports with her. CSW will continue to follow to assist for discharge.  Lamonte Richer, LCSW, [place] Worker II [phone]

## 2018-05-01 NOTE — ED Notes (Signed)
This RN spoke with patient's daughter at this time. States she will be at work until 6pm and can come back to pick him up at that time.

## 2018-05-01 NOTE — ED Notes (Signed)
This RN attempted to contact patient's daughter at this time. No answer.

## 2018-05-25 ENCOUNTER — Encounter (HOSPITAL_COMMUNITY): Payer: Self-pay

## 2018-05-25 ENCOUNTER — Other Ambulatory Visit: Payer: Self-pay

## 2018-05-25 ENCOUNTER — Emergency Department (HOSPITAL_COMMUNITY)
Admission: EM | Admit: 2018-05-25 | Discharge: 2018-05-26 | Disposition: A | Discharge status: Home / Self Care | Payer: Medicare Other | Attending: Emergency Medicine | Admitting: Emergency Medicine

## 2018-05-25 ENCOUNTER — Emergency Department (HOSPITAL_COMMUNITY): Payer: Medicare Other

## 2018-05-25 DIAGNOSIS — R0789 Other chest pain: Secondary | ICD-10-CM | POA: Diagnosis not present

## 2018-05-25 DIAGNOSIS — Z79899 Other long term (current) drug therapy: Secondary | ICD-10-CM | POA: Insufficient documentation

## 2018-05-25 DIAGNOSIS — Z87891 Personal history of nicotine dependence: Secondary | ICD-10-CM | POA: Diagnosis not present

## 2018-05-25 DIAGNOSIS — H66002 Acute suppurative otitis media without spontaneous rupture of ear drum, left ear: Secondary | ICD-10-CM | POA: Diagnosis not present

## 2018-05-25 LAB — BASIC METABOLIC PANEL
Anion gap: 8 (ref 5–15)
BUN: 21 mg/dL (ref 8–23)
CO2: 23 mmol/L (ref 22–32)
Calcium: 9.4 mg/dL (ref 8.9–10.3)
Chloride: 108 mmol/L (ref 98–111)
Creatinine, Ser: 1.45 mg/dL — ABNORMAL HIGH (ref 0.61–1.24)
GFR calc non Af Amer: 48 mL/min — ABNORMAL LOW (ref >60)
GFR, EST AFRICAN AMERICAN: 56 mL/min — AB (ref >60)
Glucose, Bld: 92 mg/dL (ref 70–99)
Potassium: 4.1 mmol/L (ref 3.5–5.1)
Sodium: 139 mmol/L (ref 135–145)

## 2018-05-25 LAB — CBC
HCT: 36.5 % — ABNORMAL LOW (ref 39.0–52.0)
Hemoglobin: 11.9 g/dL — ABNORMAL LOW (ref 13.0–17.0)
MCH: 32.3 pg (ref 26.0–34.0)
MCHC: 32.6 g/dL (ref 30.0–36.0)
MCV: 99.2 fL (ref 80.0–100.0)
Platelets: 235 10*3/uL (ref 150–400)
RBC: 3.68 MIL/uL — ABNORMAL LOW (ref 4.22–5.81)
RDW: 11.9 % (ref 11.5–15.5)
WBC: 4.6 10*3/uL (ref 4.0–10.5)
nRBC: 0 % (ref 0.0–0.2)

## 2018-05-25 LAB — I-STAT TROPONIN, ED: Troponin i, poc: 0 ng/mL (ref 0.00–0.08)

## 2018-05-25 MED ORDER — AMOXICILLIN 500 MG PO CAPS: 1000.0000 mg | ORAL_CAPSULE | Freq: Two times a day (BID) | ORAL | 0 refills | Status: DC

## 2018-05-25 MED ORDER — SODIUM CHLORIDE 0.9% FLUSH: 3.0000 mL | Freq: Once | INTRAVENOUS | Status: DC

## 2018-05-25 NOTE — ED Triage Notes (Signed)
Pt BIB ems from home for c.o right sided chest pain when he breathes that started yesterday.  Pain with palpation. +cough for a while as well with green sputum for the past week. Pt alert.   Hx of TBI in [place].  Bp 137/92 HR78 rr20 97% room air.  CBG 122

## 2018-05-25 NOTE — Discharge Instructions (Signed)
Follow up with your family doc.  Return for worsening symptoms. Tylenol 2 extra strength tablets around the clock for the next few days.   Theo di v?i ti li?u gia ?nh c?a b?n. Quay tr? l?i cho cc tri?u ch?ng x?u ?i. Tylenol 2 vin thm s?c m?nh xung quanh ??ng h? trong vi ngy t?i.

## 2018-05-25 NOTE — ED Provider Notes (Signed)
[facility] EMERGENCY DEPARTMENT Provider Note   CSN: [id number] Arrival date & time: 05/25/18  1426    History   Chief Complaint Chief Complaint  Patient presents with  . Chest Pain    HPI [name] is a 73 y.o. male.     73 yo M with a chief complaint of cough and chest pain.  History is somewhat limited to even difficult to obtain through an interpreter as the patient is somewhat hard to understand.  What I can gather is that the patient has been coughing for the past couple days and has had right-sided chest pain.  Pain is worse with coughing he has been having some greenish sputum.  No fevers or chills.  Has been eating and drinking normally.  He denies other trauma.  Denies vomiting or diarrhea.  The history is provided by the patient and a relative. The history is limited by a language barrier. A language interpreter was used.  Chest Pain  Associated symptoms: cough   Associated symptoms: no abdominal pain, no fever, no headache, no palpitations, no shortness of breath and no vomiting   Illness  Severity:  Moderate Onset quality:  Gradual Duration:  2 days Timing:  Constant Progression:  Worsening Chronicity:  New Associated symptoms: chest pain, congestion and cough   Associated symptoms: no abdominal pain, no diarrhea, no fever, no headaches, no myalgias, no rash, no shortness of breath and no vomiting     Past Medical History:  Diagnosis Date  . Adenomatous polyp of colon 06/2010   4 polyps removed at colonoscopy by Dr Henrene Pastor  . Anxiety   . Chronic headaches   . Depression   . Diverticulosis of colon 08/2010  . Internal hemorrhoids 08/2010  . Prostate cancer [facility])    receiving radiation treatment  . Subdural hematoma, chronic ([place])   . Tuberculosis     Patient Active Problem List   Diagnosis Date Noted  . Dysuria 11/18/2017  . History of colonic polyps 10/01/2015  . Moderate dementia without behavioral disturbance ([place]) 03/22/2015  .  Right-sided low back pain with right-sided sciatica 11/30/2014  . Neuropathy 11/30/2014  . Vitamin D insufficiency 07/09/2014  . Right-sided low back pain without sciatica 07/06/2014  . Memory loss 06/24/2014  . Anemia of chronic disease 05/15/2014  . Internal hemorrhoids with complication 78/24/2353  . Degenerative disc disease, lumbar 05/06/2014  . Foraminal stenosis of lumbar region 05/06/2014  . Depression 12/13/2013  . Prostate cancer ([place]) 12/13/2013  . HYPERCHOLESTEROLEMIA, MILD 05/19/2010  . 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 BPH with urinary obstruction 09/23/2009  . HEADACHE 09/23/2009    Past Surgical History:  Procedure Laterality Date  . FLEXIBLE SIGMOIDOSCOPY  03/29/2012   Procedure: FLEXIBLE SIGMOIDOSCOPY;  Surgeon: Inda Castle, MD;  Location: [facility];  Service: Endoscopy;  Laterality: N/A;  possibly may do colonoscopy but prep is only for a flex  . FLEXIBLE SIGMOIDOSCOPY  03/31/2012   Procedure: FLEXIBLE SIGMOIDOSCOPY;  Surgeon: Inda Castle, MD;  Location: [place];  Service: Endoscopy;  Laterality: N/A;  . HEMORRHOID BANDING  03/31/2012   Procedure: HEMORRHOID BANDING;  Surgeon: Inda Castle, MD;  Location: [facility];  Service: Endoscopy;  Laterality: N/A;  . MULTIPLE EXTRACTIONS WITH ALVEOLOPLASTY N/A 08/08/2013   Procedure: MULTIPLE EXTRACTION OF TEETH #1, 2, 3, 6, 7, 9, 11, 15, 16, 17, 20, 21, 22, 23, 24, 26, 29, 30, 32 WITH ALVEOLOPLASTY AND REMOVAL BUCCAL EXOSTOSIS LEFT MAXILLA;  Surgeon: Gae Bon, DDS;  Location: [facility] OR;  Service: Oral Surgery;  Laterality: N/A;  . shrapnel removal     skull during [place] War        Home Medications    Prior to Admission medications   Medication Sig Start Date End Date Taking? Authorizing Provider  amoxicillin (AMOXIL) 500 MG capsule Take 2 capsules (1,000 mg total) by mouth 2 (two) times daily. 05/25/18   Deno Etienne, DO  guaiFENesin (MUCINEX) 600 MG 12 hr tablet Take 1 tablet (600 mg total) by mouth  2 (two) times daily. 10/21/17   Couture, Cortni S, PA-C  UNKNOWN TO PATIENT Medication for Liver    [provider]    Family History No family history on file.  Social History Social History   Tobacco Use  . Smoking status: Former Smoker    Packs/day: 0.25    Years: 5.00    Pack years: 1.25    Types: Cigarettes    Last attempt to quit: 03/16/1992    Years since quitting: 26.2  . Smokeless tobacco: Never Used  Substance Use Topics  . Alcohol use: No    Alcohol/week: 0.0 standard drinks  . Drug use: No     Allergies   Patient has no known allergies.   Review of Systems Review of Systems  Constitutional: Negative for chills and fever.  HENT: Positive for congestion. Negative for facial swelling.   Eyes: Negative for discharge and visual disturbance.  Respiratory: Positive for cough. Negative for shortness of breath.   Cardiovascular: Positive for chest pain. Negative for palpitations.  Gastrointestinal: Negative for abdominal pain, diarrhea and vomiting.  Musculoskeletal: Negative for arthralgias and myalgias.  Skin: Negative for color change and rash.  Neurological: Negative for tremors, syncope and headaches.  Psychiatric/Behavioral: Negative for confusion and dysphoric mood.     Physical Exam Updated Vital Signs BP 137/90   Pulse 97   Temp 97.6 F (36.4 C) (Oral)   Resp 15   SpO2 99%   Physical Exam Vitals signs and nursing note reviewed.  Constitutional:      Appearance: He is well-developed.  HENT:     Head: Normocephalic and atraumatic.     Comments: Swollen turbinates, posterior nasal drip, no noted sinus ttp, left TM has a purulent effusion and some obscuration of the landmarks.  Eyes:     Pupils: Pupils are equal, round, and reactive to light.  Neck:     Musculoskeletal: Normal range of motion and neck supple.     Vascular: No JVD.  Cardiovascular:     Rate and Rhythm: Normal rate and regular rhythm.     Heart sounds: No murmur. No  friction rub. No gallop.   Pulmonary:     Effort: No respiratory distress.     Breath sounds: No wheezing.  Chest:     Chest wall: Tenderness present.     Comments: Tenderness to the right lateral chest wall reproduces the patient's pain. Abdominal:     General: There is no distension.     Tenderness: There is no guarding or rebound.  Musculoskeletal: Normal range of motion.  Skin:    Coloration: Skin is not pale.     Findings: No rash.  Neurological:     Mental Status: He is alert and oriented to person, place, and time.  Psychiatric:        Behavior: Behavior normal.      ED Treatments / Results  Labs (all labs ordered are listed, but only abnormal results are displayed)  Labs Reviewed  BASIC METABOLIC PANEL - Abnormal; Notable for the following components:      Result Value   Creatinine, Ser 1.45 (*)    GFR calc non Af Amer 48 (*)    GFR calc Af Amer 56 (*)    All other components within normal limits  CBC - Abnormal; Notable for the following components:   RBC 3.68 (*)    Hemoglobin 11.9 (*)    HCT 36.5 (*)    All other components within normal limits  I-STAT TROPONIN, ED    EKG EKG Interpretation  Date/Time:  Wednesday May 25 2018 14:48:07 EDT Ventricular Rate:  78 PR Interval:  176 QRS Duration: 74 QT Interval:  376 QTC Calculation: 428 R Axis:   -11 Text Interpretation:  Normal sinus rhythm Normal ECG flipped t wave in lead III now upright Otherwise no significant change Confirmed by Deno Etienne [phone]) on 05/25/2018 8:21:46 PM   Radiology Dg Chest 2 View  Result Date: 05/25/2018 CLINICAL DATA:  73 year old male with a history of chest pain EXAM: CHEST - 2 VIEW COMPARISON:  04/30/2018, 10/21/2017, 07/13/2017 FINDINGS: Cardiomediastinal silhouette unchanged in size and contour. No pneumothorax. No pleural effusion. No confluent airspace disease. Low lung volumes. Similar appearance of partially remodeled posterior right fifth rib fracture. No acute  displaced fracture identified. IMPRESSION: Negative for acute cardiopulmonary disease. Electronically Signed   By: Corrie Mckusick D.O.   On: 05/25/2018 16:55    Procedures Procedures (including critical care time)  Medications Ordered in ED Medications  sodium chloride flush (NS) 0.9 % injection 3 mL (has no administration in time range)     Initial Impression / Assessment and Plan / ED Course  I have reviewed the triage vital signs and the nursing notes.  Pertinent labs & imaging results that were available during my care of the patient were reviewed by me and considered in my medical decision making (see chart for details).        73 yo M with a chief complaint of right-sided chest pain.  This is reproduced on palpation.  He had a troponin that was done in triage is negative.  His symptoms been going on for 2 days without significant change.  Chest x-ray reviewed by me without focal infiltrate or pneumothorax.  Most likely this is chest wall pain.  He does have left otitis media clinically on my exam.  Will start on antibiotics.  PCP follow-up  12:02 AM:  I have discussed the diagnosis/risks/treatment options with the patient and believe the pt to be eligible for discharge home to follow-up with PCP. We also discussed returning to the ED immediately if new or worsening sx occur. We discussed the sx which are most concerning (e.g., sudden worsening pain, fever, inability to tolerate by mouth) that necessitate immediate return. Medications administered to the patient during their visit and any new prescriptions provided to the patient are listed below.  Medications given during this visit Medications  sodium chloride flush (NS) 0.9 % injection 3 mL (has no administration in time range)     The patient appears reasonably screen and/or stabilized for discharge and I doubt any other medical condition or other [facility] requiring further screening, evaluation, or treatment in the ED at this time  prior to discharge.    Final Clinical Impressions(s) / ED Diagnoses   Final diagnoses:  Atypical chest pain  Acute suppurative otitis media of left ear without spontaneous rupture of tympanic membrane, recurrence not specified  ED Discharge Orders         Ordered    amoxicillin (AMOXIL) 500 MG capsule  2 times daily     05/25/18 2224           Deno Etienne, DO 05/26/18 0002

## 2018-05-25 NOTE — ED Notes (Signed)
[phone] Thao -Daughter will go out to eat. Call if needed. Pt speaks [place].

## 2018-05-26 ENCOUNTER — Other Ambulatory Visit: Payer: Self-pay

## 2018-05-26 DIAGNOSIS — R0789 Other chest pain: Secondary | ICD-10-CM | POA: Diagnosis not present

## 2018-05-26 NOTE — ED Notes (Signed)
Taxi took pt home.

## 2018-10-17 ENCOUNTER — Encounter: Payer: Self-pay | Admitting: Internal Medicine

## 2019-08-14 IMAGING — CT CT HEAD W/O CM
3 series · 16 of 47 positions shown, 19 images · non-contrast
Comparison: 05/06/2014

CLINICAL DATA: Suspect subarachnoid hemorrhage. Generalize weakness
with headache 2 weeks.

EXAM:
CT HEAD WITHOUT CONTRAST
TECHNIQUE: Contiguous axial images were obtained from the base of the skull
through the vertex without intravenous contrast.

[Series 3: head 5.0 h30s · axial · 0.40mm/px · z∈[-98,+47]mm · 10 of 35 slices shown, 13 images]
[im 3/35  brain]
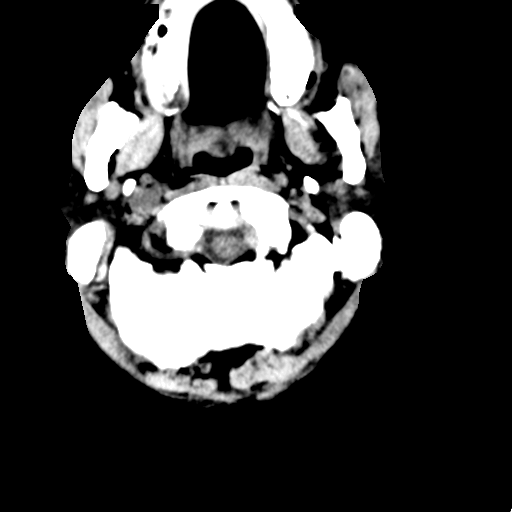
[im 3/35  bone]
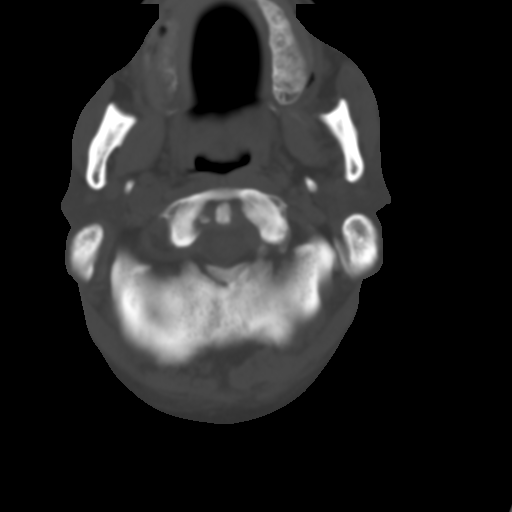
[im 6/35  brain]
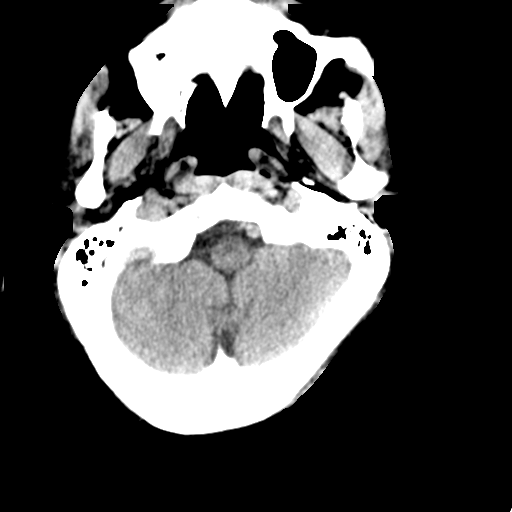
[im 10/35  brain]
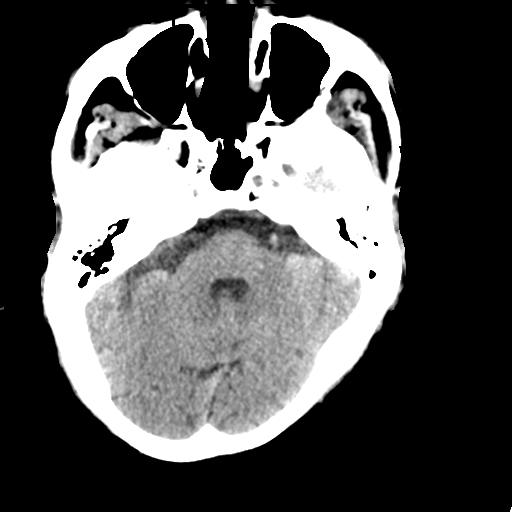
[im 12/35  brain]
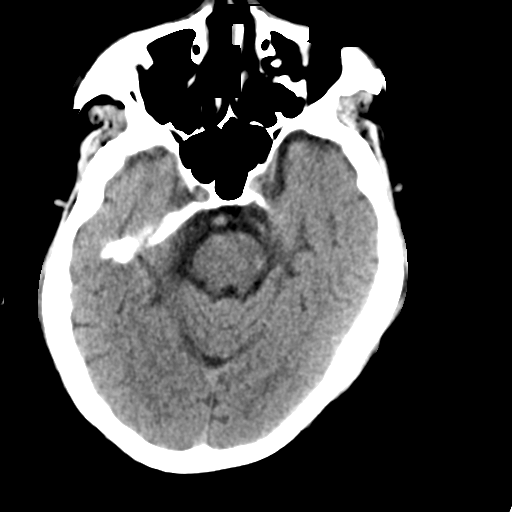
[im 16/35  brain]
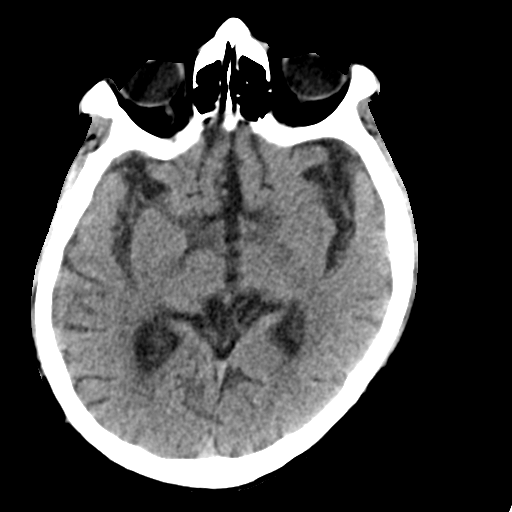
[im 16/35  bone]
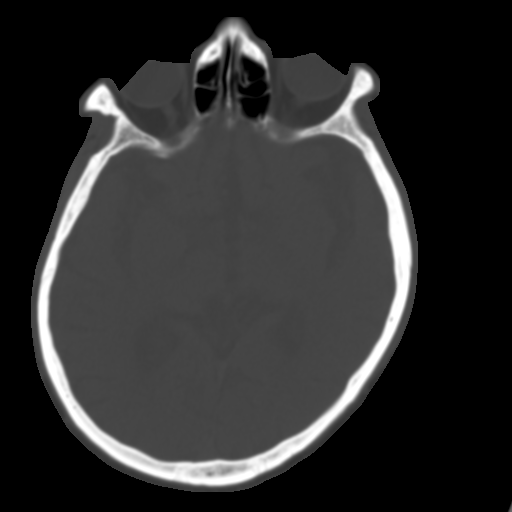
[im 19/35  brain]
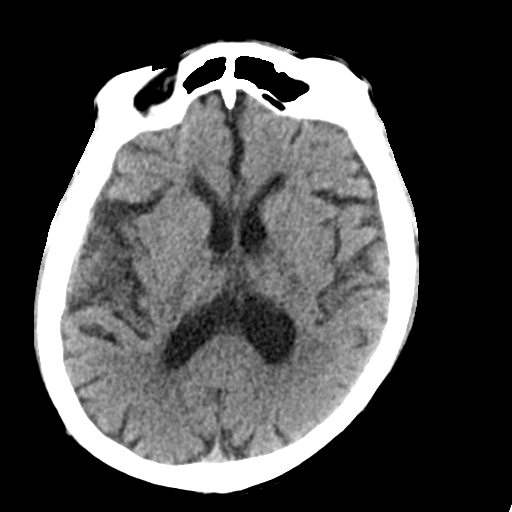
[im 23/35  brain]
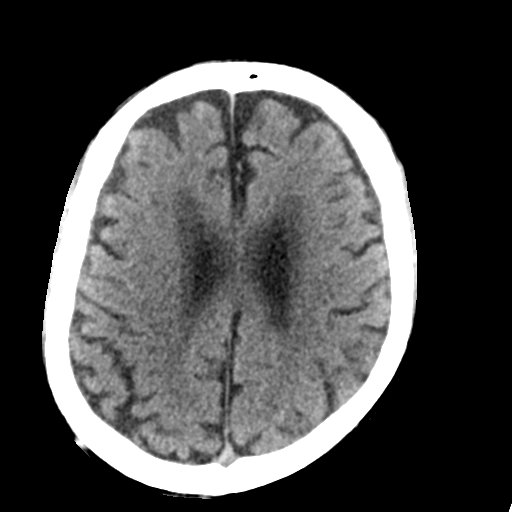
[im 26/35  brain]
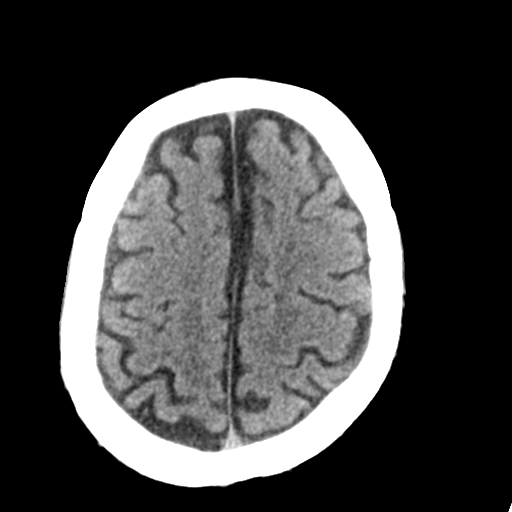
[im 29/35  brain]
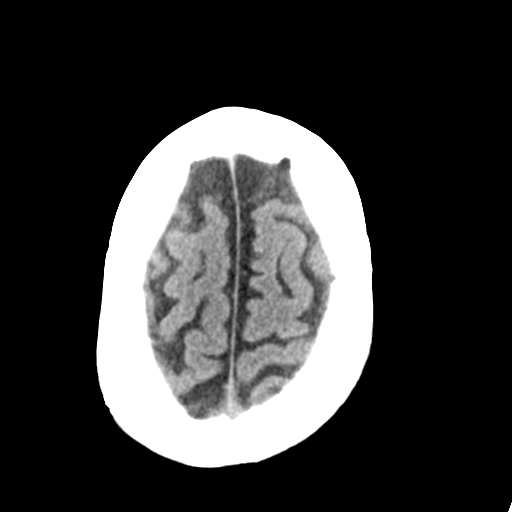
[im 29/35  bone]
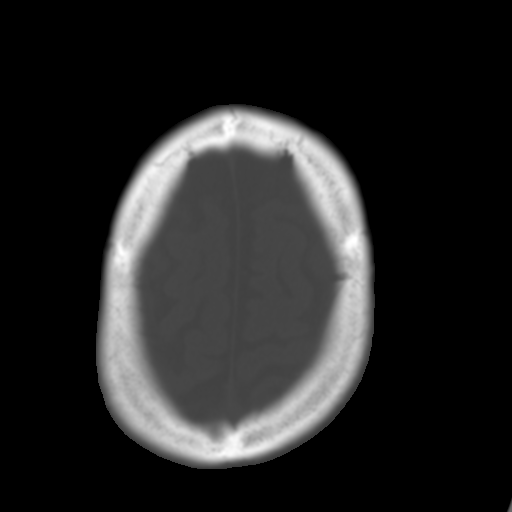
[im 32/35  brain]
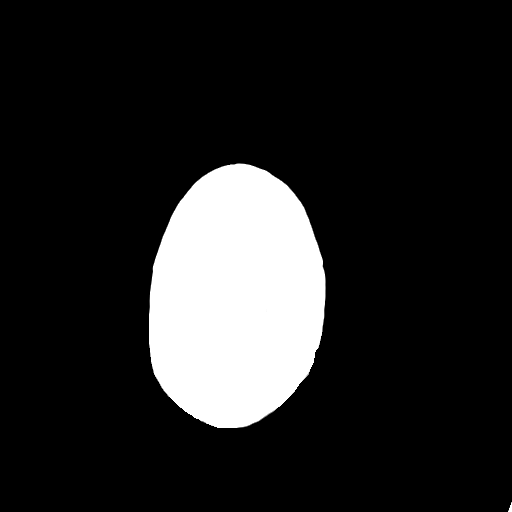

[Series 5: head 3.0 mpr cor · coronal · 0.33mm/px · 3 of 67 slices shown]
[im 23/67  brain]
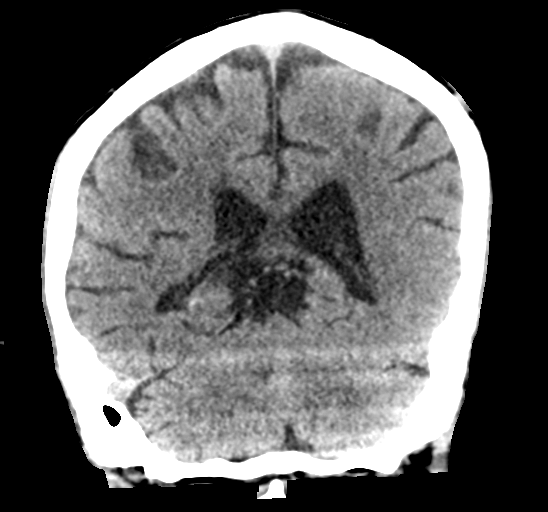
[im 30/67  brain]
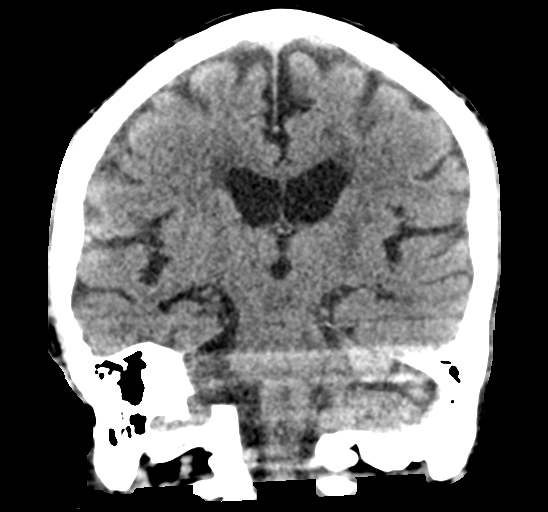
[im 37/67  brain]
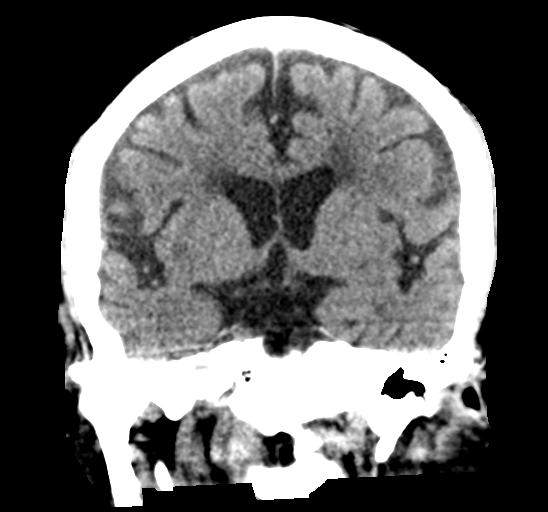

[Series 6: head 3.0 mpr sag · sagittal · 0.33mm/px · 3 of 56 slices shown]
[im 19/56  brain]
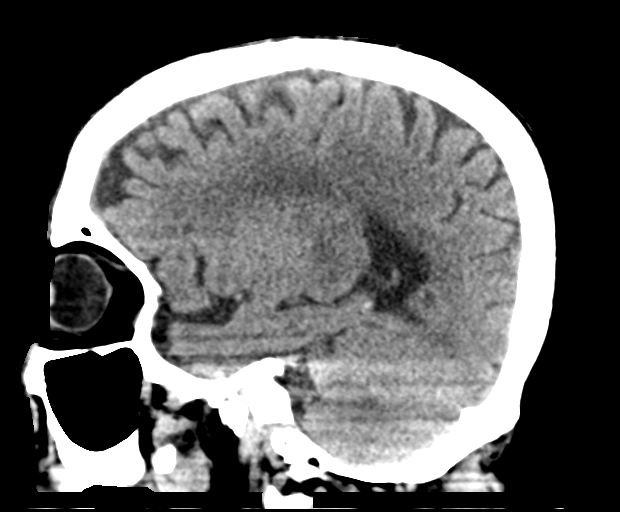
[im 28/56  brain]
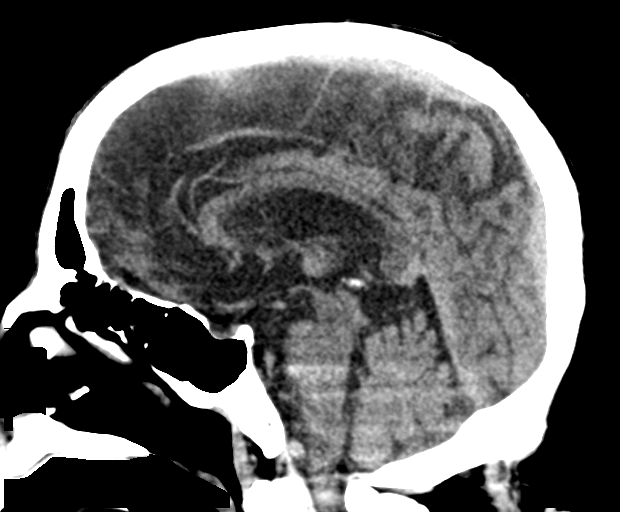
[im 37/56  brain]
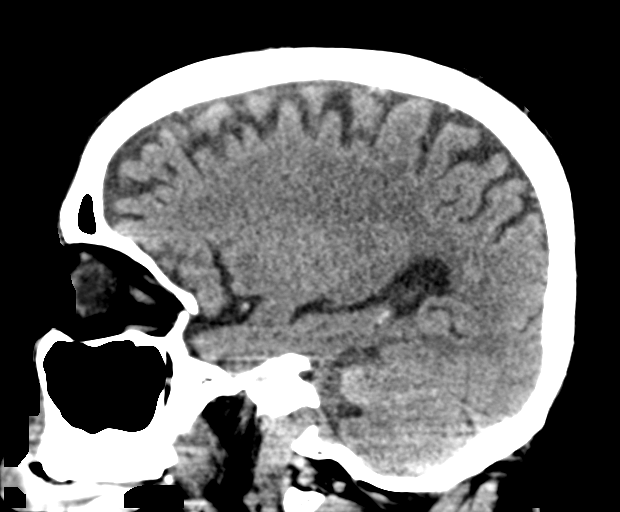

[16 of 47 positions shown; findings below may reference images not displayed]

FINDINGS: Brain: Ventricles, cisterns and other CSF spaces are within normal.
There is minimal chronic ischemic microvascular disease. There is no
mass, mass effect, shift of midline structures or acute hemorrhage.
No evidence of acute infarction.

Vascular: No hyperdense vessel or unexpected calcification.

Skull: Normal. Negative for fracture or focal lesion.

Sinuses/Orbits: No acute finding.

Other: None.
IMPRESSION: No acute intracranial findings.

Mild chronic ischemic microvascular disease.

## 2019-08-14 IMAGING — DX DG CHEST 2V
2 series · 2 of 2 positions shown · non-contrast
Comparison: 01/25/2016

CLINICAL DATA: Left shoulder pain with back pain and dizziness 1
week.

EXAM:
CHEST  2 VIEW

[w chest lat]
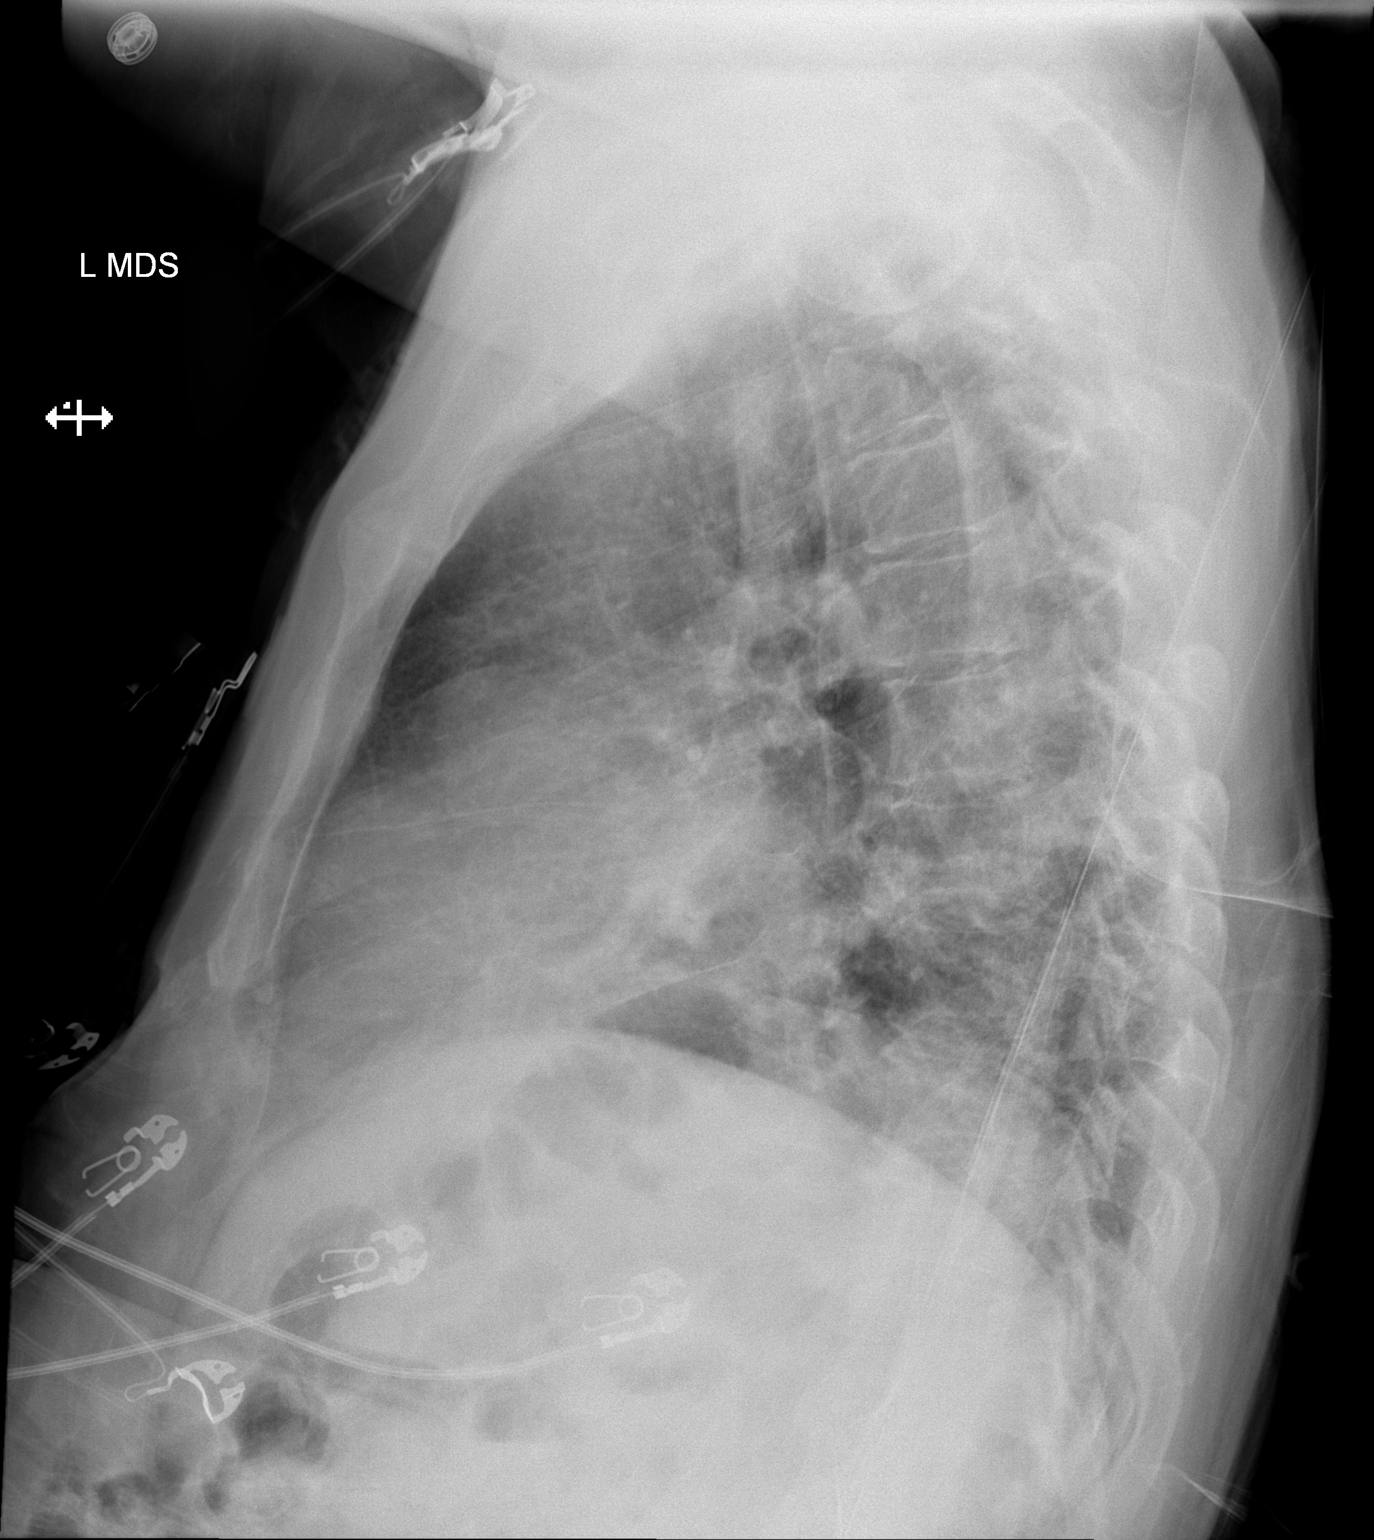

[x chest ap]
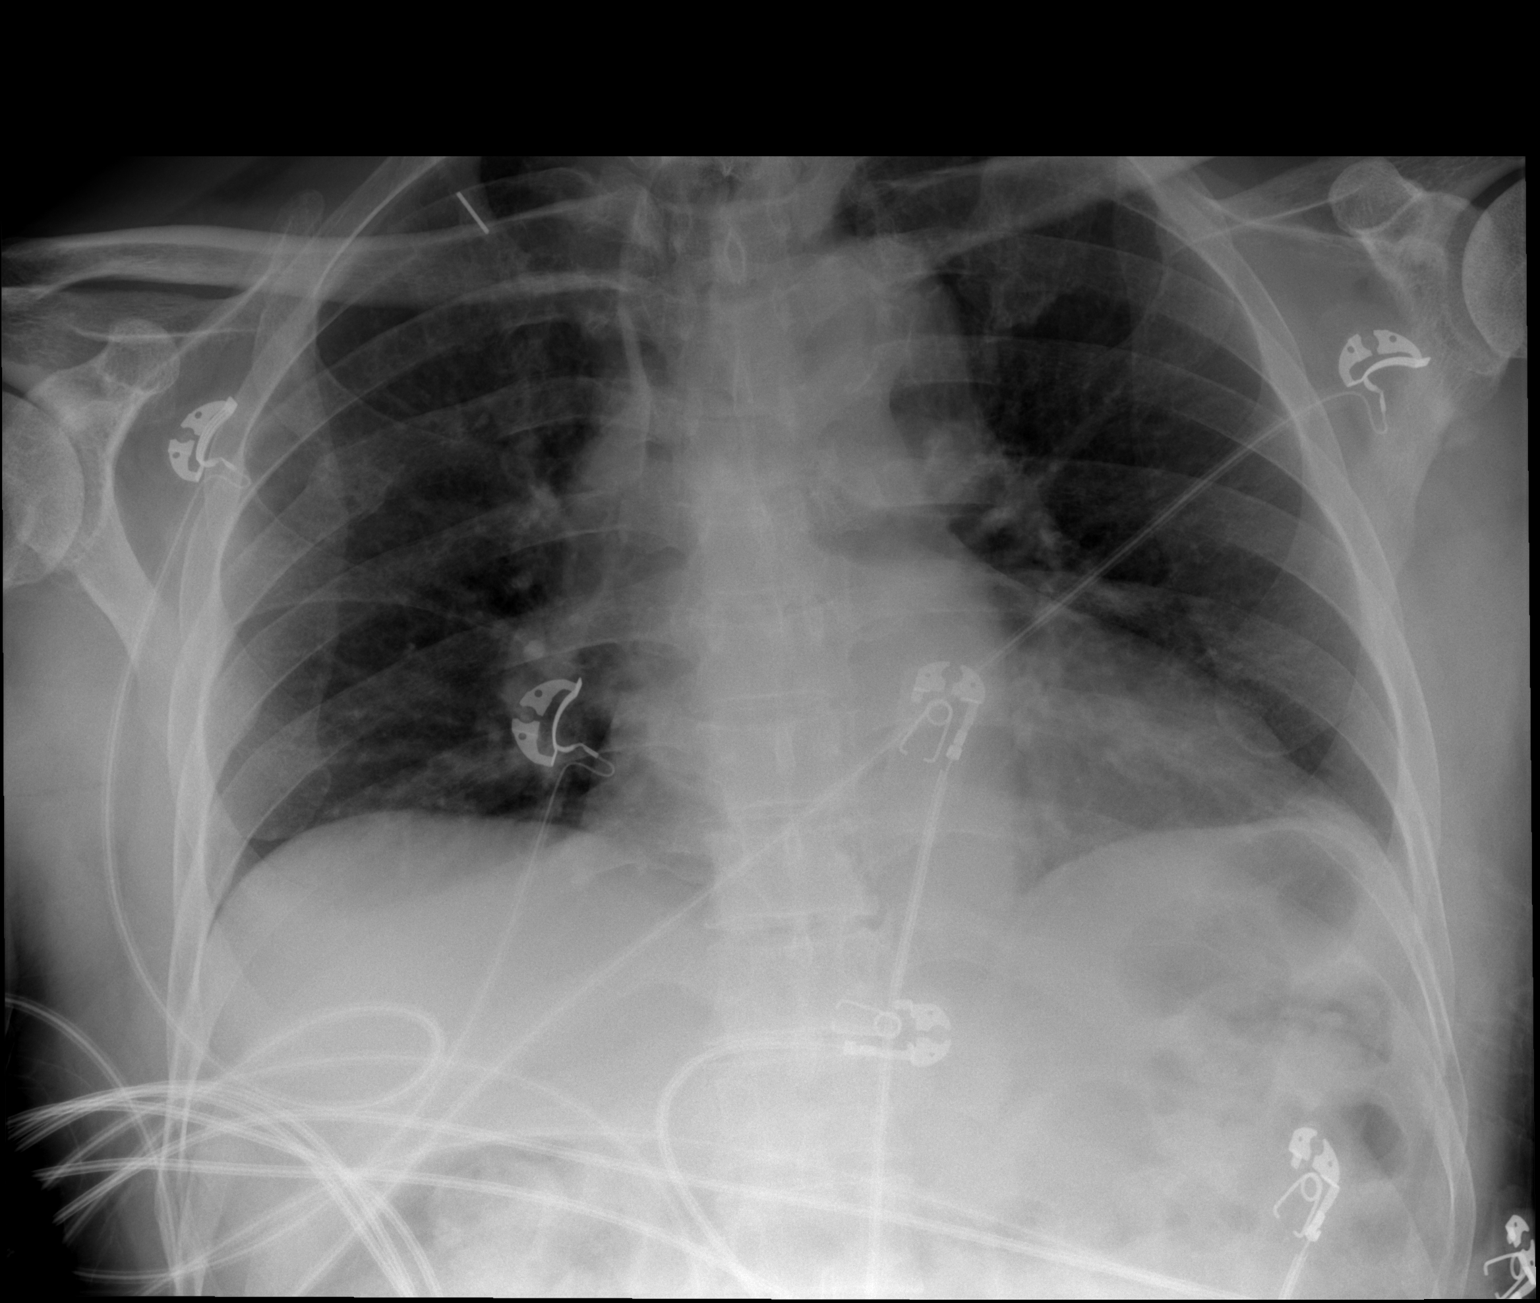

[2 of 2 positions shown; findings below may reference images not displayed]

FINDINGS: Lungs are adequately inflated without effusion or pneumothorax.
There is mild patchy opacification in the posterior lung bases on
the lateral film as cannot exclude infection. Cardiomediastinal
silhouette is within normal. There is calcified plaque over the
aortic arch. Old right fifth posterior rib fracture. Degenerative
change of the spine.
IMPRESSION: Patchy opacification over the posterior lung bases on the lateral
film which may be due to infection. Recommend follow-up to
resolution.

Aortic Atherosclerosis ([id number]).

## 2019-08-14 IMAGING — CT CT ANGIO CHEST
2 of 10 series · 18 of 46 positions shown · IV contrast (isovue)
Comparison: Chest radiograph performed earlier today at [DATE] p.m.

CLINICAL DATA: Acute onset of difficulty taking a deep breath.
Initial encounter.

EXAM:
CT ANGIOGRAPHY CHEST WITH CONTRAST
TECHNIQUE: Multidetector CT imaging of the chest was performed using the
standard protocol during bolus administration of intravenous
contrast. Multiplanar CT image reconstructions and MIPs were
obtained to evaluate the vascular anatomy.
CONTRAST:  51 mL of Isovue 370 IV contrast

[Series 8: thins · axial · 0.60mm/px · z∈[+1110,+1330]mm · 15 of 244 slices shown]
[im 12/244  lung]
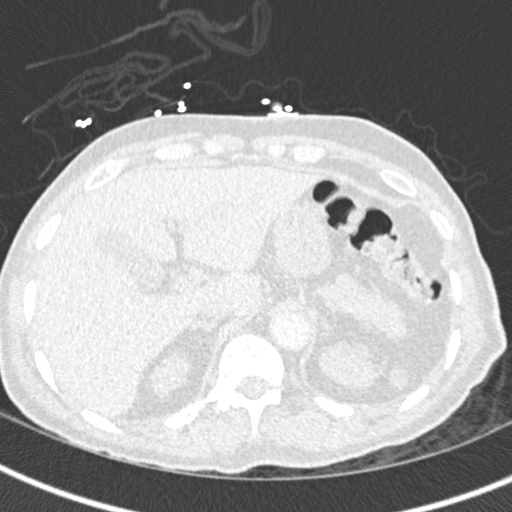
[im 35/244  soft-tissue]
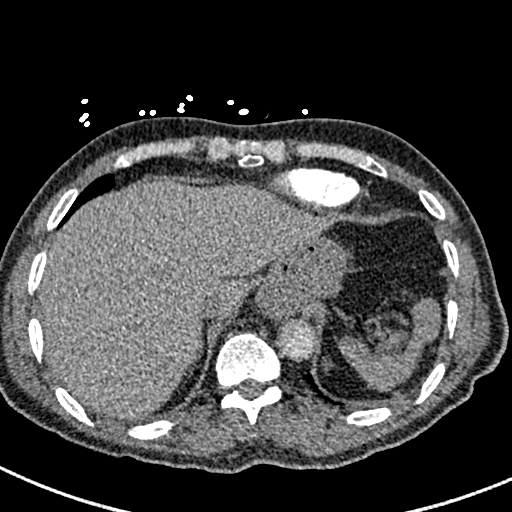
[im 47/244  lung]
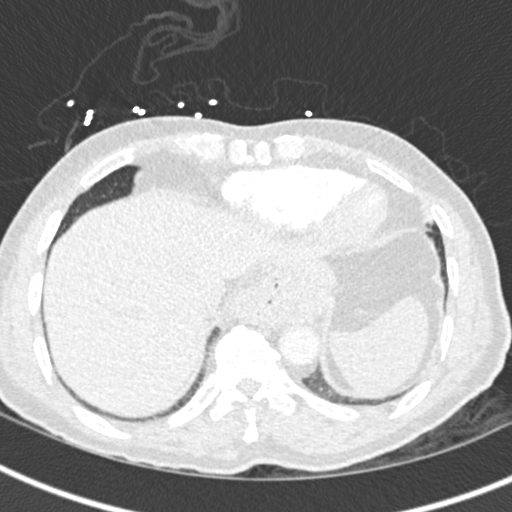
[im 58/244  soft-tissue]
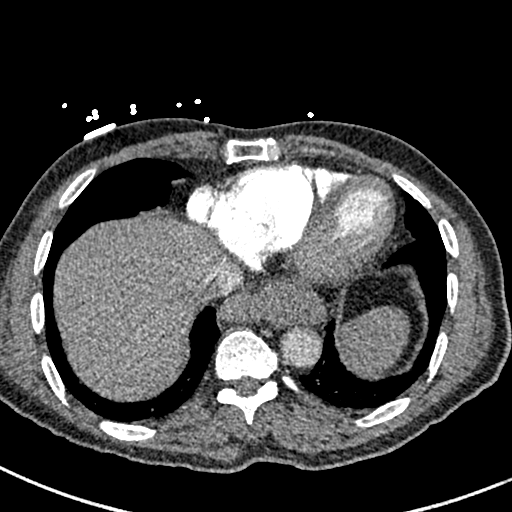
[im 82/244  lung]
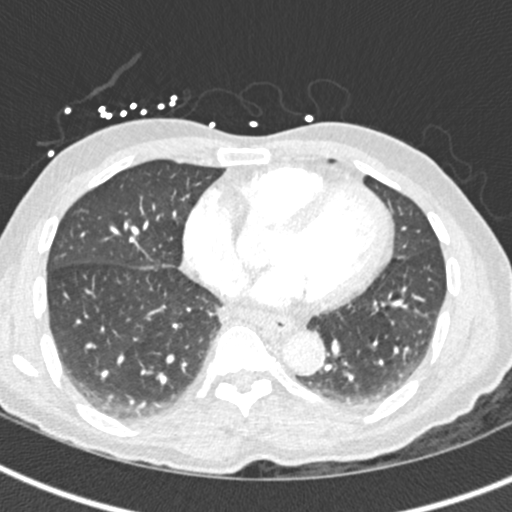
[im 93/244  soft-tissue]
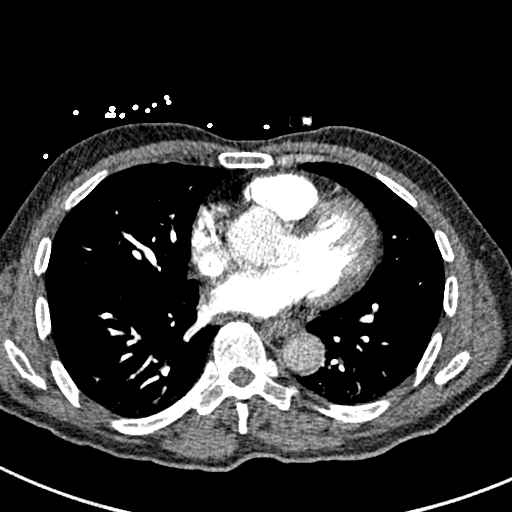
[im 105/244  lung]
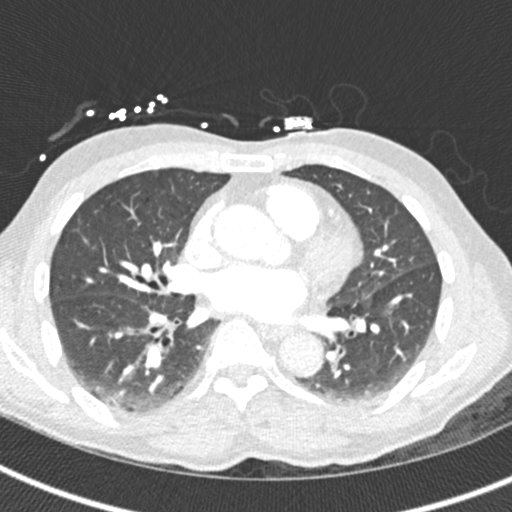
[im 128/244  soft-tissue]
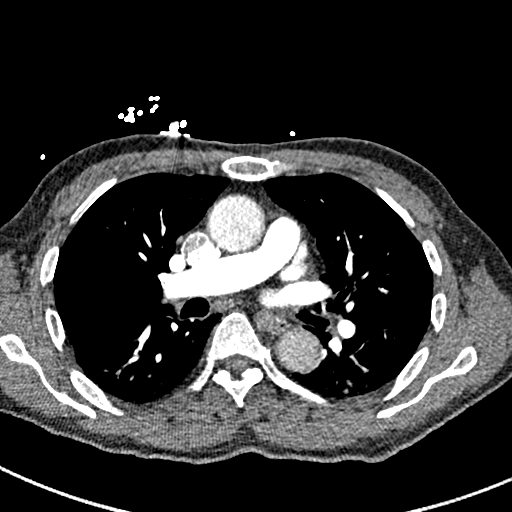
[im 139/244  lung]
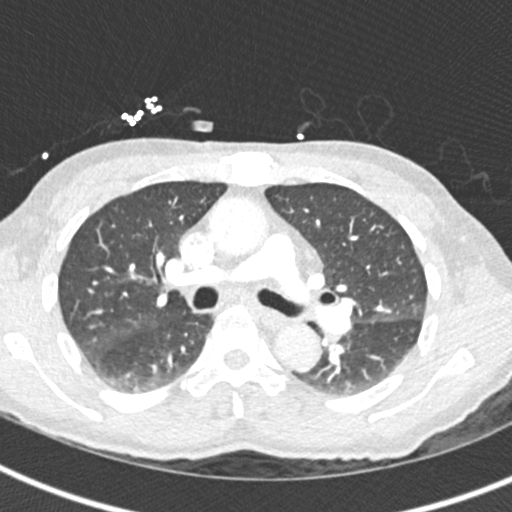
[im 151/244  soft-tissue]
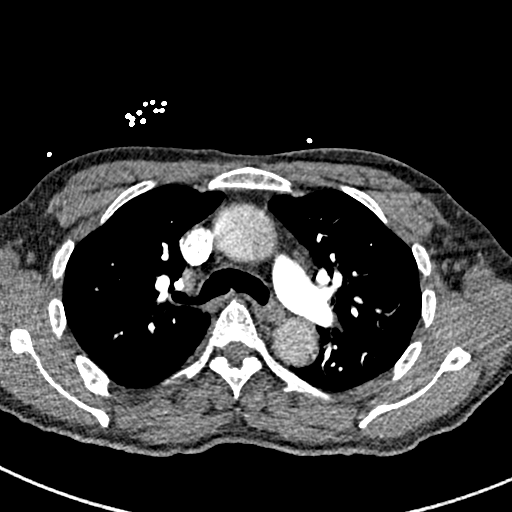
[im 174/244  lung]
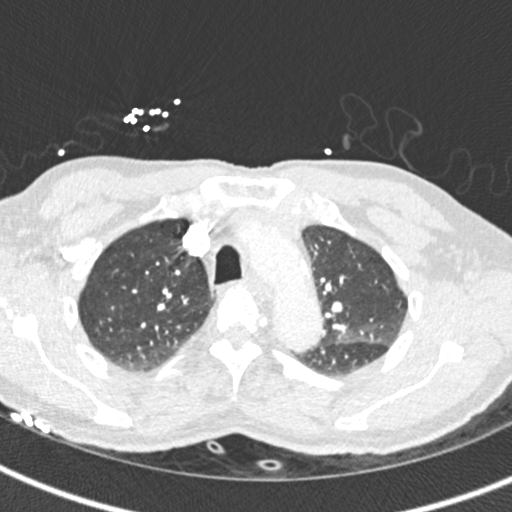
[im 186/244  soft-tissue]
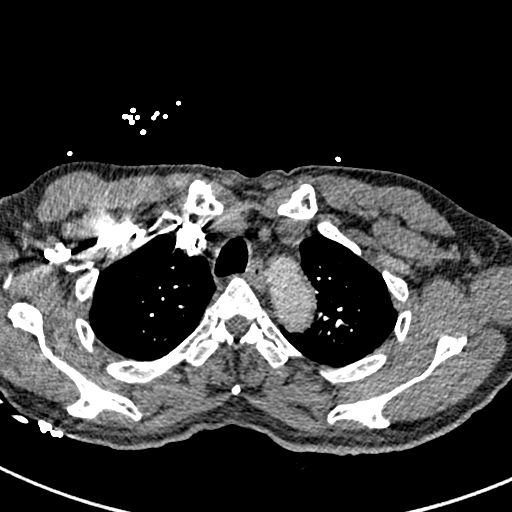
[im 197/244  lung]
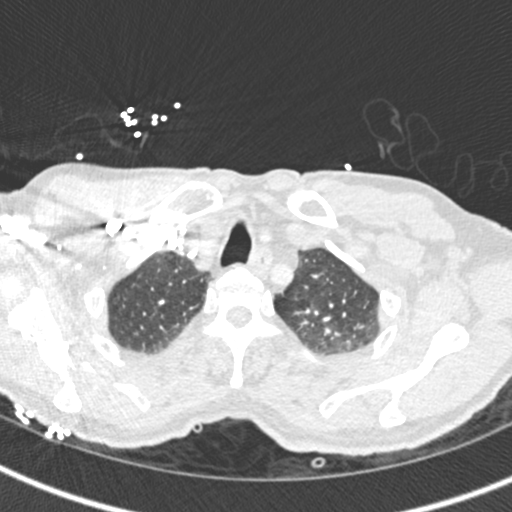
[im 220/244  soft-tissue]
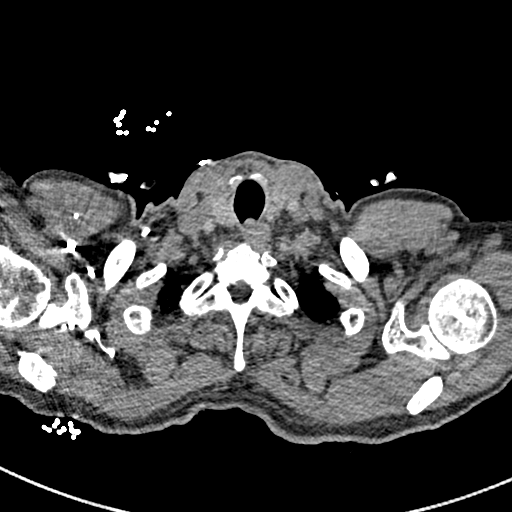
[im 232/244  lung]
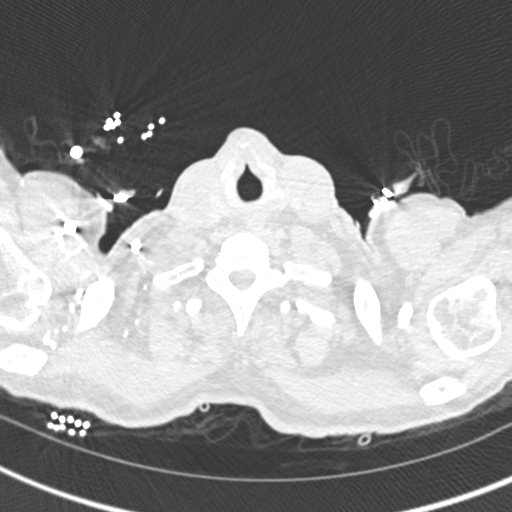

[Series 10: coronal mpr · coronal · 0.55mm/px · 3 of 103 slices shown]
[im 26/103  soft-tissue]
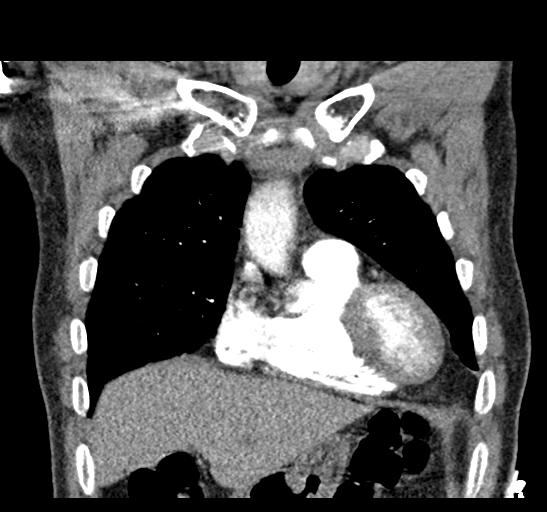
[im 52/103  soft-tissue]
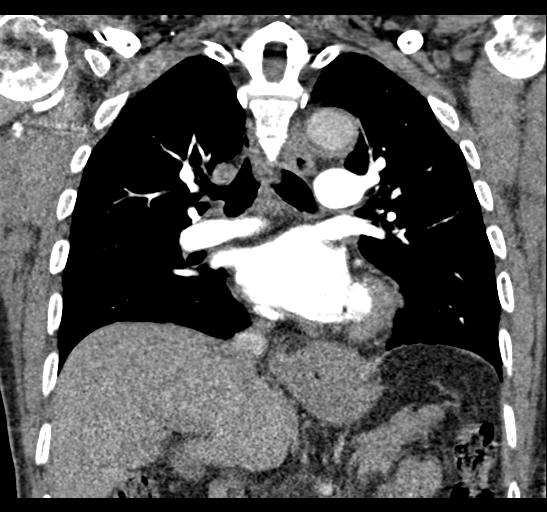
[im 77/103  soft-tissue]
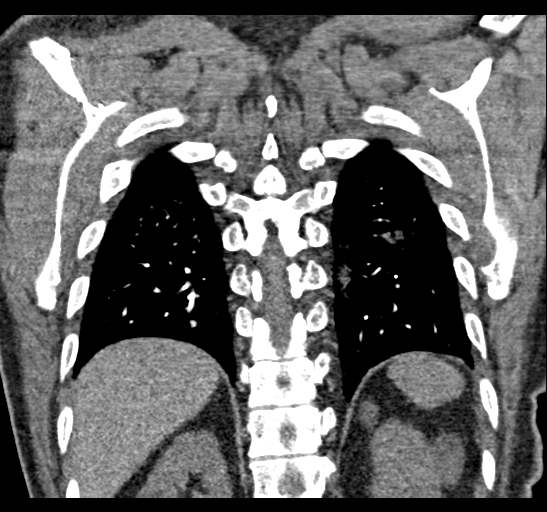

[18 of 46 positions shown; findings below may reference images not displayed]

FINDINGS: Cardiovascular: There is no evidence of pulmonary embolus. The heart
is normal in size. Scattered calcification is noted at the aortic
arch. The great vessels are grossly unremarkable in appearance.
Scattered coronary artery calcifications are seen.

Mediastinum/Nodes: A moderate hiatal hernia is noted. No mediastinal
lymphadenopathy is seen. No pericardial effusion is identified. The
thyroid gland is unremarkable. No axillary lymphadenopathy is
appreciated.

Lungs/Pleura: Minimal left basilar airspace opacity raises concern
for pneumonia. No pleural effusion or pneumothorax is seen. No
masses are identified.

Upper Abdomen: The visualized portions of the liver and spleen are
unremarkable. The visualized portions of the gallbladder, pancreas,
and adrenal glands are within normal limits. Bilateral renal cysts
are noted.

Musculoskeletal: No acute osseous abnormalities are identified. The
visualized musculature is unremarkable in appearance.

Review of the MIP images confirms the above findings.
IMPRESSION: 1. No evidence of pulmonary embolus.
2. Minimal left basilar airspace opacity raises concern for
pneumonia.
3. Moderate hiatal hernia.
4. Bilateral renal cysts noted.
5. Scattered coronary artery calcifications noted.

## 2020-01-26 ENCOUNTER — Emergency Department (HOSPITAL_COMMUNITY): Payer: Medicare Other

## 2020-01-26 ENCOUNTER — Inpatient Hospital Stay (HOSPITAL_COMMUNITY): Payer: Medicare Other

## 2020-01-26 ENCOUNTER — Encounter (HOSPITAL_COMMUNITY): Payer: Self-pay | Admitting: Emergency Medicine

## 2020-01-26 ENCOUNTER — Inpatient Hospital Stay (HOSPITAL_COMMUNITY)
Admission: EM | Admit: 2020-01-26 | Discharge: 2020-02-02 | DRG: 377 | Disposition: A | Discharge status: Home Health | Payer: Medicare Other | Attending: Family Medicine | Admitting: Family Medicine

## 2020-01-26 DIAGNOSIS — K5900 Constipation, unspecified: Secondary | ICD-10-CM | POA: Diagnosis not present

## 2020-01-26 DIAGNOSIS — Z87891 Personal history of nicotine dependence: Secondary | ICD-10-CM

## 2020-01-26 DIAGNOSIS — Y93E1 Activity, personal bathing and showering: Secondary | ICD-10-CM | POA: Diagnosis not present

## 2020-01-26 DIAGNOSIS — E8770 Fluid overload, unspecified: Secondary | ICD-10-CM | POA: Diagnosis not present

## 2020-01-26 DIAGNOSIS — K5730 Diverticulosis of large intestine without perforation or abscess without bleeding: Secondary | ICD-10-CM | POA: Diagnosis present

## 2020-01-26 DIAGNOSIS — I6203 Nontraumatic chronic subdural hemorrhage: Secondary | ICD-10-CM | POA: Diagnosis present

## 2020-01-26 DIAGNOSIS — K627 Radiation proctitis: Secondary | ICD-10-CM

## 2020-01-26 DIAGNOSIS — W1839XA Other fall on same level, initial encounter: Secondary | ICD-10-CM | POA: Diagnosis not present

## 2020-01-26 DIAGNOSIS — Y92012 Bathroom of single-family (private) house as the place of occurrence of the external cause: Secondary | ICD-10-CM | POA: Diagnosis not present

## 2020-01-26 DIAGNOSIS — N179 Acute kidney failure, unspecified: Secondary | ICD-10-CM | POA: Diagnosis not present

## 2020-01-26 DIAGNOSIS — Z9181 History of falling: Secondary | ICD-10-CM

## 2020-01-26 DIAGNOSIS — R109 Unspecified abdominal pain: Secondary | ICD-10-CM | POA: Insufficient documentation

## 2020-01-26 DIAGNOSIS — R296 Repeated falls: Secondary | ICD-10-CM | POA: Diagnosis not present

## 2020-01-26 DIAGNOSIS — S80812A Abrasion, left lower leg, initial encounter: Secondary | ICD-10-CM | POA: Diagnosis present

## 2020-01-26 DIAGNOSIS — M7989 Other specified soft tissue disorders: Secondary | ICD-10-CM | POA: Diagnosis not present

## 2020-01-26 DIAGNOSIS — R710 Precipitous drop in hematocrit: Secondary | ICD-10-CM

## 2020-01-26 DIAGNOSIS — N2889 Other specified disorders of kidney and ureter: Secondary | ICD-10-CM

## 2020-01-26 DIAGNOSIS — K921 Melena: Secondary | ICD-10-CM | POA: Diagnosis present

## 2020-01-26 DIAGNOSIS — Z8546 Personal history of malignant neoplasm of prostate: Secondary | ICD-10-CM

## 2020-01-26 DIAGNOSIS — Z20822 Contact with and (suspected) exposure to covid-19: Secondary | ICD-10-CM | POA: Diagnosis present

## 2020-01-26 DIAGNOSIS — Z79899 Other long term (current) drug therapy: Secondary | ICD-10-CM

## 2020-01-26 DIAGNOSIS — R1310 Dysphagia, unspecified: Secondary | ICD-10-CM

## 2020-01-26 DIAGNOSIS — D125 Benign neoplasm of sigmoid colon: Secondary | ICD-10-CM | POA: Diagnosis not present

## 2020-01-26 DIAGNOSIS — R195 Other fecal abnormalities: Secondary | ICD-10-CM

## 2020-01-26 DIAGNOSIS — N281 Cyst of kidney, acquired: Secondary | ICD-10-CM | POA: Diagnosis not present

## 2020-01-26 DIAGNOSIS — R1312 Dysphagia, oropharyngeal phase: Secondary | ICD-10-CM | POA: Diagnosis not present

## 2020-01-26 DIAGNOSIS — K2100 Gastro-esophageal reflux disease with esophagitis, without bleeding: Secondary | ICD-10-CM | POA: Diagnosis not present

## 2020-01-26 DIAGNOSIS — N1832 Chronic kidney disease, stage 3b: Secondary | ICD-10-CM | POA: Diagnosis not present

## 2020-01-26 DIAGNOSIS — S50311A Abrasion of right elbow, initial encounter: Secondary | ICD-10-CM | POA: Diagnosis present

## 2020-01-26 DIAGNOSIS — K625 Hemorrhage of anus and rectum: Secondary | ICD-10-CM

## 2020-01-26 DIAGNOSIS — K859 Acute pancreatitis without necrosis or infection, unspecified: Secondary | ICD-10-CM

## 2020-01-26 DIAGNOSIS — S80811A Abrasion, right lower leg, initial encounter: Secondary | ICD-10-CM | POA: Diagnosis present

## 2020-01-26 DIAGNOSIS — W19XXXA Unspecified fall, initial encounter: Secondary | ICD-10-CM

## 2020-01-26 DIAGNOSIS — K5521 Angiodysplasia of colon with hemorrhage: Principal | ICD-10-CM | POA: Diagnosis present

## 2020-01-26 DIAGNOSIS — Z86010 Personal history of colonic polyps: Secondary | ICD-10-CM

## 2020-01-26 DIAGNOSIS — D7589 Other specified diseases of blood and blood-forming organs: Secondary | ICD-10-CM | POA: Diagnosis present

## 2020-01-26 DIAGNOSIS — F0390 Unspecified dementia without behavioral disturbance: Secondary | ICD-10-CM | POA: Diagnosis present

## 2020-01-26 DIAGNOSIS — Z923 Personal history of irradiation: Secondary | ICD-10-CM | POA: Diagnosis not present

## 2020-01-26 DIAGNOSIS — K449 Diaphragmatic hernia without obstruction or gangrene: Secondary | ICD-10-CM | POA: Diagnosis present

## 2020-01-26 DIAGNOSIS — Z8719 Personal history of other diseases of the digestive system: Secondary | ICD-10-CM

## 2020-01-26 DIAGNOSIS — D509 Iron deficiency anemia, unspecified: Secondary | ICD-10-CM

## 2020-01-26 DIAGNOSIS — D500 Iron deficiency anemia secondary to blood loss (chronic): Secondary | ICD-10-CM | POA: Diagnosis present

## 2020-01-26 DIAGNOSIS — S300XXA Contusion of lower back and pelvis, initial encounter: Secondary | ICD-10-CM | POA: Diagnosis not present

## 2020-01-26 DIAGNOSIS — K219 Gastro-esophageal reflux disease without esophagitis: Secondary | ICD-10-CM

## 2020-01-26 LAB — COMPREHENSIVE METABOLIC PANEL
ALT: 39 U/L (ref 0–44)
AST: 50 U/L — ABNORMAL HIGH (ref 15–41)
Albumin: 3.3 g/dL — ABNORMAL LOW (ref 3.5–5.0)
Alkaline Phosphatase: 78 U/L (ref 38–126)
Anion gap: 8 (ref 5–15)
BUN: 35 mg/dL — ABNORMAL HIGH (ref 8–23)
CO2: 21 mmol/L — ABNORMAL LOW (ref 22–32)
Calcium: 8.8 mg/dL — ABNORMAL LOW (ref 8.9–10.3)
Chloride: 108 mmol/L (ref 98–111)
Creatinine, Ser: 1.73 mg/dL — ABNORMAL HIGH (ref 0.61–1.24)
GFR, Estimated: 41 mL/min — ABNORMAL LOW (ref >60)
Glucose, Bld: 92 mg/dL (ref 70–99)
Potassium: 4.7 mmol/L (ref 3.5–5.1)
Sodium: 137 mmol/L (ref 135–145)
Total Bilirubin: 0.8 mg/dL (ref 0.3–1.2)
Total Protein: 6.5 g/dL (ref 6.5–8.1)

## 2020-01-26 LAB — CBC WITH DIFFERENTIAL/PLATELET
Abs Immature Granulocytes: 0.03 10*3/uL (ref 0.00–0.07)
Basophils Absolute: 0 10*3/uL (ref 0.0–0.1)
Basophils Relative: 1 %
Eosinophils Absolute: 0.1 10*3/uL (ref 0.0–0.5)
Eosinophils Relative: 2 %
HCT: 28 % — ABNORMAL LOW (ref 39.0–52.0)
Hemoglobin: 9.5 g/dL — ABNORMAL LOW (ref 13.0–17.0)
Immature Granulocytes: 1 %
Lymphocytes Relative: 21 %
Lymphs Abs: 1.1 10*3/uL (ref 0.7–4.0)
MCH: 34.7 pg — ABNORMAL HIGH (ref 26.0–34.0)
MCHC: 33.9 g/dL (ref 30.0–36.0)
MCV: 102.2 fL — ABNORMAL HIGH (ref 80.0–100.0)
Monocytes Absolute: 0.5 10*3/uL (ref 0.1–1.0)
Monocytes Relative: 10 %
Neutro Abs: 3.5 10*3/uL (ref 1.7–7.7)
Neutrophils Relative %: 65 %
Platelets: 301 10*3/uL (ref 150–400)
RBC: 2.74 MIL/uL — ABNORMAL LOW (ref 4.22–5.81)
RDW: 12.3 % (ref 11.5–15.5)
WBC: 5.3 10*3/uL (ref 4.0–10.5)
nRBC: 0.6 % — ABNORMAL HIGH (ref 0.0–0.2)

## 2020-01-26 LAB — RESPIRATORY PANEL BY RT PCR (FLU A&B, COVID)
Influenza A by PCR: NEGATIVE
Influenza B by PCR: NEGATIVE
SARS Coronavirus 2 by RT PCR: NEGATIVE

## 2020-01-26 LAB — URINALYSIS, ROUTINE W REFLEX MICROSCOPIC
Bilirubin Urine: NEGATIVE
Glucose, UA: NEGATIVE mg/dL
Hgb urine dipstick: NEGATIVE
Ketones, ur: NEGATIVE mg/dL
Leukocytes,Ua: NEGATIVE
Nitrite: NEGATIVE
Protein, ur: NEGATIVE mg/dL
Specific Gravity, Urine: 1.009 (ref 1.005–1.030)
pH: 6 (ref 5.0–8.0)

## 2020-01-26 LAB — TROPONIN I (HIGH SENSITIVITY)
Troponin I (High Sensitivity): 7 ng/L (ref <18)
Troponin I (High Sensitivity): 7 ng/L (ref <18)

## 2020-01-26 LAB — BRAIN NATRIURETIC PEPTIDE: B Natriuretic Peptide: 28.5 pg/mL (ref 0.0–100.0)

## 2020-01-26 LAB — TSH: TSH: 3.127 u[IU]/mL (ref 0.350–4.500)

## 2020-01-26 LAB — POC OCCULT BLOOD, ED: Fecal Occult Bld: POSITIVE — AB

## 2020-01-26 LAB — LIPASE, BLOOD: Lipase: 108 U/L — ABNORMAL HIGH (ref 11–51)

## 2020-01-26 LAB — LACTIC ACID, PLASMA: Lactic Acid, Venous: 1 mmol/L (ref 0.5–1.9)

## 2020-01-26 MED ORDER — IOHEXOL 300 MG/ML  SOLN
100.0000 mL | Freq: Once | INTRAMUSCULAR | Status: AC | PRN
  Administered 2020-01-26: 100 mL via INTRAVENOUS

## 2020-01-26 MED ORDER — PANTOPRAZOLE SODIUM 40 MG IV SOLR
40.0000 mg | Freq: Two times a day (BID) | INTRAVENOUS | Status: DC
  Administered 2020-01-26 – 2020-01-30 (×8): 40 mg via INTRAVENOUS
  Filled 2020-01-26 (×8): qty 40

## 2020-01-26 MED ORDER — POLYETHYLENE GLYCOL 3350 17 G PO PACK
17.0000 g | PACK | Freq: Two times a day (BID) | ORAL | Status: DC
  Administered 2020-01-27 – 2020-01-31 (×6): 17 g via ORAL
  Filled 2020-01-26 (×8): qty 1

## 2020-01-26 MED ORDER — SENNA 8.6 MG PO TABS
1.0000 | ORAL_TABLET | Freq: Every day | ORAL | Status: DC
  Administered 2020-01-27 – 2020-02-02 (×4): 8.6 mg via ORAL
  Filled 2020-01-26 (×4): qty 1

## 2020-01-26 MED ORDER — FUROSEMIDE 10 MG/ML IJ SOLN
40.0000 mg | Freq: Once | INTRAMUSCULAR | Status: AC
  Administered 2020-01-26: 40 mg via INTRAVENOUS
  Filled 2020-01-26: qty 4

## 2020-01-26 MED ORDER — ENOXAPARIN SODIUM 40 MG/0.4ML ~~LOC~~ SOLN
40.0000 mg | SUBCUTANEOUS | Status: DC
  Administered 2020-01-26: 40 mg via SUBCUTANEOUS
  Filled 2020-01-26: qty 0.4

## 2020-01-26 MED ORDER — LACTATED RINGERS IV SOLN: INTRAVENOUS | Status: DC

## 2020-01-26 NOTE — H&P (Signed)
Patient evaluated by me today before 5:30 PM. I have discussed care plan with the resident.  I will cosign resident's note once it is completed.

## 2020-01-26 NOTE — Plan of Care (Signed)

## 2020-01-26 NOTE — H&P (Addendum)
[facility] Admission History and Physical Service Pager: [phone]  Patient name: [name] Medical record number: [id number] Date of birth: November 13, 1945 Age: 74 y.o. Gender: male  Primary Care Provider: Matilde Haymaker, MD Consultants: None Code Status: FULL Preferred Emergency Contact: Daughter Reece Levy)  Chief Complaint: Lower extremity edema, abdominal pain  Assessment and Plan: [name] is a 74 y.o. male presenting with lower extremity edema and abdominal pain. PMH is significant for dementia, frequent falls, chronic subdural hematoma, CKD stage 3, and oropharyngeal dysphagia.  Of note, history is extremely limited as patient is vietnamese speaking only and interpreters are unable to understand patient, as he is edentulous.   Abdominal Pain Constipation Unclear duration, but has been present for at least the past 3 days. On chart review, patient was seen at outside hospital on 11/2 and also complained of abdominal pain at that time. In the ED patient's vitals are stable. His abdomen is firm on exam and mildly tender in the LUQ. Rectal exam demonstrated extremely hard stool, FOBT+. CBC remarkable for Hgb 9.5 with MCV 102.2 but otherwise normal. CMP showed Cr 1.73, albumin 3.3, AST 50, and lipase elevated at 108. CT abdomen pelvis obtained in the ED showed asymmetric thickening of internal oblique muscle on left and an 52mm enhancing area on lateral left kidney unchanged from prior. Differential includes constipation vs. pancreatitis vs. gastritis vs. abdominal muscle strain related to fall. [name] reports that patient fell in shower at home recently; timeline unclear. History extremely limited (see below). Will start bowel regimen for constipation, can consider enema if no improvement. Tylenol for pain, holding on NSAIDS & opioids given AKI and constipation. Start IV PPI for gastritis and possible GIB. Low concern for infection given no WBC, stable VS. No  abx provided in ED. Patient reporting that he is hungry to floor RN. Last colonoscopy in 2017 with 5 polyps removed w/ instructions for repeat in five years.  -Admit to [facility], med tele, inpatient, attending Dr. Gwendlyn Deutscher -OOB with assistance only -Vitals per routine -NPO -IV Protonix 40mg  BID -Miralax and Senna for constipation -Gentle hydration with LR @ 50 cc/hr (increase mIVF if echo normal)  - bedside swallow  - advance diet as tolerated   Lower Extremity Edema Pain in lower extremities Per daughter, lower extremity swelling started after patient was prescribed azithromycin at another hospital. He has 2+ pitting edema of bilateral lower extremities on exam. He was given Lasix 40mg  IV x1 in the ED and subsequently had 1322mL urine output. Bilateral lower extremity ultrasounds were negative for DVT. Differential includes CHF vs. atypical adverse reaction to azithromycin vs. venous insufficiency. BNP 28.5, which argues against CHF. Chronicity is unclear as history is extremely limited. -Will obtain echo -Continue to monitor -Consider additional diuresis pending echo results -Lower extremity DG as below   Fall  Hx of Recurrent falls Chronic subdural hematoma Per chart review, patient with recurrent falls dating back to 2019. Also has chronic subdural hematoma that neurosurgery deemed to be nonsurgical. Per chart review, it was recommended patient have 24/7 supervision after prior discharges, but patient lives alone without any help at home. On exam, patient is tense in all four extremities. Can relax when asked but does wince with active ROM in upper and lower extremities. Per day floor RN, more information was obtained from patient by nurse who speaks [place]. Apparently, patient had a fall at home and patient reports pain on left side. Daughter also reported patient fell in the shower.  Given diffuse, nonspecific tenderness to b/l upper and b/l lower extremities, will obtain DG lower and upper  extremities, pelvis.  Patient with bruising outlined by day RN.   -follow up head CT results - obtain DG chest, pelvis, left upper and lower extremity.  - PT/OT eval and treat   - OOB w/ assistance only  - B12 & folate labs   Concerning Home Situation  Hx Dementia  Language Barrier  History extremely limited as daughter does not live with patient and has not visited in several weeks. Per chart review, patient is supposed to have 24 hour care in the setting of dementia. [facility] but without nurse care per daughter. Daughter reports that patient was brought to ED by her husband (who does not speak vietnamese). Daughter is able to understand patient and reports that she speaks to patient on the phone daily. Patient's phone died in the ED while speaking to daughter (daughter is listed in his phone as "My Daughter"). If issues with understanding patient, try to find charger for patient to call daughter. Apparently, she was not picking up phone for ED provider. Daughter provided phone number: [phone].  - PT/OT eval and treat  - anticipate discharge to SNF.  - Will consult TOC after PT/OT recs   Anemia, macrocytic Hemoglobin 9.5 on admission with MCV 102.2. Patient has dried blood in his R nares on exam, so could be secondary to epistaxis. Also reports blood in stool yesterday and is FOBT+, so GI bleed remains high on the differential.  -am CBC -Continue to monitor for bloody stools or other source of bleeding -B12/folate levels given macrocytosis -Will start PPI therapy as above for possible GI bleed  Tachycardia Patient has been intermittently tachycardic on the monitor. Could be related to acute pain or anemia, but will consider cardiac causes as well. -Will obtain troponins, echo, and repeat 12 lead EKG -Continue to monitor   CKD III  GFR 40's at baseline. Creatinine 1.5 at baseline. Received IV 40 lasix in ED with 1.3L diruesis. Likely will have  Cr bump in AM.  - AM BMP  - Avoid other neprhotoxic meds   Hx of prostate cancer S/p radiation   FEN/GI: NPO Prophylaxis: Lovenox  Disposition: Med-Tele  History of Present Illness:  [name] is a 74 y.o. male presenting with several days of bilateral leg swelling and abdominal pain. History is significantly limited as patient is vietnamese speaking only, and his speech is unintelligible for interpreters because patient is edentulous.  Limited history was obtained via telephone with patient's daughter and via video interpreter. Patient has had several days of bilateral lower extremity edema and abdominal pain. His leg swelling reportedly started after he took azithromycin, which was prescribed from an outside hospital. He also complains of abdominal pain, mostly left upper abdomen. He reports blood in his stool yesterday.  Daughter states patient has fallen twice, but he lives alone and daughter has not seen him in over a month.  Review Of Systems: Per HPI with the following additions:   Review of Systems  Reason unable to perform ROS: Patient edentulous and ipad interpretor could not understand. Limited ROS obtained.  Respiratory: Negative for cough and shortness of breath.   Cardiovascular: Positive for leg swelling. Negative for chest pain.  Gastrointestinal: Positive for abdominal pain and blood in stool. Negative for vomiting.  ROS limited due to patient's unintelligible speech  Patient Active Problem List   Diagnosis Date Noted  .  Leg swelling 01/26/2020  . Abdominal pain   . Acute pancreatitis   . Dysuria 11/18/2017  . History of colonic polyps 10/01/2015  . Moderate dementia without behavioral disturbance ([place]) 03/22/2015  . Right-sided low back pain with right-sided sciatica 11/30/2014  . Neuropathy 11/30/2014  . Vitamin D insufficiency 07/09/2014  . Right-sided low back pain without sciatica 07/06/2014  . Memory loss 06/24/2014  . Anemia of chronic disease  05/15/2014  . Internal hemorrhoids with complication 48/54/6270  . Degenerative disc disease, lumbar 05/06/2014  . Foraminal stenosis of lumbar region 05/06/2014  . Depression 12/13/2013  . Prostate cancer ([place]) 12/13/2013  . HYPERCHOLESTEROLEMIA, MILD 05/19/2010  . 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 Constipation 09/23/2009  . BPH with urinary obstruction 09/23/2009  . HEADACHE 09/23/2009    Past Medical History: Past Medical History:  Diagnosis Date  . Adenomatous polyp of colon 06/2010   4 polyps removed at colonoscopy by Dr Henrene Pastor  . Anxiety   . Chronic headaches   . Depression   . Diverticulosis of colon 08/2010  . Internal hemorrhoids 08/2010  . Prostate cancer [facility])    receiving radiation treatment  . Subdural hematoma, chronic ([place])   . Tuberculosis     Past Surgical History: Past Surgical History:  Procedure Laterality Date  . FLEXIBLE SIGMOIDOSCOPY  03/29/2012   Procedure: FLEXIBLE SIGMOIDOSCOPY;  Surgeon: Inda Castle, MD;  Location: [facility];  Service: Endoscopy;  Laterality: N/A;  possibly may do colonoscopy but prep is only for a flex  . FLEXIBLE SIGMOIDOSCOPY  03/31/2012   Procedure: FLEXIBLE SIGMOIDOSCOPY;  Surgeon: Inda Castle, MD;  Location: [facility];  Service: Endoscopy;  Laterality: N/A;  . HEMORRHOID BANDING  03/31/2012   Procedure: HEMORRHOID BANDING;  Surgeon: Inda Castle, MD;  Location: [facility];  Service: Endoscopy;  Laterality: N/A;  . MULTIPLE EXTRACTIONS WITH ALVEOLOPLASTY N/A 08/08/2013   Procedure: MULTIPLE EXTRACTION OF TEETH #1, 2, 3, 6, 7, 9, 11, 15, 16, 17, 20, 21, 22, 23, 24, 26, 29, 30, 32 WITH ALVEOLOPLASTY AND REMOVAL BUCCAL EXOSTOSIS LEFT MAXILLA;  Surgeon: Gae Bon, DDS;  Location: [facility];  Service: Oral Surgery;  Laterality: N/A;  . shrapnel removal     skull during [place] War    Social History: Social History   Tobacco Use  . Smoking status: Former Smoker    Packs/day: 0.25    Years: 5.00    Pack years:  1.25    Types: Cigarettes    Quit date: 03/16/1992    Years since quitting: 27.8  . Smokeless tobacco: Never Used  Vaping Use  . Vaping Use: Never assessed  Substance Use Topics  . Alcohol use: No    Alcohol/week: 0.0 standard drinks  . Drug use: No    Family History: Unknown.  Allergies and Medications: No Known Allergies Current Facility-Administered Medications on File Prior to Encounter  Medication Dose Route Frequency Provider Last Rate Last Admin  . 0.9 %  sodium chloride infusion  500 mL Intravenous Continuous Irene Shipper, MD       Current Outpatient Medications on File Prior to Encounter  Medication Sig Dispense Refill  . azithromycin (ZITHROMAX) 250 MG tablet Take 250-500 mg by mouth as directed.    . pantoprazole (PROTONIX) 40 MG tablet Take 40 mg by mouth in the morning and at bedtime.    Marland Kitchen amoxicillin (AMOXIL) 500 MG capsule Take 2 capsules (1,000 mg total) by mouth 2 (two) times daily. (Patient not taking: Reported on 01/26/2020)  40 capsule 0  . guaiFENesin (MUCINEX) 600 MG 12 hr tablet Take 1 tablet (600 mg total) by mouth 2 (two) times daily. (Patient not taking: Reported on 01/26/2020) 6 tablet 0    Objective: BP 98/87   Pulse 92   Temp 98.7 F (37.1 C) (Oral)   Resp 19   SpO2 98%  Exam: General: alert, NAD ENTM: dry mucous membranes, dried blood in R nares Neck: supple Cardiovascular: RRR, normal S1/S2 without m/r/g Respiratory: normal WOB on room air, lungs CTAB Gastrointestinal: firm but nondistended, tender to palpation in LUQ Ext: 2+ pitting edema of bilateral lower extremities up to thigh Derm: small ecchymosis on L lower back, abrasion on R elbow, small scattered abrasions over bilateral anterior shins. Anterior shins warm to touch but no confluent erythema.  Neuro: moves all extremities spontaneously. No FND or CN deficits appreciated. Difficult to assess remainder of neuro exam   Labs and Imaging: CBC BMET  Recent Labs  Lab 01/26/20 1017   WBC 5.3  HGB 9.5*  HCT 28.0*  PLT 301   Recent Labs  Lab 01/26/20 1017  NA 137  K 4.7  CL 108  CO2 21*  BUN 35*  CREATININE 1.73*  GLUCOSE 92  CALCIUM 8.8*     EKG: NSR at 78. Rightward axis. RBBB. Normal intervals.  CT ABDOMEN PELVIS W CONTRAST Result Date: 01/26/2020 IMPRESSION: 1. 8 mm enhancing area lateral left kidney persists immediately adjacent to a cyst. Nonemergent MR to further evaluate this area remains advisable. A small neoplasm in this area cannot be excluded on this study. 2. Asymmetric thickening of the internal oblique muscle on the left laterally compared to the right. Significance of this finding uncertain. No hematoma or mass seen within this area of muscle asymmetry. 3. No bowel wall thickening or bowel obstruction. Scattered colonic diverticula without diverticulitis. No free air. Appendix appears normal. No abscess in the abdomen or pelvis. 4.  Sizable hiatal hernia remains. 5.  Aortic Atherosclerosis (ICD10-I70.0). 6. No renal or ureteral calculus. No hydronephrosis. Urinary bladder wall thickness normal.    VAS [name] LOWER EXTREMITY VENOUS (DVT) (ONLY [facility] & [facility]) Result Date: 01/26/2020 Summary:  Limitations: Body habitus, poor ultrasound/tissue interface and contracted  legs.  RIGHT: - There is no evidence of deep vein thrombosis in the lower extremity. However, portions of this examination were limited- see technologist comments above.  - No cystic structure found in the popliteal fossa.   LEFT: - There is no evidence of deep vein thrombosis in the lower extremity. However, portions of this examination were limited- see technologist comments above.  - No cystic structure found in the popliteal fossa.      Alcus Dad, MD 01/26/2020, 5:39 PM PGY-1, [place] Intern pager: [phone], text pages welcome  FPTS Upper-Level Resident Addendum I have independently interviewed and examined the patient. I have discussed the above with  the original author and agree with their documentation. My edits for correction/addition/clarification are in - purple. Please see also any attending notes.  [place] Service pager: [phone] (text pages welcome through [facility])  Wilber Oliphant, M.D.  PGY-3 01/26/2020 8:29 PM

## 2020-01-26 NOTE — ED Notes (Signed)
Pt transported to vascular.  °

## 2020-01-26 NOTE — ED Provider Notes (Signed)
[facility] EMERGENCY DEPARTMENT Provider Note   CSN: [id number] Arrival date & time: 01/26/20  9476     History Chief Complaint  Patient presents with  . Leg Pain    [name] is a 74 y.o. male.  Multiple complaints to include abdominal pain leg swelling leg pain. Been going on for several days. Family feels that this is related to the azithromycin he was discharged on for cough congestion. Was seen in another hospital, had a CT scan at that time was negative. Did use an interpreter however the patient has no teeth makes it very difficult to understand, his family says this is his baseline. Our interpreter cannot understand him. We resorted to yes/no questions he had difficulty with this table. From what I can gather he has abdominal pain he has had red stools, and the leg swelling is getting worse of the last few days.  The history is limited by a language barrier. A language interpreter was used.       Past Medical History:  Diagnosis Date  . Adenomatous polyp of colon 06/2010   4 polyps removed at colonoscopy by Dr Henrene Pastor  . Anxiety   . Chronic headaches   . Depression   . Diverticulosis of colon 08/2010  . Internal hemorrhoids 08/2010  . Prostate cancer [name])    receiving radiation treatment  . Subdural hematoma, chronic ([place])   . Tuberculosis     Patient Active Problem List   Diagnosis Date Noted  . Dysuria 11/18/2017  . History of colonic polyps 10/01/2015  . Moderate dementia without behavioral disturbance ([place]) 03/22/2015  . Right-sided low back pain with right-sided sciatica 11/30/2014  . Neuropathy 11/30/2014  . Vitamin D insufficiency 07/09/2014  . Right-sided low back pain without sciatica 07/06/2014  . Memory loss 06/24/2014  . Anemia of chronic disease 05/15/2014  . Internal hemorrhoids with complication 54/65/0354  . Degenerative disc disease, lumbar 05/06/2014  . Foraminal stenosis of lumbar region 05/06/2014  . Depression  12/13/2013  . Prostate cancer ([place]) 12/13/2013  . HYPERCHOLESTEROLEMIA, MILD 05/19/2010  . 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 BPH with urinary obstruction 09/23/2009  . HEADACHE 09/23/2009    Past Surgical History:  Procedure Laterality Date  . FLEXIBLE SIGMOIDOSCOPY  03/29/2012   Procedure: FLEXIBLE SIGMOIDOSCOPY;  Surgeon: Inda Castle, MD;  Location: [place];  Service: Endoscopy;  Laterality: N/A;  possibly may do colonoscopy but prep is only for a flex  . FLEXIBLE SIGMOIDOSCOPY  03/31/2012   Procedure: FLEXIBLE SIGMOIDOSCOPY;  Surgeon: Inda Castle, MD;  Location: [place];  Service: Endoscopy;  Laterality: N/A;  . HEMORRHOID BANDING  03/31/2012   Procedure: HEMORRHOID BANDING;  Surgeon: Inda Castle, MD;  Location: [place];  Service: Endoscopy;  Laterality: N/A;  . MULTIPLE EXTRACTIONS WITH ALVEOLOPLASTY N/A 08/08/2013   Procedure: MULTIPLE EXTRACTION OF TEETH #1, 2, 3, 6, 7, 9, 11, 15, 16, 17, 20, 21, 22, 23, 24, 26, 29, 30, 32 WITH ALVEOLOPLASTY AND REMOVAL BUCCAL EXOSTOSIS LEFT MAXILLA;  Surgeon: Gae Bon, DDS;  Location: [place];  Service: Oral Surgery;  Laterality: N/A;  . shrapnel removal     skull during [place] War       No family history on file.  Social History   Tobacco Use  . Smoking status: Former Smoker    Packs/day: 0.25    Years: 5.00    Pack years: 1.25    Types: Cigarettes    Quit date: 03/16/1992  Years since quitting: 27.8  . Smokeless tobacco: Never Used  Vaping Use  . Vaping Use: Never assessed  Substance Use Topics  . Alcohol use: No    Alcohol/week: 0.0 standard drinks  . Drug use: No    Home Medications Prior to Admission medications   Medication Sig Start Date End Date Taking? Authorizing Provider  amoxicillin (AMOXIL) 500 MG capsule Take 2 capsules (1,000 mg total) by mouth 2 (two) times daily. 05/25/18   Deno Etienne, DO  guaiFENesin (MUCINEX) 600 MG 12 hr tablet Take 1 tablet (600 mg total) by mouth 2 (two) times  daily. 10/21/17   Couture, Cortni S, PA-C  UNKNOWN TO PATIENT Medication for Liver    [provider]    Allergies    Patient has no known allergies.  Review of Systems   Review of Systems  Constitutional: Negative for chills and fever.  HENT: Negative for congestion and rhinorrhea.   Respiratory: Negative for cough and shortness of breath.   Cardiovascular: Positive for leg swelling. Negative for chest pain and palpitations.  Gastrointestinal: Positive for abdominal pain and blood in stool. Negative for diarrhea, nausea and vomiting.  Genitourinary: Negative for difficulty urinating and dysuria.  Musculoskeletal: Negative for arthralgias and back pain.  Skin: Negative for color change and rash.  Neurological: Negative for light-headedness and headaches.    Physical Exam Updated Vital Signs BP 111/83   Pulse 98   Temp 98.7 F (37.1 C) (Oral)   Resp 18   SpO2 100%   Physical Exam Vitals and nursing note reviewed.  Constitutional:      General: He is not in acute distress.    Appearance: Normal appearance.  HENT:     Head: Normocephalic and atraumatic.     Nose: No rhinorrhea.  Eyes:     General:        Right eye: No discharge.        Left eye: No discharge.     Conjunctiva/sclera: Conjunctivae normal.  Cardiovascular:     Rate and Rhythm: Normal rate and regular rhythm.  Pulmonary:     Effort: Pulmonary effort is normal. No respiratory distress.     Breath sounds: No stridor. No wheezing.  Abdominal:     General: Abdomen is flat. There is no distension.     Palpations: Abdomen is soft.     Tenderness: There is abdominal tenderness. There is guarding.  Genitourinary:    Comments: No melena or bright red blood per rectum on exam Musculoskeletal:        General: Swelling present. No tenderness, deformity or signs of injury.     Right lower leg: Edema present.     Left lower leg: Edema present.     Comments: Warm to touch good capillary refill, intact pulses  in the lower extremities  Skin:    General: Skin is warm and dry.  Neurological:     General: No focal deficit present.     Mental Status: He is alert. Mental status is at baseline.     Motor: No weakness.  Psychiatric:        Mood and Affect: Mood normal.        Behavior: Behavior normal.        Thought Content: Thought content normal.     ED Results / Procedures / Treatments   Labs (all labs ordered are listed, but only abnormal results are displayed) Labs Reviewed  COMPREHENSIVE METABOLIC PANEL - Abnormal; Notable for the following components:  Result Value   CO2 21 (*)    BUN 35 (*)    Creatinine, Ser 1.73 (*)    Calcium 8.8 (*)    Albumin 3.3 (*)    AST 50 (*)    GFR, Estimated 41 (*)    All other components within normal limits  CBC WITH DIFFERENTIAL/PLATELET - Abnormal; Notable for the following components:   RBC 2.74 (*)    Hemoglobin 9.5 (*)    HCT 28.0 (*)    MCV 102.2 (*)    MCH 34.7 (*)    nRBC 0.6 (*)    All other components within normal limits  URINALYSIS, ROUTINE W REFLEX MICROSCOPIC - Abnormal; Notable for the following components:   Color, Urine STRAW (*)    All other components within normal limits  LIPASE, BLOOD - Abnormal; Notable for the following components:   Lipase 108 (*)    All other components within normal limits  RESPIRATORY PANEL BY RT PCR (FLU A&B, COVID)  LACTIC ACID, PLASMA  BRAIN NATRIURETIC PEPTIDE    EKG None  Radiology CT ABDOMEN PELVIS W CONTRAST  Result Date: 01/26/2020 CLINICAL DATA:  Lower extremity edema and abdominal pain EXAM: CT ABDOMEN AND PELVIS WITH CONTRAST TECHNIQUE: Multidetector CT imaging of the abdomen and pelvis was performed using the standard protocol following bolus administration of intravenous contrast. CONTRAST:  145mL OMNIPAQUE IOHEXOL 300 MG/ML  SOLN COMPARISON:  CT abdomen and pelvis January 16, 2020 FINDINGS: Lower chest: There is bibasilar atelectasis. No lung base edema or consolidation.  There is a sizable hiatal hernia, stable. Hepatobiliary: There is a 7 mm cyst immediately to the left of the left portal vein within the liver. No other focal liver lesions are evident. Gallbladder appears upper normal in size without appreciable gallbladder wall thickening. There is no biliary duct dilatation. Pancreas: There is no pancreatic mass or inflammatory focus. Spleen: No splenic lesions are evident. Adrenals/Urinary Tract: Adrenals bilaterally appear normal. Renal cysts are noted bilaterally, stable. The previously noted focus of enhancement immediately adjacent to a cyst along the lateral left kidney measuring 8 mm is best seen on coronal slice 42 series 6 and is unchanged from recent prior study. No hydronephrosis is appreciable on either side. There is a 4 x 2 mm calculus in the lower pole of the left kidney. There is a 2 mm calculus in the upper pole of the left kidney. There is no evident ureteral calculus on either side. Urinary bladder is midline with wall thickness within normal limits. Stomach/Bowel: There is moderate stool in the colon. There is no appreciable bowel wall or mesenteric thickening. Scattered colonic diverticula, most notably in the cecal region without diverticular inflammation. No appreciable bowel obstruction. Terminal ileum appears normal. There is no evident free air or portal venous air. Vascular/Lymphatic: No abdominal aortic aneurysm. There is aortic and iliac artery atherosclerosis. Major venous structures appear patent. No evident adenopathy in the abdomen or pelvis. Reproductive: There are surgical clips posterior to the prostate, stable. Prostate and seminal vesicles appear normal in size and contour. Other: Appendix appears normal no evident abscess or ascites in the abdomen or pelvis. Musculoskeletal: There is stable relative thickening of the left lateral internal oblique muscle compared to its counterpart on the right. The attenuation in this muscle appears normal  without obvious hematoma or mass. No other muscle asymmetry is evident. There is degenerative change in the lumbar spine. No blastic or lytic bone lesions are evident. Note that there is lower lumbar levoscoliosis. IMPRESSION:  1. 8 mm enhancing area lateral left kidney persists immediately adjacent to a cyst. Nonemergent MR to further evaluate this area remains advisable. A small neoplasm in this area cannot be excluded on this study. 2. Asymmetric thickening of the internal oblique muscle on the left laterally compared to the right. Significance of this finding uncertain. No hematoma or mass seen within this area of muscle asymmetry. 3. No bowel wall thickening or bowel obstruction. Scattered colonic diverticula without diverticulitis. No free air. Appendix appears normal. No abscess in the abdomen or pelvis. 4.  Sizable hiatal hernia remains. 5.  Aortic Atherosclerosis (ICD10-I70.0). 6. No renal or ureteral calculus. No hydronephrosis. Urinary bladder wall thickness normal. Electronically Signed   By: Lowella Grip III M.D.   On: 01/26/2020 14:48   VAS [name] LOWER EXTREMITY VENOUS (DVT) (ONLY [facility] & [facility])  Result Date: 01/26/2020  Lower Venous DVT Study Indications: Swelling.  Limitations: Body habitus, poor ultrasound/tissue interface and contracted legs. Comparison Study: No prior study Performing Technologist: Maudry Mayhew MHA, RDMS, RVT, RDCS  Examination Guidelines: A complete evaluation includes B-mode imaging, spectral Doppler, color Doppler, and power Doppler as needed of all accessible portions of each vessel. Bilateral testing is considered an integral part of a complete examination. Limited examinations for reoccurring indications may be performed as noted. The reflux portion of the exam is performed with the patient in reverse Trendelenburg.  +---------+---------------+---------+-----------+----------+--------------+ RIGHT    CompressibilityPhasicitySpontaneityPropertiesThrombus Aging  +---------+---------------+---------+-----------+----------+--------------+ CFV                     Yes      Yes        patent                   +---------+---------------+---------+-----------+----------+--------------+ SFJ                              Yes        patent                   +---------+---------------+---------+-----------+----------+--------------+ FV Prox                          Yes        patent                   +---------+---------------+---------+-----------+----------+--------------+ FV Mid                           Yes        patent                   +---------+---------------+---------+-----------+----------+--------------+ FV Distal                        Yes                                 +---------+---------------+---------+-----------+----------+--------------+ POP      Full           Yes      Yes        patent                   +---------+---------------+---------+-----------+----------+--------------+   Right Technical Findings: Not visualized segments include PFV, PTV, peroneal veins.  +---------+---------------+---------+-----------+----------+--------------+ LEFT  CompressibilityPhasicitySpontaneityPropertiesThrombus Aging +---------+---------------+---------+-----------+----------+--------------+ FV Mid   Full                    Yes        patent                   +---------+---------------+---------+-----------+----------+--------------+ FV Distal                        Yes        patent                   +---------+---------------+---------+-----------+----------+--------------+ POP      Full           Yes      Yes        patent                   +---------+---------------+---------+-----------+----------+--------------+ PTV      Full                               patent                   +---------+---------------+---------+-----------+----------+--------------+ PERO     Full                                patent                   +---------+---------------+---------+-----------+----------+--------------+   Left Technical Findings: Not visualized segments include CFV, SFJ, FV proximal, PFV,.   Summary: RIGHT: - There is no evidence of deep vein thrombosis in the lower extremity. However, portions of this examination were limited- see technologist comments above.  - No cystic structure found in the popliteal fossa.  LEFT: - There is no evidence of deep vein thrombosis in the lower extremity. However, portions of this examination were limited- see technologist comments above.  - No cystic structure found in the popliteal fossa.  *See table(s) above for measurements and observations.    Preliminary     Procedures Procedures (including critical care time)  Medications Ordered in ED Medications  furosemide (LASIX) injection 40 mg (40 mg Intravenous Given 01/26/20 1234)  iohexol (OMNIPAQUE) 300 MG/ML solution 100 mL (100 mLs Intravenous Contrast Given 01/26/20 1429)    ED Course  I have reviewed the triage vital signs and the nursing notes.  Pertinent labs & imaging results that were available during my care of the patient were reviewed by me and considered in my medical decision making (see chart for details).    MDM Rules/Calculators/A&P                          Concern for lower extremity swelling, lab studies fairly unremarkable other than a significant drop in hemoglobin from 11.9->9.5. Claims to have red stools. Significant abdominal pain with CT scan, review of the chart shows previous CT scan without any acute findings. Creatinine slightly elevated from previous, I am avoiding IV hydration at this time because he has significant extravascular volume overload in his lower extremities, he needs diuresis for this.. BUN elevated as well. Other labs fairly unremarkable to include a negative lactic acid.  Lab studies remain unremarkable still waiting for BNP, CT imaging was reviewed  again shows no significant findings that are acute or  likely contribute.  I consulted the family medicine doctors for assistance, for further evaluation and investigation.  He has potentially blood loss anemia from GI bleed.  He has this new edema, his family also states he is having trouble swallowing so I added a CT scan of his head that is also pending.  Final Clinical Impression(s) / ED Diagnoses Final diagnoses:  Decreased hemoglobin  Leg swelling  Dysphagia, unspecified type    Rx / DC Orders ED Discharge Orders    None       Breck Coons, MD 01/26/20 [id number]

## 2020-01-26 NOTE — Progress Notes (Signed)
PT resulting mews after he had settled down is now green.     01/26/20 1920  Assess: MEWS Score  Temp 97.7 F (36.5 C)  BP 92/63  Pulse Rate (!) 115  Resp 20  Level of Consciousness Alert  SpO2 100 %  O2 Device Room Air  Assess: MEWS Score  MEWS Temp 0  MEWS Systolic 1  MEWS Pulse 2  MEWS RR 0  MEWS LOC 0  MEWS Score 3  MEWS Score Color Yellow  Assess: if the MEWS score is Yellow or Red  Were vital signs taken at a resting state? No  Focused Assessment No change from prior assessment  Early Detection of Sepsis Score *See Row Information* Low  MEWS guidelines implemented *See Row Information* No, vital signs rechecked  Treat  MEWS Interventions Other (Comment) (charge notified)  Pain Scale PAINAD  Take Vital Signs  Increase Vital Sign Frequency  Yellow: Q 2hr X 2 then Q 4hr X 2, if remains yellow, continue Q 4hrs (Will cont q4, pt is new and follwoing mews is green)  Notify: Charge Nurse/RN  Name of Charge Nurse/RN Notified Jaquetta  Date Charge Nurse/RN Notified 01/26/20  Time Charge Nurse/RN Notified 2010

## 2020-01-26 NOTE — Progress Notes (Signed)
Bilateral lower extremity venous duplex completed. Refer to "CV Proc" under chart review to view preliminary results.  01/26/2020 1:12 PM Kelby Aline., MHA, RVT, RDCS, RDMS

## 2020-01-26 NOTE — ED Notes (Signed)
Pt has 3 edema of lower extremities bilat. Pt has 2+ pedal pulse bilat, cap refill less than 3 sec, warm to touch.

## 2020-01-26 NOTE — ED Triage Notes (Addendum)
Pt brought to hospital by family, daughter reports that patient's legs have been swollen and painful  x3 days after taking a new medication (possibly zithromax?) that he was given while at [facility] on 11/2

## 2020-01-26 NOTE — ED Notes (Signed)
Patient needs interpretor, attempted to utilize interpretor ipad but patient and interpretor were unable to communicate. Please call patient's daughter [name] in chart for interpretation.

## 2020-01-27 ENCOUNTER — Inpatient Hospital Stay (HOSPITAL_COMMUNITY): Payer: Medicare Other

## 2020-01-27 DIAGNOSIS — K5900 Constipation, unspecified: Secondary | ICD-10-CM | POA: Diagnosis not present

## 2020-01-27 DIAGNOSIS — R9431 Abnormal electrocardiogram [ECG] [EKG]: Secondary | ICD-10-CM

## 2020-01-27 DIAGNOSIS — D509 Iron deficiency anemia, unspecified: Secondary | ICD-10-CM | POA: Diagnosis not present

## 2020-01-27 DIAGNOSIS — R109 Unspecified abdominal pain: Secondary | ICD-10-CM | POA: Diagnosis not present

## 2020-01-27 DIAGNOSIS — K5521 Angiodysplasia of colon with hemorrhage: Secondary | ICD-10-CM | POA: Diagnosis not present

## 2020-01-27 DIAGNOSIS — M7989 Other specified soft tissue disorders: Secondary | ICD-10-CM | POA: Diagnosis not present

## 2020-01-27 DIAGNOSIS — K921 Melena: Secondary | ICD-10-CM

## 2020-01-27 LAB — CBC WITH DIFFERENTIAL/PLATELET
Abs Immature Granulocytes: 0.02 10*3/uL (ref 0.00–0.07)
Basophils Absolute: 0.1 10*3/uL (ref 0.0–0.1)
Basophils Relative: 1 %
Eosinophils Absolute: 0.1 10*3/uL (ref 0.0–0.5)
Eosinophils Relative: 3 %
HCT: 28.5 % — ABNORMAL LOW (ref 39.0–52.0)
Hemoglobin: 9.7 g/dL — ABNORMAL LOW (ref 13.0–17.0)
Immature Granulocytes: 0 %
Lymphocytes Relative: 28 %
Lymphs Abs: 1.4 10*3/uL (ref 0.7–4.0)
MCH: 33.9 pg (ref 26.0–34.0)
MCHC: 34 g/dL (ref 30.0–36.0)
MCV: 99.7 fL (ref 80.0–100.0)
Monocytes Absolute: 0.5 10*3/uL (ref 0.1–1.0)
Monocytes Relative: 10 %
Neutro Abs: 2.9 10*3/uL (ref 1.7–7.7)
Neutrophils Relative %: 58 %
Platelets: 294 10*3/uL (ref 150–400)
RBC: 2.86 MIL/uL — ABNORMAL LOW (ref 4.22–5.81)
RDW: 12.2 % (ref 11.5–15.5)
WBC: 5 10*3/uL (ref 4.0–10.5)
nRBC: 0 % (ref 0.0–0.2)

## 2020-01-27 LAB — COMPREHENSIVE METABOLIC PANEL
ALT: 38 U/L (ref 0–44)
AST: 47 U/L — ABNORMAL HIGH (ref 15–41)
Albumin: 3.3 g/dL — ABNORMAL LOW (ref 3.5–5.0)
Alkaline Phosphatase: 83 U/L (ref 38–126)
Anion gap: 8 (ref 5–15)
BUN: 35 mg/dL — ABNORMAL HIGH (ref 8–23)
CO2: 23 mmol/L (ref 22–32)
Calcium: 9 mg/dL (ref 8.9–10.3)
Chloride: 106 mmol/L (ref 98–111)
Creatinine, Ser: 2.08 mg/dL — ABNORMAL HIGH (ref 0.61–1.24)
GFR, Estimated: 33 mL/min — ABNORMAL LOW (ref >60)
Glucose, Bld: 85 mg/dL (ref 70–99)
Potassium: 4.7 mmol/L (ref 3.5–5.1)
Sodium: 137 mmol/L (ref 135–145)
Total Bilirubin: 1.3 mg/dL — ABNORMAL HIGH (ref 0.3–1.2)
Total Protein: 6.6 g/dL (ref 6.5–8.1)

## 2020-01-27 LAB — VITAMIN B12: Vitamin B-12: 437 pg/mL (ref 180–914)

## 2020-01-27 LAB — ECHOCARDIOGRAM COMPLETE
Area-P 1/2: 3.27 cm2
S' Lateral: 2.13 cm
Weight: 2105.83 oz

## 2020-01-27 LAB — HEMOGLOBIN A1C
Hgb A1c MFr Bld: 4.9 % (ref 4.8–5.6)
Mean Plasma Glucose: 93.93 mg/dL

## 2020-01-27 LAB — FOLATE: Folate: 14.6 ng/mL (ref >5.9)

## 2020-01-27 MED ORDER — PEG-KCL-NACL-NASULF-NA ASC-C 100 G PO SOLR
1.0000 | Freq: Once | ORAL | Status: AC
  Administered 2020-01-27: 200 g via ORAL
  Filled 2020-01-27: qty 1

## 2020-01-27 NOTE — Consult Note (Addendum)
Consultation  Referring Provider: Dr. Gwendlyn Deutscher    Primary Care Physician:  Matilde Haymaker, MD Primary Gastroenterologist: Dr. Henrene Pastor        Reason for Consultation: Rectal bleeding             HPI:   [name] is a 74 y.o. male with a past medical history of dementia, frequent falls, chronic subdural hematoma, CKD stage III and oropharyngeal dysphagia, who presented to the ER on 01/26/2020 with a complaint of lower extremity edema and abdominal pain.    At time of presentation it was thought some of patient's abdominal pain was from constipation.  He had a CT abdomen pelvis obtained in the ED which showed asymmetric thickening of the internal oblique muscle on the left and an 8 mm enhancing area in the lateral left kidney unchanged from prior.  As well as no bowel wall thickening but scattered colonic diverticula without diverticulitis.  Also moderate stool.  Sizable hiatal hernia.  Patient was given MiraLAX and senna for constipation.Hemoglobin was 11.9 on 05/25/2018 and noted to be 9.5 at time of admission yesterday--> 9.7 overnight.  BUN minimally elevated at 35.      Today patient is seen with the assistance of a [place] interpreter, even with the interpreter history is hard to garnished as patient does have a history of dementia and multiple times the interpreter says "I do not know what he just said".  Per nursing report patient finally had a stool today and when passing the very hard constipated stool, there was some bright red blood seen, but patient did not seem in any pain even when this was happening more afterwards.  Patient does report some intermittent bright red blood per rectum but we cannot tell for how long.    When asked about abdominal pain he says "it is better now but still there".    Also describes some history of reflux but "I was given medicine at its better".    Denies fever, chills, weight loss, nausea or vomiting.     GI history:  01/16/2020 patient seen in the ED in  [facility] for sore throat and abdominal pain that had started 3 days prior, also reported some nausea and shortness of breath, at that time patient started on a PPI due to a newly productive cough  11/05/2015 EGD Dr. Henrene Pastor done for dyspepsia: Impression: - Non-severe reflux esophagitis. - Gastritis. Biopsied. - Normal examined duodenum. Recommendations: - Prescribe omeprazole 40 mg daily; #30; 11 refills. - Resume previous diet. - Treat for Helicobacter pylori if positive CLO. - Return to the care of your primary provider.  11/05/2015 colonoscopy Dr. Henrene Pastor done for personal history of adenomatous polyps 5 days prior:  Impression: - Five 3 to 5 mm polyps in the sigmoid colon, in the descending colon, in the ascending colon and in the cecum, removed with a cold snare. Resected and retrieved. - Diverticulosis in the cecum and in the right colon. - The entire examined colon is normal otherwise except for mild radiation proctitis. Recommendations: - Repeat colonoscopy in 3 - 5 years for surveillance. - EGD today please see report. - Continue present medications. -Pathology results showed tubular adenomas and patient was recommended to have repeat in 3 years   Past Medical History:  Diagnosis Date  . Adenomatous polyp of colon 06/2010   4 polyps removed at colonoscopy by Dr Henrene Pastor  . Anxiety   . Chronic headaches   . Depression   . Diverticulosis  of colon 08/2010  . Internal hemorrhoids 08/2010  . Prostate cancer [facility])    receiving radiation treatment  . Subdural hematoma, chronic ([place])   . Tuberculosis     Past Surgical History:  Procedure Laterality Date  . FLEXIBLE SIGMOIDOSCOPY  03/29/2012   Procedure: FLEXIBLE SIGMOIDOSCOPY;  Surgeon: Inda Castle, MD;  Location: [place];  Service: Endoscopy;  Laterality: N/A;  possibly may do colonoscopy but prep is only for a flex  . FLEXIBLE SIGMOIDOSCOPY  03/31/2012   Procedure: FLEXIBLE SIGMOIDOSCOPY;  Surgeon: Inda Castle, MD;   Location: [place];  Service: Endoscopy;  Laterality: N/A;  . HEMORRHOID BANDING  03/31/2012   Procedure: HEMORRHOID BANDING;  Surgeon: Inda Castle, MD;  Location: [place];  Service: Endoscopy;  Laterality: N/A;  . MULTIPLE EXTRACTIONS WITH ALVEOLOPLASTY N/A 08/08/2013   Procedure: MULTIPLE EXTRACTION OF TEETH #1, 2, 3, 6, 7, 9, 11, 15, 16, 17, 20, 21, 22, 23, 24, 26, 29, 30, 32 WITH ALVEOLOPLASTY AND REMOVAL BUCCAL EXOSTOSIS LEFT MAXILLA;  Surgeon: Gae Bon, DDS;  Location: [place];  Service: Oral Surgery;  Laterality: N/A;  . shrapnel removal     skull during [place] War    Family History: No GI history  Social History   Tobacco Use  . Smoking status: Former Smoker    Packs/day: 0.25    Years: 5.00    Pack years: 1.25    Types: Cigarettes    Quit date: 03/16/1992    Years since quitting: 27.8  . Smokeless tobacco: Never Used  Vaping Use  . Vaping Use: Never assessed  Substance Use Topics  . Alcohol use: No    Alcohol/week: 0.0 standard drinks  . Drug use: No    Prior to Admission medications   Medication Sig Start Date End Date Taking? Authorizing Provider  azithromycin (ZITHROMAX) 250 MG tablet Take 250-500 mg by mouth as directed. 01/16/20  Yes [provider]  pantoprazole (PROTONIX) 40 MG tablet Take 40 mg by mouth in the morning and at bedtime. 01/16/20 01/30/20 Yes [provider]  amoxicillin (AMOXIL) 500 MG capsule Take 2 capsules (1,000 mg total) by mouth 2 (two) times daily. Patient not taking: Reported on 01/26/2020 05/25/18   Deno Etienne, DO  guaiFENesin (MUCINEX) 600 MG 12 hr tablet Take 1 tablet (600 mg total) by mouth 2 (two) times daily. Patient not taking: Reported on 01/26/2020 10/21/17   Couture, Cortni S, PA-C    Current Facility-Administered Medications  Medication Dose Route Frequency Provider Last Rate Last Admin  . lactated ringers infusion   Intravenous Continuous Wilber Oliphant, MD 50 mL/hr at 01/26/20 1829 New Bag at  01/26/20 1829  . pantoprazole (PROTONIX) injection 40 mg  40 mg Intravenous Q12H Alcus Dad, MD   40 mg at 01/27/20 1144  . polyethylene glycol (MIRALAX / GLYCOLAX) packet 17 g  17 g Oral BID Wilber Oliphant, MD   17 g at 01/27/20 1144  . senna (SENOKOT) tablet 8.6 mg  1 tablet Oral Daily Wilber Oliphant, MD   8.6 mg at 01/27/20 1144    Allergies as of 01/26/2020  . (No Known Allergies)     Review of Systems:    Constitutional: No weight loss, fever or chills Skin: No rash  Cardiovascular: No chest pain Respiratory: No SOB Gastrointestinal: See HPI and otherwise negative Genitourinary: No dysuria  Neurological: No headache, dizziness or syncope Musculoskeletal: No new muscle or joint pain Hematologic: No bruising Psychiatric: No history of  depression or anxiety    Physical Exam:  Vital signs in last 24 hours: Temp:  [97.7 F (36.5 C)-98 F (36.7 C)] 97.7 F (36.5 C) (11/13 0400) Pulse Rate:  [72-115] 104 (11/13 0400) Resp:  [13-22] 20 (11/13 0400) BP: (92-119)/(63-97) 108/97 (11/13 0400) SpO2:  [98 %-100 %] 100 % (11/13 0400) Weight:  [59.7 kg] 59.7 kg (11/13 0400)   General:   Pleasantly demented [place] male appears to be in NAD, Well developed, Well nourished, alert and cooperative Head:  Normocephalic and atraumatic. Eyes:   PEERL, EOMI. No icterus. Conjunctiva pink. Ears:  Normal auditory acuity. Neck:  Supple Throat: Oral cavity and pharynx without inflammation, swelling or lesion.  Lungs: Respirations even and unlabored. Lungs clear to auscultation bilaterally.   No wheezes, crackles, or rhonchi.  Heart: Normal S1, S2. No MRG. Regular rate and rhythm. No peripheral edema, cyanosis or pallor.  Abdomen:  Soft, nondistended, nontender. No rebound or guarding. Normal bowel sounds. No appreciable masses or hepatomegaly. Rectal: External: No abnormality; internal: Tight sphincter, no tenderness, no residue, no mass Msk:  Symmetrical without gross deformities.  Peripheral pulses intact.  Extremities:  Without edema, no deformity or joint abnormality.  Neurologic:  Alert and  oriented x 2;  grossly normal neurologically.  Skin:   Dry and intact without significant lesions or rashes. Psychiatric: Demonstrates good judgement and reason without abnormal affect or behaviors.  Details are foggy, very poor historian   LAB RESULTS: Recent Labs    01/26/20 1017 01/27/20 0443  WBC 5.3 5.0  HGB 9.5* 9.7*  HCT 28.0* 28.5*  PLT 301 294   BMET Recent Labs    01/26/20 1017 01/27/20 0443  NA 137 137  K 4.7 4.7  CL 108 106  CO2 21* 23  GLUCOSE 92 85  BUN 35* 35*  CREATININE 1.73* 2.08*  CALCIUM 8.8* 9.0   LFT Recent Labs    01/27/20 0443  PROT 6.6  ALBUMIN 3.3*  AST 47*  ALT 38  ALKPHOS 83  BILITOT 1.3*    STUDIES: DG Chest 1 View  Result Date: 01/26/2020 CLINICAL DATA:  Fall EXAM: CHEST  1 VIEW COMPARISON:  05/25/2018, 04/30/2018 FINDINGS: No focal opacity or pleural effusion. Stable cardiomediastinal silhouette with right hilar fullness presumably due to vascular summation shadow. Aortic atherosclerosis. No pneumothorax. Chronic right fifth rib fracture IMPRESSION: No active disease. Electronically Signed   By: Donavan Foil M.D.   On: 01/26/2020 22:55   DG Pelvis 1-2 Views  Result Date: 01/26/2020 CLINICAL DATA:  Fall EXAM: PELVIS - 1-2 VIEW COMPARISON:  11/01/2017 FINDINGS: Faint contrast within the urinary bladder. Clips in the region of the prostate. Pubic symphysis and rami appear intact. Both femoral heads project in joint. No fracture or malalignment IMPRESSION: No acute osseous abnormality Electronically Signed   By: Donavan Foil M.D.   On: 01/26/2020 22:46   DG Forearm Left  Result Date: 01/26/2020 CLINICAL DATA:  Fall EXAM: LEFT FOREARM - 2 VIEW COMPARISON:  None. FINDINGS: No acute displaced fracture or malalignment. No significant elbow effusion. Chronic appearing deformity of the first metacarpal IMPRESSION: No acute  osseous abnormality. Electronically Signed   By: Donavan Foil M.D.   On: 01/26/2020 22:51   DG Tibia/Fibula Left  Result Date: 01/26/2020 CLINICAL DATA:  Encounter for fall EXAM: LEFT TIBIA AND FIBULA - 2 VIEW COMPARISON:  05/07/2014 FINDINGS: No fracture or malalignment.  Diffuse soft tissue edema. IMPRESSION: No acute osseous abnormality. Electronically Signed   By: Donavan Foil  M.D.   On: 01/26/2020 22:47   DG Tibia/Fibula Right  Result Date: 01/26/2020 CLINICAL DATA:  Fall, right foreleg pain EXAM: RIGHT TIBIA AND FIBULA - 2 VIEW COMPARISON:  None. FINDINGS: Two view radiograph right foreleg demonstrates normal alignment. No fracture or dislocation. No destructive osseous lesion. There is diffuse subcutaneous edema involving the right foreleg. IMPRESSION: Diffuse subcutaneous edema.  No fracture or dislocation. Electronically Signed   By: Fidela Salisbury MD   On: 01/26/2020 22:46   CT Head Wo Contrast  Result Date: 01/26/2020 CLINICAL DATA:  74 year old male with altered mental status. EXAM: CT HEAD WITHOUT CONTRAST TECHNIQUE: Contiguous axial images were obtained from the base of the skull through the vertex without intravenous contrast. COMPARISON:  Head CT dated 03/22/2019. FINDINGS: Brain: There is mild age-related atrophy and chronic microvascular ischemic changes. Small chronic left frontal subdural collection/hematoma measuring 3 mm in thickness was seen on the prior CT. No new hemorrhage. No mass effect or midline shift. Vascular: No hyperdense vessel or unexpected calcification. Skull: Normal. Negative for fracture or focal lesion. Sinuses/Orbits: No acute finding. Other: None IMPRESSION: 1. Stable small chronic left frontal subdural hemorrhage. No new bleed. 2. Mild age-related atrophy and chronic microvascular ischemic changes. Electronically Signed   By: Anner Crete M.D.   On: 01/26/2020 21:29   CT ABDOMEN PELVIS W CONTRAST  Result Date: 01/26/2020 CLINICAL DATA:  Lower  extremity edema and abdominal pain EXAM: CT ABDOMEN AND PELVIS WITH CONTRAST TECHNIQUE: Multidetector CT imaging of the abdomen and pelvis was performed using the standard protocol following bolus administration of intravenous contrast. CONTRAST:  157mL OMNIPAQUE IOHEXOL 300 MG/ML  SOLN COMPARISON:  CT abdomen and pelvis January 16, 2020 FINDINGS: Lower chest: There is bibasilar atelectasis. No lung base edema or consolidation. There is a sizable hiatal hernia, stable. Hepatobiliary: There is a 7 mm cyst immediately to the left of the left portal vein within the liver. No other focal liver lesions are evident. Gallbladder appears upper normal in size without appreciable gallbladder wall thickening. There is no biliary duct dilatation. Pancreas: There is no pancreatic mass or inflammatory focus. Spleen: No splenic lesions are evident. Adrenals/Urinary Tract: Adrenals bilaterally appear normal. Renal cysts are noted bilaterally, stable. The previously noted focus of enhancement immediately adjacent to a cyst along the lateral left kidney measuring 8 mm is best seen on coronal slice 42 series 6 and is unchanged from recent prior study. No hydronephrosis is appreciable on either side. There is a 4 x 2 mm calculus in the lower pole of the left kidney. There is a 2 mm calculus in the upper pole of the left kidney. There is no evident ureteral calculus on either side. Urinary bladder is midline with wall thickness within normal limits. Stomach/Bowel: There is moderate stool in the colon. There is no appreciable bowel wall or mesenteric thickening. Scattered colonic diverticula, most notably in the cecal region without diverticular inflammation. No appreciable bowel obstruction. Terminal ileum appears normal. There is no evident free air or portal venous air. Vascular/Lymphatic: No abdominal aortic aneurysm. There is aortic and iliac artery atherosclerosis. Major venous structures appear patent. No evident adenopathy in the  abdomen or pelvis. Reproductive: There are surgical clips posterior to the prostate, stable. Prostate and seminal vesicles appear normal in size and contour. Other: Appendix appears normal no evident abscess or ascites in the abdomen or pelvis. Musculoskeletal: There is stable relative thickening of the left lateral internal oblique muscle compared to its counterpart on the right. The  attenuation in this muscle appears normal without obvious hematoma or mass. No other muscle asymmetry is evident. There is degenerative change in the lumbar spine. No blastic or lytic bone lesions are evident. Note that there is lower lumbar levoscoliosis. IMPRESSION: 1. 8 mm enhancing area lateral left kidney persists immediately adjacent to a cyst. Nonemergent MR to further evaluate this area remains advisable. A small neoplasm in this area cannot be excluded on this study. 2. Asymmetric thickening of the internal oblique muscle on the left laterally compared to the right. Significance of this finding uncertain. No hematoma or mass seen within this area of muscle asymmetry. 3. No bowel wall thickening or bowel obstruction. Scattered colonic diverticula without diverticulitis. No free air. Appendix appears normal. No abscess in the abdomen or pelvis. 4.  Sizable hiatal hernia remains. 5.  Aortic Atherosclerosis (ICD10-I70.0). 6. No renal or ureteral calculus. No hydronephrosis. Urinary bladder wall thickness normal. Electronically Signed   By: Lowella Grip III M.D.   On: 01/26/2020 14:48   DG Hand 2 View Left  Result Date: 01/26/2020 CLINICAL DATA:  Fall EXAM: LEFT HAND - 2 VIEW COMPARISON:  None. FINDINGS: No acute displaced fracture or malalignment. Joint space narrowing and degenerative change at the fourth and fifth MCP joints. Chronic appearing deformity of the first metacarpal presumably due to prior trauma. IMPRESSION: No acute osseous abnormality. Chronic appearing deformity of the first metacarpal Electronically  Signed   By: Donavan Foil M.D.   On: 01/26/2020 22:50   DG Humerus Left  Result Date: 01/26/2020 CLINICAL DATA:  Fall EXAM: LEFT HUMERUS - 2+ VIEW COMPARISON:  03/22/2019 FINDINGS: There is no evidence of fracture or other focal bone lesions. Soft tissues are unremarkable. IMPRESSION: Negative. Electronically Signed   By: Donavan Foil M.D.   On: 01/26/2020 22:48   ECHOCARDIOGRAM COMPLETE  Result Date: 01/27/2020    ECHOCARDIOGRAM REPORT   Patient Name:   [facility]  Date of Exam: 01/27/2020 Medical Rec #:  [id number]  Height:       65.0 in Accession #:    [id number] Weight:       131.6 lb Date of Birth:  [birth year]   BSA:          1.656 m Patient Age:    3 years   BP:           108/97 mmHg Patient Gender: M          HR:           70 bpm. Exam Location:  Inpatient Procedure: 2D Echo, Cardiac Doppler and Color Doppler Indications:   R94.31 Abnormal EKG  History:       Patient has no prior history of Echocardiogram examinations.                Cancer.  Sonographer:   Jonelle Sidle Dance Referring      [place]: IMPRESSIONS  1. Left ventricular ejection fraction, by estimation, is 60 to 65%. The left ventricle has normal function. The left ventricle has no regional wall motion abnormalities. Left ventricular diastolic parameters were normal.  2. Right ventricular systolic function is normal. The right ventricular size is normal.  3. The mitral valve is normal in structure. No evidence of mitral valve regurgitation. No evidence of mitral stenosis.  4. The aortic valve is normal in structure. Aortic valve regurgitation is not visualized. No aortic stenosis is present.  5. The inferior vena cava is normal in size with greater than  50% respiratory variability, suggesting right atrial pressure of 3 mmHg. FINDINGS  Left Ventricle: Left ventricular ejection fraction, by estimation, is 60 to 65%. The left ventricle has normal function. The left ventricle has no regional wall motion abnormalities. The left  ventricular internal cavity size was normal in size. There is  no left ventricular hypertrophy. Left ventricular diastolic parameters were normal. Normal left ventricular filling pressure. Right Ventricle: The right ventricular size is normal. No increase in right ventricular wall thickness. Right ventricular systolic function is normal. Left Atrium: Left atrial size was normal in size. Right Atrium: Right atrial size was normal in size. Pericardium: There is no evidence of pericardial effusion. Mitral Valve: The mitral valve is normal in structure. No evidence of mitral valve regurgitation. No evidence of mitral valve stenosis. Tricuspid Valve: The tricuspid valve is normal in structure. Tricuspid valve regurgitation is not demonstrated. No evidence of tricuspid stenosis. Aortic Valve: The aortic valve is normal in structure. Aortic valve regurgitation is not visualized. No aortic stenosis is present. Pulmonic Valve: The pulmonic valve was normal in structure. Pulmonic valve regurgitation is not visualized. No evidence of pulmonic stenosis. Aorta: The aortic root is normal in size and structure. Venous: The inferior vena cava is normal in size with greater than 50% respiratory variability, suggesting right atrial pressure of 3 mmHg. IAS/Shunts: No atrial level shunt detected by color flow Doppler.  LEFT VENTRICLE PLAX 2D LVIDd:         2.75 cm LVIDs:         2.13 cm LV PW:         0.97 cm LV IVS:        0.94 cm LVOT diam:     2.20 cm LV SV:         78 LV SV Index:   47 LVOT Area:     3.80 cm  RIGHT VENTRICLE             IVC RV Basal diam:  2.20 cm     IVC diam: 1.47 cm RV S prime:     10.40 cm/s TAPSE (M-mode): 1.8 cm LEFT ATRIUM             Index       RIGHT ATRIUM           Index LA diam:        3.50 cm 2.11 cm/m  RA Area:     10.00 cm LA Vol (A2C):   26.5 ml 16.00 ml/m RA Volume:   17.90 ml  10.81 ml/m LA Vol (A4C):   48.9 ml 29.53 ml/m LA Biplane Vol: 35.9 ml 21.68 ml/m  AORTIC VALVE LVOT Vmax:   93.20  cm/s LVOT Vmean:  58.100 cm/s LVOT VTI:    0.206 m  AORTA Ao Root diam: 3.40 cm Ao Asc diam:  3.20 cm MITRAL VALVE MV Area (PHT): 3.27 cm     SHUNTS MV Decel Time: 232 msec     Systemic VTI:  0.21 m MV E velocity: 84.40 cm/s   Systemic Diam: 2.20 cm MV A velocity: 105.00 cm/s MV E/A ratio:  0.80 Mihai Croitoru MD Electronically signed by Sanda Klein MD Signature Date/Time: 01/27/2020/1:12:03 PM    Final    DG FEMUR 1V LEFT  Result Date: 01/26/2020 CLINICAL DATA:  Fall EXAM: LEFT FEMUR 1 VIEW COMPARISON:  05/07/2014 FINDINGS: There is no evidence of fracture or other focal bone lesions. Soft tissues are unremarkable. IMPRESSION: Negative. Electronically Signed   By: Maudie Mercury  Francoise Ceo M.D.   On: 01/26/2020 22:52   DG FEMUR 1V RIGHT  Result Date: 01/26/2020 CLINICAL DATA:  Fall EXAM: RIGHT FEMUR 1 VIEW COMPARISON:  05/07/2014 FINDINGS: There is no evidence of fracture or other focal bone lesions. Soft tissues are unremarkable. IMPRESSION: Negative. Electronically Signed   By: Donavan Foil M.D.   On: 01/26/2020 22:52   VAS [name] LOWER EXTREMITY VENOUS (DVT) (ONLY [facility] & [facility])  Result Date: 01/27/2020  Lower Venous DVT Study Indications: Swelling.  Limitations: Body habitus, poor ultrasound/tissue interface and contracted legs. Comparison Study: No prior study Performing Technologist: Maudry Mayhew MHA, RDMS, RVT, RDCS  Examination Guidelines: A complete evaluation includes B-mode imaging, spectral Doppler, color Doppler, and power Doppler as needed of all accessible portions of each vessel. Bilateral testing is considered an integral part of a complete examination. Limited examinations for reoccurring indications may be performed as noted. The reflux portion of the exam is performed with the patient in reverse Trendelenburg.  +---------+---------------+---------+-----------+----------+--------------+ RIGHT    CompressibilityPhasicitySpontaneityPropertiesThrombus Aging  +---------+---------------+---------+-----------+----------+--------------+ CFV                     Yes      Yes        patent                   +---------+---------------+---------+-----------+----------+--------------+ SFJ                              Yes        patent                   +---------+---------------+---------+-----------+----------+--------------+ FV Prox                          Yes        patent                   +---------+---------------+---------+-----------+----------+--------------+ FV Mid                           Yes        patent                   +---------+---------------+---------+-----------+----------+--------------+ FV Distal                        Yes                                 +---------+---------------+---------+-----------+----------+--------------+ POP      Full           Yes      Yes        patent                   +---------+---------------+---------+-----------+----------+--------------+   Right Technical Findings: Not visualized segments include PFV, PTV, peroneal veins.  +---------+---------------+---------+-----------+----------+--------------+ LEFT     CompressibilityPhasicitySpontaneityPropertiesThrombus Aging +---------+---------------+---------+-----------+----------+--------------+ FV Mid   Full                    Yes        patent                   +---------+---------------+---------+-----------+----------+--------------+ FV Distal  Yes        patent                   +---------+---------------+---------+-----------+----------+--------------+ POP      Full           Yes      Yes        patent                   +---------+---------------+---------+-----------+----------+--------------+ PTV      Full                               patent                   +---------+---------------+---------+-----------+----------+--------------+ PERO     Full                                patent                   +---------+---------------+---------+-----------+----------+--------------+   Left Technical Findings: Not visualized segments include CFV, SFJ, FV proximal, PFV,.   Summary: RIGHT: - There is no evidence of deep vein thrombosis in the lower extremity. However, portions of this examination were limited- see technologist comments above.  - No cystic structure found in the popliteal fossa.  LEFT: - There is no evidence of deep vein thrombosis in the lower extremity. However, portions of this examination were limited- see technologist comments above.  - No cystic structure found in the popliteal fossa.  *See table(s) above for measurements and observations. Electronically signed by Ruta Hinds MD on 01/27/2020 at 11:50:15 AM.    Final     Impression / Plan:   Impression: 1.  Constipation: Improving with laxatives, abdominal pain improving as well 2.  Abdominal pain 3.  Lower extremity edema 4.  History of dementia 5.  Anemia: some dried right blood in his right nares on exam at admission, FOBT positive, now with bright red blood in his stool after bowel movement; most likely hemorrhoids as patient was severely constipated and straining 6.  CKD stage III 7.  Tachycardia 8.  GERD: Patient tells me better with "medicine"  Plan: 1.  Patient will need an EGD and colonoscopy for further evaluation of anemia as well as reflux and constipation causing abdominal pain.  I do believe it will be hard for the patient to prep, will start bowel prep today with plans for procedure on Monday with Dr. Henrene Pastor.  Did discuss risks, benefits, limitations and alternatives and the patient agrees to proceed. 2.  Patient will be on a clear liquid diet for the rest of today and tomorrow and then n.p.o. after midnight. 3.  Continue to monitor hemoglobin with transfusion as needed less than 7 4.  Please alert our service if the patient has any acute signs of GI bleeding. 5.  Continue  pantoprazole for reflux. 6.  Please await any further recommendations from Dr. Tarri Glenn later today  Thank you for your kind consultation, we will continue to follow.  Lavone Nian Shylee Durrett  01/27/2020, 1:33 PM

## 2020-01-27 NOTE — Evaluation (Signed)
Physical Therapy Evaluation Patient Details Name: [name] MRN: [id number] DOB: 07/17/1945 Today's Date: 01/27/2020   History of Present Illness  74 Y/O M with Subdural Hematoma, Prostate Cancer, Colonic adenomatous polyps, presented with abdominal pain that started a few days ago. He denies N/V, his last BM was yesterday, and he endorsed seeing small blood in it. He also endorsed nasal bleed yesterday but none today.  Clinical Impression  Pt admitted due to abdominal pain; pt's daughter gave history and current level of function to OT via phone, daughter reports multiple falls; pt is very impulsive, unable to follow commands and demonstrates decreased safety awareness classifying pt at high fall risk; pt demonstrating deficits in DME use, balance, coordination, gait and safety and will benefit from skilled PT to address deficits; PT recommending 24 hour assist with H HPT or SNF    Follow Up Recommendations SNF;Supervision/Assistance - 24 hour    Equipment Recommendations  Other (comment) (TBD pending d/c)    Recommendations for Other Services       Precautions / Restrictions Precautions Precautions: Fall Restrictions Weight Bearing Restrictions: No      Mobility  Bed Mobility Overal bed mobility: Needs Assistance Bed Mobility: Supine to Sit Rolling: Supervision   Supine to sit: Mod assist          Transfers Overall transfer level: Needs assistance   Transfers: Sit to/from Stand;Stand Pivot Transfers Sit to Stand: Min assist Stand pivot transfers: Min assist;Mod assist       General transfer comment: attempted with cane and RW; pt min A with RW and mod A with cane  Ambulation/Gait Ambulation/Gait assistance: Min assist;Mod assist Gait Distance (Feet): 20 Feet Assistive device: Rolling walker (2 wheeled);Straight cane       General Gait Details: performed ambulation wtih RW requiring min A due to poor alignment and posture in RW pt with increased LOB requiring  min A to regain balance; pt impulsive and poor safety awareness with RW; pt requiring mod A wtih cane and not using appropriately, pt wtih multiple LOB requiring mod A to regain balance; pt very impulsive and unable to follow commands  Stairs            Wheelchair Mobility    Modified Rankin (Stroke Patients Only)       Balance Overall balance assessment: Needs assistance Sitting-balance support: No upper extremity supported Sitting balance-Leahy Scale: Good     Standing balance support: Bilateral upper extremity supported Standing balance-Leahy Scale: Poor                               Pertinent Vitals/Pain Pain Assessment: No/denies pain Faces Pain Scale: No hurt    Home Living Family/patient expects to be discharged to:: Private residence Living Arrangements: Alone Available Help at Discharge: Family;Available PRN/intermittently Type of Home: Apartment Home Access: Elevator     Home Layout: One level Home Equipment: Walker - 2 wheels;Cane - single point Additional Comments: daughter thinks he needs a wheelchair    Prior Function Level of Independence: Needs assistance   Gait / Transfers Assistance Needed: 2WRW with recent falls per daughter  ADL's / Homemaking Assistance Needed: Daughter states he bathes and dresses himself.  Family assists with meds, meals and community mobility.  Son in law stops by 2x/wk        Hand Dominance   Dominant Hand: Right    Extremity/Trunk Assessment   Upper Extremity Assessment Upper Extremity Assessment: Defer  to OT evaluation    Lower Extremity Assessment Lower Extremity Assessment: Overall WFL for tasks assessed       Communication   Communication: Prefers language other than English  Cognition Arousal/Alertness: Awake/alert Behavior During Therapy: Flat affect Overall Cognitive Status: No family/caregiver present to determine baseline cognitive functioning                                  General Comments: pt very impulsive and unaware of deficits or safety concerns      General Comments General comments (skin integrity, edema, etc.): VSS; pt with extremely poor safety awareness, very impulsive and unable to follow commands    Exercises     Assessment/Plan    PT Assessment Patient needs continued PT services  PT Problem List Decreased mobility;Decreased safety awareness;Decreased coordination;Decreased activity tolerance;Decreased balance;Decreased knowledge of use of DME       PT Treatment Interventions DME instruction;Therapeutic exercise;Gait training;Balance training;Therapeutic activities;Patient/family education    PT Goals (Current goals can be found in the Care Plan section)  Acute Rehab PT Goals Patient Stated Goal: Daughter wants him to have a wheelchair PT Goal Formulation: With family Time For Goal Achievement: 02/10/20 Potential to Achieve Goals: Fair    Frequency Min 2X/week   Barriers to discharge        Co-evaluation               AM-PAC PT "6 Clicks" Mobility  Outcome Measure Help needed turning from your back to your side while in a flat bed without using bedrails?: None Help needed moving from lying on your back to sitting on the side of a flat bed without using bedrails?: A Lot Help needed moving to and from a bed to a chair (including a wheelchair)?: A Little Help needed standing up from a chair using your arms (e.g., wheelchair or bedside chair)?: A Little Help needed to walk in hospital room?: A Lot Help needed climbing 3-5 steps with a railing? : A Lot 6 Click Score: 16    End of Session Equipment Utilized During Treatment: Gait belt Activity Tolerance: Patient tolerated treatment well Patient left: in chair;with chair alarm set;with call bell/phone within reach;Other (comment) (SLP present for evaluation) Nurse Communication: Mobility status PT Visit Diagnosis: Unsteadiness on feet (R26.81);Other abnormalities of  gait and mobility (R26.89);History of falling (Z91.81);Repeated falls (R29.6)    Time: 0630-1601 PT Time Calculation (min) (ACUTE ONLY): 18 min   Charges:   PT Evaluation $PT Eval Low Complexity: 1 Low          Lyanne Co, DPT Acute Rehabilitation Services [phone]  [place] 01/27/2020, 11:49 AM

## 2020-01-27 NOTE — Progress Notes (Addendum)
FPTS Interim Progress Note  S: Went to see patient.  Indicates he is currently feeling fine.  Is communicating appropriately although occasionally slurring words.  Denies any lihghtheadedness or change in energy level. Does engdorse mild abdominal pain.  Spoke with nurse who indicated there was frank blood outside of stool.  Indicated no  Patient had been bearing down and had trouble passing stool.  O: BP (!) 108/97 (BP Location: Left Arm)   Pulse (!) 104   Temp 97.7 F (36.5 C) (Oral)   Resp 20   Wt 59.7 kg   SpO2 100%   BMI 21.90 kg/m    Physical Exam Constitutional:      General: He is not in acute distress.    Appearance: Normal appearance.  HENT:     Head: Normocephalic and atraumatic.     Mouth/Throat:     Mouth: Mucous membranes are moist.  Cardiovascular:     Rate and Rhythm: Regular rhythm. Tachycardia present.     Pulses: Normal pulses.  Pulmonary:     Effort: Pulmonary effort is normal.     Breath sounds: Normal breath sounds.  Abdominal:     General: Abdomen is flat. Bowel sounds are normal. There is no distension.     Palpations: Abdomen is soft.     Tenderness: There is abdominal tenderness.  Skin:    General: Skin is warm.  Neurological:     Mental Status: He is alert.     A/P: Patient slightly tachycardic.  Repeated BP that was 120/81.  Patient currently hemodynamically stable.  Likely due to constipation, encouraged nurse to reach out if repeat changes or changes clinically.  Little concern for significant bleed. - Order Miralax and Senna - Will contact GI  Delora Fuel, MD 01/27/2020, 12:52 PM PGY-1, [facility] pager [phone]

## 2020-01-27 NOTE — Progress Notes (Signed)
Family Medicine Teaching Service Daily Progress Note Intern Pager: [phone]  Patient name: [name] Medical record number: [id number] Date of birth: [birth year] Age: 74 y.o. Gender: male  Primary Care Provider: Matilde Haymaker, MD Consultants: GI Code Status: Full  Pt Overview and Major Events to Date:  Admitted 11/12 for abdominal pain  Assessment and Plan: [name] is a 75 y.o. male presenting with lower extremity edema and abdominal pain. PMH is significant for dementia, frequent falls, chronic subdural hematoma, CKD stage 3, and oropharyngeal dysphagia.  Of note, history is extremely limited as patient is vietnamese speaking only and interpreters are unable to understand patient, as he is edentulous.   Abdominal pain with constipation CT abdomen yesterday showed cyst on left kidney so patient should have nonemergent MRI but otherwise normal.  This morning patient did not complain of abdominal pain and reported he was very hungry but was tender to palpation.  He had positive bowel sounds.  When I entered the room the patient was trying to open ketchup and mustard packets to eat because he says he is hungry.  Of note patient had a recent colonoscopy in 2017 which showed an adenomatous polyp and repeat colonoscopy was recommended 3 years.  He was FOBT positive during this admission. -Consult GI to determine if they would like to scope him inpatient or if outpatient follow-up is warranted -OOB with assistance -Vitals per routine -Liquid diet -IV Protonix 40 mg twice daily -Speech eval for swallow study given concerns regarding aspiration risk -MiraLAX and senna for constipation -Advance diet as tolerated  Lower extremity edema There was concern for possible heart failure and he was given 40 mg Lasix in the ED.  He put out almost 2 L of urine in the last 24 hours.  His creatinine did increase to 2.08 from 1.73.  No further diuresis as ordered.  Physical exam showed trace lower extremity  pitting edema. -Echocardiogram pending -Continue to monitor lower extremity edema -Consider additional diuresis pending echo results  History of recurrent falls with recent fall Skeletal survey shows no acute fractures -DG chest, pelvis, left upper and lower extremities within normal limits -PT/OT eval and treat -Out of bed with assistance -B12 and folate  Home situation with history of dementia and language barrier Patient requires 24-hour care but does not have a clear that that is occurring at home.  Daughter is contact and her numbers [phone] -PT/OT eval and treat -Anticipate discharge to SNF -Transitions of care consult after PT/OT Recs  Anemia, macrocytic Hemoglobin 9.5 on admission with MCV 102.2. Patient has dried blood in his R nares on exam, so could be secondary to epistaxis. Also reports blood in stool yesterday and is FOBT+, so GI bleed remains high on the differential.  -am CBC -Continue to monitor for bloody stools or other source of bleeding -B12/folate levels given macrocytosis -Will start PPI therapy as above for possible GI bleed  Tachycardia Patient has been intermittently tachycardic on the monitor. Could be related to acute pain or anemia, but will consider cardiac causes as well.  Troponins stable at 7>7. -Continue to monitor   CKD III  GFR 40's at baseline. Creatinine 1.5 at baseline. Received IV 40 lasix in ED with almost 2 L diruesis.  Creatinine increased to 2.08 this morning.  No further diuresis at this time. - AM BMP  - Avoid other neprhotoxic meds   Hx of prostate cancer S/p radiation  FEN/GI: N.p.o. until speech eval and will advance after PPx: Lovenox  Subjective:  Patient lying in bed this morning trying to open mustard packet.  I used a [place] who reported she could understand approximately half of what the patient was saying due to his slurred speech as well as possibly a different dialect of [place] or poor  pronunciation.  Patient denied any pain and had no complaints this morning other than that he was hungry.  Denied abdominal pain although tender to palpation  Objective: Temp:  [97.7 F (36.5 C)-98.7 F (37.1 C)] 97.7 F (36.5 C) (11/13 0400) Pulse Rate:  [72-115] 104 (11/13 0400) Resp:  [13-22] 20 (11/13 0400) BP: (92-123)/(63-98) 108/97 (11/13 0400) SpO2:  [98 %-100 %] 100 % (11/13 0400) Weight:  [59.7 kg] 59.7 kg (11/13 0400) Physical Exam: General: Sitting in bed, looking comfortable Cardiovascular: Mild tachycardia in the 100s while in the room Respiratory: Normal work of breathing, lungs clear to auscultation Abdomen: Mild distention noted, tenderness to palpation, positive bowel sounds Extremities: Trace pitting edema in lower extremities bilaterally  Laboratory: Recent Labs  Lab 01/26/20 1017 01/27/20 0443  WBC 5.3 5.0  HGB 9.5* 9.7*  HCT 28.0* 28.5*  PLT 301 294   Recent Labs  Lab 01/26/20 1017 01/27/20 0443  NA 137 137  K 4.7 4.7  CL 108 106  CO2 21* 23  BUN 35* 35*  CREATININE 1.73* 2.08*  CALCIUM 8.8* 9.0  PROT 6.5 6.6  BILITOT 0.8 1.3*  ALKPHOS 78 83  ALT 39 38  AST 50* 47*  GLUCOSE 92 85   FOBT positive  Vitamin B12 437 Folate 14.6 Acid 1.0  Imaging/Diagnostic Tests: DG Chest 1 View  Result Date: 01/26/2020 CLINICAL DATA:  Fall EXAM: CHEST  1 VIEW COMPARISON:  05/25/2018, 04/30/2018 FINDINGS: No focal opacity or pleural effusion. Stable cardiomediastinal silhouette with right hilar fullness presumably due to vascular summation shadow. Aortic atherosclerosis. No pneumothorax. Chronic right fifth rib fracture IMPRESSION: No active disease. Electronically Signed   By: Donavan Foil M.D.   On: 01/26/2020 22:55   DG Pelvis 1-2 Views  Result Date: 01/26/2020 CLINICAL DATA:  Fall EXAM: PELVIS - 1-2 VIEW COMPARISON:  11/01/2017 FINDINGS: Faint contrast within the urinary bladder. Clips in the region of the prostate. Pubic symphysis and rami appear  intact. Both femoral heads project in joint. No fracture or malalignment IMPRESSION: No acute osseous abnormality Electronically Signed   By: Donavan Foil M.D.   On: 01/26/2020 22:46   DG Forearm Left  Result Date: 01/26/2020 CLINICAL DATA:  Fall EXAM: LEFT FOREARM - 2 VIEW COMPARISON:  None. FINDINGS: No acute displaced fracture or malalignment. No significant elbow effusion. Chronic appearing deformity of the first metacarpal IMPRESSION: No acute osseous abnormality. Electronically Signed   By: Donavan Foil M.D.   On: 01/26/2020 22:51   DG Tibia/Fibula Left  Result Date: 01/26/2020 CLINICAL DATA:  Encounter for fall EXAM: LEFT TIBIA AND FIBULA - 2 VIEW COMPARISON:  05/07/2014 FINDINGS: No fracture or malalignment.  Diffuse soft tissue edema. IMPRESSION: No acute osseous abnormality. Electronically Signed   By: Donavan Foil M.D.   On: 01/26/2020 22:47   DG Tibia/Fibula Right  Result Date: 01/26/2020 CLINICAL DATA:  Fall, right foreleg pain EXAM: RIGHT TIBIA AND FIBULA - 2 VIEW COMPARISON:  None. FINDINGS: Two view radiograph right foreleg demonstrates normal alignment. No fracture or dislocation. No destructive osseous lesion. There is diffuse subcutaneous edema involving the right foreleg. IMPRESSION: Diffuse subcutaneous edema.  No fracture or dislocation. Electronically Signed   By: Fidela Salisbury MD  On: 01/26/2020 22:46   CT Head Wo Contrast  Result Date: 01/26/2020 CLINICAL DATA:  74 year old male with altered mental status. EXAM: CT HEAD WITHOUT CONTRAST TECHNIQUE: Contiguous axial images were obtained from the base of the skull through the vertex without intravenous contrast. COMPARISON:  Head CT dated 03/22/2019. FINDINGS: Brain: There is mild age-related atrophy and chronic microvascular ischemic changes. Small chronic left frontal subdural collection/hematoma measuring 3 mm in thickness was seen on the prior CT. No new hemorrhage. No mass effect or midline shift. Vascular: No  hyperdense vessel or unexpected calcification. Skull: Normal. Negative for fracture or focal lesion. Sinuses/Orbits: No acute finding. Other: None IMPRESSION: 1. Stable small chronic left frontal subdural hemorrhage. No new bleed. 2. Mild age-related atrophy and chronic microvascular ischemic changes. Electronically Signed   By: Anner Crete M.D.   On: 01/26/2020 21:29   CT ABDOMEN PELVIS W CONTRAST  Result Date: 01/26/2020 CLINICAL DATA:  Lower extremity edema and abdominal pain EXAM: CT ABDOMEN AND PELVIS WITH CONTRAST TECHNIQUE: Multidetector CT imaging of the abdomen and pelvis was performed using the standard protocol following bolus administration of intravenous contrast. CONTRAST:  180mL OMNIPAQUE IOHEXOL 300 MG/ML  SOLN COMPARISON:  CT abdomen and pelvis January 16, 2020 FINDINGS: Lower chest: There is bibasilar atelectasis. No lung base edema or consolidation. There is a sizable hiatal hernia, stable. Hepatobiliary: There is a 7 mm cyst immediately to the left of the left portal vein within the liver. No other focal liver lesions are evident. Gallbladder appears upper normal in size without appreciable gallbladder wall thickening. There is no biliary duct dilatation. Pancreas: There is no pancreatic mass or inflammatory focus. Spleen: No splenic lesions are evident. Adrenals/Urinary Tract: Adrenals bilaterally appear normal. Renal cysts are noted bilaterally, stable. The previously noted focus of enhancement immediately adjacent to a cyst along the lateral left kidney measuring 8 mm is best seen on coronal slice 42 series 6 and is unchanged from recent prior study. No hydronephrosis is appreciable on either side. There is a 4 x 2 mm calculus in the lower pole of the left kidney. There is a 2 mm calculus in the upper pole of the left kidney. There is no evident ureteral calculus on either side. Urinary bladder is midline with wall thickness within normal limits. Stomach/Bowel: There is moderate  stool in the colon. There is no appreciable bowel wall or mesenteric thickening. Scattered colonic diverticula, most notably in the cecal region without diverticular inflammation. No appreciable bowel obstruction. Terminal ileum appears normal. There is no evident free air or portal venous air. Vascular/Lymphatic: No abdominal aortic aneurysm. There is aortic and iliac artery atherosclerosis. Major venous structures appear patent. No evident adenopathy in the abdomen or pelvis. Reproductive: There are surgical clips posterior to the prostate, stable. Prostate and seminal vesicles appear normal in size and contour. Other: Appendix appears normal no evident abscess or ascites in the abdomen or pelvis. Musculoskeletal: There is stable relative thickening of the left lateral internal oblique muscle compared to its counterpart on the right. The attenuation in this muscle appears normal without obvious hematoma or mass. No other muscle asymmetry is evident. There is degenerative change in the lumbar spine. No blastic or lytic bone lesions are evident. Note that there is lower lumbar levoscoliosis. IMPRESSION: 1. 8 mm enhancing area lateral left kidney persists immediately adjacent to a cyst. Nonemergent MR to further evaluate this area remains advisable. A small neoplasm in this area cannot be excluded on this study. 2. Asymmetric thickening  of the internal oblique muscle on the left laterally compared to the right. Significance of this finding uncertain. No hematoma or mass seen within this area of muscle asymmetry. 3. No bowel wall thickening or bowel obstruction. Scattered colonic diverticula without diverticulitis. No free air. Appendix appears normal. No abscess in the abdomen or pelvis. 4.  Sizable hiatal hernia remains. 5.  Aortic Atherosclerosis (ICD10-I70.0). 6. No renal or ureteral calculus. No hydronephrosis. Urinary bladder wall thickness normal. Electronically Signed   By: Lowella Grip III M.D.   On:  01/26/2020 14:48   DG Hand 2 View Left  Result Date: 01/26/2020 CLINICAL DATA:  Fall EXAM: LEFT HAND - 2 VIEW COMPARISON:  None. FINDINGS: No acute displaced fracture or malalignment. Joint space narrowing and degenerative change at the fourth and fifth MCP joints. Chronic appearing deformity of the first metacarpal presumably due to prior trauma. IMPRESSION: No acute osseous abnormality. Chronic appearing deformity of the first metacarpal Electronically Signed   By: Donavan Foil M.D.   On: 01/26/2020 22:50   DG Humerus Left  Result Date: 01/26/2020 CLINICAL DATA:  Fall EXAM: LEFT HUMERUS - 2+ VIEW COMPARISON:  03/22/2019 FINDINGS: There is no evidence of fracture or other focal bone lesions. Soft tissues are unremarkable. IMPRESSION: Negative. Electronically Signed   By: Donavan Foil M.D.   On: 01/26/2020 22:48   DG FEMUR 1V LEFT  Result Date: 01/26/2020 CLINICAL DATA:  Fall EXAM: LEFT FEMUR 1 VIEW COMPARISON:  05/07/2014 FINDINGS: There is no evidence of fracture or other focal bone lesions. Soft tissues are unremarkable. IMPRESSION: Negative. Electronically Signed   By: Donavan Foil M.D.   On: 01/26/2020 22:52   DG FEMUR 1V RIGHT  Result Date: 01/26/2020 CLINICAL DATA:  Fall EXAM: RIGHT FEMUR 1 VIEW COMPARISON:  05/07/2014 FINDINGS: There is no evidence of fracture or other focal bone lesions. Soft tissues are unremarkable. IMPRESSION: Negative. Electronically Signed   By: Donavan Foil M.D.   On: 01/26/2020 22:52   VAS [name] LOWER EXTREMITY VENOUS (DVT) (ONLY [facility] & [facility])  Result Date: 01/26/2020  Lower Venous DVT Study Indications: Swelling.  Limitations: Body habitus, poor ultrasound/tissue interface and contracted legs. Comparison Study: No prior study Performing Technologist: Maudry Mayhew MHA, RDMS, RVT, RDCS  Examination Guidelines: A complete evaluation includes B-mode imaging, spectral Doppler, color Doppler, and power Doppler as needed of all accessible portions of each vessel.  Bilateral testing is considered an integral part of a complete examination. Limited examinations for reoccurring indications may be performed as noted. The reflux portion of the exam is performed with the patient in reverse Trendelenburg.  +---------+---------------+---------+-----------+----------+--------------+ RIGHT    CompressibilityPhasicitySpontaneityPropertiesThrombus Aging +---------+---------------+---------+-----------+----------+--------------+ CFV                     Yes      Yes        patent                   +---------+---------------+---------+-----------+----------+--------------+ SFJ                              Yes        patent                   +---------+---------------+---------+-----------+----------+--------------+ FV Prox                          Yes  patent                   +---------+---------------+---------+-----------+----------+--------------+ FV Mid                           Yes        patent                   +---------+---------------+---------+-----------+----------+--------------+ FV Distal                        Yes                                 +---------+---------------+---------+-----------+----------+--------------+ POP      Full           Yes      Yes        patent                   +---------+---------------+---------+-----------+----------+--------------+   Right Technical Findings: Not visualized segments include PFV, PTV, peroneal veins.  +---------+---------------+---------+-----------+----------+--------------+ LEFT     CompressibilityPhasicitySpontaneityPropertiesThrombus Aging +---------+---------------+---------+-----------+----------+--------------+ FV Mid   Full                    Yes        patent                   +---------+---------------+---------+-----------+----------+--------------+ FV Distal                        Yes        patent                    +---------+---------------+---------+-----------+----------+--------------+ POP      Full           Yes      Yes        patent                   +---------+---------------+---------+-----------+----------+--------------+ PTV      Full                               patent                   +---------+---------------+---------+-----------+----------+--------------+ PERO     Full                               patent                   +---------+---------------+---------+-----------+----------+--------------+   Left Technical Findings: Not visualized segments include CFV, SFJ, FV proximal, PFV,.   Summary: RIGHT: - There is no evidence of deep vein thrombosis in the lower extremity. However, portions of this examination were limited- see technologist comments above.  - No cystic structure found in the popliteal fossa.  LEFT: - There is no evidence of deep vein thrombosis in the lower extremity. However, portions of this examination were limited- see technologist comments above.  - No cystic structure found in the popliteal fossa.  *See table(s) above for measurements and observations.    Preliminary      Gifford Shave, MD 01/27/2020, 8:53 AM PGY-2, [facility] Intern pager: [phone], text  pages welcome

## 2020-01-27 NOTE — Evaluation (Signed)
Clinical/Bedside Swallow Evaluation Patient Details  Name: [name] MRN: [id number] Date of Birth: 1946-01-16  Today's Date: 01/27/2020 Time: SLP Start Time (ACUTE ONLY): 1110 SLP Stop Time (ACUTE ONLY): 1135 SLP Time Calculation (min) (ACUTE ONLY): 25 min  Past Medical History:  Past Medical History:  Diagnosis Date  . Adenomatous polyp of colon 06/2010   4 polyps removed at colonoscopy by Dr Henrene Pastor  . Anxiety   . Chronic headaches   . Depression   . Diverticulosis of colon 08/2010  . Internal hemorrhoids 08/2010  . Prostate cancer [facility])    receiving radiation treatment  . Subdural hematoma, chronic ([place])   . Tuberculosis    Past Surgical History:  Past Surgical History:  Procedure Laterality Date  . FLEXIBLE SIGMOIDOSCOPY  03/29/2012   Procedure: FLEXIBLE SIGMOIDOSCOPY;  Surgeon: Inda Castle, MD;  Location: [place];  Service: Endoscopy;  Laterality: N/A;  possibly may do colonoscopy but prep is only for a flex  . FLEXIBLE SIGMOIDOSCOPY  03/31/2012   Procedure: FLEXIBLE SIGMOIDOSCOPY;  Surgeon: Inda Castle, MD;  Location: [place];  Service: Endoscopy;  Laterality: N/A;  . HEMORRHOID BANDING  03/31/2012   Procedure: HEMORRHOID BANDING;  Surgeon: Inda Castle, MD;  Location: [place];  Service: Endoscopy;  Laterality: N/A;  . MULTIPLE EXTRACTIONS WITH ALVEOLOPLASTY N/A 08/08/2013   Procedure: MULTIPLE EXTRACTION OF TEETH #1, 2, 3, 6, 7, 9, 11, 15, 16, 17, 20, 21, 22, 23, 24, 26, 29, 30, 32 WITH ALVEOLOPLASTY AND REMOVAL BUCCAL EXOSTOSIS LEFT MAXILLA;  Surgeon: Gae Bon, DDS;  Location: [place];  Service: Oral Surgery;  Laterality: N/A;  . shrapnel removal     skull during [place]   HPI:  74 Y/O M with Subdural Hematoma, Prostate Cancer, Colonic adenomatous polyps, presented with abdominal pain that started a few days ago.    Assessment / Plan / Recommendation Clinical Impression  Pt was seen for a bedside swallow evaluation and he presents with mild  oral dysphagia secondary to dentition, with concerns for pharyngeal and/or esophageal dysphagia.  [employer] interpreter ([name] [phone]) was used for this session.  Pt had some difficulty following commands to complete oral mechanism examination, but he was noted to have two teeth on the bottom, otherwise he was edentulous.  RN reported that the pt has top dentures. Pt consumed thin liquid, puree, and regular solids.  He was noted to be significantly impulsive, evidenced by taking multiple large bites in a row despite verbal and visual cues to slow his rate of intake and to reduce the bolus size.  Intermittent, delayed throat clearing was noted with liquid, puree, and solid trials in addition to eructation.  Suspect pharyngeal vs esophageal component.  Mastication of regular solids was mildly prolonged secondary to dentition, but it was functional.  Recommend initiation of Dysphagia 3 (soft) solids and thin liquids with small medications administered whole in puree and large medications cut/crushed in puree.  Additionally recommend intermittent supervision during meals to cue for compensatory strategies.  SLP will f/u to monitor diet tolerance.    SLP Visit Diagnosis: Dysphagia, unspecified (R13.10)    Aspiration Risk  Mild aspiration risk    Diet Recommendation Dysphagia 3 (Mech soft);Thin liquid   Liquid Administration via: Cup;Straw Medication Administration: Whole meds with puree Supervision: Patient able to self feed;Intermittent supervision to cue for compensatory strategies Compensations: Minimize environmental distractions;Slow rate;Small sips/bites Postural Changes: Seated upright at 90 degrees    Other  Recommendations Oral Care Recommendations: Oral care BID  Follow up Recommendations Other (comment) (TBD)      Frequency and Duration min 2x/week  2 weeks       Prognosis Prognosis for Safe Diet Advancement: Good      Swallow Study   General HPI: 74 Y/O M with Subdural Hematoma,  Prostate Cancer, Colonic adenomatous polyps, presented with abdominal pain that started a few days ago.  Type of Study: Bedside Swallow Evaluation Previous Swallow Assessment: None Diet Prior to this Study: NPO Temperature Spikes Noted: No Respiratory Status: Room air History of Recent Intubation: No Behavior/Cognition: Alert;Cooperative;Pleasant mood;Impulsive Oral Cavity Assessment: Within Functional Limits Oral Care Completed by SLP: No Oral Cavity - Dentition: Dentures, top;Missing dentition (two teeth on bottom ) Vision: Functional for self-feeding Self-Feeding Abilities: Able to feed self;Needs set up Patient Positioning: Upright in chair Volitional Swallow: Unable to elicit    Oral/Motor/Sensory Function Overall Oral Motor/Sensory Function: Other (comment) (Pt had difficulty following commands to complete)   Ice Chips Ice chips: Not tested   Thin Liquid Thin Liquid: Impaired Presentation: Cup;Straw Pharyngeal  Phase Impairments: Throat Clearing - Delayed    Nectar Thick Nectar Thick Liquid: Not tested   Honey Thick Honey Thick Liquid: Not tested   Puree Puree: Impaired Presentation: Self Fed;Spoon Pharyngeal Phase Impairments: Throat Clearing - Delayed   Solid     Solid: Impaired Presentation: Self Fed Pharyngeal Phase Impairments: Throat Clearing - Delayed     Colin Mulders M.S., CCC-SLP Acute Rehabilitation Services Office: [phone]  Elvia Collum Lucely Leard 01/27/2020,11:51 AM

## 2020-01-27 NOTE — Evaluation (Signed)
Occupational Therapy Evaluation Patient Details Name: [name] MRN: [id number] DOB: 07-05-1945 Today's Date: 01/27/2020    History of Present Illness 74 Y/O M with Subdural Hematoma, Prostate Cancer, Colonic adenomatous polyps, presented with abdominal pain that started a few days ago. He denies N/V, his last BM was yesterday, and he endorsed seeing small blood in it. He also endorsed nasal bleed yesterday but none today.   Clinical Impression   Patient admitted with the above diagnosis.  Spoke with daughter by phone.  See eval for prior level of function.  Currently he needs Min A for lower body ADL, up to Mod A for bed mobility and hand held assist for transfers.  His cane is in the room.  Daughter states he falls a lot at home, and could use a wheelchair.  She states she used to go to his apartment 4x/wk, but recently had a baby and now her husband goes 2x/wk to assist with meals and any home management needed.  He does not drive.  OT will follow in acute and recommend [facility] for now.      Follow Up Recommendations  Home health OT    Equipment Recommendations  Tub/shower seat and ? Wheelchair.   Recommendations for Other Services       Precautions / Restrictions Precautions Precautions: Fall Restrictions Weight Bearing Restrictions: No      Mobility Bed Mobility Overal bed mobility: Needs Assistance Bed Mobility: Rolling;Supine to Sit Rolling: Supervision   Supine to sit: Mod assist          Transfers Overall transfer level: Needs assistance   Transfers: Sit to/from Stand;Stand Pivot Transfers Sit to Stand: Min assist Stand pivot transfers: Min assist       General transfer comment: HHA    Balance Overall balance assessment: Needs assistance Sitting-balance support: No upper extremity supported Sitting balance-Leahy Scale: Good     Standing balance support: Bilateral upper extremity supported Standing balance-Leahy Scale: Poor                              ADL either performed or assessed with clinical judgement   ADL Overall ADL's : Needs assistance/impaired Eating/Feeding: NPO   Grooming: Wash/dry hands;Wash/dry face;Oral care;Min guard;Sitting   Upper Body Bathing: Min guard;Sitting   Lower Body Bathing: Minimal assistance;Sit to/from stand Lower Body Bathing Details (indicate cue type and reason): decreased balance Upper Body Dressing : Minimal assistance;Sitting   Lower Body Dressing: Minimal assistance;Sitting/lateral leans                       Vision Patient Visual Report: No change from baseline       Perception     Praxis      Pertinent Vitals/Pain Pain Assessment: No/denies pain     Hand Dominance Right   Extremity/Trunk Assessment Upper Extremity Assessment Upper Extremity Assessment: Generalized weakness   Lower Extremity Assessment Lower Extremity Assessment: Defer to PT evaluation       Communication Communication Communication: Prefers language other than English   Cognition Arousal/Alertness: Awake/alert Behavior During Therapy: Flat affect Overall Cognitive Status: Within Functional Limits for tasks assessed                                     General Comments   HR up to 110 with movement    Exercises  Shoulder Instructions      Home Living Family/patient expects to be discharged to:: Private residence Living Arrangements: Alone Available Help at Discharge: Family;Available PRN/intermittently Type of Home: Apartment Home Access: Elevator     Home Layout: One level     Bathroom Shower/Tub: Teacher, early years/pre: Standard     Home Equipment: Environmental consultant - 2 wheels   Additional Comments: daughter thinks he needs a wheelchair      Prior Functioning/Environment Level of Independence: Needs assistance  Gait / Transfers Assistance Needed: 2WRW with recent falls per daughter ADL's / Homemaking Assistance Needed: Daughter states he  bathes and dresses himself.  Family assists with meds, meals and community mobility.  Son in law stops by 2x/wk            OT Problem List: Decreased strength;Decreased activity tolerance;Impaired balance (sitting and/or standing)      OT Treatment/Interventions: Self-care/ADL training;Therapeutic exercise;Balance training;Therapeutic activities    OT Goals(Current goals can be found in the care plan section) Acute Rehab OT Goals Patient Stated Goal: Daughter wants him to have a wheelchair OT Goal Formulation: With patient/family Time For Goal Achievement: 02/10/20 Potential to Achieve Goals: Fair ADL Goals Pt Will Perform Grooming: with set-up;standing Pt Will Perform Lower Body Bathing: with set-up;sit to/from stand Pt Will Perform Lower Body Dressing: with set-up Pt Will Transfer to Toilet: with modified independence;ambulating;regular height toilet  OT Frequency: Min 2X/week   Barriers to D/C: Decreased caregiver support          Co-evaluation              AM-PAC OT "6 Clicks" Daily Activity     Outcome Measure Help from another person eating meals?: None Help from another person taking care of personal grooming?: None Help from another person toileting, which includes using toliet, bedpan, or urinal?: A Little Help from another person bathing (including washing, rinsing, drying)?: A Little Help from another person to put on and taking off regular upper body clothing?: A Little Help from another person to put on and taking off regular lower body clothing?: A Little 6 Click Score: 20   End of Session Nurse Communication: Mobility status  Activity Tolerance: Patient tolerated treatment well Patient left: in chair;with call bell/phone within reach;with chair alarm set  OT Visit Diagnosis: Unsteadiness on feet (R26.81);Muscle weakness (generalized) (M62.81);History of falling (Z91.81)                Time: 1308-6578 OT Time Calculation (min): 19 min Charges:  OT  General Charges $OT Visit: 1 Visit OT Evaluation $OT Eval Moderate Complexity: 1 Mod  01/27/2020  Rich, OTR/L  Acute Rehabilitation Services  Office:  [place] 01/27/2020, 10:25 AM

## 2020-01-27 NOTE — Progress Notes (Signed)
  Echocardiogram 2D Echocardiogram has been performed.  [name] G Romonia Yanik 01/27/2020, 11:00 AM

## 2020-01-28 ENCOUNTER — Inpatient Hospital Stay (HOSPITAL_COMMUNITY): Payer: Medicare Other

## 2020-01-28 DIAGNOSIS — R109 Unspecified abdominal pain: Secondary | ICD-10-CM | POA: Diagnosis not present

## 2020-01-28 DIAGNOSIS — K625 Hemorrhage of anus and rectum: Secondary | ICD-10-CM | POA: Diagnosis not present

## 2020-01-28 DIAGNOSIS — M7989 Other specified soft tissue disorders: Secondary | ICD-10-CM | POA: Diagnosis not present

## 2020-01-28 DIAGNOSIS — K5900 Constipation, unspecified: Secondary | ICD-10-CM | POA: Diagnosis not present

## 2020-01-28 DIAGNOSIS — K5521 Angiodysplasia of colon with hemorrhage: Secondary | ICD-10-CM | POA: Diagnosis not present

## 2020-01-28 DIAGNOSIS — K921 Melena: Secondary | ICD-10-CM | POA: Diagnosis not present

## 2020-01-28 LAB — BASIC METABOLIC PANEL
Anion gap: 9 (ref 5–15)
BUN: 30 mg/dL — ABNORMAL HIGH (ref 8–23)
CO2: 24 mmol/L (ref 22–32)
Calcium: 8.7 mg/dL — ABNORMAL LOW (ref 8.9–10.3)
Chloride: 106 mmol/L (ref 98–111)
Creatinine, Ser: 2.04 mg/dL — ABNORMAL HIGH (ref 0.61–1.24)
GFR, Estimated: 34 mL/min — ABNORMAL LOW (ref >60)
Glucose, Bld: 88 mg/dL (ref 70–99)
Potassium: 5 mmol/L (ref 3.5–5.1)
Sodium: 139 mmol/L (ref 135–145)

## 2020-01-28 LAB — CBC
HCT: 26 % — ABNORMAL LOW (ref 39.0–52.0)
Hemoglobin: 8.9 g/dL — ABNORMAL LOW (ref 13.0–17.0)
MCH: 34.2 pg — ABNORMAL HIGH (ref 26.0–34.0)
MCHC: 34.2 g/dL (ref 30.0–36.0)
MCV: 100 fL (ref 80.0–100.0)
Platelets: 282 10*3/uL (ref 150–400)
RBC: 2.6 MIL/uL — ABNORMAL LOW (ref 4.22–5.81)
RDW: 12.2 % (ref 11.5–15.5)
WBC: 4.6 10*3/uL (ref 4.0–10.5)
nRBC: 0 % (ref 0.0–0.2)

## 2020-01-28 LAB — IRON AND TIBC
Iron: 41 ug/dL — ABNORMAL LOW (ref 45–182)
Saturation Ratios: 16 % — ABNORMAL LOW (ref 17.9–39.5)
TIBC: 262 ug/dL (ref 250–450)
UIBC: 221 ug/dL

## 2020-01-28 LAB — FERRITIN: Ferritin: 118 ng/mL (ref 24–336)

## 2020-01-28 MED ORDER — PEG-KCL-NACL-NASULF-NA ASC-C 100 G PO SOLR
1.0000 | Freq: Once | ORAL | Status: AC
  Administered 2020-01-28: 200 g via ORAL
  Filled 2020-01-28: qty 1

## 2020-01-28 NOTE — Progress Notes (Signed)
[facility]  Called and spoke with patient's daughter who tells me that she is okay for him to have an EGD and colonoscopy tomorrow.  Discuss the procedures with her.  She tells me though that she is unable to come into the hospital and that her father can sign his consent form, she is not his POA.  If nursing staff needs her approval will need to call her and get phone consent.  Ellouise Newer, PA-C

## 2020-01-28 NOTE — Progress Notes (Signed)
Documentation by nursing student verified by this supervising RN.

## 2020-01-28 NOTE — H&P (View-Only) (Signed)
     [facility] Progress Note  CC:  "I'm feeling better"  Assessment / Plan: Abdominal pain with recent complaints of nausea, vomiting, anorexia, and constipation with bright red blood per rectum after passing a hard stool. Has chronic anemia with guaiac + stools in the setting of recent epistaxis. CT scan on admission showed a possible 37mm left kidney nodule, a large hiatal hernia, moderate stool in colon, and pancolonic diverticulosis. He is overdue  surveillance colonoscopy after having 5 tubular adenomas removed on screening colonoscopy with Dr. Henrene Pastor 4 years ago. Endoscopic evaluation in 2017 also revealed mild radiation proctitis and gastritis (results of CLO test not in EPIC).   Dramatic improvement in symptoms suggests that constipation may be driving his recent symptoms. Bleeding may be due to outlet source.   Plan: - Continue bowel prep today - Clear liquid diet today - NPO at midnight except for medications - EGD and colonoscopy tomorrow - Continue PPI BID and keeping the head of the bed elevated - Defer timing of MRI of abnormal kidney on CT to the primary team   Subjective: Patient seen at the bedside with his nurse. [place] interpreter used for history taking.   Completed half of the bowel prep for colonoscopy over night. Had at least 5 bowel movements and is feeling considerably better. Stools are still brown with formed stool.  Denies abdominal pain, nausea, vomiting, or anorexia. Nursing staff notes scant bright red blood with the stool.   No new complaints or concerns at this time.   No family present at the time of my evaluation.   Objective:  Vital signs in last 24 hours: Temp:  [97.6 F (36.4 C)-97.7 F (36.5 C)] 97.7 F (36.5 C) (11/14 0551) Pulse Rate:  [93] 93 (11/13 2120) Resp:  [10-16] 10 (11/14 0551) BP: (96-118)/(64-87) 108/64 (11/14 0551) SpO2:  [100 %] 100 % (11/13 2120) Weight:  [57.1 kg] 57.1 kg (11/14 0421)   General:    Alert, in NAD.  Heart:  Regular rate and rhythm; no murmurs Pulm: Clear anteriorly; no wheezing Abdomen:  Soft. Nontender. Nondistended. Normal bowel sounds. No rebound or guarding. LAD: No inguinal or umbilical LAD Extremities:  Without edema. Neurologic:  Alert and  oriented x4;  Possible dysarthria given the difficulty that the interpreter has in translating. Psych:  Alert and cooperative. Normal mood and affect.  Lab Results: Recent Labs    01/26/20 1017 01/27/20 0443 01/28/20 0124  WBC 5.3 5.0 4.6  HGB 9.5* 9.7* 8.9*  HCT 28.0* 28.5* 26.0*  PLT 301 294 282   BMET Recent Labs    01/26/20 1017 01/27/20 0443 01/28/20 0124  NA 137 137 139  K 4.7 4.7 5.0  CL 108 106 106  CO2 21* 23 24  GLUCOSE 92 85 88  BUN 35* 35* 30*  CREATININE 1.73* 2.08* 2.04*  CALCIUM 8.8* 9.0 8.7*   LFT Recent Labs    01/27/20 0443  PROT 6.6  ALBUMIN 3.3*  AST 47*  ALT 38  ALKPHOS 83  BILITOT 1.3*     LOS: 2 days   [place]  01/28/2020, 9:53 AM

## 2020-01-28 NOTE — Progress Notes (Signed)
° ° ° °  [facility] Progress Note  CC:  "I'm feeling better"  Assessment / Plan: Abdominal pain with recent complaints of nausea, vomiting, anorexia, and constipation with bright red blood per rectum after passing a hard stool. Has chronic anemia with guaiac + stools in the setting of recent epistaxis. CT scan on admission showed a possible 69mm left kidney nodule, a large hiatal hernia, moderate stool in colon, and pancolonic diverticulosis. He is overdue  surveillance colonoscopy after having 5 tubular adenomas removed on screening colonoscopy with Dr. Henrene Pastor 4 years ago. Endoscopic evaluation in 2017 also revealed mild radiation proctitis and gastritis (results of CLO test not in EPIC).   Dramatic improvement in symptoms suggests that constipation may be driving his recent symptoms. Bleeding may be due to outlet source.   Plan: - Continue bowel prep today - Clear liquid diet today - NPO at midnight except for medications - EGD and colonoscopy tomorrow - Continue PPI BID and keeping the head of the bed elevated - Defer timing of MRI of abnormal kidney on CT to the primary team   Subjective: Patient seen at the bedside with his nurse. [place] interpreter used for history taking.   Completed half of the bowel prep for colonoscopy over night. Had at least 5 bowel movements and is feeling considerably better. Stools are still brown with formed stool.  Denies abdominal pain, nausea, vomiting, or anorexia. Nursing staff notes scant bright red blood with the stool.   No new complaints or concerns at this time.   No family present at the time of my evaluation.   Objective:  Vital signs in last 24 hours: Temp:  [97.6 F (36.4 C)-97.7 F (36.5 C)] 97.7 F (36.5 C) (11/14 0551) Pulse Rate:  [93] 93 (11/13 2120) Resp:  [10-16] 10 (11/14 0551) BP: (96-118)/(64-87) 108/64 (11/14 0551) SpO2:  [100 %] 100 % (11/13 2120) Weight:  [57.1 kg] 57.1 kg (11/14 0421)   General:    Alert, in NAD.  Heart:  Regular rate and rhythm; no murmurs Pulm: Clear anteriorly; no wheezing Abdomen:  Soft. Nontender. Nondistended. Normal bowel sounds. No rebound or guarding. LAD: No inguinal or umbilical LAD Extremities:  Without edema. Neurologic:  Alert and  oriented x4;  Possible dysarthria given the difficulty that the interpreter has in translating. Psych:  Alert and cooperative. Normal mood and affect.  Lab Results: Recent Labs    01/26/20 1017 01/27/20 0443 01/28/20 0124  WBC 5.3 5.0 4.6  HGB 9.5* 9.7* 8.9*  HCT 28.0* 28.5* 26.0*  PLT 301 294 282   BMET Recent Labs    01/26/20 1017 01/27/20 0443 01/28/20 0124  NA 137 137 139  K 4.7 4.7 5.0  CL 108 106 106  CO2 21* 23 24  GLUCOSE 92 85 88  BUN 35* 35* 30*  CREATININE 1.73* 2.08* 2.04*  CALCIUM 8.8* 9.0 8.7*   LFT Recent Labs    01/27/20 0443  PROT 6.6  ALBUMIN 3.3*  AST 47*  ALT 38  ALKPHOS 83  BILITOT 1.3*     LOS: 2 days   [place]  01/28/2020, 9:53 AM

## 2020-01-28 NOTE — Progress Notes (Signed)
Family Medicine Teaching Service Daily Progress Note Intern Pager: [phone]  Patient name: [name] Medical record number: [id number] Date of birth: [birth year] Age: 74 y.o. Gender: male  Primary Care Provider: Matilde Haymaker, MD Consultants: GI Code Status: Full   Pt Overview and Major Events to Date:  Admitted 11/12 for abdominal pain   Assessment and Plan: [name] is a 74 y.o. male presenting with lower extremity edema and abdominal pain. PMH is significant for dementia, frequent falls, chronic subdural hematoma, CKD stage 3, and oropharyngeal dysphagia.   Of note, history is extremely limited as patient is vietnamese speaking only and interpreters are unable to understand patient, as he is edentulous.    Abdominal pain with constipation CT with contrast at [facility] 01/16/20 and here yesterday showed a possible 18mm left kidney nodule, large hiatal hernia, showed moderate stool in colon, and pancolonic diverticulosis.  This morning patient Pt states he had diarrhea and passed liquid stools 4 times.  Pt c/o mild/mod abdominal pain. Patient had a recent colonoscopy in 2017 which showed an adenomatous polyp and repeat colonoscopy was recommended 3 years.  He was FOBT positive during this admission which may be due to epistaxis. GI consult was done. Appreciate recommendations. GI recommended PPI BID and that he keep the head of the bed elevated as much as possible.  Aggressive bowel regimen and sending H pylori stool antigen ,ferritin and iron.  Also recommended MRI of abnormal kidney when his renal function improves. GI will do EGD and colonoscopy tomoorow. Today, Ferritin 118, Iron is low at 41, UIBC 221, Vitamin B12 normal at 437 and Folate at 14.6. Pt's Hb has decreased to 8.9 from 9.7. Swallow test was done yesterday and recommended liquid diet via cup, straw and small medications administered whole in puree and large medications cut/crushed in puree. -GI consulted. Appreciate recommendations. -Pt  is planned for EGD and Colonoscopy on 11/15.   -OOB with assistance -Vitals per routine -Liquid diet today and NPO from tonight for Endoscopy and Colonoscopy. -IV Protonix 40 mg twice daily -Renal ultrasound ordered today. -Renal Panel labs after Endoscopy and Colonoscopy.  Lower extremity edema There was concern for possible heart failure and he was given 40 mg Lasix in the ED.  He put out almost 2250 ml of urine in the last 24 hours.  His creatinine today is  2.04 (2.08 yesterday).  No further diuresis as ordered.  Physical exam showed trace lower extremity pitting edema. Echocardiogram was done and is essentially normal with LVEF  60 to 65%. size was normal in size.  -Continue to monitor lower extremity edema -Consider additional diuresis if Edema increases.   History of recurrent falls with recent fall PT, OT eval done. PT recommended 24 hr monitoring and disposition to SNF.  Skeletal survey shows no acute fractures. DG chest, pelvis, left upper and lower extremities within normal limits -Out of bed with assistance --Disposition to SNF.   Home situation with history of dementia and language barrier Patient requires 24-hour care but does not have a clear that that is occurring at home.  Daughter is contact and her numbers [phone] -Anticipate discharge to SNF -Transitions of care consult after PT/OT Recs   Anemia, macrocytic Hemoglobin 9.5 on admission with MCV 102.2.Today Hb is low at 8.9 with MCV of 100. Vitamin B12 (437) and Folate levels (14.6) normal. -Monitor CBC  -Continue to monitor for bloody stools or other source of bleeding -Continue PPI twice daily -Pt is planned for EGD and Colonoscopy  on 11/15. Will be NPO after midnight.   Tachycardia Today the pulse rate is 93/min Troponins stable at 7>7. -Continue to monitor    CKD III  GFR 40's at baseline. Creatinine 1.5 at baseline.Creatinine high at 2.04 this morning lower than yesterday 2.08.  No further diuresis at  this time. - AM BMP  - Avoid other neprhotoxic meds  -MRI of kidney inpatient or Outpatient when creatinine when renal functions are normal.  Hx of prostate cancer S/p radiation   FEN/GI: N.p.o. until speech eval and will advance after PPx: Lovenox  Subjective:  This morning patient Pt states he had diarrhea and passed liquid stools 4 times. Pt states he is hungry and wants to eat.   Objective: Temp:  [97.6 F (36.4 C)-97.7 F (36.5 C)] 97.7 F (36.5 C) (11/14 0551) Pulse Rate:  [93] 93 (11/13 2120) Resp:  [10-16] 10 (11/14 0551) BP: (96-118)/(64-87) 108/64 (11/14 0551) SpO2:  [100 %] 100 % (11/13 2120) Weight:  [57.1 kg] 57.1 kg (11/14 0421) Physical Exam: General: Not in acute stress Cardiovascular: S1 S2 normal, No murmurs Respiratory: Normal breathing sounds Abdomen:GeneralizedTenderness present  Extremities: B/L edema present.  Laboratory: Recent Labs  Lab 01/26/20 1017 01/27/20 0443 01/28/20 0124  WBC 5.3 5.0 4.6  HGB 9.5* 9.7* 8.9*  HCT 28.0* 28.5* 26.0*  PLT 301 294 282   Recent Labs  Lab 01/26/20 1017 01/27/20 0443 01/28/20 0124  NA 137 137 139  K 4.7 4.7 5.0  CL 108 106 106  CO2 21* 23 24  BUN 35* 35* 30*  CREATININE 1.73* 2.08* 2.04*  CALCIUM 8.8* 9.0 8.7*  PROT 6.5 6.6  --   BILITOT 0.8 1.3*  --   ALKPHOS 78 83  --   ALT 39 38  --   AST 50* 47*  --   GLUCOSE 92 85 88    Imaging/Diagnostic Tests: DG Chest 1 View  Result Date: 01/26/2020 CLINICAL DATA:  Fall EXAM: CHEST  1 VIEW COMPARISON:  05/25/2018, 04/30/2018 FINDINGS: No focal opacity or pleural effusion. Stable cardiomediastinal silhouette with right hilar fullness presumably due to vascular summation shadow. Aortic atherosclerosis. No pneumothorax. Chronic right fifth rib fracture IMPRESSION: No active disease. Electronically Signed   By: Donavan Foil M.D.   On: 01/26/2020 22:55   DG Pelvis 1-2 Views  Result Date: 01/26/2020 CLINICAL DATA:  Fall EXAM: PELVIS - 1-2 VIEW  COMPARISON:  11/01/2017 FINDINGS: Faint contrast within the urinary bladder. Clips in the region of the prostate. Pubic symphysis and rami appear intact. Both femoral heads project in joint. No fracture or malalignment IMPRESSION: No acute osseous abnormality Electronically Signed   By: Donavan Foil M.D.   On: 01/26/2020 22:46   DG Forearm Left  Result Date: 01/26/2020 CLINICAL DATA:  Fall EXAM: LEFT FOREARM - 2 VIEW COMPARISON:  None. FINDINGS: No acute displaced fracture or malalignment. No significant elbow effusion. Chronic appearing deformity of the first metacarpal IMPRESSION: No acute osseous abnormality. Electronically Signed   By: Donavan Foil M.D.   On: 01/26/2020 22:51   DG Tibia/Fibula Left  Result Date: 01/26/2020 CLINICAL DATA:  Encounter for fall EXAM: LEFT TIBIA AND FIBULA - 2 VIEW COMPARISON:  05/07/2014 FINDINGS: No fracture or malalignment.  Diffuse soft tissue edema. IMPRESSION: No acute osseous abnormality. Electronically Signed   By: Donavan Foil M.D.   On: 01/26/2020 22:47   DG Tibia/Fibula Right  Result Date: 01/26/2020 CLINICAL DATA:  Fall, right foreleg pain EXAM: RIGHT TIBIA AND FIBULA - 2  VIEW COMPARISON:  None. FINDINGS: Two view radiograph right foreleg demonstrates normal alignment. No fracture or dislocation. No destructive osseous lesion. There is diffuse subcutaneous edema involving the right foreleg. IMPRESSION: Diffuse subcutaneous edema.  No fracture or dislocation. Electronically Signed   By: Fidela Salisbury MD   On: 01/26/2020 22:46   CT Head Wo Contrast  Result Date: 01/26/2020 CLINICAL DATA:  74 year old male with altered mental status. EXAM: CT HEAD WITHOUT CONTRAST TECHNIQUE: Contiguous axial images were obtained from the base of the skull through the vertex without intravenous contrast. COMPARISON:  Head CT dated 03/22/2019. FINDINGS: Brain: There is mild age-related atrophy and chronic microvascular ischemic changes. Small chronic left frontal  subdural collection/hematoma measuring 3 mm in thickness was seen on the prior CT. No new hemorrhage. No mass effect or midline shift. Vascular: No hyperdense vessel or unexpected calcification. Skull: Normal. Negative for fracture or focal lesion. Sinuses/Orbits: No acute finding. Other: None IMPRESSION: 1. Stable small chronic left frontal subdural hemorrhage. No new bleed. 2. Mild age-related atrophy and chronic microvascular ischemic changes. Electronically Signed   By: Anner Crete M.D.   On: 01/26/2020 21:29   CT ABDOMEN PELVIS W CONTRAST  Result Date: 01/26/2020 CLINICAL DATA:  Lower extremity edema and abdominal pain EXAM: CT ABDOMEN AND PELVIS WITH CONTRAST TECHNIQUE: Multidetector CT imaging of the abdomen and pelvis was performed using the standard protocol following bolus administration of intravenous contrast. CONTRAST:  148mL OMNIPAQUE IOHEXOL 300 MG/ML  SOLN COMPARISON:  CT abdomen and pelvis January 16, 2020 FINDINGS: Lower chest: There is bibasilar atelectasis. No lung base edema or consolidation. There is a sizable hiatal hernia, stable. Hepatobiliary: There is a 7 mm cyst immediately to the left of the left portal vein within the liver. No other focal liver lesions are evident. Gallbladder appears upper normal in size without appreciable gallbladder wall thickening. There is no biliary duct dilatation. Pancreas: There is no pancreatic mass or inflammatory focus. Spleen: No splenic lesions are evident. Adrenals/Urinary Tract: Adrenals bilaterally appear normal. Renal cysts are noted bilaterally, stable. The previously noted focus of enhancement immediately adjacent to a cyst along the lateral left kidney measuring 8 mm is best seen on coronal slice 42 series 6 and is unchanged from recent prior study. No hydronephrosis is appreciable on either side. There is a 4 x 2 mm calculus in the lower pole of the left kidney. There is a 2 mm calculus in the upper pole of the left kidney. There is no  evident ureteral calculus on either side. Urinary bladder is midline with wall thickness within normal limits. Stomach/Bowel: There is moderate stool in the colon. There is no appreciable bowel wall or mesenteric thickening. Scattered colonic diverticula, most notably in the cecal region without diverticular inflammation. No appreciable bowel obstruction. Terminal ileum appears normal. There is no evident free air or portal venous air. Vascular/Lymphatic: No abdominal aortic aneurysm. There is aortic and iliac artery atherosclerosis. Major venous structures appear patent. No evident adenopathy in the abdomen or pelvis. Reproductive: There are surgical clips posterior to the prostate, stable. Prostate and seminal vesicles appear normal in size and contour. Other: Appendix appears normal no evident abscess or ascites in the abdomen or pelvis. Musculoskeletal: There is stable relative thickening of the left lateral internal oblique muscle compared to its counterpart on the right. The attenuation in this muscle appears normal without obvious hematoma or mass. No other muscle asymmetry is evident. There is degenerative change in the lumbar spine. No blastic or lytic  bone lesions are evident. Note that there is lower lumbar levoscoliosis. IMPRESSION: 1. 8 mm enhancing area lateral left kidney persists immediately adjacent to a cyst. Nonemergent MR to further evaluate this area remains advisable. A small neoplasm in this area cannot be excluded on this study. 2. Asymmetric thickening of the internal oblique muscle on the left laterally compared to the right. Significance of this finding uncertain. No hematoma or mass seen within this area of muscle asymmetry. 3. No bowel wall thickening or bowel obstruction. Scattered colonic diverticula without diverticulitis. No free air. Appendix appears normal. No abscess in the abdomen or pelvis. 4.  Sizable hiatal hernia remains. 5.  Aortic Atherosclerosis (ICD10-I70.0). 6. No renal  or ureteral calculus. No hydronephrosis. Urinary bladder wall thickness normal. Electronically Signed   By: Lowella Grip III M.D.   On: 01/26/2020 14:48   DG Hand 2 View Left  Result Date: 01/26/2020 CLINICAL DATA:  Fall EXAM: LEFT HAND - 2 VIEW COMPARISON:  None. FINDINGS: No acute displaced fracture or malalignment. Joint space narrowing and degenerative change at the fourth and fifth MCP joints. Chronic appearing deformity of the first metacarpal presumably due to prior trauma. IMPRESSION: No acute osseous abnormality. Chronic appearing deformity of the first metacarpal Electronically Signed   By: Donavan Foil M.D.   On: 01/26/2020 22:50   DG Humerus Left  Result Date: 01/26/2020 CLINICAL DATA:  Fall EXAM: LEFT HUMERUS - 2+ VIEW COMPARISON:  03/22/2019 FINDINGS: There is no evidence of fracture or other focal bone lesions. Soft tissues are unremarkable. IMPRESSION: Negative. Electronically Signed   By: Donavan Foil M.D.   On: 01/26/2020 22:48   ECHOCARDIOGRAM COMPLETE  Result Date: 01/27/2020    ECHOCARDIOGRAM REPORT   Patient Name:   [facility]  Date of Exam: 01/27/2020 Medical Rec #:  [id number]  Height:       65.0 in Accession #:    [id number] Weight:       131.6 lb Date of Birth:  [birth year]   BSA:          1.656 m Patient Age:    17 years   BP:           108/97 mmHg Patient Gender: M          HR:           70 bpm. Exam Location:  Inpatient Procedure: 2D Echo, Cardiac Doppler and Color Doppler Indications:   R94.31 Abnormal EKG  History:       Patient has no prior history of Echocardiogram examinations.                Cancer.  Sonographer:   Jonelle Sidle Dance Referring      [place]: IMPRESSIONS  1. Left ventricular ejection fraction, by estimation, is 60 to 65%. The left ventricle has normal function. The left ventricle has no regional wall motion abnormalities. Left ventricular diastolic parameters were normal.  2. Right ventricular systolic function is normal. The right  ventricular size is normal.  3. The mitral valve is normal in structure. No evidence of mitral valve regurgitation. No evidence of mitral stenosis.  4. The aortic valve is normal in structure. Aortic valve regurgitation is not visualized. No aortic stenosis is present.  5. The inferior vena cava is normal in size with greater than 50% respiratory variability, suggesting right atrial pressure of 3 mmHg. FINDINGS  Left Ventricle: Left ventricular ejection fraction, by estimation, is 60 to 65%. The left ventricle has  normal function. The left ventricle has no regional wall motion abnormalities. The left ventricular internal cavity size was normal in size. There is  no left ventricular hypertrophy. Left ventricular diastolic parameters were normal. Normal left ventricular filling pressure. Right Ventricle: The right ventricular size is normal. No increase in right ventricular wall thickness. Right ventricular systolic function is normal. Left Atrium: Left atrial size was normal in size. Right Atrium: Right atrial size was normal in size. Pericardium: There is no evidence of pericardial effusion. Mitral Valve: The mitral valve is normal in structure. No evidence of mitral valve regurgitation. No evidence of mitral valve stenosis. Tricuspid Valve: The tricuspid valve is normal in structure. Tricuspid valve regurgitation is not demonstrated. No evidence of tricuspid stenosis. Aortic Valve: The aortic valve is normal in structure. Aortic valve regurgitation is not visualized. No aortic stenosis is present. Pulmonic Valve: The pulmonic valve was normal in structure. Pulmonic valve regurgitation is not visualized. No evidence of pulmonic stenosis. Aorta: The aortic root is normal in size and structure. Venous: The inferior vena cava is normal in size with greater than 50% respiratory variability, suggesting right atrial pressure of 3 mmHg. IAS/Shunts: No atrial level shunt detected by color flow Doppler.  LEFT VENTRICLE PLAX  2D LVIDd:         2.75 cm LVIDs:         2.13 cm LV PW:         0.97 cm LV IVS:        0.94 cm LVOT diam:     2.20 cm LV SV:         78 LV SV Index:   47 LVOT Area:     3.80 cm  RIGHT VENTRICLE             IVC RV Basal diam:  2.20 cm     IVC diam: 1.47 cm RV S prime:     10.40 cm/s TAPSE (M-mode): 1.8 cm LEFT ATRIUM             Index       RIGHT ATRIUM           Index LA diam:        3.50 cm 2.11 cm/m  RA Area:     10.00 cm LA Vol (A2C):   26.5 ml 16.00 ml/m RA Volume:   17.90 ml  10.81 ml/m LA Vol (A4C):   48.9 ml 29.53 ml/m LA Biplane Vol: 35.9 ml 21.68 ml/m  AORTIC VALVE LVOT Vmax:   93.20 cm/s LVOT Vmean:  58.100 cm/s LVOT VTI:    0.206 m  AORTA Ao Root diam: 3.40 cm Ao Asc diam:  3.20 cm MITRAL VALVE MV Area (PHT): 3.27 cm     SHUNTS MV Decel Time: 232 msec     Systemic VTI:  0.21 m MV E velocity: 84.40 cm/s   Systemic Diam: 2.20 cm MV A velocity: 105.00 cm/s MV E/A ratio:  0.80 Mihai Croitoru MD Electronically signed by Sanda Klein MD Signature Date/Time: 01/27/2020/1:12:03 PM    Final    DG FEMUR 1V LEFT  Result Date: 01/26/2020 CLINICAL DATA:  Fall EXAM: LEFT FEMUR 1 VIEW COMPARISON:  05/07/2014 FINDINGS: There is no evidence of fracture or other focal bone lesions. Soft tissues are unremarkable. IMPRESSION: Negative. Electronically Signed   By: Donavan Foil M.D.   On: 01/26/2020 22:52   DG FEMUR 1V RIGHT  Result Date: 01/26/2020 CLINICAL DATA:  Fall EXAM: RIGHT FEMUR 1 VIEW COMPARISON:  05/07/2014 FINDINGS: There is no evidence of fracture or other focal bone lesions. Soft tissues are unremarkable. IMPRESSION: Negative. Electronically Signed   By: Donavan Foil M.D.   On: 01/26/2020 22:52   VAS [name] LOWER EXTREMITY VENOUS (DVT) (ONLY [facility] & [facility])  Result Date: 01/27/2020  Lower Venous DVT Study Indications: Swelling.  Limitations: Body habitus, poor ultrasound/tissue interface and contracted legs. Comparison Study: No prior study Performing Technologist: Maudry Mayhew MHA, RDMS,  RVT, RDCS  Examination Guidelines: A complete evaluation includes B-mode imaging, spectral Doppler, color Doppler, and power Doppler as needed of all accessible portions of each vessel. Bilateral testing is considered an integral part of a complete examination. Limited examinations for reoccurring indications may be performed as noted. The reflux portion of the exam is performed with the patient in reverse Trendelenburg.  +---------+---------------+---------+-----------+----------+--------------+ RIGHT    CompressibilityPhasicitySpontaneityPropertiesThrombus Aging +---------+---------------+---------+-----------+----------+--------------+ CFV                     Yes      Yes        patent                   +---------+---------------+---------+-----------+----------+--------------+ SFJ                              Yes        patent                   +---------+---------------+---------+-----------+----------+--------------+ FV Prox                          Yes        patent                   +---------+---------------+---------+-----------+----------+--------------+ FV Mid                           Yes        patent                   +---------+---------------+---------+-----------+----------+--------------+ FV Distal                        Yes                                 +---------+---------------+---------+-----------+----------+--------------+ POP      Full           Yes      Yes        patent                   +---------+---------------+---------+-----------+----------+--------------+   Right Technical Findings: Not visualized segments include PFV, PTV, peroneal veins.  +---------+---------------+---------+-----------+----------+--------------+ LEFT     CompressibilityPhasicitySpontaneityPropertiesThrombus Aging +---------+---------------+---------+-----------+----------+--------------+ FV Mid   Full                    Yes        patent                    +---------+---------------+---------+-----------+----------+--------------+ FV Distal                        Yes        patent                   +---------+---------------+---------+-----------+----------+--------------+  POP      Full           Yes      Yes        patent                   +---------+---------------+---------+-----------+----------+--------------+ PTV      Full                               patent                   +---------+---------------+---------+-----------+----------+--------------+ PERO     Full                               patent                   +---------+---------------+---------+-----------+----------+--------------+   Left Technical Findings: Not visualized segments include CFV, SFJ, FV proximal, PFV,.   Summary: RIGHT: - There is no evidence of deep vein thrombosis in the lower extremity. However, portions of this examination were limited- see technologist comments above.  - No cystic structure found in the popliteal fossa.  LEFT: - There is no evidence of deep vein thrombosis in the lower extremity. However, portions of this examination were limited- see technologist comments above.  - No cystic structure found in the popliteal fossa.  *See table(s) above for measurements and observations. Electronically signed by Ruta Hinds MD on 01/27/2020 at 11:50:15 AM.    Final     Armando Reichert, MD 01/28/2020, 6:23 AM PGY-1, [place] Intern pager: [phone], text pages welcome

## 2020-01-28 NOTE — Progress Notes (Signed)
Patient consent obtained using [employer] interpreting video service ID # [id number]

## 2020-01-29 ENCOUNTER — Encounter (HOSPITAL_COMMUNITY): Payer: Self-pay | Admitting: Family Medicine

## 2020-01-29 ENCOUNTER — Encounter (HOSPITAL_COMMUNITY)
Admission: EM | Disposition: A | Discharge status: Home Health | Payer: Self-pay | Source: Home / Self Care | Attending: Family Medicine

## 2020-01-29 ENCOUNTER — Inpatient Hospital Stay (HOSPITAL_COMMUNITY): Payer: Medicare Other | Admitting: Anesthesiology

## 2020-01-29 DIAGNOSIS — K5900 Constipation, unspecified: Secondary | ICD-10-CM | POA: Diagnosis not present

## 2020-01-29 DIAGNOSIS — N179 Acute kidney failure, unspecified: Secondary | ICD-10-CM | POA: Diagnosis not present

## 2020-01-29 DIAGNOSIS — R710 Precipitous drop in hematocrit: Principal | ICD-10-CM

## 2020-01-29 DIAGNOSIS — K859 Acute pancreatitis without necrosis or infection, unspecified: Secondary | ICD-10-CM | POA: Diagnosis not present

## 2020-01-29 DIAGNOSIS — D509 Iron deficiency anemia, unspecified: Secondary | ICD-10-CM | POA: Diagnosis not present

## 2020-01-29 DIAGNOSIS — K5521 Angiodysplasia of colon with hemorrhage: Principal | ICD-10-CM

## 2020-01-29 DIAGNOSIS — K625 Hemorrhage of anus and rectum: Secondary | ICD-10-CM | POA: Diagnosis not present

## 2020-01-29 DIAGNOSIS — K627 Radiation proctitis: Secondary | ICD-10-CM

## 2020-01-29 DIAGNOSIS — R195 Other fecal abnormalities: Secondary | ICD-10-CM | POA: Diagnosis not present

## 2020-01-29 DIAGNOSIS — K921 Melena: Secondary | ICD-10-CM | POA: Diagnosis not present

## 2020-01-29 DIAGNOSIS — I6203 Nontraumatic chronic subdural hemorrhage: Secondary | ICD-10-CM | POA: Diagnosis not present

## 2020-01-29 DIAGNOSIS — D125 Benign neoplasm of sigmoid colon: Secondary | ICD-10-CM

## 2020-01-29 DIAGNOSIS — K219 Gastro-esophageal reflux disease without esophagitis: Secondary | ICD-10-CM

## 2020-01-29 HISTORY — PX: COLONOSCOPY WITH PROPOFOL: SHX5780

## 2020-01-29 HISTORY — PX: HOT HEMOSTASIS: SHX5433

## 2020-01-29 HISTORY — PX: ESOPHAGOGASTRODUODENOSCOPY (EGD) WITH PROPOFOL: SHX5813

## 2020-01-29 HISTORY — PX: BIOPSY: SHX5522

## 2020-01-29 LAB — CBC WITH DIFFERENTIAL/PLATELET
Abs Immature Granulocytes: 0.02 10*3/uL (ref 0.00–0.07)
Basophils Absolute: 0.1 10*3/uL (ref 0.0–0.1)
Basophils Relative: 1 %
Eosinophils Absolute: 0.3 10*3/uL (ref 0.0–0.5)
Eosinophils Relative: 7 %
HCT: 24.4 % — ABNORMAL LOW (ref 39.0–52.0)
Hemoglobin: 8.3 g/dL — ABNORMAL LOW (ref 13.0–17.0)
Immature Granulocytes: 0 %
Lymphocytes Relative: 26 %
Lymphs Abs: 1.2 10*3/uL (ref 0.7–4.0)
MCH: 34.3 pg — ABNORMAL HIGH (ref 26.0–34.0)
MCHC: 34 g/dL (ref 30.0–36.0)
MCV: 100.8 fL — ABNORMAL HIGH (ref 80.0–100.0)
Monocytes Absolute: 0.5 10*3/uL (ref 0.1–1.0)
Monocytes Relative: 11 %
Neutro Abs: 2.6 10*3/uL (ref 1.7–7.7)
Neutrophils Relative %: 55 %
Platelets: 273 10*3/uL (ref 150–400)
RBC: 2.42 MIL/uL — ABNORMAL LOW (ref 4.22–5.81)
RDW: 12.2 % (ref 11.5–15.5)
WBC: 4.7 10*3/uL (ref 4.0–10.5)
nRBC: 0 % (ref 0.0–0.2)

## 2020-01-29 LAB — BASIC METABOLIC PANEL
Anion gap: 10 (ref 5–15)
BUN: 13 mg/dL (ref 8–23)
CO2: 23 mmol/L (ref 22–32)
Calcium: 8.6 mg/dL — ABNORMAL LOW (ref 8.9–10.3)
Chloride: 107 mmol/L (ref 98–111)
Creatinine, Ser: 1.8 mg/dL — ABNORMAL HIGH (ref 0.61–1.24)
GFR, Estimated: 39 mL/min — ABNORMAL LOW (ref >60)
Glucose, Bld: 115 mg/dL — ABNORMAL HIGH (ref 70–99)
Potassium: 4.1 mmol/L (ref 3.5–5.1)
Sodium: 140 mmol/L (ref 135–145)

## 2020-01-29 SURGERY — ESOPHAGOGASTRODUODENOSCOPY (EGD) WITH PROPOFOL
Anesthesia: Monitor Anesthesia Care

## 2020-01-29 MED ORDER — PROPOFOL 500 MG/50ML IV EMUL
INTRAVENOUS | Status: DC | PRN
  Administered 2020-01-29: 75 ug/kg/min via INTRAVENOUS

## 2020-01-29 MED ORDER — SODIUM CHLORIDE 0.9 % IV SOLN: INTRAVENOUS | Status: DC

## 2020-01-29 MED ORDER — PROPOFOL 10 MG/ML IV BOLUS
INTRAVENOUS | Status: DC | PRN
  Administered 2020-01-29 (×2): 20 mg via INTRAVENOUS

## 2020-01-29 MED ORDER — EPHEDRINE SULFATE-NACL 50-0.9 MG/10ML-% IV SOSY
PREFILLED_SYRINGE | INTRAVENOUS | Status: DC | PRN
  Administered 2020-01-29: 10 mg via INTRAVENOUS

## 2020-01-29 MED ORDER — PHENYLEPHRINE HCL-NACL 10-0.9 MG/250ML-% IV SOLN
INTRAVENOUS | Status: DC | PRN
  Administered 2020-01-29: 25 ug/min via INTRAVENOUS

## 2020-01-29 MED ORDER — PHENYLEPHRINE 40 MCG/ML (10ML) SYRINGE FOR IV PUSH (FOR BLOOD PRESSURE SUPPORT)
PREFILLED_SYRINGE | INTRAVENOUS | Status: DC | PRN
  Administered 2020-01-29 (×2): 120 ug via INTRAVENOUS

## 2020-01-29 SURGICAL SUPPLY — 25 items

## 2020-01-29 NOTE — Hospital Course (Addendum)
  Brief Hospital Course:   Abdominal Pain Constipation Pt presented with abdominal pain x3 days. In the ED patient's vitals were stable. His abdomen is firm on exam and mildly tender in the LUQ. He also had B/L lower extremity edema.  Rectal exam demonstrated extremely hard stool, FOBT+. CBC remarkable for Hgb 9.5 with MCV 102.2 but otherwise normal. CMP showed Cr 1.73, albumin 3.3, AST 50, and lipase elevated at 108. CT abdomen pelvis obtained in the ED showed asymmetric thickening of internal oblique muscle on left and an 58mm enhancing area on lateral left kidney unchanged from prior. Endoscopy was done shows moderate sliding hiatal hernia. Colonoscopy was done showed A 3 mm polyp was removed from sigmoid colon. Multiple diverticula were found in the sigmoid colon and right colon. Several angioectasias with moderate but definite bleeding were found in the rectum. Coagulation for hemostasis using argon beam was successful. Surgical pathology shows Tubular adenoma and Negative for high-grade dysplasia or malignancy  His vitals remain stable during his course. Swallow study was done and recommended Diet as chopped solids and nectar thick liquids and giving oral medications with puree.  Pt was started on Iron supplements. Hb is at discharge is stable at 9.8.   Lower Extremity Edema Pt has B/L lower extremity edema at admission. He was given Lasix 40mg  IV x1 in the ED  Bilateral lower extremity ultrasounds were negative for DVT. BNP 28.5 Echocardiogram was  essentially normal. His creatinine at discharge is 1.97 improved from 2.08 at admission. Pt LEE improved during his course.  Fall  Hx of Recurrent falls Chronic subdural hematoma Per chart review, patient with recurrent falls dating back to 2019. Also has chronic subdural hematoma that neurosurgery deemed to be nonsurgical. CT Head was done which showed stable small chronic left frontal subdural hemorrhage. No new bleed. Mild age-related atrophy and  chronic microvascular ischemic changes.     CKD III  His creatinine at discharge is 1.97 improved from 2.08 at admission with fluids.

## 2020-01-29 NOTE — Progress Notes (Signed)
   01/28/20 2000  Assess: MEWS Score  Temp 98.3 F (36.8 C)  BP 100/68  Pulse Rate 80  ECG Heart Rate 90  Resp 16  Level of Consciousness Alert  SpO2 97 %  O2 Device Room Air  Assess: MEWS Score  MEWS Temp 0  MEWS Systolic 1  MEWS Pulse 0  MEWS RR 0  MEWS LOC 0  MEWS Score 1  MEWS Score Color Green  Treat  Pain Scale 0-10  Pain Score 0

## 2020-01-29 NOTE — Op Note (Signed)
[facility] Patient Name: [name] Procedure Date : 01/29/2020 MRN: [id number] Attending MD: Docia Chuck. Henrene Pastor MD, MD Date of Birth: 07/01/1945 CSN: [id number] Age: 74 Admit Type: Inpatient Procedure:                Colonoscopy with cold snare polypectomy x 1; with                            control of bleeding Indications:              Rectal bleeding, Iron deficiency anemia. Also,                            prior history of colonoscopy August 2017 with                            multiple (greater than 3) adenomas. Providers:                Docia Chuck. Henrene Pastor MD, MD, Erenest Rasher, RN, Cletis Athens, Technician, Lesia Sago, Technician,                            Cira Servant, CRNA Referring MD:             [facility] Medicines:                Monitored Anesthesia Care Complications:            No immediate complications. Estimated blood loss:                            None. Estimated Blood Loss:     Estimated blood loss: none. Procedure:                Pre-Anesthesia Assessment:                           - Prior to the procedure, a History and Physical                            was performed, and patient medications and                            allergies were reviewed. The patient's tolerance of                            previous anesthesia was also reviewed. The risks                            and benefits of the procedure and the sedation                            options and risks were discussed with the patient.  All questions were answered, and informed consent                            was obtained. Prior Anticoagulants: The patient has                            taken no previous anticoagulant or antiplatelet                            agents. After reviewing the risks and benefits, the                            patient was deemed in satisfactory condition to                            undergo the  procedure.                           After obtaining informed consent, the colonoscope                            was passed under direct vision. Throughout the                            procedure, the patient's blood pressure, pulse, and                            oxygen saturations were monitored continuously. The                            PCF-H190DL ([id number] Olympus pediatric colonoscope                            was introduced through the anus and advanced to the                            the cecum, identified by appendiceal orifice and                            ileocecal valve. The ileocecal valve, appendiceal                            orifice, and rectum were photographed. The quality                            of the bowel preparation was excellent. The                            colonoscopy was performed without difficulty. The                            patient tolerated the procedure well. The bowel  preparation used was SUPREP via split dose                            instruction. Scope In: 11:35:48 AM Scope Out: 11:53:43 AM Scope Withdrawal Time: 0 hours 15 minutes 18 seconds  Total Procedure Duration: 0 hours 17 minutes 55 seconds  Findings:      A 3 mm polyp was found in the sigmoid colon. The polyp was removed with       a cold snare. Resection and retrieval were complete.      Multiple diverticula were found in the sigmoid colon and right colon.      Several angioectasias with moderate but definite bleeding were found in       the rectum. Coagulation for hemostasis using argon beam was successful.      The exam was otherwise without abnormality on direct views. Rectal vault       to narrow for safe retroflexion of adult scope. Impression:               - One 3 mm polyp in the sigmoid colon, removed with                            a cold snare. Resected and retrieved.                           - Diverticulosis in the sigmoid colon and in  the                            right colon.                           - Multiple bleeding colonic angioectasias. Treated                            with argon beam coagulation.                           - The examination was otherwise normal on direct                            and retroflexion views. Recommendation:           - Repeat colonoscopy in 5 years for surveillance.                           - Patient has a contact number available for                            emergencies. The signs and symptoms of potential                            delayed complications were discussed with the                            patient. Return to normal activities tomorrow.  Written discharge instructions were provided to the                            patient.                           - Resume previous diet.                           - Continue present medications.                           - Await pathology results.                           -The patient should be on a daily iron supplement                            indefinitely to reduce the risk of iron deficiency                            anemia associated with intermittent chronic blood                            loss from radiation proctitis                           -Advance diet.                           -He should follow-up with his PCP to monitor blood                            counts periodically                           -No specific outpatient GI follow-up required                           -See EGD regarding findings and final                            recommendations Procedure Code(s):        --- Professional ---                           [phone], 59, Colonoscopy, flexible; with control of                            bleeding, any method                           45385, Colonoscopy, flexible; with removal of                            tumor(s), polyp(s), or other lesion(s) by snare  technique Diagnosis Code(s):        --- Professional ---                           K63.5, Polyp of colon                           K55.21, Angiodysplasia of colon with hemorrhage                           K62.5, Hemorrhage of anus and rectum                           D50.9, Iron deficiency anemia, unspecified                           K57.30, Diverticulosis of large intestine without                            perforation or abscess without bleeding CPT copyright 2019 American [employer]. All rights reserved. The codes documented in this report are preliminary and upon coder review may  be revised to meet current compliance requirements. Docia Chuck. Henrene Pastor MD, MD 01/29/2020 12:10:27 PM This report has been signed electronically. Number of Addenda: 0

## 2020-01-29 NOTE — Interval H&P Note (Signed)
History and Physical Interval Note:  01/29/2020 11:19 AM  [name]  has presented today for surgery, with the diagnosis of Anemia.  The various methods of treatment have been discussed with the patient and family. After consideration of risks, benefits and other options for treatment, the patient has consented to  Procedure(s): ESOPHAGOGASTRODUODENOSCOPY (EGD) WITH PROPOFOL (N/A) COLONOSCOPY WITH PROPOFOL (N/A) as a surgical intervention.  The patient's history has been reviewed, patient examined, no change in status, stable for surgery.  I have reviewed the patient's chart and labs.  Questions were answered to the patient's satisfaction.     Scarlette Shorts

## 2020-01-29 NOTE — Progress Notes (Signed)
SLP Cancellation Note  Patient Details Name: [name] MRN: [id number] DOB: [birth year]   Cancelled treatment:        NPO for EGD and colonoscopy.  Will continue efforts.  Mana Haberl L. Tivis Ringer, [place] Office number [phone] Pager [phone]    Juan Quam Laurice 01/29/2020, 8:54 AM

## 2020-01-29 NOTE — Op Note (Signed)
[facility] Patient Name: [name] Procedure Date : 01/29/2020 MRN: [id number] Attending MD: Docia Chuck. Henrene Pastor MD, MD Date of Birth: 19-Feb-1946 CSN: [id number] Age: 74 Admit Type: Inpatient Procedure:                Upper GI endoscopy Indications:              Prostatic anemia, Heme positive stool, GERD Providers:                Docia Chuck. Henrene Pastor MD, MD, Erenest Rasher, RN, Cletis [place], Technician, Lesia Sago, Technician,                            Cira Servant, CRNA Referring MD:             [facility] hospital[facility] Medicines:                Monitored Anesthesia Care Complications:            No immediate complications. Estimated Blood Loss:     Estimated blood loss: none. Procedure:                Pre-Anesthesia Assessment:                           - Prior to the procedure, a History and Physical                            was performed, and patient medications and                            allergies were reviewed. The patient's tolerance of                            previous anesthesia was also reviewed. The risks                            and benefits of the procedure and the sedation                            options and risks were discussed with the patient.                            All questions were answered, and informed consent                            was obtained. Prior Anticoagulants: The patient has                            taken no previous anticoagulant or antiplatelet                            agents. After reviewing the risks and benefits, the  patient was deemed in satisfactory condition to                            undergo the procedure.                           After obtaining informed consent, the endoscope was                            passed under direct vision. Throughout the                            procedure, the patient's blood pressure, pulse, and                            oxygen  saturations were monitored continuously. The                            GIF-H190 ([id number] Olympus gastroscope was                            introduced through the mouth, and advanced to the                            second part of duodenum. The upper GI endoscopy was                            accomplished without difficulty. The patient                            tolerated the procedure well. Scope In: Scope Out: Findings:      The esophagus was normal.      The stomach was normal save moderate sliding hiatal hernia.      The examined duodenum was normal.      The cardia and gastric fundus were normal on retroflexion. Impression:               1. Hiatal hernia. Otherwise normal EGD                           2. GERD Recommendation:           1. Reflux precautions                           2. Continue PPI                           3. Advance diet                           4. Outpatient follow-up with PCP. No GI outpatient                            follow-up required at this point  We will sign off. Thank you Procedure Code(s):        --- Professional ---                           [phone], Esophagogastroduodenoscopy, flexible,                            transoral; diagnostic, including collection of                            specimen(s) by brushing or washing, when performed                            (separate procedure) Diagnosis Code(s):        --- Professional ---                           D50.9, Iron deficiency anemia, unspecified                           R19.5, Other fecal abnormalities CPT copyright 2019 American [employer] Association. All rights reserved. The codes documented in this report are preliminary and upon coder review may  be revised to meet current compliance requirements. Docia Chuck. Henrene Pastor MD, MD 01/29/2020 12:15:19 PM This report has been signed electronically. Number of Addenda: 0

## 2020-01-29 NOTE — Anesthesia Postprocedure Evaluation (Signed)
Anesthesia Post Note  Patient: [name]  Procedure(s) Performed: ESOPHAGOGASTRODUODENOSCOPY (EGD) WITH PROPOFOL (N/A ) COLONOSCOPY WITH PROPOFOL (N/A ) HOT HEMOSTASIS (ARGON PLASMA COAGULATION/BICAP) (N/A ) BIOPSY     Patient location during evaluation: PACU Anesthesia Type: MAC Level of consciousness: awake and alert Pain management: pain level controlled Vital Signs Assessment: post-procedure vital signs reviewed and stable Respiratory status: spontaneous breathing, nonlabored ventilation and respiratory function stable Cardiovascular status: blood pressure returned to baseline and stable Postop Assessment: no apparent nausea or vomiting Anesthetic complications: no   No complications documented.  Last Vitals:  Vitals:   01/29/20 1236 01/29/20 1252  BP: (!) 100/56 120/76  Pulse: 78 70  Resp: 13 15  Temp:    SpO2: 99% 98%    Last Pain:  Vitals:   01/29/20 1236  TempSrc:   PainSc: 0-No pain                 Pervis Hocking

## 2020-01-29 NOTE — Progress Notes (Signed)
Family Medicine Teaching Service Daily Progress Note Intern Pager: [phone]  Patient name: [name] Medical record number: [id number] Date of birth: Aug 16, 1945 Age: 74 y.o. Gender: male  Primary Care Provider: Matilde Haymaker, MD Consultants:GI Code Status:Full  Pt Overview and Major Events to Date: Admitted 11/12 for abdominal pain  Assessment and Plan: [name] a 74 y.o.malepresenting with lower extremity edema and abdominal pain. PMH is significant fordementia, frequent falls, chronic subdural hematoma, CKD stage 3, and oropharyngeal dysphagia.  Of note, history is extremely limited as patient is [place]et[place] speaking only and interpreters are unable to understand patient, as he is edentulous.  Abdominal pain with constipation CT with contrast at [facility] 01/16/20 and here yesterday showed a possible 65mm left kidney nodule, large hiatal hernia, showed moderate stool in colon, and pancolonic diverticulosis. Patient had a recent colonoscopy in 2017 which showed an adenomatous polyp and repeat colonoscopy was recommended 3 years.  This morning patient Pt denies any abdominal pain, diarhoea, nausea and vomiting.  He was FOBT positive during this admission. His Iron was low at 41 with normal Vitamin B12 and Folate. Pt's Hb has decreased to 8.3 from 8.9 since yesterday and 9.7 at admission. MCV is 100.8. Renal ultrasound showed Mild bilateral cortical thinning of the kidneys, Bilateral benign-appearing renal cysts, 4.4 cm cluster of complicated cysts versus complex cystic mass off the lower outer cortex of the left kidney. Pt is scheduled for EGD and Colonoscopy today.  -GI consulted. Appreciate recommendations.  - Will F/U with EGD and Colonoscopy Findings. -Pt is planned for EGD and Colonoscopy today   -OOB with assistance  -Vitals per routine -Pt is NPO now  for Endoscopy and Colonoscopy. -IV Protonix 40 mg twice daily -Renal Panel labs after Endoscopy and Colonoscopy.  Lower  extremity edema B/L Edema present. There was concern for possible heart failure and he was given 40 mg Lasix in the ED. He put out  1050 ml of urine in the last 24 hours with Net intake of 1001.3. His creatinine yesterday was 2.04.   Echocardiogram was  essentially normal -Continue to monitor lower extremity edema -Consider additional diuresis if Edema increases.  History of recurrent falls with recent fall PT, OT eval done recommended SNF placement. -Out of bed with assistance --Disposition to SNF.  Home situation with history of dementia and language barrier Patient requires 24-hour care but does not have a clear that that is occurring at home.  -Anticipate discharge to SNF -Transitions of care consult at D/S  Anemia, macrocytic Hemoglobin 9.5 on admission with MCV 102.2.Today Hb is low at 8.3 with MCV of 100.8. Vitamin B12 and Folate levels normal. -Monitor CBC  -Continue to monitor for bloody stools or other source of bleeding -Continue PPI twice daily -Pt is planned for EGD and Colonoscopy today.  Tachycardia (Resolved) Today the pulse rate is 74/minTroponins stable at 7>7. -Continue to monitor  CKD III  GFR 40's at baseline. Creatinine 1.5 at baseline.Creatinine high yesterday 2.04. Today Creatinine is No further diuresis at this time. - Monitor BMP daily - Avoid other neprhotoxic meds  -MRI of kidney inpatient or Outpatient when creatinine when renal functions are normal.  Hx of prostate cancer S/p radiation  FEN/GI:N.p.o. until speech eval and will advance after GHW:EXHBZJI Subjective:  This morning patient Pt denies any abdominal pain, diarhoea, nausea and vomiting.  Denies any other complaints.  Objective: Temp:  [97.7 F (36.5 C)-98.6 F (37 C)] 97.8 F (36.6 C) (11/15 0343) Pulse Rate:  [74-92] 74 (11/15  0343) Resp:  [10-20] 16 (11/15 0343) BP: (100-116)/(64-81) 114/78 (11/15 0343) SpO2:  [97 %-100 %] 100 % (11/15 0343) Weight:  [57.9 kg]  57.9 kg (11/15 0011) Physical Exam: General: Not in acute stress.  Cardiovascular: S1 S2 normal, No murmurs Respiratory: Normal breathing sounds Abdomen:Non tender, BS +nt Extremities: B/L edema present.  Laboratory: Recent Labs  Lab 01/27/20 0443 01/28/20 0124 01/29/20 0425  WBC 5.0 4.6 4.7  HGB 9.7* 8.9* 8.3*  HCT 28.5* 26.0* 24.4*  PLT 294 282 273   Recent Labs  Lab 01/26/20 1017 01/27/20 0443 01/28/20 0124  NA 137 137 139  K 4.7 4.7 5.0  CL 108 106 106  CO2 21* 23 24  BUN 35* 35* 30*  CREATININE 1.73* 2.08* 2.04*  CALCIUM 8.8* 9.0 8.7*  PROT 6.5 6.6  --   BILITOT 0.8 1.3*  --   ALKPHOS 78 83  --   ALT 39 38  --   AST 50* 47*  --   GLUCOSE 92 85 88      Imaging/Diagnostic Tests: DG Chest 1 View  Result Date: 01/26/2020 CLINICAL DATA:  Fall EXAM: CHEST  1 VIEW COMPARISON:  05/25/2018, 04/30/2018 FINDINGS: No focal opacity or pleural effusion. Stable cardiomediastinal silhouette with right hilar fullness presumably due to vascular summation shadow. Aortic atherosclerosis. No pneumothorax. Chronic right fifth rib fracture IMPRESSION: No active disease. Electronically Signed   By: Donavan Foil M.D.   On: 01/26/2020 22:55   DG Pelvis 1-2 Views  Result Date: 01/26/2020 CLINICAL DATA:  Fall EXAM: PELVIS - 1-2 VIEW COMPARISON:  11/01/2017 FINDINGS: Faint contrast within the urinary bladder. Clips in the region of the prostate. Pubic symphysis and rami appear intact. Both femoral heads project in joint. No fracture or malalignment IMPRESSION: No acute osseous abnormality Electronically Signed   By: Donavan Foil M.D.   On: 01/26/2020 22:46   DG Forearm Left  Result Date: 01/26/2020 CLINICAL DATA:  Fall EXAM: LEFT FOREARM - 2 VIEW COMPARISON:  None. FINDINGS: No acute displaced fracture or malalignment. No significant elbow effusion. Chronic appearing deformity of the first metacarpal IMPRESSION: No acute osseous abnormality. Electronically Signed   By: Donavan Foil  M.D.   On: 01/26/2020 22:51   DG Tibia/Fibula Left  Result Date: 01/26/2020 CLINICAL DATA:  Encounter for fall EXAM: LEFT TIBIA AND FIBULA - 2 VIEW COMPARISON:  05/07/2014 FINDINGS: No fracture or malalignment.  Diffuse soft tissue edema. IMPRESSION: No acute osseous abnormality. Electronically Signed   By: Donavan Foil M.D.   On: 01/26/2020 22:47   DG Tibia/Fibula Right  Result Date: 01/26/2020 CLINICAL DATA:  Fall, right foreleg pain EXAM: RIGHT TIBIA AND FIBULA - 2 VIEW COMPARISON:  None. FINDINGS: Two view radiograph right foreleg demonstrates normal alignment. No fracture or dislocation. No destructive osseous lesion. There is diffuse subcutaneous edema involving the right foreleg. IMPRESSION: Diffuse subcutaneous edema.  No fracture or dislocation. Electronically Signed   By: Fidela Salisbury MD   On: 01/26/2020 22:46   CT Head Wo Contrast  Result Date: 01/26/2020 CLINICAL DATA:  74 year old male with altered mental status. EXAM: CT HEAD WITHOUT CONTRAST TECHNIQUE: Contiguous axial images were obtained from the base of the skull through the vertex without intravenous contrast. COMPARISON:  Head CT dated 03/22/2019. FINDINGS: Brain: There is mild age-related atrophy and chronic microvascular ischemic changes. Small chronic left frontal subdural collection/hematoma measuring 3 mm in thickness was seen on the prior CT. No new hemorrhage. No mass effect or midline shift. Vascular: No  hyperdense vessel or unexpected calcification. Skull: Normal. Negative for fracture or focal lesion. Sinuses/Orbits: No acute finding. Other: None IMPRESSION: 1. Stable small chronic left frontal subdural hemorrhage. No new bleed. 2. Mild age-related atrophy and chronic microvascular ischemic changes. Electronically Signed   By: Anner Crete M.D.   On: 01/26/2020 21:29   CT ABDOMEN PELVIS W CONTRAST  Result Date: 01/26/2020 CLINICAL DATA:  Lower extremity edema and abdominal pain EXAM: CT ABDOMEN AND PELVIS WITH  CONTRAST TECHNIQUE: Multidetector CT imaging of the abdomen and pelvis was performed using the standard protocol following bolus administration of intravenous contrast. CONTRAST:  155mL OMNIPAQUE IOHEXOL 300 MG/ML  SOLN COMPARISON:  CT abdomen and pelvis January 16, 2020 FINDINGS: Lower chest: There is bibasilar atelectasis. No lung base edema or consolidation. There is a sizable hiatal hernia, stable. Hepatobiliary: There is a 7 mm cyst immediately to the left of the left portal vein within the liver. No other focal liver lesions are evident. Gallbladder appears upper normal in size without appreciable gallbladder wall thickening. There is no biliary duct dilatation. Pancreas: There is no pancreatic mass or inflammatory focus. Spleen: No splenic lesions are evident. Adrenals/Urinary Tract: Adrenals bilaterally appear normal. Renal cysts are noted bilaterally, stable. The previously noted focus of enhancement immediately adjacent to a cyst along the lateral left kidney measuring 8 mm is best seen on coronal slice 42 series 6 and is unchanged from recent prior study. No hydronephrosis is appreciable on either side. There is a 4 x 2 mm calculus in the lower pole of the left kidney. There is a 2 mm calculus in the upper pole of the left kidney. There is no evident ureteral calculus on either side. Urinary bladder is midline with wall thickness within normal limits. Stomach/Bowel: There is moderate stool in the colon. There is no appreciable bowel wall or mesenteric thickening. Scattered colonic diverticula, most notably in the cecal region without diverticular inflammation. No appreciable bowel obstruction. Terminal ileum appears normal. There is no evident free air or portal venous air. Vascular/Lymphatic: No abdominal aortic aneurysm. There is aortic and iliac artery atherosclerosis. Major venous structures appear patent. No evident adenopathy in the abdomen or pelvis. Reproductive: There are surgical clips posterior  to the prostate, stable. Prostate and seminal vesicles appear normal in size and contour. Other: Appendix appears normal no evident abscess or ascites in the abdomen or pelvis. Musculoskeletal: There is stable relative thickening of the left lateral internal oblique muscle compared to its counterpart on the right. The attenuation in this muscle appears normal without obvious hematoma or mass. No other muscle asymmetry is evident. There is degenerative change in the lumbar spine. No blastic or lytic bone lesions are evident. Note that there is lower lumbar levoscoliosis. IMPRESSION: 1. 8 mm enhancing area lateral left kidney persists immediately adjacent to a cyst. Nonemergent MR to further evaluate this area remains advisable. A small neoplasm in this area cannot be excluded on this study. 2. Asymmetric thickening of the internal oblique muscle on the left laterally compared to the right. Significance of this finding uncertain. No hematoma or mass seen within this area of muscle asymmetry. 3. No bowel wall thickening or bowel obstruction. Scattered colonic diverticula without diverticulitis. No free air. Appendix appears normal. No abscess in the abdomen or pelvis. 4.  Sizable hiatal hernia remains. 5.  Aortic Atherosclerosis (ICD10-I70.0). 6. No renal or ureteral calculus. No hydronephrosis. Urinary bladder wall thickness normal. Electronically Signed   By: Lowella Grip III M.D.  On: 01/26/2020 14:48   US RENAL  Result Date: 01/28/2020 CLINICAL DATA:  Adrenal nodule seen on CT. EXAM: RENAL / URINARY TRACT ULTRASOUND COMPLETE COMPARISON:  Body CT January 26, 2020 FINDINGS: Right Kidney: Renal measurements: 9.0 x 4.4 x 5.0 cm = volume: 104 mL. Mild cortical thinning. Two benign-appearing cysts are seen within the right kidney measuring 1.2 and 1.9 cm in greatest dimension. Left Kidney: Renal measurements: 8.2 x 4.6 x 5.7 cm = volume: 113 mL. Mild cortical thinning. There is a simple cyst off of the  superior pole of the left kidney measuring 2.5 x 2.4 x 2.5 cm. A second simple cyst off of the mid polar region of the left kidney measures 2.1 x 1.9 x 2.2 cm. There is a cluster of complicated cysts off of the lower outer cortex of the left kidney measuring collectively 4.4 x 1.7 x 3.5 cm, indeterminate. Bladder: Appears normal for degree of bladder distention. Other: None. IMPRESSION: 1. Mild bilateral cortical thinning of the kidneys. 2. Bilateral benign-appearing renal cysts. 3. 4.4 cm cluster of complicated cysts versus complex cystic mass off the lower outer cortex of the left kidney. Evaluation with nonemergent MRI, renal protocol, may be considered, as suggested on the recent abdominal CT dated January 26, 2020. Electronically Signed   By: Fidela Salisbury M.D.   On: 01/28/2020 15:41   DG Hand 2 View Left  Result Date: 01/26/2020 CLINICAL DATA:  Fall EXAM: LEFT HAND - 2 VIEW COMPARISON:  None. FINDINGS: No acute displaced fracture or malalignment. Joint space narrowing and degenerative change at the fourth and fifth MCP joints. Chronic appearing deformity of the first metacarpal presumably due to prior trauma. IMPRESSION: No acute osseous abnormality. Chronic appearing deformity of the first metacarpal Electronically Signed   By: Donavan Foil M.D.   On: 01/26/2020 22:50   DG Humerus Left  Result Date: 01/26/2020 CLINICAL DATA:  Fall EXAM: LEFT HUMERUS - 2+ VIEW COMPARISON:  03/22/2019 FINDINGS: There is no evidence of fracture or other focal bone lesions. Soft tissues are unremarkable. IMPRESSION: Negative. Electronically Signed   By: Donavan Foil M.D.   On: 01/26/2020 22:48   ECHOCARDIOGRAM COMPLETE  Result Date: 01/27/2020    ECHOCARDIOGRAM REPORT   Patient Name:   [facility]  Date of Exam: 01/27/2020 Medical Rec #:  [id number]  Height:       65.0 in Accession #:    [id number] Weight:       131.6 lb Date of Birth:  [birth year]   BSA:          1.656 m Patient Age:    34 years   BP:            108/97 mmHg Patient Gender: M          HR:           70 bpm. Exam Location:  Inpatient Procedure: 2D Echo, Cardiac Doppler and Color Doppler Indications:   R94.31 Abnormal EKG  History:       Patient has no prior history of Echocardiogram examinations.                Cancer.  Sonographer:   Jonelle Sidle Dance Referring      [place]: IMPRESSIONS  1. Left ventricular ejection fraction, by estimation, is 60 to 65%. The left ventricle has normal function. The left ventricle has no regional wall motion abnormalities. Left ventricular diastolic parameters were normal.  2. Right ventricular systolic function is normal.  The right ventricular size is normal.  3. The mitral valve is normal in structure. No evidence of mitral valve regurgitation. No evidence of mitral stenosis.  4. The aortic valve is normal in structure. Aortic valve regurgitation is not visualized. No aortic stenosis is present.  5. The inferior vena cava is normal in size with greater than 50% respiratory variability, suggesting right atrial pressure of 3 mmHg. FINDINGS  Left Ventricle: Left ventricular ejection fraction, by estimation, is 60 to 65%. The left ventricle has normal function. The left ventricle has no regional wall motion abnormalities. The left ventricular internal cavity size was normal in size. There is  no left ventricular hypertrophy. Left ventricular diastolic parameters were normal. Normal left ventricular filling pressure. Right Ventricle: The right ventricular size is normal. No increase in right ventricular wall thickness. Right ventricular systolic function is normal. Left Atrium: Left atrial size was normal in size. Right Atrium: Right atrial size was normal in size. Pericardium: There is no evidence of pericardial effusion. Mitral Valve: The mitral valve is normal in structure. No evidence of mitral valve regurgitation. No evidence of mitral valve stenosis. Tricuspid Valve: The tricuspid valve is normal in  structure. Tricuspid valve regurgitation is not demonstrated. No evidence of tricuspid stenosis. Aortic Valve: The aortic valve is normal in structure. Aortic valve regurgitation is not visualized. No aortic stenosis is present. Pulmonic Valve: The pulmonic valve was normal in structure. Pulmonic valve regurgitation is not visualized. No evidence of pulmonic stenosis. Aorta: The aortic root is normal in size and structure. Venous: The inferior vena cava is normal in size with greater than 50% respiratory variability, suggesting right atrial pressure of 3 mmHg. IAS/Shunts: No atrial level shunt detected by color flow Doppler.  LEFT VENTRICLE PLAX 2D LVIDd:         2.75 cm LVIDs:         2.13 cm LV PW:         0.97 cm LV IVS:        0.94 cm LVOT diam:     2.20 cm LV SV:         78 LV SV Index:   47 LVOT Area:     3.80 cm  RIGHT VENTRICLE             IVC RV Basal diam:  2.20 cm     IVC diam: 1.47 cm RV S prime:     10.40 cm/s TAPSE (M-mode): 1.8 cm LEFT ATRIUM             Index       RIGHT ATRIUM           Index LA diam:        3.50 cm 2.11 cm/m  RA Area:     10.00 cm LA Vol (A2C):   26.5 ml 16.00 ml/m RA Volume:   17.90 ml  10.81 ml/m LA Vol (A4C):   48.9 ml 29.53 ml/m LA Biplane Vol: 35.9 ml 21.68 ml/m  AORTIC VALVE LVOT Vmax:   93.20 cm/s LVOT Vmean:  58.100 cm/s LVOT VTI:    0.206 m  AORTA Ao Root diam: 3.40 cm Ao Asc diam:  3.20 cm MITRAL VALVE MV Area (PHT): 3.27 cm     SHUNTS MV Decel Time: 232 msec     Systemic VTI:  0.21 m MV E velocity: 84.40 cm/s   Systemic Diam: 2.20 cm MV A velocity: 105.00 cm/s MV E/A ratio:  0.80 Mihai Croitoru MD Electronically signed by  Dani Gobble Croitoru MD Signature Date/Time: 01/27/2020/1:12:03 PM    Final    DG FEMUR 1V LEFT  Result Date: 01/26/2020 CLINICAL DATA:  Fall EXAM: LEFT FEMUR 1 VIEW COMPARISON:  05/07/2014 FINDINGS: There is no evidence of fracture or other focal bone lesions. Soft tissues are unremarkable. IMPRESSION: Negative. Electronically Signed   By: Donavan Foil M.D.   On: 01/26/2020 22:52   DG FEMUR 1V RIGHT  Result Date: 01/26/2020 CLINICAL DATA:  Fall EXAM: RIGHT FEMUR 1 VIEW COMPARISON:  05/07/2014 FINDINGS: There is no evidence of fracture or other focal bone lesions. Soft tissues are unremarkable. IMPRESSION: Negative. Electronically Signed   By: Donavan Foil M.D.   On: 01/26/2020 22:52   VAS [name] LOWER EXTREMITY VENOUS (DVT) (ONLY [facility] & [facility])  Result Date: 01/27/2020  Lower Venous DVT Study Indications: Swelling.  Limitations: Body habitus, poor ultrasound/tissue interface and contracted legs. Comparison Study: No prior study Performing Technologist: Maudry Mayhew MHA, RDMS, RVT, RDCS  Examination Guidelines: A complete evaluation includes B-mode imaging, spectral Doppler, color Doppler, and power Doppler as needed of all accessible portions of each vessel. Bilateral testing is considered an integral part of a complete examination. Limited examinations for reoccurring indications may be performed as noted. The reflux portion of the exam is performed with the patient in reverse Trendelenburg.  +---------+---------------+---------+-----------+----------+--------------+ RIGHT    CompressibilityPhasicitySpontaneityPropertiesThrombus Aging +---------+---------------+---------+-----------+----------+--------------+ CFV                     Yes      Yes        patent                   +---------+---------------+---------+-----------+----------+--------------+ SFJ                              Yes        patent                   +---------+---------------+---------+-----------+----------+--------------+ FV Prox                          Yes        patent                   +---------+---------------+---------+-----------+----------+--------------+ FV Mid                           Yes        patent                   +---------+---------------+---------+-----------+----------+--------------+ FV Distal                         Yes                                 +---------+---------------+---------+-----------+----------+--------------+ POP      Full           Yes      Yes        patent                   +---------+---------------+---------+-----------+----------+--------------+   Right Technical Findings: Not visualized segments include PFV, PTV, peroneal veins.  +---------+---------------+---------+-----------+----------+--------------+ LEFT     CompressibilityPhasicitySpontaneityPropertiesThrombus Aging +---------+---------------+---------+-----------+----------+--------------+ FV Mid   Full  Yes        patent                   +---------+---------------+---------+-----------+----------+--------------+ FV Distal                        Yes        patent                   +---------+---------------+---------+-----------+----------+--------------+ POP      Full           Yes      Yes        patent                   +---------+---------------+---------+-----------+----------+--------------+ PTV      Full                               patent                   +---------+---------------+---------+-----------+----------+--------------+ PERO     Full                               patent                   +---------+---------------+---------+-----------+----------+--------------+   Left Technical Findings: Not visualized segments include CFV, SFJ, FV proximal, PFV,.   Summary: RIGHT: - There is no evidence of deep vein thrombosis in the lower extremity. However, portions of this examination were limited- see technologist comments above.  - No cystic structure found in the popliteal fossa.  LEFT: - There is no evidence of deep vein thrombosis in the lower extremity. However, portions of this examination were limited- see technologist comments above.  - No cystic structure found in the popliteal fossa.  *See table(s) above for measurements and observations. Electronically  signed by Ruta Hinds MD on 01/27/2020 at 11:50:15 AM.    Final     Armando Reichert, MD 01/29/2020, 5:46 AM PGY-1, [place] Intern pager: [phone], text pages welcome

## 2020-01-29 NOTE — Anesthesia Preprocedure Evaluation (Addendum)
Anesthesia Evaluation  Patient identified by MRN, date of birth, ID band Patient awake    Reviewed: Allergy & Precautions, H&P , NPO status , Patient's Chart, lab work & pertinent test results  Airway Mallampati: II  TM Distance: >3 FB Neck ROM: Full    Dental no notable dental hx. (+) Edentulous Upper, Partial Lower, Poor Dentition, Dental Advisory Given   Pulmonary former smoker,    Pulmonary exam normal breath sounds clear to auscultation       Cardiovascular negative cardio ROS   Rhythm:Regular Rate:Normal     Neuro/Psych  Headaches, PSYCHIATRIC DISORDERS Anxiety Depression Dementia    GI/Hepatic negative GI ROS, Neg liver ROS,   Endo/Other  negative endocrine ROS  Renal/GU Renal Insufficiency and CRFRenal diseaseCr  2.04  negative genitourinary   Musculoskeletal  (+) Arthritis , Osteoarthritis,    Abdominal   Peds  Hematology  (+) Blood dyscrasia, anemia , H/H 8.3/24.4   Anesthesia Other Findings Abdominal pain with recent complaints of nausea, vomiting, anorexia, and constipation with bright red blood per rectum after passing a hard stool. Has chronic anemia with guaiac + stools in the setting of recent epistaxis.  Reproductive/Obstetrics negative OB ROS                           Anesthesia Physical Anesthesia Plan  ASA: II  Anesthesia Plan: MAC   Post-op Pain Management:    Induction: Intravenous  PONV Risk Score and Plan: 1 and Propofol infusion and Treatment may vary due to age or medical condition  Airway Management Planned: Nasal Cannula  Additional Equipment:   Intra-op Plan:   Post-operative Plan:   Informed Consent: I have reviewed the patients History and Physical, chart, labs and discussed the procedure including the risks, benefits and alternatives for the proposed anesthesia with the patient or authorized representative who has indicated his/her understanding  and acceptance.     Dental advisory given and Interpreter used for interveiw  Plan Discussed with: CRNA  Anesthesia Plan Comments:        Anesthesia Quick Evaluation

## 2020-01-29 NOTE — Transfer of Care (Signed)
Immediate Anesthesia Transfer of Care Note  Patient: [name]  Procedure(s) Performed: ESOPHAGOGASTRODUODENOSCOPY (EGD) WITH PROPOFOL (N/A ) COLONOSCOPY WITH PROPOFOL (N/A ) HOT HEMOSTASIS (ARGON PLASMA COAGULATION/BICAP) (N/A ) BIOPSY  Patient Location: PACU and Endoscopy Unit  Anesthesia Type:MAC  Level of Consciousness: drowsy  Airway & Oxygen Therapy: Patient Spontanous Breathing and Patient connected to nasal cannula oxygen  Post-op Assessment: Report given to RN and Post -op Vital signs reviewed and stable  Post vital signs: Reviewed and stable  Last Vitals:  Vitals Value Taken Time  BP 80/51 01/29/20 1222  Temp 36.6 C 01/29/20 1219  Pulse 73 01/29/20 1224  Resp 14 01/29/20 1224  SpO2 99 % 01/29/20 1224  Vitals shown include unvalidated device data.  Last Pain:  Vitals:   01/29/20 1219  TempSrc: Temporal  PainSc: Asleep         Complications: No complications documented.

## 2020-01-29 NOTE — Interval H&P Note (Signed)
History and Physical Interval Note:  01/29/2020 11:09 AM  [name]  has presented today for surgery, with the diagnosis of Anemia.  The various methods of treatment have been discussed with the patient and family. After consideration of risks, benefits and other options for treatment, the patient has consented to  Procedure(s): ESOPHAGOGASTRODUODENOSCOPY (EGD) WITH PROPOFOL (N/A) COLONOSCOPY WITH PROPOFOL (N/A) as a surgical intervention.  The patient's history has been reviewed, patient examined, no change in status, stable for surgery.  I have reviewed the patient's chart and labs.  Questions were answered to the patient's satisfaction.     Scarlette Shorts

## 2020-01-29 NOTE — Plan of Care (Signed)
Pt very anxious. Unable to educate pt. Interpruter used to educate patinet and unable to understand responses.

## 2020-01-29 NOTE — Progress Notes (Signed)
Occupational Therapy Treatment Patient Details Name: [name] MRN: [id number] DOB: 1945-05-24 Today's Date: 01/29/2020    History of present illness 74 Y/O M with Subdural Hematoma, Prostate Cancer, Colonic adenomatous polyps, presented with abdominal pain that started a few days ago. He denies N/V, his last BM was yesterday, and he endorsed seeing small blood in it. He also endorsed nasal bleed yesterday but none today.   OT comments  Pt making steady progress towards OT goals this session. Utilized [employer] features editor interpreter during session Hollie Beach [phone]) as pt prefers vietnamese. Pt alert and oriented this session per RN and following commands with increased time. Pt able to progress OOB to sink for UB ADLs needing MIN A for standing balance. Pt required up to MOD A for functional mobility to manage RW during mobility. Did note HR increase to 147 bpm however pt seated and laughing during tachy episode, ? accuracy of reading as pt asymptomatic during episode. Pt would continue to benefit from skilled occupational therapy while admitted and after d/c to address the below listed limitations in order to improve overall functional mobility and facilitate independence with BADL participation. DC plan remains appropriate, will follow acutely per POC.     Follow Up Recommendations  Home health OT    Equipment Recommendations  Tub/shower seat    Recommendations for Other Services      Precautions / Restrictions Precautions Precautions: Fall Restrictions Weight Bearing Restrictions: No       Mobility Bed Mobility Overal bed mobility: Needs Assistance Bed Mobility: Supine to Sit     Supine to sit: Min assist;HOB elevated     General bed mobility comments: pt required MIN A to elevate trunk into sitting and cues to sequence each step  Transfers Overall transfer level: Needs assistance Equipment used: Rolling walker (2 wheeled) Transfers: Sit to/from Stand Sit to Stand: Min  assist         General transfer comment: MIN A to power up from EOB, cues for hand placement and initial steadying assist upon stand    Balance Overall balance assessment: Needs assistance Sitting-balance support: No upper extremity supported Sitting balance-Leahy Scale: Good     Standing balance support: Single extremity supported;During functional activity Standing balance-Leahy Scale: Poor Standing balance comment: at least one UE supported during standing UB ADLs                           ADL either performed or assessed with clinical judgement   ADL Overall ADL's : Needs assistance/impaired     Grooming: Wash/dry hands;Standing;Minimal assistance Grooming Details (indicate cue type and reason): MIN A for standing balance while standing at sink to complete UB ADLs                 Toilet Transfer: Minimal assistance;Moderate assistance;RW;Ambulation Toilet Transfer Details (indicate cue type and reason): simulated via functional mobility, MIN A for safety and balance however pt required up to MOD A to manage RW         Functional mobility during ADLs: Minimal assistance;Moderate assistance;Cueing for safety;Rolling walker General ADL Comments: pt limited by generalized weakness, decreased activity tolerance and impaired balance     Vision Patient Visual Report: No change from baseline     Perception     Praxis      Cognition Arousal/Alertness: Awake/alert Behavior During Therapy: WFL for tasks assessed/performed Overall Cognitive Status: No family/caregiver present to determine baseline cognitive functioning  General Comments: pt oriented to location and was pleasant and appropriate during session        Exercises     Shoulder Instructions       General Comments pt on RA during session with O2 WNL, did note HR increase to 147 bpm however pt seated and laughing during tachy episode, ? accuracy  of reading, utilized RN to interpret at start of session however did utilize [place] interpreter at end of session Hollie Beach [phone]) as pt prefers [place].    Pertinent Vitals/ Pain       Pain Assessment: No/denies pain  Home Living                                          Prior Functioning/Environment              Frequency  Min 2X/week        Progress Toward Goals  OT Goals(current goals can now be found in the care plan section)  Progress towards OT goals: Progressing toward goals  Acute Rehab OT Goals Patient Stated Goal: Daughter wants him to have a wheelchair OT Goal Formulation: With patient/family Time For Goal Achievement: 02/10/20 Potential to Achieve Goals: [place] Discharge plan remains appropriate;Frequency remains appropriate    Co-evaluation                 AM-PAC OT "6 Clicks" Daily Activity     Outcome Measure   Help from another person eating meals?: None (NPO) Help from another person taking care of personal grooming?: A Little Help from another person toileting, which includes using toliet, bedpan, or urinal?: A Little Help from another person bathing (including washing, rinsing, drying)?: A Lot Help from another person to put on and taking off regular upper body clothing?: A Little Help from another person to put on and taking off regular lower body clothing?: A Lot 6 Click Score: 17    End of Session Equipment Utilized During Treatment: Gait belt;Rolling walker  OT Visit Diagnosis: Unsteadiness on feet (R26.81);Muscle weakness (generalized) (M62.81);History of falling (Z91.81)   Activity Tolerance Patient tolerated treatment well   Patient Left in bed;with call bell/phone within reach;with bed alarm set   Nurse Communication Mobility status        Time: 1638-4536 OT Time Calculation (min): 20 min  Charges: OT General Charges $OT Visit: 1 Visit OT Treatments $Self Care/Home Management : 8-22  mins  Lanier Clam., COTA/L Acute Rehabilitation Services [phone]    Ihor Gully 01/29/2020, 8:57 AM

## 2020-01-29 NOTE — Anesthesia Procedure Notes (Signed)
Procedure Name: MAC Date/Time: 01/29/2020 11:26 AM Performed by: Lowella Dell, CRNA Pre-anesthesia Checklist: Patient identified, Emergency Drugs available, Suction available, Patient being monitored and Timeout performed Patient Re-evaluated:Patient Re-evaluated prior to induction Oxygen Delivery Method: Nasal cannula Induction Type: IV induction Placement Confirmation: positive ETCO2 Dental Injury: Teeth and Oropharynx as per pre-operative assessment

## 2020-01-30 ENCOUNTER — Inpatient Hospital Stay (HOSPITAL_COMMUNITY): Payer: Medicare Other

## 2020-01-30 ENCOUNTER — Encounter (HOSPITAL_COMMUNITY): Payer: Self-pay | Admitting: Internal Medicine

## 2020-01-30 DIAGNOSIS — R195 Other fecal abnormalities: Secondary | ICD-10-CM | POA: Diagnosis not present

## 2020-01-30 DIAGNOSIS — K921 Melena: Secondary | ICD-10-CM | POA: Diagnosis not present

## 2020-01-30 DIAGNOSIS — K625 Hemorrhage of anus and rectum: Secondary | ICD-10-CM | POA: Diagnosis not present

## 2020-01-30 DIAGNOSIS — K5900 Constipation, unspecified: Secondary | ICD-10-CM | POA: Diagnosis not present

## 2020-01-30 DIAGNOSIS — K5521 Angiodysplasia of colon with hemorrhage: Secondary | ICD-10-CM | POA: Diagnosis not present

## 2020-01-30 DIAGNOSIS — M7989 Other specified soft tissue disorders: Secondary | ICD-10-CM | POA: Diagnosis not present

## 2020-01-30 LAB — CBC WITH DIFFERENTIAL/PLATELET
Abs Immature Granulocytes: 0.02 10*3/uL (ref 0.00–0.07)
Basophils Absolute: 0 10*3/uL (ref 0.0–0.1)
Basophils Relative: 1 %
Eosinophils Absolute: 0.2 10*3/uL (ref 0.0–0.5)
Eosinophils Relative: 3 %
HCT: 25.5 % — ABNORMAL LOW (ref 39.0–52.0)
Hemoglobin: 8.6 g/dL — ABNORMAL LOW (ref 13.0–17.0)
Immature Granulocytes: 0 %
Lymphocytes Relative: 22 %
Lymphs Abs: 1.2 10*3/uL (ref 0.7–4.0)
MCH: 34.1 pg — ABNORMAL HIGH (ref 26.0–34.0)
MCHC: 33.7 g/dL (ref 30.0–36.0)
MCV: 101.2 fL — ABNORMAL HIGH (ref 80.0–100.0)
Monocytes Absolute: 0.5 10*3/uL (ref 0.1–1.0)
Monocytes Relative: 8 %
Neutro Abs: 3.6 10*3/uL (ref 1.7–7.7)
Neutrophils Relative %: 66 %
Platelets: 250 10*3/uL (ref 150–400)
RBC: 2.52 MIL/uL — ABNORMAL LOW (ref 4.22–5.81)
RDW: 12.1 % (ref 11.5–15.5)
WBC: 5.6 10*3/uL (ref 4.0–10.5)
nRBC: 0 % (ref 0.0–0.2)

## 2020-01-30 LAB — BASIC METABOLIC PANEL
Anion gap: 6 (ref 5–15)
BUN: 13 mg/dL (ref 8–23)
CO2: 23 mmol/L (ref 22–32)
Calcium: 8.6 mg/dL — ABNORMAL LOW (ref 8.9–10.3)
Chloride: 109 mmol/L (ref 98–111)
Creatinine, Ser: 2.06 mg/dL — ABNORMAL HIGH (ref 0.61–1.24)
GFR, Estimated: 34 mL/min — ABNORMAL LOW (ref >60)
Glucose, Bld: 116 mg/dL — ABNORMAL HIGH (ref 70–99)
Potassium: 4.7 mmol/L (ref 3.5–5.1)
Sodium: 138 mmol/L (ref 135–145)

## 2020-01-30 LAB — SURGICAL PATHOLOGY

## 2020-01-30 MED ORDER — PANTOPRAZOLE SODIUM 40 MG PO TBEC
40.0000 mg | DELAYED_RELEASE_TABLET | Freq: Every day | ORAL | Status: DC
  Administered 2020-01-31 – 2020-02-02 (×3): 40 mg via ORAL
  Filled 2020-01-30 (×3): qty 1

## 2020-01-30 MED ORDER — FERROUS SULFATE 325 (65 FE) MG PO TABS
325.0000 mg | ORAL_TABLET | ORAL | Status: DC
  Administered 2020-01-30 – 2020-02-01 (×2): 325 mg via ORAL
  Filled 2020-01-30 (×3): qty 1

## 2020-01-30 MED ORDER — ACETAMINOPHEN 325 MG PO TABS
650.0000 mg | ORAL_TABLET | Freq: Four times a day (QID) | ORAL | Status: DC | PRN
  Administered 2020-01-31: 650 mg via ORAL
  Filled 2020-01-30: qty 2

## 2020-01-30 NOTE — Progress Notes (Addendum)
Modified Barium Swallow Progress Note  Patient Details  Name: [name] MRN: [id number] Date of Birth: 09-Jun-1945  Today's Date: 01/30/2020  Modified Barium Swallow completed.  Full report located under Chart Review in the Imaging Section.  Brief recommendations include the following:  Clinical Impression   Pt has a moderate oral dysphagia also impacted by quick rate of consumption. Boluses reach his pharynx either by means of passive transit or short bursts of lingual pumping. There does not appear to be any attempt at bolus cohesion. Oral residue is diffuse, with liquids spilling even under the tongue. Masticate with a small bite of cracker is incomplete, with pt able to break it into smaller pieces but still swallowing them in unmasticated pieces. Boluses will spill into the valleculae and down into the pyriform sinuses, sitting throughout his pharynx until he triggers after a variable delay. He is often still trying to get additional, consecutive boluses in his mouth even before he has swallowed what is in his pharynx. When his rate becomes more rapid, and larger volumes fill his pharynx, he does start to aspirate thin liquids. The volume is trace, but only once did it elicit a cough. No aspirated material was cleared despite one instance of coughing. Recommend adjusting diet to Dys 2 (chopped) solids and nectar thick liquids. Suspect that he could potentially resume thin liquids in the future if his rate and/or oral control can be better regulated.   Phone interpreter, Jenny Reichmann [phone], was utilized throughout this study.   Swallow Evaluation Recommendations       SLP Diet Recommendations: Dysphagia 2 (Fine chop) solids;Nectar thick liquid   Liquid Administration via: Cup;Straw   Medication Administration: Whole meds with puree   Supervision: Staff to assist with self feeding;Full supervision/cueing for compensatory strategies   Compensations: Minimize environmental distractions;Slow  rate   Postural Changes: Seated upright at 90 degrees;Remain semi-upright after after feeds/meals (Comment)   Oral Care Recommendations: Oral care BID        Osie Bond., M.A. [place] Pager [phone] Office [phone]  01/30/2020,3:59 PM

## 2020-01-30 NOTE — Plan of Care (Signed)
  Problem: Coping: Goal: Level of anxiety will decrease Outcome: Progressing   Problem: Elimination: Goal: Will not experience complications related to bowel motility Outcome: Progressing Goal: Will not experience complications related to urinary retention Outcome: Progressing   Problem: Pain Managment: Goal: General experience of comfort will improve Outcome: Progressing   

## 2020-01-30 NOTE — Progress Notes (Signed)
  Speech Language Pathology Treatment: Dysphagia  Patient Details Name: [name] MRN: [id number] DOB: 01-Jan-1946 Today's Date: 01/30/2020 Time: 4680-3212 SLP Time Calculation (min) (ACUTE ONLY): 34 min  Assessment / Plan / Recommendation Clinical Impression  Pt seen for ongoing dysphagia management. Pt seen with video interpreter [phone], Maudie Mercury).  Video interpreter had great difficulty comprehending pt's speech, but pt seemed to understand interpreter and followed directions and responded to verbal cues give by interpreter.  Pt had been placed on Dys 2 diet by SLP on 6/13.  This morning pt's diet was regular texture.  Today pt trialed thin liquid by cup and straw, puree, soft solid, and regular solid. There was prolonged oral phase with regular solid, but pt achieved adequate oral clearance.  There was intermittent grimacing with thin liquid; pt did not indicate odynophagia, but did say "no more."  Pt noted to orally hold thin liquid intermittently and benefitted from verbal cue to swallow. Following several PO trials pt exhibited strong cough when liquid wash was used following regular solid texture.  Pt was also noted to belch with anterior loss of liquid afterwards, but denied regurgitation with eructation.  Given delayed cough response and noted eructation, suspect there is an esophageal component to dysphagia.  EGD on 11/15 was normal.  Recommend mechanical soft diet with thin liquids, with MBSS to follow to assess pharyngeal swallow function.      Of note: Interpreter was able to hear a few words from pt as he pointed to his R arm  "Blood" "unberable" and "three."  SLP removed blood pressure cuff which was placed over recent lab stick.  There was some redness.  RN called to assess.    Also, pt's daughter called during this session.  She spoke with her father, but also had questions about discharge and requested call back from provider.  Sent text page via secure chat to resident with daughter  Thao's contact info.    HPI HPI: 74 Y/O M with Subdural Hematoma, Prostate Cancer, Colonic adenomatous polyps, presented with abdominal pain that started a few days ago. Underwent EGD/Colonoscopy 11/15 with normal results on EGD.      SLP Plan  MBS       Recommendations  Diet recommendations: Dysphagia 3 (mechanical soft);Thin liquid Liquids provided via: Cup;Straw Medication Administration: Whole meds with puree Supervision: Full supervision/cueing for compensatory strategies Compensations: Minimize environmental distractions;Slow rate;Small sips/bites Postural Changes and/or Swallow Maneuvers: Upright 30-60 min after meal;Seated upright 90 degrees                Oral Care Recommendations: Oral care BID Follow up Recommendations:  (Continue ST at next level of care) Plan: [place], [place], [place] Office: [phone]  01/30/2020, 10:23 AM

## 2020-01-30 NOTE — Progress Notes (Signed)
Physical Therapy Treatment Patient Details Name: [name] MRN: [id number] DOB: 02/13/1946 Today's Date: 01/30/2020    History of Present Illness 74 Y/O M with Subdural Hematoma, Prostate Cancer, Colonic adenomatous polyps, presented with abdominal pain that started a few days ago. He denies N/V, his last BM was yesterday, and he endorsed seeing small blood in it. He also endorsed nasal bleed yesterday but none today.    PT Comments    [place] interpreter with difficulty comprehending pt speech; pt seemingly understanding ~50% of instructions via interpreter. Ambulating 40 feet with a walker at a min assist level. Demonstrates impulsivity and balance deficits. Presents as a high fall risk based on decreased gait speed and decreased safety awareness. Continue to recommend SNF for ongoing Physical Therapy.     [employer] Interpreter Mardene Celeste [phone] assisted this session.    Follow Up Recommendations  SNF;Supervision/Assistance - 24 hour     Equipment Recommendations  3in1 (PT);Wheelchair (measurements PT);Wheelchair cushion (measurements PT)    Recommendations for Other Services       Precautions / Restrictions Precautions Precautions: Fall Restrictions Weight Bearing Restrictions: No    Mobility  Bed Mobility Overal bed mobility: Needs Assistance Bed Mobility: Supine to Sit;Sit to Supine     Supine to sit: Min assist Sit to supine: Min assist   General bed mobility comments: MinA to initiate and elevate trunk into sitting. Assist for LE's back into bed  Transfers Overall transfer level: Needs assistance Equipment used: Rolling walker (2 wheeled) Transfers: Sit to/from Stand Sit to Stand: Min assist         General transfer comment: MinA to rise to stand from edge of bed  Ambulation/Gait Ambulation/Gait assistance: Min assist Gait Distance (Feet): 40 Feet Assistive device: Rolling walker (2 wheeled);Straight cane Gait Pattern/deviations: Step-through  pattern;Decreased stride length;Trunk flexed Gait velocity: decreased   General Gait Details: Pt with poor proximity to walker, requiring minA for walker negotiation and balance   Stairs             Wheelchair Mobility    Modified Rankin (Stroke Patients Only)       Balance Overall balance assessment: Needs assistance Sitting-balance support: No upper extremity supported Sitting balance-Leahy Scale: Good     Standing balance support: Bilateral upper extremity supported;During functional activity Standing balance-Leahy Scale: Poor                              Cognition Arousal/Alertness: Awake/alert Behavior During Therapy: Impulsive Overall Cognitive Status: No family/caregiver present to determine baseline cognitive functioning                                 General Comments: Pt impulsive, demonstrates STM deficits, stating, "I'm hungry," twice despite being told he was about to leave for a barium swallow study to determine his diet.       Exercises      General Comments        Pertinent Vitals/Pain Pain Assessment: Faces Faces Pain Scale: No hurt    Home Living                      Prior Function            PT Goals (current goals can now be found in the care plan section) Acute Rehab PT Goals Patient Stated Goal: did not state Potential to Achieve Goals:  Fair Progress towards PT goals: Progressing toward goals    Frequency    Min 2X/week      PT Plan Current plan remains appropriate    Co-evaluation              AM-PAC PT "6 Clicks" Mobility   Outcome Measure  Help needed turning from your back to your side while in a flat bed without using bedrails?: None Help needed moving from lying on your back to sitting on the side of a flat bed without using bedrails?: A Little Help needed moving to and from a bed to a chair (including a wheelchair)?: A Little Help needed standing up from a chair using  your arms (e.g., wheelchair or bedside chair)?: A Little Help needed to walk in hospital room?: A Little Help needed climbing 3-5 steps with a railing? : A Lot 6 Click Score: 18    End of Session Equipment Utilized During Treatment: Gait belt Activity Tolerance: Patient tolerated treatment well Patient left: in bed;with call bell/phone within reach;with bed alarm set Nurse Communication: Mobility status PT Visit Diagnosis: Unsteadiness on feet (R26.81);Other abnormalities of gait and mobility (R26.89);History of falling (Z91.81);Repeated falls (R29.6)     Time: 9507-2257 PT Time Calculation (min) (ACUTE ONLY): 23 min  Charges:  $Therapeutic Activity: 23-37 mins                     Wyona Almas, PT, DPT Acute Rehabilitation Services Pager [phone] Office [phone]    Deno Etienne 01/30/2020, 1:38 PM

## 2020-01-30 NOTE — Progress Notes (Addendum)
Family Medicine Teaching Service Daily Progress Note Intern Pager: [phone]  Patient name: [name] Medical record number: [id number] Date of birth: 1945-05-25 Age: 74 y.o. Gender: male  Primary Care Provider: Matilde Haymaker, MD Consultants: GI Code Status: Full   Pt Overview and Major Events to Date:  Admitted 11/12 for abdominal pain   Assessment and Plan: [name] is a 74 y.o. male presenting with lower extremity edema and abdominal pain. PMH is significant for dementia, frequent falls, chronic subdural hematoma, CKD stage 3, and oropharyngeal dysphagia.   Of note, history is extremely limited as patient is vietnamese speaking only and interpreters are unable to understand patient, as he is edentulous.    Abdominal pain with constipation  This morning patient Pt denies any abdominal pain, diarrhea, nausea and vomiting. Endoscopy and Colonoscopy was done yesterday.  Endoscopy shows moderate sliding hiatal hernia. Colonoscopy shows A 3 mm polyp was removed from sigmoid colon. Multiple diverticula were found in the sigmoid colon and right colon. Several angioectasias with moderate but definite bleeding were found in the rectum. Coagulation for hemostasis using argon beam was successful. His vitals are stable today. Hb is improving 8.6 (8.3 yesterday and 9.7 at admission). MCV 102.2. -GI consulted. Appreciate recommendations.  -OOB with assistance  -Vitals per routine --normal diet small bites, sips with straw.  -IV Protonix 40 mg twice daily   Lower extremity edema B/L Edema present improving.  I-O is -[id number]. His creatinine today is 1.8 improved from 2.04.   Echocardiogram was  essentially normal -Continue to monitor lower extremity edema -Consider additional diuresis if Edema increases. -Monitor I/O   History of recurrent falls with recent fall PT, OT eval done recommended SNF placement. -Out of bed with assistance --Disposition to SNF. Called Daughter and left message.  Home  situation with history of dementia and language barrier Patient requires 24-hour care but does not have a clear that that is occurring at home.  -Anticipate discharge to SNF -Transitions of care consult at D/S   Anemia, macrocytic Hemoglobin 9.5 on admission with MCV 102.2.Today Hb is improved to 8.6 (8.3 yest) with MCV of 101.2.  Vitamin B12 and Folate levels normal. -Monitor CBC  -Continue to monitor for bloody stools or other source of bleeding -Continue PPI twice daily  -Start Ferrous sulphate 325 every other day.  CKD III  GFR 40's at baseline. Today creatinine is 2.06 worsened from 1.8 yesterday. Creatinine 1.5 at baseline. No further diuresis at this time. - Monitor BMP daily - Avoid other neprhotoxic meds  -MRI of kidney inpatient or Outpatient when creatinine when renal functions are normal. -We'll decrease fluids if he tolerates orally.    FEN/GI: Pt is on regular diet. Small bites and sips with straw. PPx: Lovenox Disposition: Med tele now SNF at discharge. Subjective:  Pt is doing good. Denies any nausea, vomiting, diarrhea. Pt denies any abdominal pain. Denies any questions or concerns.   Objective: Temp:  [97.6 F (36.4 C)-98.8 F (37.1 C)] 98 F (36.7 C) (11/16 0332) Pulse Rate:  [59-127] 77 (11/16 0332) Resp:  [10-16] 15 (11/15 1252) BP: (71-126)/(33-85) 120/76 (11/15 1252) SpO2:  [97 %-100 %] 98 % (11/15 1252) Weight:  [57.9 kg-58.1 kg] 58.1 kg (11/16 0332) Physical Exam: General:Not in acute stress. Laying comfortably. Cardiovascular:S1 S2 normal, No murmurs Respiratory:Normal breathing sounds Abdomen:Non tender, BS +nt Extremities:B/L edema present. Laboratory: Recent Labs  Lab 01/27/20 0443 01/28/20 0124 01/29/20 0425  WBC 5.0 4.6 4.7  HGB 9.7* 8.9* 8.3*  HCT 28.5*  26.0* 24.4*  PLT 294 282 273   Recent Labs  Lab 01/26/20 1017 01/26/20 1017 01/27/20 0443 01/28/20 0124 01/29/20 1359  NA 137   < > 137 139 140  K 4.7   < > 4.7 5.0 4.1   CL 108   < > 106 106 107  CO2 21*   < > 23 24 23   BUN 35*   < > 35* 30* 13  CREATININE 1.73*   < > 2.08* 2.04* 1.80*  CALCIUM 8.8*   < > 9.0 8.7* 8.6*  PROT 6.5  --  6.6  --   --   BILITOT 0.8  --  1.3*  --   --   ALKPHOS 78  --  83  --   --   ALT 39  --  38  --   --   AST 50*  --  47*  --   --   GLUCOSE 92   < > 85 88 115*   < > = values in this interval not displayed.     Imaging/Diagnostic Tests: 01/29/20 Endoscopy shows moderate sliding hiatal hernia. Colonoscopy shows A 3 mm polyp was removed from sigmoid colon. Multiple diverticula were found in the sigmoid colon and right colon. Several angioectasias with moderate but definite bleeding were found in the rectum. Coagulation for hemostasis using argon beam was successful.  Armando Reichert, MD 01/30/2020, 6:14 AM PGY-q, [place] Intern pager: [phone], text pages welcome

## 2020-01-31 DIAGNOSIS — M7989 Other specified soft tissue disorders: Secondary | ICD-10-CM | POA: Diagnosis not present

## 2020-01-31 DIAGNOSIS — K5521 Angiodysplasia of colon with hemorrhage: Secondary | ICD-10-CM | POA: Diagnosis not present

## 2020-01-31 DIAGNOSIS — K921 Melena: Secondary | ICD-10-CM | POA: Diagnosis not present

## 2020-01-31 LAB — CBC WITH DIFFERENTIAL/PLATELET
Abs Immature Granulocytes: 0.02 10*3/uL (ref 0.00–0.07)
Basophils Absolute: 0.1 10*3/uL (ref 0.0–0.1)
Basophils Relative: 1 %
Eosinophils Absolute: 0.3 10*3/uL (ref 0.0–0.5)
Eosinophils Relative: 7 %
HCT: 25 % — ABNORMAL LOW (ref 39.0–52.0)
Hemoglobin: 8.5 g/dL — ABNORMAL LOW (ref 13.0–17.0)
Immature Granulocytes: 1 %
Lymphocytes Relative: 32 %
Lymphs Abs: 1.3 10*3/uL (ref 0.7–4.0)
MCH: 34.4 pg — ABNORMAL HIGH (ref 26.0–34.0)
MCHC: 34 g/dL (ref 30.0–36.0)
MCV: 101.2 fL — ABNORMAL HIGH (ref 80.0–100.0)
Monocytes Absolute: 0.4 10*3/uL (ref 0.1–1.0)
Monocytes Relative: 10 %
Neutro Abs: 2.1 10*3/uL (ref 1.7–7.7)
Neutrophils Relative %: 49 %
Platelets: 270 10*3/uL (ref 150–400)
RBC: 2.47 MIL/uL — ABNORMAL LOW (ref 4.22–5.81)
RDW: 12.1 % (ref 11.5–15.5)
WBC: 4.2 10*3/uL (ref 4.0–10.5)
nRBC: 0 % (ref 0.0–0.2)

## 2020-01-31 LAB — COMPREHENSIVE METABOLIC PANEL
ALT: 30 U/L (ref 0–44)
AST: 37 U/L (ref 15–41)
Albumin: 2.7 g/dL — ABNORMAL LOW (ref 3.5–5.0)
Alkaline Phosphatase: 61 U/L (ref 38–126)
Anion gap: 6 (ref 5–15)
BUN: 15 mg/dL (ref 8–23)
CO2: 23 mmol/L (ref 22–32)
Calcium: 8.6 mg/dL — ABNORMAL LOW (ref 8.9–10.3)
Chloride: 110 mmol/L (ref 98–111)
Creatinine, Ser: 1.97 mg/dL — ABNORMAL HIGH (ref 0.61–1.24)
GFR, Estimated: 35 mL/min — ABNORMAL LOW (ref >60)
Glucose, Bld: 96 mg/dL (ref 70–99)
Potassium: 4.5 mmol/L (ref 3.5–5.1)
Sodium: 139 mmol/L (ref 135–145)
Total Bilirubin: 0.3 mg/dL (ref 0.3–1.2)
Total Protein: 5.5 g/dL — ABNORMAL LOW (ref 6.5–8.1)

## 2020-01-31 LAB — H. PYLORI ANTIGEN, STOOL: H. Pylori Stool Ag, Eia: NEGATIVE

## 2020-01-31 MED ORDER — GUAIFENESIN-DM 100-10 MG/5ML PO SYRP
5.0000 mL | ORAL_SOLUTION | ORAL | Status: DC | PRN
  Administered 2020-01-31 – 2020-02-01 (×3): 5 mL via ORAL
  Filled 2020-01-31 (×3): qty 5

## 2020-01-31 MED ORDER — RESOURCE THICKENUP CLEAR PO POWD
ORAL | Status: DC | PRN
  Filled 2020-01-31: qty 125

## 2020-01-31 NOTE — Progress Notes (Signed)
  Speech Language Pathology Treatment: Dysphagia  Patient Details Name: [name] MRN: [id number] DOB: 1946/02/09 Today's Date: 01/31/2020 Time: 5701-7793 SLP Time Calculation (min) (ACUTE ONLY): 22 min  Assessment / Plan / Recommendation Clinical Impression  Pt was seen for dysphagia treatment. [employer] [place] interpreter Liam Rogers (ID# [phone]) was used for translation and interpretation; however, the interpreter reported that the pt's speech is "slurred" and that he was having difficulty understanding him. Pt tolerated dysphagia 2 solids and thin liquids via straw without overt s/sx of aspiration, but exhibited coughing with 50% of thin liquid boluses via cup. Mastication time was sometimes prolonged and other times concerningly brief with request for additional boluses soon thereafter. Pt continues to demonstrate impulsive tendencies and frequently requested additional solid boluses prior to adequate mastication of the initial bolus. No residue was observed with solids. It is recommended that his current diet be continued and SLP will continue to follow pt.    HPI HPI: 74 Y/O M with Subdural Hematoma, Prostate Cancer, Colonic adenomatous polyps, presented with abdominal pain that started a few days ago. Underwent EGD/Colonoscopy 11/15 with normal results on EGD.      SLP Plan  Continue with current plan of care       Recommendations  Diet recommendations: Dysphagia 2 (fine chop);Nectar-thick liquid Liquids provided via: Cup;Straw Medication Administration: Whole meds with puree Supervision: Full supervision/cueing for compensatory strategies Compensations: Minimize environmental distractions;Slow rate Postural Changes and/or Swallow Maneuvers: Upright 30-60 min after meal;Seated upright 90 degrees                Oral Care Recommendations: Oral care BID Follow up Recommendations: Skilled Nursing facility SLP Visit Diagnosis: Dysphagia, oropharyngeal phase  (R13.12) Plan: Continue with current plan of care       Willo Yoon I. Hardin Negus, [place], [place] Office number [phone] Pager Major 01/31/2020, 5:59 PM

## 2020-01-31 NOTE — Progress Notes (Signed)
Family Medicine Teaching Service Daily Progress Note Intern Pager: [phone]  Patient name: [name] Medical record number: [id number] Date of birth: Jan 31, 1946 Age: 74 y.o. Gender: male  Primary Care Provider: Matilde Haymaker, MD Consultants:GI Code Status:Full  Pt Overview and Major Events to Date: Admitted 11/12 for abdominal pain  Assessment and Plan: Ross Phanis a 74 y.o.malepresenting with lower extremity edema and abdominal pain. PMH is significant fordementia, frequent falls, chronic subdural hematoma, CKD stage 3, and oropharyngeal dysphagia.  Of note, history is extremely limited as patient is vietnamese speaking only and interpreters are unable to understand patient, as he is edentulous.  Abdominal pain with constipation This morning patientPt is sitting comfortably. Denies any complaints. Wants to go home.  Endoscopy and Colonoscopy was done.  Endoscopy shows moderate sliding hiatal hernia. Colonoscopy shows A 3 mm polyp was removed from sigmoid colon. Multiple diverticula were found in the sigmoid colon and right colon. Several angioectasias with moderate but definite bleeding were found in the rectum. Coagulation for hemostasis using argon beam was successful. His vitals are stable today. Hb is improving 8.5 (8.6 yesterday and 9.7 at admission). MCV 101.2. Swallow study recommended adjusting diet to Dys 2 (chopped) solids and nectar thick liquids.  -GI consulted. Appreciate recommendations. -OOB with assistance  -Vitals per routine --Chopped solids small bites, sips with straw and Meds with Puree. -IV Protonix 40 mg twice daily.  Lower extremity edema B/L Edema present improving. Net Intake of -62.4. His creatininetoday is 1.97 improved from 2.06.  Echocardiogramwas essentially normal -Continue to monitor lower extremity edema -Consideradditional diuresis if Edema increases. -Monitor I/O -Stop IV fluids.  History of recurrent falls with recent  fall PT, OT eval donerecommended SNF placement. -Out of bed with assistance --Disposition to SNF. Called Daughter and left message.  Home situation with history of dementia and language barrier Patient requires 24-hour care but does not have a clear that that is occurring at home.  -Anticipate discharge to SNF -Transitions of care consultat D/S  Anemia, macrocytic  Hb is controlled to 8.5(8.6 yest) with MCV of101.2. Vitamin B12 and Folate levels normal. -Monitor CBC -Continue to monitor for bloody stools or other source of bleeding -Continue PPI twice daily  -Continue Ferrous sulphate 325 every other day.  CKD III  GFR 40's at baseline. Today creatinine is 1.97 improved from yesterday 2.06. Creatinine 1.5 at baseline. No further diuresis at this time. -Monitor BMP daily - Avoid other neprhotoxic meds -MRI of kidney inpatient or Outpatient when creatinine when renal functions are normal. -Stop IV fluids  FEN/GI:Pt is on regular diet. Small bites and sips with straw. VHQ:IONGEXB Disposition: Med tele now SNF at discharge.  Subjective:  This morning patientPt is sitting comfortably. Denies any complaints. Wants to go home.   Objective: Temp:  [97.8 F (36.6 C)-98.7 F (37.1 C)] 97.8 F (36.6 C) (11/17 0500) Pulse Rate:  [66-92] 66 (11/17 0500) Resp:  [16-18] 18 (11/16 2350) BP: (90-130)/(65-98) 96/65 (11/17 0500) SpO2:  [98 %] 98 % (11/17 0500) Weight:  [58.2 kg] 58.2 kg (11/17 0500) Physical Exam: General:Not in acute stress.Laying comfortably. Cardiovascular:S1 S2 normal, No murmurs Respiratory:Normal breathing sounds Abdomen:Non tender, BS +nt Extremities:B/L edema present less than at admission. Laboratory: Recent Labs  Lab 01/29/20 0425 01/30/20 0613 01/31/20 0348  WBC 4.7 5.6 4.2  HGB 8.3* 8.6* 8.5*  HCT 24.4* 25.5* 25.0*  PLT 273 250 270   Recent Labs  Lab 01/26/20 1017 01/26/20 1017 01/27/20 0443 01/28/20 0124 01/29/20 1359  01/30/20 2841  01/31/20 0348  NA 137   < > 137   < > 140 138 139  K 4.7   < > 4.7   < > 4.1 4.7 4.5  CL 108   < > 106   < > 107 109 110  CO2 21*   < > 23   < > 23 23 23   BUN 35*   < > 35*   < > 13 13 15   CREATININE 1.73*   < > 2.08*   < > 1.80* 2.06* 1.97*  CALCIUM 8.8*   < > 9.0   < > 8.6* 8.6* 8.6*  PROT 6.5  --  6.6  --   --   --  5.5*  BILITOT 0.8  --  1.3*  --   --   --  0.3  ALKPHOS 78  --  83  --   --   --  61  ALT 39  --  38  --   --   --  30  AST 50*  --  47*  --   --   --  37  GLUCOSE 92   < > 85   < > 115* 116* 96   < > = values in this interval not displayed.      Imaging/Diagnostic Tests: DG Chest 1 View  Result Date: 01/26/2020 CLINICAL DATA:  Fall EXAM: CHEST  1 VIEW COMPARISON:  05/25/2018, 04/30/2018 FINDINGS: No focal opacity or pleural effusion. Stable cardiomediastinal silhouette with right hilar fullness presumably due to vascular summation shadow. Aortic atherosclerosis. No pneumothorax. Chronic right fifth rib fracture IMPRESSION: No active disease. Electronically Signed   By: Donavan Foil M.D.   On: 01/26/2020 22:55   DG Pelvis 1-2 Views  Result Date: 01/26/2020 CLINICAL DATA:  Fall EXAM: PELVIS - 1-2 VIEW COMPARISON:  11/01/2017 FINDINGS: Faint contrast within the urinary bladder. Clips in the region of the prostate. Pubic symphysis and rami appear intact. Both femoral heads project in joint. No fracture or malalignment IMPRESSION: No acute osseous abnormality Electronically Signed   By: Donavan Foil M.D.   On: 01/26/2020 22:46   DG Forearm Left  Result Date: 01/26/2020 CLINICAL DATA:  Fall EXAM: LEFT FOREARM - 2 VIEW COMPARISON:  None. FINDINGS: No acute displaced fracture or malalignment. No significant elbow effusion. Chronic appearing deformity of the first metacarpal IMPRESSION: No acute osseous abnormality. Electronically Signed   By: Donavan Foil M.D.   On: 01/26/2020 22:51   DG Tibia/Fibula Left  Result Date: 01/26/2020 CLINICAL DATA:   Encounter for fall EXAM: LEFT TIBIA AND FIBULA - 2 VIEW COMPARISON:  05/07/2014 FINDINGS: No fracture or malalignment.  Diffuse soft tissue edema. IMPRESSION: No acute osseous abnormality. Electronically Signed   By: Donavan Foil M.D.   On: 01/26/2020 22:47   DG Tibia/Fibula Right  Result Date: 01/26/2020 CLINICAL DATA:  Fall, right foreleg pain EXAM: RIGHT TIBIA AND FIBULA - 2 VIEW COMPARISON:  None. FINDINGS: Two view radiograph right foreleg demonstrates normal alignment. No fracture or dislocation. No destructive osseous lesion. There is diffuse subcutaneous edema involving the right foreleg. IMPRESSION: Diffuse subcutaneous edema.  No fracture or dislocation. Electronically Signed   By: Fidela Salisbury MD   On: 01/26/2020 22:46   CT Head Wo Contrast  Result Date: 01/26/2020 CLINICAL DATA:  74 year old male with altered mental status. EXAM: CT HEAD WITHOUT CONTRAST TECHNIQUE: Contiguous axial images were obtained from the base of the skull through the vertex without intravenous contrast. COMPARISON:  Head  CT dated 03/22/2019. FINDINGS: Brain: There is mild age-related atrophy and chronic microvascular ischemic changes. Small chronic left frontal subdural collection/hematoma measuring 3 mm in thickness was seen on the prior CT. No new hemorrhage. No mass effect or midline shift. Vascular: No hyperdense vessel or unexpected calcification. Skull: Normal. Negative for fracture or focal lesion. Sinuses/Orbits: No acute finding. Other: None IMPRESSION: 1. Stable small chronic left frontal subdural hemorrhage. No new bleed. 2. Mild age-related atrophy and chronic microvascular ischemic changes. Electronically Signed   By: Anner Crete M.D.   On: 01/26/2020 21:29   CT ABDOMEN PELVIS W CONTRAST  Result Date: 01/26/2020 CLINICAL DATA:  Lower extremity edema and abdominal pain EXAM: CT ABDOMEN AND PELVIS WITH CONTRAST TECHNIQUE: Multidetector CT imaging of the abdomen and pelvis was performed using the  standard protocol following bolus administration of intravenous contrast. CONTRAST:  169mL OMNIPAQUE IOHEXOL 300 MG/ML  SOLN COMPARISON:  CT abdomen and pelvis January 16, 2020 FINDINGS: Lower chest: There is bibasilar atelectasis. No lung base edema or consolidation. There is a sizable hiatal hernia, stable. Hepatobiliary: There is a 7 mm cyst immediately to the left of the left portal vein within the liver. No other focal liver lesions are evident. Gallbladder appears upper normal in size without appreciable gallbladder wall thickening. There is no biliary duct dilatation. Pancreas: There is no pancreatic mass or inflammatory focus. Spleen: No splenic lesions are evident. Adrenals/Urinary Tract: Adrenals bilaterally appear normal. Renal cysts are noted bilaterally, stable. The previously noted focus of enhancement immediately adjacent to a cyst along the lateral left kidney measuring 8 mm is best seen on coronal slice 42 series 6 and is unchanged from recent prior study. No hydronephrosis is appreciable on either side. There is a 4 x 2 mm calculus in the lower pole of the left kidney. There is a 2 mm calculus in the upper pole of the left kidney. There is no evident ureteral calculus on either side. Urinary bladder is midline with wall thickness within normal limits. Stomach/Bowel: There is moderate stool in the colon. There is no appreciable bowel wall or mesenteric thickening. Scattered colonic diverticula, most notably in the cecal region without diverticular inflammation. No appreciable bowel obstruction. Terminal ileum appears normal. There is no evident free air or portal venous air. Vascular/Lymphatic: No abdominal aortic aneurysm. There is aortic and iliac artery atherosclerosis. Major venous structures appear patent. No evident adenopathy in the abdomen or pelvis. Reproductive: There are surgical clips posterior to the prostate, stable. Prostate and seminal vesicles appear normal in size and contour.  Other: Appendix appears normal no evident abscess or ascites in the abdomen or pelvis. Musculoskeletal: There is stable relative thickening of the left lateral internal oblique muscle compared to its counterpart on the right. The attenuation in this muscle appears normal without obvious hematoma or mass. No other muscle asymmetry is evident. There is degenerative change in the lumbar spine. No blastic or lytic bone lesions are evident. Note that there is lower lumbar levoscoliosis. IMPRESSION: 1. 8 mm enhancing area lateral left kidney persists immediately adjacent to a cyst. Nonemergent MR to further evaluate this area remains advisable. A small neoplasm in this area cannot be excluded on this study. 2. Asymmetric thickening of the internal oblique muscle on the left laterally compared to the right. Significance of this finding uncertain. No hematoma or mass seen within this area of muscle asymmetry. 3. No bowel wall thickening or bowel obstruction. Scattered colonic diverticula without diverticulitis. No free air. Appendix appears normal.  No abscess in the abdomen or pelvis. 4.  Sizable hiatal hernia remains. 5.  Aortic Atherosclerosis (ICD10-I70.0). 6. No renal or ureteral calculus. No hydronephrosis. Urinary bladder wall thickness normal. Electronically Signed   By: Lowella Grip III M.D.   On: 01/26/2020 14:48   US RENAL  Result Date: 01/28/2020 CLINICAL DATA:  Adrenal nodule seen on CT. EXAM: RENAL / URINARY TRACT ULTRASOUND COMPLETE COMPARISON:  Body CT January 26, 2020 FINDINGS: Right Kidney: Renal measurements: 9.0 x 4.4 x 5.0 cm = volume: 104 mL. Mild cortical thinning. Two benign-appearing cysts are seen within the right kidney measuring 1.2 and 1.9 cm in greatest dimension. Left Kidney: Renal measurements: 8.2 x 4.6 x 5.7 cm = volume: 113 mL. Mild cortical thinning. There is a simple cyst off of the superior pole of the left kidney measuring 2.5 x 2.4 x 2.5 cm. A second simple cyst off of the  mid polar region of the left kidney measures 2.1 x 1.9 x 2.2 cm. There is a cluster of complicated cysts off of the lower outer cortex of the left kidney measuring collectively 4.4 x 1.7 x 3.5 cm, indeterminate. Bladder: Appears normal for degree of bladder distention. Other: None. IMPRESSION: 1. Mild bilateral cortical thinning of the kidneys. 2. Bilateral benign-appearing renal cysts. 3. 4.4 cm cluster of complicated cysts versus complex cystic mass off the lower outer cortex of the left kidney. Evaluation with nonemergent MRI, renal protocol, may be considered, as suggested on the recent abdominal CT dated January 26, 2020. Electronically Signed   By: Fidela Salisbury M.D.   On: 01/28/2020 15:41   DG Hand 2 View Left  Result Date: 01/26/2020 CLINICAL DATA:  Fall EXAM: LEFT HAND - 2 VIEW COMPARISON:  None. FINDINGS: No acute displaced fracture or malalignment. Joint space narrowing and degenerative change at the fourth and fifth MCP joints. Chronic appearing deformity of the first metacarpal presumably due to prior trauma. IMPRESSION: No acute osseous abnormality. Chronic appearing deformity of the first metacarpal Electronically Signed   By: Donavan Foil M.D.   On: 01/26/2020 22:50   DG Humerus Left  Result Date: 01/26/2020 CLINICAL DATA:  Fall EXAM: LEFT HUMERUS - 2+ VIEW COMPARISON:  03/22/2019 FINDINGS: There is no evidence of fracture or other focal bone lesions. Soft tissues are unremarkable. IMPRESSION: Negative. Electronically Signed   By: Donavan Foil M.D.   On: 01/26/2020 22:48   DG Swallowing Func-Speech Pathology  Result Date: 01/30/2020 Objective Swallowing Evaluation: Type of Study: MBS-Modified Barium Swallow Study  Patient Details Name: Edilberto Roosevelt MRN: [id number] Date of Birth: [birth year] Today's Date: 01/30/2020 Time: SLP Start Time (ACUTE ONLY): 5885 -SLP Stop Time (ACUTE ONLY): 0277 SLP Time Calculation (min) (ACUTE ONLY): 17 min Past Medical History: Past Medical History:  Diagnosis Date  Adenomatous polyp of colon 06/2010  4 polyps removed at colonoscopy by Dr Henrene Pastor  Anxiety   Chronic headaches   Depression   Diverticulosis of colon 08/2010  Internal hemorrhoids 08/2010  Prostate cancer [facility])   receiving radiation treatment  Subdural hematoma, chronic ([place])   Tuberculosis  Past Surgical History: Past Surgical History: Procedure Laterality Date  BIOPSY  01/29/2020  Procedure: BIOPSY;  Surgeon: Irene Shipper, MD;  Location: [facility] ENDOSCOPY;  Service: Endoscopy;;  COLONOSCOPY WITH PROPOFOL N/A 01/29/2020  Procedure: COLONOSCOPY WITH PROPOFOL;  Surgeon: Irene Shipper, MD;  Location: [place];  Service: Endoscopy;  Laterality: N/A;  ESOPHAGOGASTRODUODENOSCOPY (EGD) WITH PROPOFOL N/A 01/29/2020  Procedure: ESOPHAGOGASTRODUODENOSCOPY (EGD) WITH PROPOFOL;  Surgeon: Henrene Pastor,  Docia Chuck, MD;  Location: [facility] ENDOSCOPY;  Service: Endoscopy;  Laterality: N/A;  FLEXIBLE SIGMOIDOSCOPY  03/29/2012  Procedure: FLEXIBLE SIGMOIDOSCOPY;  Surgeon: Inda Castle, MD;  Location: [place];  Service: Endoscopy;  Laterality: N/A;  possibly may do colonoscopy but prep is only for a flex  FLEXIBLE SIGMOIDOSCOPY  03/31/2012  Procedure: FLEXIBLE SIGMOIDOSCOPY;  Surgeon: Inda Castle, MD;  Location: [place];  Service: Endoscopy;  Laterality: N/A;  HEMORRHOID BANDING  03/31/2012  Procedure: HEMORRHOID BANDING;  Surgeon: Inda Castle, MD;  Location: [place];  Service: Endoscopy;  Laterality: N/A;  HOT HEMOSTASIS N/A 01/29/2020  Procedure: HOT HEMOSTASIS (ARGON PLASMA COAGULATION/BICAP);  Surgeon: Irene Shipper, MD;  Location: [facility] ENDOSCOPY;  Service: Endoscopy;  Laterality: N/A;  MULTIPLE EXTRACTIONS WITH ALVEOLOPLASTY N/A 08/08/2013  Procedure: MULTIPLE EXTRACTION OF TEETH #1, 2, 3, 6, 7, 9, 11, 15, 16, 17, 20, 21, 22, 23, 24, 26, 29, 30, 32 WITH ALVEOLOPLASTY AND REMOVAL BUCCAL EXOSTOSIS LEFT MAXILLA;  Surgeon: Gae Bon, DDS;  Location: [place];  Service: Oral Surgery;  Laterality:  N/A;  shrapnel removal    skull during [place] HPI: 74 Y/O M with Subdural Hematoma, Prostate Cancer, Colonic adenomatous polyps, presented with abdominal pain that started a few days ago. Underwent EGD/Colonoscopy 11/15 with normal results on EGD.  Subjective: alert and cooperative Assessment / Plan / Recommendation CHL IP CLINICAL IMPRESSIONS 01/30/2020 Clinical Impression Pt has a moderate oral dysphagia also impacted by quick rate of consumption. Boluses reach his pharynx either by means of passive transit or short bursts of lingual pumping. There does not appear to be any attempt at bolus cohesion. Oral residue is diffuse, with liquids spilling even under the tongue. Masticate with a small bite of cracker is incomplete, with pt able to break it into smaller pieces but still swallowing them in unmasticated pieces. Boluses will spill into the valleculae and down into the pyriform sinuses, sitting throughout his pharynx until he triggers after a variable delay. He is often still trying to get additional, consecutive boluses in his mouth even before he has swallowed what is in his pharynx. When his rate becomes more rapid, and larger volumes fill his pharynx, he does start to aspirate thin liquids. The volume is trace, but only once did it elicit a cough. No aspirated material was cleared despite one instance of coughing. Recommend adjusting diet to Dys 2 (chopped) solids and nectar thick liquids. Suspect that he could potentially resume thin liquids in the future if his rate and/or oral control can be better regulated. Phone interpreter, Jenny Reichmann [phone], was utilized throughout this study.  SLP Visit Diagnosis Dysphagia, oropharyngeal phase (R13.12) Attention and concentration deficit following -- Frontal lobe and executive function deficit following -- Impact on safety and function Mild aspiration risk;Moderate aspiration risk   CHL IP TREATMENT RECOMMENDATION 01/30/2020 Treatment Recommendations Therapy as  outlined in treatment plan below   Prognosis 01/30/2020 Prognosis for Safe Diet Advancement Good Barriers to Reach Goals -- Barriers/Prognosis Comment -- CHL IP DIET RECOMMENDATION 01/30/2020 SLP Diet Recommendations Dysphagia 2 (Fine chop) solids;Nectar thick liquid Liquid Administration via Cup;Straw Medication Administration Whole meds with puree Compensations Minimize environmental distractions;Slow rate Postural Changes Seated upright at 90 degrees;Remain semi-upright after after feeds/meals (Comment)   CHL IP OTHER RECOMMENDATIONS 01/30/2020 Recommended Consults -- Oral Care Recommendations Oral care BID Other Recommendations --   CHL IP FOLLOW UP RECOMMENDATIONS 01/30/2020 Follow up Recommendations Skilled Nursing facility   [facility] IP FREQUENCY AND DURATION 01/30/2020 Speech Therapy  Frequency (ACUTE ONLY) min 2x/week Treatment Duration 2 weeks      CHL IP ORAL PHASE 01/30/2020 Oral Phase Impaired Oral - Pudding Teaspoon -- Oral - Pudding Cup -- Oral - Honey Teaspoon -- Oral - Honey Cup -- Oral - Nectar Teaspoon -- Oral - Nectar Cup Lingual pumping;Reduced posterior propulsion;Lingual/palatal residue;Delayed oral transit;Decreased bolus cohesion Oral - Nectar Straw Lingual pumping;Reduced posterior propulsion;Lingual/palatal residue;Delayed oral transit;Decreased bolus cohesion Oral - Thin Teaspoon -- Oral - Thin Cup Lingual pumping;Reduced posterior propulsion;Lingual/palatal residue;Delayed oral transit;Decreased bolus cohesion Oral - Thin Straw Lingual pumping;Reduced posterior propulsion;Lingual/palatal residue;Delayed oral transit;Decreased bolus cohesion Oral - Puree Lingual pumping;Reduced posterior propulsion;Lingual/palatal residue;Delayed oral transit;Decreased bolus cohesion Oral - Mech Soft Lingual pumping;Reduced posterior propulsion;Lingual/palatal residue;Delayed oral transit;Decreased bolus cohesion;Impaired mastication Oral - Regular -- Oral - Multi-Consistency -- Oral - Pill -- Oral Phase -  Comment --  CHL IP PHARYNGEAL PHASE 01/30/2020 Pharyngeal Phase Impaired Pharyngeal- Pudding Teaspoon -- Pharyngeal -- Pharyngeal- Pudding Cup -- Pharyngeal -- Pharyngeal- Honey Teaspoon -- Pharyngeal -- Pharyngeal- Honey Cup -- Pharyngeal -- Pharyngeal- Nectar Teaspoon -- Pharyngeal -- Pharyngeal- Nectar Cup Delayed swallow initiation-pyriform sinuses Pharyngeal -- Pharyngeal- Nectar Straw Delayed swallow initiation-pyriform sinuses Pharyngeal -- Pharyngeal- Thin Teaspoon -- Pharyngeal -- Pharyngeal- Thin Cup Delayed swallow initiation-pyriform sinuses;Penetration/Aspiration before swallow Pharyngeal Material enters airway, passes BELOW cords without attempt by patient to eject out (silent aspiration) Pharyngeal- Thin Straw Delayed swallow initiation-pyriform sinuses;Penetration/Aspiration before swallow Pharyngeal Material enters airway, passes BELOW cords without attempt by patient to eject out (silent aspiration) Pharyngeal- Puree Delayed swallow initiation-pyriform sinuses Pharyngeal -- Pharyngeal- Mechanical Soft Delayed swallow initiation-vallecula Pharyngeal -- Pharyngeal- Regular -- Pharyngeal -- Pharyngeal- Multi-consistency -- Pharyngeal -- Pharyngeal- Pill -- Pharyngeal -- Pharyngeal Comment --  CHL IP CERVICAL ESOPHAGEAL PHASE 01/30/2020 Cervical Esophageal Phase WFL Pudding Teaspoon -- Pudding Cup -- Honey Teaspoon -- Honey Cup -- Nectar Teaspoon -- Nectar Cup -- Nectar Straw -- Thin Teaspoon -- Thin Cup -- Thin Straw -- Puree -- Mechanical Soft -- Regular -- Multi-consistency -- Pill -- Cervical Esophageal Comment -- Osie Bond., M.A. [facility] Pager [phone] Office [phone] 01/30/2020, 4:07 PM              ECHOCARDIOGRAM COMPLETE  Result Date: 01/27/2020    ECHOCARDIOGRAM REPORT   Patient Name:   [facility]  Date of Exam: 01/27/2020 Medical Rec #:  [id number]  Height:       65.0 in Accession #:    [id number] Weight:       131.6 lb Date of Birth:  07/27/1945   BSA:           1.656 m Patient Age:    58 years   BP:           108/97 mmHg Patient Gender: M          HR:           70 bpm. Exam Location:  Inpatient Procedure: 2D Echo, Cardiac Doppler and Color Doppler Indications:   R94.31 Abnormal EKG  History:       Patient has no prior history of Echocardiogram examinations.                Cancer.  Sonographer:   Jonelle Sidle Dance Referring      [place]: IMPRESSIONS  1. Left ventricular ejection fraction, by estimation, is 60 to 65%. The left ventricle has normal function. The left ventricle has no regional wall motion abnormalities. Left ventricular diastolic parameters were normal.  2. Right ventricular systolic function is normal. The right ventricular size is normal.  3. The mitral valve is normal in structure. No evidence of mitral valve regurgitation. No evidence of mitral stenosis.  4. The aortic valve is normal in structure. Aortic valve regurgitation is not visualized. No aortic stenosis is present.  5. The inferior vena cava is normal in size with greater than 50% respiratory variability, suggesting right atrial pressure of 3 mmHg. FINDINGS  Left Ventricle: Left ventricular ejection fraction, by estimation, is 60 to 65%. The left ventricle has normal function. The left ventricle has no regional wall motion abnormalities. The left ventricular internal cavity size was normal in size. There is  no left ventricular hypertrophy. Left ventricular diastolic parameters were normal. Normal left ventricular filling pressure. Right Ventricle: The right ventricular size is normal. No increase in right ventricular wall thickness. Right ventricular systolic function is normal. Left Atrium: Left atrial size was normal in size. Right Atrium: Right atrial size was normal in size. Pericardium: There is no evidence of pericardial effusion. Mitral Valve: The mitral valve is normal in structure. No evidence of mitral valve regurgitation. No evidence of mitral valve stenosis.  Tricuspid Valve: The tricuspid valve is normal in structure. Tricuspid valve regurgitation is not demonstrated. No evidence of tricuspid stenosis. Aortic Valve: The aortic valve is normal in structure. Aortic valve regurgitation is not visualized. No aortic stenosis is present. Pulmonic Valve: The pulmonic valve was normal in structure. Pulmonic valve regurgitation is not visualized. No evidence of pulmonic stenosis. Aorta: The aortic root is normal in size and structure. Venous: The inferior vena cava is normal in size with greater than 50% respiratory variability, suggesting right atrial pressure of 3 mmHg. IAS/Shunts: No atrial level shunt detected by color flow Doppler.  LEFT VENTRICLE PLAX 2D LVIDd:         2.75 cm LVIDs:         2.13 cm LV PW:         0.97 cm LV IVS:        0.94 cm LVOT diam:     2.20 cm LV SV:         78 LV SV Index:   47 LVOT Area:     3.80 cm  RIGHT VENTRICLE             IVC RV Basal diam:  2.20 cm     IVC diam: 1.47 cm RV S prime:     10.40 cm/s TAPSE (M-mode): 1.8 cm LEFT ATRIUM             Index       RIGHT ATRIUM           Index LA diam:        3.50 cm 2.11 cm/m  RA Area:     10.00 cm LA Vol (A2C):   26.5 ml 16.00 ml/m RA Volume:   17.90 ml  10.81 ml/m LA Vol (A4C):   48.9 ml 29.53 ml/m LA Biplane Vol: 35.9 ml 21.68 ml/m  AORTIC VALVE LVOT Vmax:   93.20 cm/s LVOT Vmean:  58.100 cm/s LVOT VTI:    0.206 m  AORTA Ao Root diam: 3.40 cm Ao Asc diam:  3.20 cm MITRAL VALVE MV Area (PHT): 3.27 cm     SHUNTS MV Decel Time: 232 msec     Systemic VTI:  0.21 m MV E velocity: 84.40 cm/s   Systemic Diam: 2.20 cm MV A velocity: 105.00 cm/s MV E/A ratio:  0.80 Sanda Klein MD Electronically signed by Sanda Klein MD Signature Date/Time: 01/27/2020/1:12:03 PM    Final    DG FEMUR 1V LEFT  Result Date: 01/26/2020 CLINICAL DATA:  Fall EXAM: LEFT FEMUR 1 VIEW COMPARISON:  05/07/2014 FINDINGS: There is no evidence of fracture or other focal bone lesions. Soft tissues are unremarkable.  IMPRESSION: Negative. Electronically Signed   By: Donavan Foil M.D.   On: 01/26/2020 22:52   DG FEMUR 1V RIGHT  Result Date: 01/26/2020 CLINICAL DATA:  Fall EXAM: RIGHT FEMUR 1 VIEW COMPARISON:  05/07/2014 FINDINGS: There is no evidence of fracture or other focal bone lesions. Soft tissues are unremarkable. IMPRESSION: Negative. Electronically Signed   By: Donavan Foil M.D.   On: 01/26/2020 22:52   VAS [name] LOWER EXTREMITY VENOUS (DVT) (ONLY [facility] & [facility])  Result Date: 01/27/2020  Lower Venous DVT Study Indications: Swelling.  Limitations: Body habitus, poor ultrasound/tissue interface and contracted legs. Comparison Study: No prior study Performing Technologist: Maudry Mayhew MHA, RDMS, RVT, RDCS  Examination Guidelines: A complete evaluation includes B-mode imaging, spectral Doppler, color Doppler, and power Doppler as needed of all accessible portions of each vessel. Bilateral testing is considered an integral part of a complete examination. Limited examinations for reoccurring indications may be performed as noted. The reflux portion of the exam is performed with the patient in reverse Trendelenburg.  +---------+---------------+---------+-----------+----------+--------------+  RIGHT     Compressibility Phasicity Spontaneity Properties Thrombus Aging  +---------+---------------+---------+-----------+----------+--------------+  CFV                       Yes       Yes         patent                     +---------+---------------+---------+-----------+----------+--------------+  SFJ                                 Yes         patent                     +---------+---------------+---------+-----------+----------+--------------+  FV Prox                             Yes         patent                     +---------+---------------+---------+-----------+----------+--------------+  FV Mid                              Yes         patent                      +---------+---------------+---------+-----------+----------+--------------+  FV Distal                           Yes                                    +---------+---------------+---------+-----------+----------+--------------+  POP       Full            Yes       Yes  patent                     +---------+---------------+---------+-----------+----------+--------------+   Right Technical Findings: Not visualized segments include PFV, PTV, peroneal veins.  +---------+---------------+---------+-----------+----------+--------------+  LEFT      Compressibility Phasicity Spontaneity Properties Thrombus Aging  +---------+---------------+---------+-----------+----------+--------------+  FV Mid    Full                      Yes         patent                     +---------+---------------+---------+-----------+----------+--------------+  FV Distal                           Yes         patent                     +---------+---------------+---------+-----------+----------+--------------+  POP       Full            Yes       Yes         patent                     +---------+---------------+---------+-----------+----------+--------------+  PTV       Full                                  patent                     +---------+---------------+---------+-----------+----------+--------------+  PERO      Full                                  patent                     +---------+---------------+---------+-----------+----------+--------------+   Left Technical Findings: Not visualized segments include CFV, SFJ, FV proximal, PFV,.   Summary: RIGHT: - There is no evidence of deep vein thrombosis in the lower extremity. However, portions of this examination were limited- see technologist comments above.  - No cystic structure found in the popliteal fossa.  LEFT: - There is no evidence of deep vein thrombosis in the lower extremity. However, portions of this examination were limited- see technologist comments above.  - No cystic  structure found in the popliteal fossa.  *See table(s) above for measurements and observations. Electronically signed by Ruta Hinds MD on 01/27/2020 at 11:50:15 AM.    Final    Armando Reichert, MD 01/31/2020, 7:04 AM PGY1, [place] Intern pager: [phone], text pages welcome

## 2020-01-31 NOTE — TOC Initial Note (Addendum)
Transition of Care [facility]) - Initial/Assessment Note    Patient Details  Name: [name] MRN: [id number] Date of Birth: 16-Jun-1945  [facility]) CM/SW Contact:    [address], [place] Phone Number: 01/31/2020, 2:29 PM  Clinical Narrative:                 Update: Pt's daughter returned CSW's call. Pt's daughter stated that pt receives a monthly check. CSW adv pt that due to pt having Medicaid, the SNF would require pt to turn the check over to them. Pt's daughter stated that pt needs his check. Pt's daughter stated pt wanted to go home. Pt daughter expressed interest in seeing what [place] options pt may qualify for. RMCM made aware. MD made aware of insurance barrier of pt going to SNF vs going home.   CSW reached out to pt's daughter to see if the family had made a decision in regards to pt going to SNF or going home with [facility]. CSW had to leave a vm. CSW will continue to follow.         Patient Goals and CMS Choice        Expected Discharge Plan and Services                                                Prior Living Arrangements/Services                       Activities of Daily Living      Permission Sought/Granted                  Emotional Assessment              Admission diagnosis:  Leg swelling [M79.89] Decreased hemoglobin [R71.0] Dysphagia, unspecified type [R13.10] Patient Active Problem List   Diagnosis Date Noted  . Radiation proctitis   . Benign neoplasm of sigmoid colon   . Gastroesophageal reflux disease without esophagitis   . Decreased hemoglobin   . Iron deficiency anemia   . Leg swelling 01/26/2020  . Abdominal pain   . Acute pancreatitis   . Dysuria 11/18/2017  . History of colonic polyps 10/01/2015  . Moderate dementia without behavioral disturbance ([place]) 03/22/2015  . Right-sided low back pain with right-sided sciatica 11/30/2014  . Neuropathy 11/30/2014  . Vitamin D insufficiency 07/09/2014  .  Right-sided low back pain without sciatica 07/06/2014  . Memory loss 06/24/2014  . Anemia of chronic disease 05/15/2014  . Internal hemorrhoids with complication 41/93/7902  . Heme positive stool 05/09/2014  . Degenerative disc disease, lumbar 05/06/2014  . Foraminal stenosis of lumbar region 05/06/2014  . Depression 12/13/2013  . Prostate cancer ([place]) 12/13/2013  . Rectal bleed 03/27/2012  . HYPERCHOLESTEROLEMIA, MILD 05/19/2010  . 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 Constipation 09/23/2009  . BPH with urinary obstruction 09/23/2009  . HEADACHE 09/23/2009   PCP:  Matilde Haymaker, MD Pharmacy:   [facility], [address] [place] [address] Phone: [phone] Fax: [facility] [phone] - [place], [place] - [address] AT [place] [address] [place] [address] Phone: [phone] Fax: [phone]  Social Determinants of Health (SDOH) Interventions    Readmission Risk Interventions No flowsheet data found.

## 2020-01-31 NOTE — Progress Notes (Signed)
Spoke with patient's daughter, Danzig Macgregor, over the phone to clarify [place].  She does confirm that she would like him to be full code.  Wilber Oliphant, M.D.  2:06 PM 01/31/2020

## 2020-02-01 DIAGNOSIS — K5521 Angiodysplasia of colon with hemorrhage: Secondary | ICD-10-CM | POA: Diagnosis not present

## 2020-02-01 DIAGNOSIS — R710 Precipitous drop in hematocrit: Secondary | ICD-10-CM | POA: Diagnosis not present

## 2020-02-01 DIAGNOSIS — K921 Melena: Secondary | ICD-10-CM | POA: Diagnosis not present

## 2020-02-01 LAB — CBC
HCT: 30.8 % — ABNORMAL LOW (ref 39.0–52.0)
Hemoglobin: 10.3 g/dL — ABNORMAL LOW (ref 13.0–17.0)
MCH: 34.6 pg — ABNORMAL HIGH (ref 26.0–34.0)
MCHC: 33.4 g/dL (ref 30.0–36.0)
MCV: 103.4 fL — ABNORMAL HIGH (ref 80.0–100.0)
Platelets: 301 10*3/uL (ref 150–400)
RBC: 2.98 MIL/uL — ABNORMAL LOW (ref 4.22–5.81)
RDW: 12.4 % (ref 11.5–15.5)
WBC: 6.2 10*3/uL (ref 4.0–10.5)
nRBC: 0 % (ref 0.0–0.2)

## 2020-02-01 NOTE — Progress Notes (Signed)
Pt up multiple times over night to have BM. Only produced BM once. C/o stomach pain. Provider notified.   Pt also had small amount of BRBPR. Provider notified

## 2020-02-01 NOTE — Progress Notes (Signed)
Physical Therapy Treatment Patient Details Name: [name] MRN: [id number] DOB: Jul 20, 1945 Today's Date: 02/01/2020    History of Present Illness 74 Y/O M with Subdural Hematoma, Prostate Cancer, Colonic adenomatous polyps, presented with abdominal pain that started a few days ago. He denies N/V, his last BM was yesterday, and he endorsed seeing small blood in it. He also endorsed nasal bleed yesterday but none today.    PT Comments    Pt supine in bed on arrival this session.  Utilized [employer] video interpreter # [phone] [name].  Pt continues to mumble and makes translation difficult but he does respond to commands spoken in [place] and follows them.  Pt continues to benefit from snf placement as he continues to require moderate assistance to mobilize and maintain balance.     Follow Up Recommendations  SNF;Supervision/Assistance - 24 hour     Equipment Recommendations  3in1 (PT);Wheelchair (measurements PT);Wheelchair cushion (measurements PT)    Recommendations for Other Services       Precautions / Restrictions Precautions Precautions: Fall Restrictions Weight Bearing Restrictions: No    Mobility  Bed Mobility Overal bed mobility: Needs Assistance Bed Mobility: Supine to Sit     Supine to sit: Mod assist Sit to supine: Min guard   General bed mobility comments: Pt require mod assistance to rise into standing at edge of bed.  Min assistance to return back to bed.  Transfers Overall transfer level: Needs assistance Equipment used: None Transfers: Sit to/from Stand Sit to Stand: Min guard Stand pivot transfers: Min assist       General transfer comment: Pt impulsive to rise into standing.  Ambulation/Gait Ambulation/Gait assistance: Mod assist Gait Distance (Feet): 60 Feet Assistive device: 1 person hand held assist Gait Pattern/deviations: Step-through pattern;Decreased stride length;Trunk flexed Gait velocity: decreased   General Gait Details: Performed HHA on R  due to implusivity mod assistance to support trunk and maintain balance.   Stairs             Wheelchair Mobility    Modified Rankin (Stroke Patients Only)       Balance Overall balance assessment: Needs assistance Sitting-balance support: Feet supported;No upper extremity supported Sitting balance-Leahy Scale: Fair Sitting balance - Comments: pt noted to lean laterally to Left needing at least min guard for static sitting EOB Postural control: Left lateral lean Standing balance support: Bilateral upper extremity supported;During functional activity Standing balance-Leahy Scale: Poor Standing balance comment: BUE supported                            Cognition Arousal/Alertness: Awake/alert Behavior During Therapy: Impulsive Overall Cognitive Status: No family/caregiver present to determine baseline cognitive functioning                                 General Comments: Interpreter utilized for translation, per interpreter he remains difficult to understand but he does appear to understand english commands.      Exercises      General Comments General comments (skin integrity, edema, etc.): pt HR 117 at rest, pt repeatedly stating "blood pressure' upon arrrival. BP 109/77 prior to mobilizing and 113/82 after gettign to chair. HR max 121 bpm with mobility. interpreter [name] # [id number]      Pertinent Vitals/Pain Pain Assessment: Faces Faces Pain Scale: No hurt    Home Living  Prior Function            PT Goals (current goals can now be found in the care plan section) Acute Rehab PT Goals Patient Stated Goal: did not state Potential to Achieve Goals: Fair Progress towards PT goals: Progressing toward goals    Frequency    Min 2X/week      PT Plan Current plan remains appropriate    Co-evaluation              AM-PAC PT "6 Clicks" Mobility   Outcome Measure  Help needed turning from your  back to your side while in a flat bed without using bedrails?: None Help needed moving from lying on your back to sitting on the side of a flat bed without using bedrails?: A Little Help needed moving to and from a bed to a chair (including a wheelchair)?: A Little Help needed standing up from a chair using your arms (e.g., wheelchair or bedside chair)?: A Little Help needed to walk in hospital room?: A Little Help needed climbing 3-5 steps with a railing? : A Little 6 Click Score: 19    End of Session Equipment Utilized During Treatment: Gait belt Activity Tolerance: Patient tolerated treatment well Patient left: in bed;with call bell/phone within reach;with bed alarm set Nurse Communication: Mobility status PT Visit Diagnosis: Unsteadiness on feet (R26.81);Other abnormalities of gait and mobility (R26.89);History of falling (Z91.81);Repeated falls (R29.6)     Time: 3536-1443 PT Time Calculation (min) (ACUTE ONLY): 18 min  Charges:  $Gait Training: 8-22 mins                     [name] , PTA Acute Rehabilitation Services Pager [phone] Office [phone]     [name] 02/01/2020, 11:44 AM

## 2020-02-01 NOTE — Progress Notes (Signed)
Family Medicine Teaching Service Daily Progress Note Intern Pager: [phone]  Patient name: [name] Medical record number: [id number] Date of birth: 1946/02/29 Age: 73 y.o. Gender: male  Primary Care Provider: Matilde Haymaker, MD Consultants: GI Code Status: Full  Pt Overview and Major Events to Date: Admitted 11/12 for abdominal pain  Assessment and Plan: [name] a 74 y.o.malepresenting with lower extremity edema and abdominal pain. PMH is significant fordementia, frequent falls, chronic subdural hematoma, CKD stage 3, and oropharyngeal dysphagia.  Of note, history is extremely limited as patient is vietnamese speaking only and interpreters are unable to understand patient, as he is edentulous.  Abdominal pain with constipation This morning patientPt is doing good. Denies any complaints. Endoscopy and Colonoscopy was done.  Endoscopy shows moderate sliding hiatal hernia. Colonoscopy shows A 3 mm polyp was removed from sigmoid colon. Multiple diverticula were found in the sigmoid colon and right colon.  Bleeding was found in the rectum and Coagulation done. His vitals are stable today. Hb isimproving10.3 (8.5 yesterday and 9.7 at admission).MCV 103.4. Vitals stable. Afebrile. -GI consulted. Appreciate recommendations. -OOB with assistance. -Vitals per routine --Chopped solidssmall bites, sips with straw and Meds with Puree. -IV Protonix 40 mg twice daily.  Lower extremity edema B/L Edema presentimproving. Net Intake of -1723.9 His creatininetoday is  1.97 from 2.06.  Echocardiogramwas essentially normal -Continue to monitor lower extremity edema -Consideradditional diuresis if Edema increases. - Monitor I/O  History of recurrent falls with recent fall PT, OT eval donerecommended SNF placement. -Out of bed with assistance --Disposition to SNF.  Home situation with history of dementia and language barrier Patient requires 24-hour care but does not  have a clear that that is occurring at home. Daughter still thinking about SNF placement as SNF will take his disability check. D[facility]. -Pending safe discharge planning. -Transitions of care consultat D/S  Anemia, macrocytic  Hb isimproving to 10.5(8.5 yest)with MCV of103.4.Vitamin B12 and Folate levels normal. -Monitor CBC -Continue PPI twice daily -Continue Ferrous sulphate 325 every other day.  CKD III  GFR 40's at baseline. Yesterday, creatinine is 1.97.Creatinine 1.5 at baseline. No further diuresis at this time. I v fluids stopped yesterday.  -Monitor BMP. -Avoid other neprhotoxic meds.  FEN/GI:Pt is onregular diet. Small bites and sips with straw. UDJ:SHFWYOV Disposition: Med tele now pending safe discharge. Subjective:  This morning patientPt is doing good. Denies any complaints.  Objective: Temp:  [97.3 F (36.3 C)-97.9 F (36.6 C)] 97.7 F (36.5 C) (11/18 0313) Pulse Rate:  [78-118] 78 (11/18 0313) Resp:  [15-19] 18 (11/18 0313) BP: (96-134)/(66-91) 110/91 (11/18 0313) SpO2:  [97 %-98 %] 98 % (11/18 0313) Weight:  [57.8 kg] 57.8 kg (11/18 0313) Physical Exam: General:Not in acute stress.sitting comfortably. Cardiovascular:S1 S2 normal, No murmurs Respiratory:Normal breathing sounds Abdomen:soft, Non tender Extremities:B/L edema present less than at admission.  Laboratory: Recent Labs  Lab 01/30/20 0613 01/31/20 0348 02/01/20 0351  WBC 5.6 4.2 6.2  HGB 8.6* 8.5* 10.3*  HCT 25.5* 25.0* 30.8*  PLT 250 270 301   Recent Labs  Lab 01/26/20 1017 01/26/20 1017 01/27/20 0443 01/28/20 0124 01/29/20 1359 01/30/20 0842 01/31/20 0348  NA 137   < > 137   < > 140 138 139  K 4.7   < > 4.7   < > 4.1 4.7 4.5  CL 108   < > 106   < > 107 109 110  CO2 21*   < > 23   < >  23 23 23   BUN 35*   < > 35*   < > 13 13 15   CREATININE 1.73*   < > 2.08*   < > 1.80* 2.06* 1.97*  CALCIUM 8.8*   < > 9.0   < > 8.6* 8.6* 8.6*   PROT 6.5  --  6.6  --   --   --  5.5*  BILITOT 0.8  --  1.3*  --   --   --  0.3  ALKPHOS 78  --  83  --   --   --  61  ALT 39  --  38  --   --   --  30  AST 50*  --  47*  --   --   --  37  GLUCOSE 92   < > 85   < > 115* 116* 96   < > = values in this interval not displayed.     Imaging/Diagnostic Tests: No new results.  Ross Reichert, MD 02/01/2020, 7:00 AM PGY-1, [place] Intern pager: [phone], text pages welcome

## 2020-02-01 NOTE — Progress Notes (Signed)
    Durable Medical Equipment  (From admission, onward)         Start     Ordered   02/01/20 1533  For home use only DME lightweight manual wheelchair with seat cushion  Once       Comments: Patient suffers from weakness which impairs their ability to perform daily activities like bathing or dressing in the home.  A walker or cane will not resolve  issue with performing activities of daily living. A wheelchair will allow patient to safely perform daily activities. Patient is not able to propel themselves in the home using a standard weight wheelchair due to weakness. Patient can self propel in the lightweight wheelchair. Length of need lifetime. Accessories: elevating leg rests (ELRs), wheel locks, extensions and anti-tippers.   02/01/20 1533   02/01/20 1532  For home use only DME Shower stool  Once        02/01/20 1532

## 2020-02-01 NOTE — TOC Transition Note (Addendum)
Transition of Care [facility]) - CM/SW Discharge Note   Patient Details  Name: [name] MRN: [id number] Date of Birth: April 04, 1945  Transition of Care [facility]) CM/SW Contact:  Zenon Mayo, RN Phone Number: 02/01/2020, 4:25 PM   Clinical Narrative:    NCM spoke with patient's daughter, offered choice, she states she does not have a preference.  NCM made referral to Amy with [facility] .  She will check and call this NCM back.  Daughter states he will need a wheelchair and a shower stool.  NCM made referral to [facility] with [facility]. DME will be brought up to patient's room prior to dc.    Final next level of care: [place] Barriers to Discharge: Continued Medical Work up   Patient Goals and CMS Choice Patient states their goals for this hospitalization and ongoing recovery are:: go home ,get better CMS Medicare.gov Compare Post Acute Care list provided to:: Patient Represenative (must comment) Choice offered to / list presented to : Adult Children  Discharge Placement                       Discharge Plan and Services                DME Arranged: Lightweight manual wheelchair with seat cushion, Shower stool DME Agency: [facility] Date DME Agency Contacted: 02/01/20 Time DME Agency Contacted: [phone] Representative spoke with at DME Agency: Adela Lank HH Arranged: PT, OT [place] Agency: [facility] [place] Date [place]: 02/01/20 Time [place]: 1625 Representative spoke with at [place]: Amy  Social Determinants of Health ([place]) Interventions     Readmission Risk Interventions No flowsheet data found.

## 2020-02-01 NOTE — Progress Notes (Signed)
Occupational Therapy Treatment Patient Details Name: [name] MRN: [id number] DOB: January 22, 1946 Today's Date: 02/01/2020    History of present illness 74 Y/O M with Subdural Hematoma, Prostate Cancer, Colonic adenomatous polyps, presented with abdominal pain that started a few days ago. He denies N/V, his last BM was yesterday, and he endorsed seeing small blood in it. He also endorsed nasal bleed yesterday but none today.   OT comments  Pt making gradual progress towards OT goals this session. Utilized stratus interpreter during session [name] [phone]. Per interpreter, pts speech is garbled and pt often repeats words. Initially at start of session pt stating " blood pressure" and "blood in stool." BP WNL throughout session ( see comments section for vitals). Pt was able to transfer OOB with MIN A to pivot to recliner. Pt currently requires supervision for UB ADLs and Min guard for LB ADLs. Pt follows commands with increased time but noted to perseverate on extraneous stimuli needing cues to redirect back to session. Pt continues to present with  cognitive deficits, decreased activity tolerance, generalized weakness and balance impairments imacpting pts ability to complete BADLs independently. Pt would continue to benefit from skilled occupational therapy while admitted and after d/c to address the below listed limitations in order to improve overall functional mobility and facilitate independence with BADL participation. DC plan remains appropriate pending continued progression and family support, will follow acutely per POC.     Follow Up Recommendations  Home health OT    Equipment Recommendations  Tub/shower seat    Recommendations for Other Services      Precautions / Restrictions Precautions Precautions: Fall Restrictions Weight Bearing Restrictions: No       Mobility Bed Mobility Overal bed mobility: Needs Assistance Bed Mobility: Supine to Sit     Supine to sit: Min assist      General bed mobility comments: MinA to initiate and elevate trunk into sitting.  Transfers Overall transfer level: Needs assistance Equipment used: Rolling walker (2 wheeled) Transfers: Sit to/from [facility] Sit to Stand: Min assist Stand pivot transfers: Min assist       General transfer comment: MIN to power into standing, cues for hand placement throughout transfer. MIN to manage RW during transfer and initiate steps in correct direction of the chair    Balance Overall balance assessment: Needs assistance Sitting-balance support: Feet supported;No upper extremity supported Sitting balance-Leahy Scale: Poor Sitting balance - Comments: pt noted to lean laterally to Left needing at least min guard for static sitting EOB Postural control: Left lateral lean Standing balance support: Bilateral upper extremity supported;During functional activity Standing balance-Leahy Scale: Poor Standing balance comment: BUE supported                           ADL either performed or assessed with clinical judgement   ADL       Grooming: Wash/dry face;Supervision/safety;Set up;Bed level;Total assistance;Cueing for sequencing Grooming Details (indicate cue type and reason): pt initally required total A to wash pts hands and face however pt did take over task with cues and with set- up/ supervision assist     Lower Body Bathing: Min guard;Sitting/lateral leans Lower Body Bathing Details (indicate cue type and reason): simulated via LB dressing task     Lower Body Dressing: Min guard;Sitting/lateral leans Lower Body Dressing Details (indicate cue type and reason): pt able to adjust socks from EOB with minguard for balance when reaching out of BOS Toilet Transfer: Minimal  assistance;Stand-pivot;RW;Cueing for safety;Cueing for sequencing Toilet Transfer Details (indicate cue type and reason): simulated via functional to recliner, MIN A for balance and RW mgmt, impaired  safety awanress noted needing cues for hand placement and safe descent into chair         Functional mobility during ADLs: Minimal assistance;Cueing for safety;Rolling walker General ADL Comments: pt continues to be limited by cognitive deficits, decreased activity tolerance, generalized weakness and balance impairments     Vision       Perception     Praxis      Cognition Arousal/Alertness: Awake/alert Behavior During Therapy: Impulsive Overall Cognitive Status: No family/caregiver present to determine baseline cognitive functioning                                 General Comments: pt perseverating on words  per interpreter such as "blood pressure" and "blood in stool" pt also liable at end of session as OTA reported to pt that his BP increased after mobility and pt noted to begin crying. Pt following commands with increased time but per interpreter pts speech is garbled and difficult to understand, often repeating single words stated by therapist. pt unable to ever state location even with choices provided        Exercises     Shoulder Instructions       General Comments pt HR 117 at rest, pt repeatedly stating "blood pressure' upon arrrival. BP 109/77 prior to mobilizing and 113/82 after gettign to chair. HR max 121 bpm with mobility. interpreter [name] # [phone]    Pertinent Vitals/ Pain       Pain Assessment: Faces Faces Pain Scale: No hurt  Home Living                                          Prior Functioning/Environment              Frequency  Min 2X/week        Progress Toward Goals  OT Goals(current goals can now be found in the care plan section)  Progress towards OT goals: Progressing toward goals  Acute Rehab OT Goals Patient Stated Goal: did not state OT Goal Formulation: With patient/family Time For Goal Achievement: 02/10/20 Potential to Achieve Goals: [place] Discharge plan remains appropriate;Frequency  remains appropriate    Co-evaluation                 AM-PAC OT "6 Clicks" Daily Activity     Outcome Measure   Help from another person eating meals?: A Little Help from another person taking care of personal grooming?: A Little Help from another person toileting, which includes using toliet, bedpan, or urinal?: A Lot Help from another person bathing (including washing, rinsing, drying)?: A Lot Help from another person to put on and taking off regular upper body clothing?: None Help from another person to put on and taking off regular lower body clothing?: A Little 6 Click Score: 17    End of Session Equipment Utilized During Treatment: Gait belt;Rolling walker  OT Visit Diagnosis: Unsteadiness on feet (R26.81);Muscle weakness (generalized) (M62.81);History of falling (Z91.81)   Activity Tolerance Patient tolerated treatment well   Patient Left in chair;with call bell/phone within reach;with chair alarm set   Nurse Communication Mobility status  Time: 0254-8628 OT Time Calculation (min): 30 min  Charges: OT General Charges $OT Visit: 1 Visit OT Treatments $Self Care/Home Management : 23-37 mins  Lanier Clam., COTA/L Acute Rehabilitation Services [phone] [place] 02/01/2020, 9:32 AM

## 2020-02-02 DIAGNOSIS — K921 Melena: Secondary | ICD-10-CM | POA: Diagnosis not present

## 2020-02-02 DIAGNOSIS — R710 Precipitous drop in hematocrit: Secondary | ICD-10-CM | POA: Diagnosis not present

## 2020-02-02 DIAGNOSIS — K5521 Angiodysplasia of colon with hemorrhage: Secondary | ICD-10-CM | POA: Diagnosis not present

## 2020-02-02 LAB — CBC
HCT: 29.5 % — ABNORMAL LOW (ref 39.0–52.0)
Hemoglobin: 9.8 g/dL — ABNORMAL LOW (ref 13.0–17.0)
MCH: 34 pg (ref 26.0–34.0)
MCHC: 33.2 g/dL (ref 30.0–36.0)
MCV: 102.4 fL — ABNORMAL HIGH (ref 80.0–100.0)
Platelets: 300 10*3/uL (ref 150–400)
RBC: 2.88 MIL/uL — ABNORMAL LOW (ref 4.22–5.81)
RDW: 12.5 % (ref 11.5–15.5)
WBC: 5.9 10*3/uL (ref 4.0–10.5)
nRBC: 0 % (ref 0.0–0.2)

## 2020-02-02 MED ORDER — RESOURCE THICKENUP CLEAR PO POWD
1.0000 | ORAL | 0 refills | Status: DC | PRN
Start: 2020-02-02 — End: 2020-02-02

## 2020-02-02 MED ORDER — PANTOPRAZOLE SODIUM 40 MG PO TBEC
40.0000 mg | DELAYED_RELEASE_TABLET | Freq: Every day | ORAL | 0 refills | Status: DC
Start: 2020-02-03 — End: 2021-10-27

## 2020-02-02 MED ORDER — POLYETHYLENE GLYCOL 3350 17 G PO PACK
17.0000 g | PACK | Freq: Two times a day (BID) | ORAL | 0 refills | Status: DC
Start: 2020-02-02 — End: 2021-07-24

## 2020-02-02 MED ORDER — PANTOPRAZOLE SODIUM 40 MG PO TBEC
40.0000 mg | DELAYED_RELEASE_TABLET | Freq: Every day | ORAL | 0 refills | Status: DC
Start: 2020-02-03 — End: 2020-02-02

## 2020-02-02 MED ORDER — FERROUS SULFATE 325 (65 FE) MG PO TABS
325.0000 mg | ORAL_TABLET | ORAL | 0 refills | Status: DC
Start: 2020-02-03 — End: 2021-07-25

## 2020-02-02 MED ORDER — POLYETHYLENE GLYCOL 3350 17 G PO PACK
17.0000 g | PACK | Freq: Two times a day (BID) | ORAL | 0 refills | Status: DC
Start: 2020-02-02 — End: 2020-02-02

## 2020-02-02 MED ORDER — RESOURCE THICKENUP CLEAR PO POWD
1.0000 | ORAL | 0 refills | Status: DC | PRN
Start: 2020-02-02 — End: 2021-10-27

## 2020-02-02 MED ORDER — HEPARIN SODIUM (PORCINE) 5000 UNIT/ML IJ SOLN: 5000.0000 [IU] | Freq: Three times a day (TID) | INTRAMUSCULAR | Status: DC

## 2020-02-02 MED ORDER — SENNA 8.6 MG PO TABS
1.0000 | ORAL_TABLET | Freq: Every day | ORAL | 0 refills | Status: DC
Start: 2020-02-03 — End: 2021-07-24

## 2020-02-02 MED ORDER — FERROUS SULFATE 325 (65 FE) MG PO TABS
325.0000 mg | ORAL_TABLET | ORAL | 0 refills | Status: DC
Start: 2020-02-03 — End: 2020-02-02

## 2020-02-02 MED ORDER — SENNA 8.6 MG PO TABS
1.0000 | ORAL_TABLET | Freq: Every day | ORAL | 0 refills | Status: DC
Start: 2020-02-03 — End: 2020-02-02

## 2020-02-02 NOTE — Progress Notes (Signed)
ANTICOAGULATION CONSULT NOTE - Initial Consult  Pharmacy Consult for enoxaparin Indication: VTE prophylaxis  No Known Allergies  Patient Measurements: Height: 5\' 6"  (167.6 cm) Weight: 56.1 kg (123 lb 10.9 oz) IBW/kg (Calculated) : 63.8   Vital Signs: Temp: 97.8 F (36.6 C) (11/19 0436) Temp Source: Oral (11/19 0436) BP: 104/76 (11/19 0436) Pulse Rate: 79 (11/19 0436)  Labs: Recent Labs    01/31/20 0348 01/31/20 0348 02/01/20 0351 02/02/20 0043  HGB 8.5*   < > 10.3* 9.8*  HCT 25.0*  --  30.8* 29.5*  PLT 270  --  301 300  CREATININE 1.97*  --   --   --    < > = values in this interval not displayed.    Estimated Creatinine Clearance: 26.9 mL/min (A) (by C-G formula based on SCr of 1.97 mg/dL (H)).   Medical History: Past Medical History:  Diagnosis Date  . Adenomatous polyp of colon 06/2010   4 polyps removed at colonoscopy by Dr Henrene Pastor  . Anxiety   . Chronic headaches   . Depression   . Diverticulosis of colon 08/2010  . Internal hemorrhoids 08/2010  . Prostate cancer [facility])    receiving radiation treatment  . Subdural hematoma, chronic ([place])   . Tuberculosis    Assessment: 74 yo M admitted with GIB now stable. Pharmacy consulted to start enoxaparin for DVT prophylaxis.Discussed patient's CKD and low body weight with MD, agreed with change to subcutanous heparin.   Goal of Therapy:  Monitor platelets by anticoagulation protocol: Yes   Plan:  Start heparin 5000 units Q12 hr Monitor for signs/symptoms of bleeding   Benetta Spar, PharmD, BCPS, BCCP Clinical Pharmacist  Please check AMION for all [place] phone numbers After 10:00 PM, call [place] [phone]

## 2020-02-02 NOTE — Progress Notes (Signed)
Spoke with daughter, daughter stated dad told her his wallet was missing. Searched the entire room with a other nurse and case manager. Called security nothing was turned in.

## 2020-02-02 NOTE — Discharge Summary (Addendum)
[facility] Discharge Summary  Patient name: [name] Medical record number: [id number] Date of birth: 09-Dec-1945 Age: 74 y.o. Gender: male Date of Admission: 01/26/2020  Date of Discharge: 02/02/2020 Admitting Physician: Wilber Oliphant, MD  Primary Care Provider: Matilde Haymaker, MD Consultants: GI  Indication for Hospitalization: Abdominal Pain  Discharge Diagnoses/Problem List:  Abdominal Pain and Constipation Dementia  Frequent falls Chronic subdural hematoma CKD stage 3 Oropharyngeal dysphagia  Disposition: Home with Home Healh serv  Discharge Condition: Stable  Discharge Exam: General: Not in acute stress.sittingcomfortably and eating his breakfast. Cardiovascular: S1 S2 normal, No murmurs Respiratory: Normal breathing sounds Abdomen: Soft,Non tender Extremities: Edema improved since admission. Minimal edema present now  Brief Hospital Course:   Abdominal Pain Constipation Pt presented with abdominal pain x3 days. In the ED patient's vitals were stable. His abdomen is firm on exam and mildly tender in the LUQ. He also had B/L lower extremity edema.  Rectal exam demonstrated extremely hard stool, FOBT+. CBC remarkable for Hgb 9.5 with MCV 102.2 but otherwise normal. CMP showed Cr 1.73, albumin 3.3, AST 50, and lipase elevated at 108. CT abdomen pelvis obtained in the ED showed asymmetric thickening of internal oblique muscle on left and an 33mm enhancing area on lateral left kidney unchanged from prior. Endoscopy was done shows moderate sliding hiatal hernia. Colonoscopy was done showed A 3 mm polyp was removed from sigmoid colon. Multiple diverticula were found in the sigmoid colon and right colon. Several angioectasias with moderate but definite bleeding were found in the rectum. Coagulation for hemostasis using argon beam was successful. Surgical pathology shows Tubular adenoma and Negative for high-grade dysplasia or malignancy  His vitals remain  stable during his course. Swallow study was done and recommended Diet as chopped solids and nectar thick liquids and giving oral medications with puree.  Pt was started on Iron supplements. Hb is at discharge is stable at 9.8.   Lower Extremity Edema Pt has B/L lower extremity edema at admission. He was given Lasix 40mg  IV x1 in the ED  Bilateral lower extremity ultrasounds were negative for DVT. BNP 28.5 Echocardiogram was  essentially normal. His creatinine at discharge is 1.97 improved from 2.08 at admission. Pt LEE improved during his course.  Fall  Hx of Recurrent falls Chronic subdural hematoma Per chart review, patient with recurrent falls dating back to 2019. Also has chronic subdural hematoma that neurosurgery deemed to be nonsurgical. CT Head was done which showed stable small chronic left frontal subdural hemorrhage. No new bleed. Mild age-related atrophy and chronic microvascular ischemic changes.     CKD III  His creatinine at discharge is 1.97 improved from 2.08 at admission with fluids.   Issues for Follow Up:  Pt is being discharged to home with home health care as he needs 24 hours care for his dementia, dysphagia and chronic medical problems.   GI Follow up recs 1.Repeat colonoscopy in 5 years for surveillance 2 Iron supplementation as recommended indefinitely to reduce the risk of iron deficiency anemia associated with intermittent chronic blood loss from radiation proctitis. 3.Periodic blood counts via PCP 4 Follow up with GI and PCP.    Significant Procedures: Endoscopy and Colonoscopy  Endoscopy was done shows moderate sliding hiatal hernia.  Colonoscopy was done showed A 3 mm polyp was removed from sigmoid colon. Multiple diverticula were found in the sigmoid colon and right colon. Several angioectasias with moderate but definite bleeding were found in the rectum. Coagulation for hemostasis using  argon beam was successful.   SLP Diet Recommendations Dysphagia 2 (Fine  chop) solids;Nectar thick liquid  Liquid Administration via Cup;Straw  Medication Administration Whole meds with puree  Compensations Minimize environmental distractions;Slow rate  Postural Changes Seated upright at 90 degrees;Remain semi-upright after after feeds/meals (Comment)    Significant Labs and Imaging:  Recent Labs  Lab 01/31/20 0348 02/01/20 0351 02/02/20 0043  WBC 4.2 6.2 5.9  HGB 8.5* 10.3* 9.8*  HCT 25.0* 30.8* 29.5*  PLT 270 301 300   Recent Labs  Lab 01/27/20 0443 01/27/20 0443 01/28/20 0124 01/28/20 0124 01/29/20 1359 01/29/20 1359 01/30/20 0842 01/31/20 0348  NA 137  --  139  --  140  --  138 139  K 4.7   < > 5.0   < > 4.1   < > 4.7 4.5  CL 106  --  106  --  107  --  109 110  CO2 23  --  24  --  23  --  23 23  GLUCOSE 85  --  88  --  115*  --  116* 96  BUN 35*  --  30*  --  13  --  13 15  CREATININE 2.08*  --  2.04*  --  1.80*  --  2.06* 1.97*  CALCIUM 9.0  --  8.7*  --  8.6*  --  8.6* 8.6*  ALKPHOS 83  --   --   --   --   --   --  61  AST 47*  --   --   --   --   --   --  37  ALT 38  --   --   --   --   --   --  30  ALBUMIN 3.3*  --   --   --   --   --   --  2.7*   < > = values in this interval not displayed.   Pathology from colonoscopy-tubular adenoma which was negative for high-grade dysplasia or malignancy   Results/Tests Pending at Time of Discharge: None  Discharge Medications:  Allergies as of 02/02/2020   No Known Allergies     Medication List    STOP taking these medications   amoxicillin 500 MG capsule Commonly known as: AMOXIL   azithromycin 250 MG tablet Commonly known as: ZITHROMAX     TAKE these medications   ferrous sulfate 325 (65 FE) MG tablet Take 1 tablet (325 mg total) by mouth every other day. Start taking on: February 03, 2020   guaiFENesin 600 MG 12 hr tablet Commonly known as: Mucinex Take 1 tablet (600 mg total) by mouth 2 (two) times daily.   pantoprazole 40 MG tablet Commonly known as: PROTONIX Take  1 tablet (40 mg total) by mouth daily. Start taking on: February 03, 2020 What changed: when to take this   polyethylene glycol 17 g packet Commonly known as: MIRALAX / GLYCOLAX Take 17 g by mouth 2 (two) times daily.   Resource ThickenUp Clear Powd Take 120 g by mouth as needed.   senna 8.6 MG Tabs tablet Commonly known as: SENOKOT Take 1 tablet (8.6 mg total) by mouth daily. Start taking on: February 03, 2020            Durable Medical Equipment  (From admission, onward)         Start     Ordered   02/01/20 1533  For home use only DME lightweight  manual wheelchair with seat cushion  Once       Comments: Patient suffers from weakness which impairs their ability to perform daily activities like bathing or dressing in the home.  A walker or cane will not resolve  issue with performing activities of daily living. A wheelchair will allow patient to safely perform daily activities. Patient is not able to propel themselves in the home using a standard weight wheelchair due to weakness. Patient can self propel in the lightweight wheelchair. Length of need lifetime. Accessories: elevating leg rests (ELRs), wheel locks, extensions and anti-tippers.   02/01/20 1533   02/01/20 1532  For home use only DME Shower stool  Once        02/01/20 1532          Discharge Instructions: Please refer to Patient Instructions section of EMR for full details.  Patient was counseled important signs and symptoms that should prompt return to medical care, changes in medications, dietary instructions, activity restrictions, and follow up appointments.   Follow-Up Appointments:  Follow-up Information    [facility] Follow up.   Why: lightweight wheelchair, shower stool Contact information: [place] [address] [phone]        [facility] Follow up.   Specialty: Home Health Services Why: HHPT, HHOT Contact information: [place]  [place] [address] [phone]               Armando Reichert, MD 02/02/2020, 2:35 PM PGY-1, [place] Upper-Level Resident Addendum   I have independently interviewed and examined the patient. I have discussed the above with the original author and agree with their documentation. Please see also any attending notes.   Gifford Shave, MD PGY-2, [facility] 02/02/2020 3:10 PM  FPTS Service pager: [phone] (text pages welcome through [place])

## 2020-02-02 NOTE — Discharge Instructions (Signed)
Dear [name],   Thank you for letting us participate in your care! In this section, you will find a brief hospital admission summary of why you were admitted to the hospital, what happened during your admission, your diagnosis/diagnoses, and recommended follow up.   You were admitted because you were experiencing Abdominal pain and constipation and anemia..   You were diagnosed with GI Bleed.   You were treated with Miralax, pantoprazole and iron supplements .   You were also seen by GI. They recommended Endoscopy and Colonoscopy.   Your symptoms improved and you were discharged from the hospital for meeting this goal.    POST-HOSPITAL & CARE INSTRUCTIONS 1. Repeat colonoscopy in 5 years for surveillance 2. Take Iron supplementation indefinitely to reduce the risk of iron deficiency anemia associated with intermittent chronic blood loss from radiation proctitis 3. Periodic blood counts via PCP 4.  Follow up with GI and PCP.  5. Please let PCP/Specialists know of any changes that were made. 6. Please see medications section of this packet for any medication changes.   DOCTOR'S APPOINTMENT & FOLLOW UP CARE INSTRUCTIONS  No future appointments.   Thank you for choosing [facility]! Take care and be well!  [facility]  [place], [place] [address] [phone]

## 2020-02-02 NOTE — Progress Notes (Signed)
Occupational Therapy Treatment Patient Details Name: [name] MRN: [id number] DOB: [birth year] Today's Date: 02/02/2020    History of present illness 74 Y/O M with Subdural Hematoma, Prostate Cancer, Colonic adenomatous polyps, presented with abdominal pain that started a few days ago. He denies N/V, his last BM was yesterday, and he endorsed seeing small blood in it. He also endorsed nasal bleed yesterday but none today.   OT comments  Patient pointing to bedside commode upon entering.  OT assisted patient with Min A for bed mobility, Min hand held assist for transfers and assist for thoroughness with respect to hygiene.  Nursing Tech in the room and notified of BM. Patient has a history of falls per the daughter, she is requesting a wheelchair.  Unclear how compliant he would be using a wheelchair.  OT will continue to follow in the acute setting, next visit OT will attempt sink side ADL.    Follow Up Recommendations  Home health OT    Equipment Recommendations  Tub/shower seat .  Daughter is interested in a wheelchair for him given increasing falls.  18x18" wheelchair with a 2" foam gel cushion.     Recommendations for Other Services      Precautions / Restrictions Precautions Precautions: Fall       Mobility Bed Mobility Overal bed mobility: Needs Assistance Bed Mobility: Supine to Sit;Sit to Supine     Supine to sit: Min assist Sit to supine: Min assist      Transfers   Equipment used: None Transfers: Sit to/from Stand Sit to Stand: Min assist Stand pivot transfers: Min assist            Balance           Standing balance support: During functional activity;Single extremity supported Standing balance-[name] Scale: Poor                             ADL either performed or assessed with clinical judgement   ADL       Grooming: Wash/dry face;Supervision/safety;Sitting;Wash/dry hands                   Toilet Transfer: Minimal  assistance;Stand-pivot;RW Armed forces technical officer Details (indicate cue type and reason): HHA Toileting- Clothing Manipulation and Hygiene: Minimal assistance Toileting - Clothing Manipulation Details (indicate cue type and reason): Min Guard initially, but assist provided for thoroughness                                                                                                 Pertinent Vitals/ Pain       Faces Pain Scale: No hurt Pain Intervention(s): Monitored during session                                                          Frequency  Min 2X/week        Progress Toward  Goals  OT Goals(current goals can now be found in the care plan section)  Progress towards OT goals: Progressing toward goals  Acute Rehab OT Goals OT Goal Formulation: Patient unable to participate in goal setting Time For Goal Achievement: 02/10/20 Potential to Achieve Goals: [place] Discharge plan remains appropriate;Frequency remains appropriate    Co-evaluation                 AM-PAC OT "6 Clicks" Daily Activity     Outcome Measure   Help from another person eating meals?: None Help from another person taking care of personal grooming?: A Little Help from another person toileting, which includes using toliet, bedpan, or urinal?: A Little Help from another person bathing (including washing, rinsing, drying)?: A Lot Help from another person to put on and taking off regular upper body clothing?: None Help from another person to put on and taking off regular lower body clothing?: A Little 6 Click Score: 19    End of Session    OT Visit Diagnosis: Unsteadiness on feet (R26.81);Muscle weakness (generalized) (M62.81);History of falling (Z91.81)   Activity Tolerance Patient tolerated treatment well   Patient Left in bed;with call bell/phone within reach;with bed alarm set   Nurse Communication          Time:  5852-7782 OT Time Calculation (min): 17 min  Charges: OT General Charges $OT Visit: 1 Visit OT Treatments $Self Care/Home Management : 8-22 mins  02/02/2020  [name], OTR/L  Acute Rehabilitation Services  Office:  [phone]    [name] 02/02/2020, 2:42 PM

## 2020-02-02 NOTE — Progress Notes (Addendum)
Family Medicine Teaching Service Daily Progress Note Intern Pager: [phone]  Patient name: [name] Medical record number: [id number] Date of birth: [birth year] Age: 74 y.o. Gender: male  Primary Care Provider: Matilde Haymaker, MD Consultants:GI Code Status: Full  Pt Overview and Major Events to Date: Admitted 11/12 for abdominal pain  Assessment and Plan: [name] a 74 y.o.malepresenting with lower extremity edema and abdominal pain. PMH is significant fordementia, frequent falls, chronic subdural hematoma, CKD stage 3, and oropharyngeal dysphagia. Of note, history is extremely limited as patient is vietnamese speaking only and interpreters are unable to understand patient, as he is edentulous. Pt medically stable pending safe discharge to Home health.  Abdominal pain with constipation This morning Pt is sitting comfortably and eating his breakfast by himself without any problem. Pt denies any complaints. Denies any constipation, diarrhea, abdominal pain, nausea and vomitting. Interpreter couldn't what he was saying but stated that he wanted to know about his paperwork.  Pt had Endoscopy and Colonoscopy on 11/15 Endoscopy shows moderate sliding hiatal hernia. Colonoscopy shows A 3 mm polyp was removed from sigmoid colon. Multiple diverticula were found in the sigmoid colon and right colon.  Bleeding was found in the rectum and Coagulation done. His vitals are stable today. Hb isimproving 9.8(10.3 yesterday and 9.7 at admission).MCV102.4.Vitals stable. Afebrile. -GI consulted. Appreciate recommendations. -Up with assistance.  -Vitals per routine --Chopped solidssmall bites, sips with strawand Meds with Puree. - Protonix 40 mg twice daily.  Lower extremity edema B/L Edema improved since admission. Very minimal edema present now. Net Intake of -1161. His Last creatinine was 1.97 on 11/17  Echocardiogramwas essentially normal -Continue to monitor lower extremity  edema -Consideradditional diuresis if Edema increases. - Monitor I/O  History of recurrent falls with recent fall PT, OT eval donerecommended SNF placement. OT recommended DME manual wheelchair and dME shower stool which were ordered. -Out of bed with assistance --Disposition to Home health or SNF.   Home situation with history of dementia and language barrier Patient requires 24-hour care but does not have a clear that that is occurring at home.Transition of care call was placed yesterday for home OT and PT.  NCM spoke with patient's daughter, offered choice, she states she does not have a preference. Referrals made [facility] and [facility]. -Pending safe discharge planning.   Anemia, macrocytic Hb isstable at  9.8with MCV of102.4.Vitamin B12 and Folate levels normal. -Monitor CBC -Continue PPI twice daily -ContinueFerrous sulphate 325 every other day.  CKD III  GFR 40's at baseline. His last creatinine was1.97.Creatinine 1.5 at baseline. No further diuresis at this time.  -Monitor BMP. -Avoid other neprhotoxic meds.  FEN/GI:Pt is onregular diet. Small bites and sips with straw. SAY:[id number]  Disposition: Med Surg pending safe discharge. HH or SNF   Subjective:  Pt is sitting comfortably and eating his breakfast by himself without any problem. Pt denies any complaints. Denies any constipation, diarrhea, abdominal pain, nausea and vomitting. Interpreter couldn't what he was saying but stated that he wanted to know about his paperwork.   Objective: Temp:  [97.8 F (36.6 C)-98.6 F (37 C)] 97.8 F (36.6 C) (11/19 0436) Pulse Rate:  [79-88] 79 (11/19 0436) Resp:  [16-18] 16 (11/19 0436) BP: (100-104)/(68-76) 104/76 (11/19 0436) SpO2:  [98 %-99 %] 99 % (11/19 0436) Weight:  [56.1 kg] 56.1 kg (11/19 0400) Physical Exam: General: Not in acute stress.sitting comfortably and eating his breakfast. Cardiovascular: S1 S2 normal, No murmurs Respiratory: Normal  breathing sounds Abdomen: Soft, Non tender Extremities:  Edema improved since admission. Minimal edema present now.  Laboratory: Recent Labs  Lab 01/31/20 0348 02/01/20 0351 02/02/20 0043  WBC 4.2 6.2 5.9  HGB 8.5* 10.3* 9.8*  HCT 25.0* 30.8* 29.5*  PLT 270 301 300   Recent Labs  Lab 01/26/20 1017 01/26/20 1017 01/27/20 0443 01/28/20 0124 01/29/20 1359 01/30/20 0842 01/31/20 0348  NA 137   < > 137   < > 140 138 139  K 4.7   < > 4.7   < > 4.1 4.7 4.5  CL 108   < > 106   < > 107 109 110  CO2 21*   < > 23   < > 23 23 23   BUN 35*   < > 35*   < > 13 13 15   CREATININE 1.73*   < > 2.08*   < > 1.80* 2.06* 1.97*  CALCIUM 8.8*   < > 9.0   < > 8.6* 8.6* 8.6*  PROT 6.5  --  6.6  --   --   --  5.5*  BILITOT 0.8  --  1.3*  --   --   --  0.3  ALKPHOS 78  --  83  --   --   --  61  ALT 39  --  38  --   --   --  30  AST 50*  --  47*  --   --   --  37  GLUCOSE 92   < > 85   < > 115* 116* 96   < > = values in this interval not displayed.      Imaging/Diagnostic Tests: No new results.  Armando Reichert, MD 02/02/2020, 5:42 AM PGY-1, [place] Intern pager: [phone], text pages welcome

## 2020-02-06 ENCOUNTER — Ambulatory Visit: Payer: Medicare Other | Admitting: Family Medicine

## 2020-02-06 ENCOUNTER — Telehealth: Payer: Self-pay

## 2020-02-06 NOTE — Telephone Encounter (Signed)
Benn Moulder, [facility] PT with [facility], calls nurse line requesting verbal orders for the following:  [place] PT  [place] OT [facility] Work  Verbal orders given to [employer] per [facility] protocol.

## 2020-02-23 ENCOUNTER — Telehealth: Payer: Self-pay

## 2020-02-23 NOTE — Telephone Encounter (Signed)
[name] with [facility] calls nurse line reporting discharge from their services. [name] reports his current living situation is not ideal for home health. [name] reports his daughter is unable to care for him and is trying to get him placed in a long term care facility. [name] just wants [name] aware he is no longer receiving home health from them.

## 2021-07-03 ENCOUNTER — Emergency Department (HOSPITAL_COMMUNITY)
Admission: EM | Admit: 2021-07-03 | Discharge: 2021-07-04 | Disposition: A | Discharge status: Home / Self Care | Payer: Medicare Other | Attending: Emergency Medicine | Admitting: Emergency Medicine

## 2021-07-03 ENCOUNTER — Emergency Department (HOSPITAL_COMMUNITY): Payer: Medicare Other

## 2021-07-03 ENCOUNTER — Other Ambulatory Visit: Payer: Self-pay

## 2021-07-03 DIAGNOSIS — F0390 Unspecified dementia without behavioral disturbance: Secondary | ICD-10-CM | POA: Diagnosis not present

## 2021-07-03 DIAGNOSIS — Z760 Encounter for issue of repeat prescription: Secondary | ICD-10-CM | POA: Diagnosis not present

## 2021-07-03 DIAGNOSIS — R0602 Shortness of breath: Secondary | ICD-10-CM | POA: Insufficient documentation

## 2021-07-03 DIAGNOSIS — R109 Unspecified abdominal pain: Secondary | ICD-10-CM | POA: Diagnosis not present

## 2021-07-03 DIAGNOSIS — R4182 Altered mental status, unspecified: Secondary | ICD-10-CM | POA: Insufficient documentation

## 2021-07-03 DIAGNOSIS — N390 Urinary tract infection, site not specified: Secondary | ICD-10-CM | POA: Insufficient documentation

## 2021-07-03 NOTE — ED Provider Notes (Signed)
?[place] ?Provider Note ? ? ?CSN: [id number] ?Arrival date & time: 07/03/21  2245 ? ?  ? ?History ? ?Chief Complaint  ?Patient presents with  ? Shortness of Breath  ? ? ?[name] is a 76 y.o. male. ? ?The history is provided by a relative and medical records.  ?Shortness of Breath ?[name] is a 76 y.o. male who presents to the Emergency Department complaining of shortness of breath.  He presents to the emergency department by EMS for evaluation of shortness of breath.  His daughter provides all the history as patient is nonverbal.  She states that he was just discharged from [facility] on April 11 to home.  She states that he recently had a PEG tube placed on April 5 and over the last few days seems like he is having some pain at the PEG tube site with tube feeds.  He also has been complaining of shortness of breath.  She suspects that it might be due to dry mouth as he is taking all his nourishment through the PEG tube.  He is also complaining of dysuria. ?  ? ?Home Medications ?Prior to Admission medications   ?Medication Sig Start Date End Date Taking? Authorizing Provider  ?albuterol (VENTOLIN HFA) 108 (90 Base) MCG/ACT inhaler Inhale 2 puffs into the lungs every 4 (four) hours as needed for wheezing or shortness of breath. 07/04/21  Yes Quintella Reichert, MD  ?baclofen (LIORESAL) 10 MG tablet Place 0.5 tablets (5 mg total) into feeding tube 3 (three) times daily. 07/04/21  Yes Quintella Reichert, MD  ?bisacodyl (DULCOLAX) 10 MG suppository Place 1 suppository (10 mg total) rectally daily as needed for moderate constipation. 07/04/21  Yes Quintella Reichert, MD  ?cephALEXin (KEFLEX) 500 MG capsule Place 1 capsule (500 mg total) into feeding tube 2 (two) times daily. 07/04/21  Yes Quintella Reichert, MD  ?famotidine (PEPCID) 20 MG tablet Place 1 tablet (20 mg total) into feeding tube 2 (two) times daily. 07/04/21  Yes Quintella Reichert, MD  ?guaiFENesin  (ROBITUSSIN) 100 MG/5ML liquid Place 5-10 mLs (100-200 mg total) into feeding tube every 4 (four) hours as needed for cough or to loosen phlegm. 07/04/21  Yes Quintella Reichert, MD  ?midodrine (PROAMATINE) 2.5 MG tablet Place 1 tablet (2.5 mg total) into feeding tube 3 (three) times daily with meals. 07/04/21  Yes Quintella Reichert, MD  ?ferrous sulfate 325 (65 FE) MG tablet Take 1 tablet (325 mg total) by mouth every other day. 02/03/20   Armando Reichert, MD  ?guaiFENesin (MUCINEX) 600 MG 12 hr tablet Take 1 tablet (600 mg total) by mouth 2 (two) times daily. ?Patient not taking: Reported on 01/26/2020 10/21/17   Couture, [employer], PA-C  ?Maltodextrin-Xanthan Gum (RESOURCE THICKENUP CLEAR) POWD Take 120 g by mouth as needed. 02/02/20   Armando Reichert, MD  ?pantoprazole (PROTONIX) 40 MG tablet Take 1 tablet (40 mg total) by mouth daily. 02/03/20   Armando Reichert, MD  ?polyethylene glycol (MIRALAX / GLYCOLAX) 17 g packet Take 17 g by mouth 2 (two) times daily. 02/02/20   Armando Reichert, MD  ?senna (SENOKOT) 8.6 MG TABS tablet Take 1 tablet (8.6 mg total) by mouth daily. 02/03/20   Armando Reichert, MD  ?   ? ?Allergies    ?Patient has no known allergies.   ? ?Review of Systems   ?Review of Systems  ?Respiratory:  Positive for shortness of breath.   ?All other systems reviewed and are negative. ? ?  Physical Exam ?Updated Vital Signs ?BP 114/75   Pulse 88   Temp 98.8 ?F (37.1 ?C) (Oral)   Resp 18   Ht '5\' 6"'$  (1.676 m)   Wt 54.4 kg   SpO2 99%   BMI 19.37 kg/m?  ?Physical Exam ?Vitals and nursing note reviewed.  ?Constitutional:   ?   Appearance: He is well-developed.  ?   Comments: Chronically ill-appearing  ?HENT:  ?   Head: Normocephalic and atraumatic.  ?   Comments: Dry mucous membranes ?Cardiovascular:  ?   Rate and Rhythm: Normal rate and regular rhythm.  ?   Heart sounds: No murmur heard. ?Pulmonary:  ?   Effort: Pulmonary effort is normal. No respiratory distress.  ?   Breath sounds: Normal breath sounds.  ?Abdominal:  ?    Palpations: Abdomen is soft.  ?   Tenderness: There is no abdominal tenderness. There is no guarding or rebound.  ?   Comments: Gastrostomy tube in the left upper quadrant without surrounding erythema.  No significant tenderness  ?Musculoskeletal:     ?   General: No tenderness.  ?Skin: ?   General: Skin is warm and dry.  ?Neurological:  ?   Mental Status: He is alert.  ?   Comments: Nonverbal.  Weakly moves bilateral upper extremities.  ?Psychiatric:     ?   Behavior: Behavior normal.  ? ? ?ED Results / Procedures / Treatments   ?Labs ?(all labs ordered are listed, but only abnormal results are displayed) ?Labs Reviewed  ?COMPREHENSIVE METABOLIC PANEL - Abnormal; Notable for the following components:  ?    Result Value  ? Glucose, Bld 101 (*)   ? BUN 33 (*)   ? Albumin 3.1 (*)   ? All other components within normal limits  ?CBC WITH DIFFERENTIAL/PLATELET - Abnormal; Notable for the following components:  ? RBC 3.53 (*)   ? Hemoglobin 11.9 (*)   ? HCT 35.7 (*)   ? MCV 101.1 (*)   ? Platelets 491 (*)   ? Abs Immature Granulocytes 0.11 (*)   ? All other components within normal limits  ?URINALYSIS, ROUTINE W REFLEX MICROSCOPIC - Abnormal; Notable for the following components:  ? Color, Urine AMBER (*)   ? APPearance CLOUDY (*)   ? Leukocytes,Ua LARGE (*)   ? Bacteria, UA MANY (*)   ? All other components within normal limits  ?URINE CULTURE  ?BRAIN NATRIURETIC PEPTIDE  ?TROPONIN I (HIGH SENSITIVITY)  ?TROPONIN I (HIGH SENSITIVITY)  ? ? ?EKG ?None ? ?Radiology ?CT Head Wo Contrast ? ?Result Date: 07/04/2021 ?CLINICAL DATA:  Altered mental status. EXAM: CT HEAD WITHOUT CONTRAST TECHNIQUE: Contiguous axial images were obtained from the base of the skull through the vertex without intravenous contrast. RADIATION DOSE REDUCTION: This exam was performed according to the departmental dose-optimization program which includes automated exposure control, adjustment of the mA and/or kV according to patient size and/or use of  iterative reconstruction technique. COMPARISON:  Head CT dated 02/18/2021. FINDINGS: Brain: Moderate age-related atrophy and chronic microvascular ischemic changes. There is no acute intracranial hemorrhage. No mass effect or midline shift. No extra-axial fluid collection. Vascular: No hyperdense vessel or unexpected calcification. Skull: Normal. Negative for fracture or focal lesion. Sinuses/Orbits: No acute finding. Other: None IMPRESSION: 1. No acute intracranial pathology. 2. Moderate age-related atrophy and chronic microvascular ischemic changes. Electronically Signed   By: Anner Crete M.D.   On: 07/04/2021 03:04  ? ?CT Angio Chest PE W/Cm &/Or Wo Cm ? ?Result  Date: 07/04/2021 ?CLINICAL DATA:  Pulmonary embolism suspected.  Acute abdominal pain. EXAM: CT ANGIOGRAPHY CHEST CT ABDOMEN AND PELVIS WITH CONTRAST TECHNIQUE: Multidetector CT imaging of the chest was performed using the standard protocol during bolus administration of intravenous contrast. Multiplanar CT image reconstructions and MIPs were obtained to evaluate the vascular anatomy. Multidetector CT imaging of the abdomen and pelvis was performed using the standard protocol during bolus administration of intravenous contrast. RADIATION DOSE REDUCTION: This exam was performed according to the departmental dose-optimization program which includes automated exposure control, adjustment of the mA and/or kV according to patient size and/or use of iterative reconstruction technique. CONTRAST:  98m OMNIPAQUE IOHEXOL 350 MG/ML SOLN COMPARISON:  None. FINDINGS: CTA CHEST FINDINGS Cardiovascular: Contrast injection is sufficient to demonstrate satisfactory opacification of the pulmonary arteries to the segmental level. There is no pulmonary embolus. The main pulmonary artery is within normal limits for size. There is no CT evidence of acute right heart strain. There is calcific aortic atherosclerosis. There is a normal 3-vessel arch branching pattern. Heart  size is normal, without pericardial effusion. Mediastinum/Nodes: No mediastinal, hilar or axillary lymphadenopathy. The visualized thyroid and thoracic esophageal course are unremarkable. Lungs/Pleura: 5 mm ground-glass

## 2021-07-03 NOTE — ED Triage Notes (Signed)
Pt from home via [facility] for eval of what pt's daughter reports as " SHOB." Per EMS, pt O2 sats in upper 90's, no retractions, no visible distress. Per daughter, pt had feeding tube placed approx 4 days ago, pt was "pushed out of facility r/t insurance," pt has had zero reported respiratory issues w EMS. ? ?VSS, NAD en route ?Vietnamese speaking ?

## 2021-07-04 ENCOUNTER — Emergency Department (HOSPITAL_COMMUNITY): Payer: Medicare Other

## 2021-07-04 DIAGNOSIS — R0602 Shortness of breath: Secondary | ICD-10-CM | POA: Diagnosis not present

## 2021-07-04 LAB — COMPREHENSIVE METABOLIC PANEL
ALT: 26 U/L (ref 0–44)
AST: 29 U/L (ref 15–41)
Albumin: 3.1 g/dL — ABNORMAL LOW (ref 3.5–5.0)
Alkaline Phosphatase: 72 U/L (ref 38–126)
Anion gap: 7 (ref 5–15)
BUN: 33 mg/dL — ABNORMAL HIGH (ref 8–23)
CO2: 24 mmol/L (ref 22–32)
Calcium: 8.9 mg/dL (ref 8.9–10.3)
Chloride: 105 mmol/L (ref 98–111)
Creatinine, Ser: 1.02 mg/dL (ref 0.61–1.24)
GFR, Estimated: >60 mL/min (ref >60)
Glucose, Bld: 101 mg/dL — ABNORMAL HIGH (ref 70–99)
Potassium: 5 mmol/L (ref 3.5–5.1)
Sodium: 136 mmol/L (ref 135–145)
Total Bilirubin: 0.3 mg/dL (ref 0.3–1.2)
Total Protein: 6.5 g/dL (ref 6.5–8.1)

## 2021-07-04 LAB — URINALYSIS, ROUTINE W REFLEX MICROSCOPIC
Bilirubin Urine: NEGATIVE
Glucose, UA: NEGATIVE mg/dL
Hgb urine dipstick: NEGATIVE
Ketones, ur: NEGATIVE mg/dL
Nitrite: NEGATIVE
Protein, ur: NEGATIVE mg/dL
Specific Gravity, Urine: 1.015 (ref 1.005–1.030)
pH: 8 (ref 5.0–8.0)

## 2021-07-04 LAB — CBC WITH DIFFERENTIAL/PLATELET
Abs Immature Granulocytes: 0.11 10*3/uL — ABNORMAL HIGH (ref 0.00–0.07)
Basophils Absolute: 0.1 10*3/uL (ref 0.0–0.1)
Basophils Relative: 1 %
Eosinophils Absolute: 0.2 10*3/uL (ref 0.0–0.5)
Eosinophils Relative: 3 %
HCT: 35.7 % — ABNORMAL LOW (ref 39.0–52.0)
Hemoglobin: 11.9 g/dL — ABNORMAL LOW (ref 13.0–17.0)
Immature Granulocytes: 2 %
Lymphocytes Relative: 19 %
Lymphs Abs: 1.4 10*3/uL (ref 0.7–4.0)
MCH: 33.7 pg (ref 26.0–34.0)
MCHC: 33.3 g/dL (ref 30.0–36.0)
MCV: 101.1 fL — ABNORMAL HIGH (ref 80.0–100.0)
Monocytes Absolute: 0.4 10*3/uL (ref 0.1–1.0)
Monocytes Relative: 6 %
Neutro Abs: 4.9 10*3/uL (ref 1.7–7.7)
Neutrophils Relative %: 69 %
Platelets: 491 10*3/uL — ABNORMAL HIGH (ref 150–400)
RBC: 3.53 MIL/uL — ABNORMAL LOW (ref 4.22–5.81)
RDW: 11.7 % (ref 11.5–15.5)
WBC: 7.1 10*3/uL (ref 4.0–10.5)
nRBC: 0 % (ref 0.0–0.2)

## 2021-07-04 LAB — TROPONIN I (HIGH SENSITIVITY)
Troponin I (High Sensitivity): 6 ng/L (ref <18)
Troponin I (High Sensitivity): 7 ng/L (ref <18)

## 2021-07-04 LAB — BRAIN NATRIURETIC PEPTIDE: B Natriuretic Peptide: 13.7 pg/mL (ref 0.0–100.0)

## 2021-07-04 MED ORDER — BISACODYL 10 MG RE SUPP: 10.0000 mg | Freq: Every day | RECTAL | 0 refills | Status: DC | PRN

## 2021-07-04 MED ORDER — IOHEXOL 350 MG/ML SOLN
80.0000 mL | Freq: Once | INTRAVENOUS | Status: AC | PRN
  Administered 2021-07-04: 80 mL via INTRAVENOUS

## 2021-07-04 MED ORDER — MIDODRINE HCL 2.5 MG PO TABS: 2.5000 mg | ORAL_TABLET | Freq: Three times a day (TID) | ORAL | 0 refills | Status: DC

## 2021-07-04 MED ORDER — BACLOFEN 10 MG PO TABS: 5.0000 mg | ORAL_TABLET | Freq: Three times a day (TID) | ORAL | 0 refills | Status: DC

## 2021-07-04 MED ORDER — CEPHALEXIN 500 MG PO CAPS: 500.0000 mg | ORAL_CAPSULE | Freq: Two times a day (BID) | ORAL | 0 refills | Status: DC

## 2021-07-04 MED ORDER — GUAIFENESIN 100 MG/5ML PO LIQD
100.0000 mg | ORAL | 0 refills | Status: DC | PRN
Start: 2021-07-04 — End: 2021-07-25

## 2021-07-04 MED ORDER — ALBUTEROL SULFATE HFA 108 (90 BASE) MCG/ACT IN AERS: 2.0000 | INHALATION_SPRAY | RESPIRATORY_TRACT | 0 refills | Status: DC | PRN

## 2021-07-04 MED ORDER — FAMOTIDINE 20 MG PO TABS: 20.0000 mg | ORAL_TABLET | Freq: Two times a day (BID) | ORAL | 0 refills | Status: DC

## 2021-07-04 MED ORDER — SODIUM CHLORIDE 0.9 % IV BOLUS
500.0000 mL | Freq: Once | INTRAVENOUS | Status: AC
  Administered 2021-07-04: 500 mL via INTRAVENOUS

## 2021-07-04 NOTE — ED Notes (Signed)
Patient daughter, Santa Lighter, contacted [phone]) and informed of discharge and DC instructions. [facility] contacted for transport arrangment.  ?

## 2021-07-04 NOTE — ED Notes (Signed)
Pt is sleeping, vitals are stable, report was called to Pt.'s sister per night shift RN. D/c instructions provided in vietnamese and given to son who is at the bedside.  ?

## 2021-07-04 NOTE — Progress Notes (Signed)
Transition of Care [facility]) - Emergency Department Mini Assessment ? ? ?Patient Details  ?Name: [name] ?MRN: [id number] ?Date of Birth: [birth year] ? ?Transition of Care (TOC) CM/SW Contact:    ?Fuller Mandril, RN ?Phone Number: ?07/04/2021, 8:12 AM ? ? ?Clinical Narrative: ?Pt currently active with [facility] services as confirmed by [facility] with Ramond Marrow of [facility].  Pt will resume [facility] services of RN/PT/SW.  ? ? ? ?ED Mini Assessment: ?What brought you to the Emergency Department? : (P) short of breath ? ?Barriers to Discharge: (P) No Barriers Identified ? ?  ? ?Means of departure: (P) Ambulance ? ?Interventions which prevented an admission or readmission: (P) [place] or Services ? ? ? ?Patient Contact and Communications ?  ?  ?Spoke with: (P) daughter ? ,     ?  ?  ? ?Patient states their goals for this hospitalization and ongoing recovery are:: (P) fix the breathing ?  ?  ? ?Admission diagnosis:  SHOB  ?Patient Active Problem List  ? Diagnosis Date Noted  ? Radiation proctitis   ? Benign neoplasm of sigmoid colon   ? Gastroesophageal reflux disease without esophagitis   ? Decreased hemoglobin   ? Iron deficiency anemia   ? Leg swelling 01/26/2020  ? Abdominal pain   ? Acute pancreatitis   ? Dysuria 11/18/2017  ? History of colonic polyps 10/01/2015  ? Moderate dementia without behavioral disturbance ([employer]) 03/22/2015  ? Right-sided low back pain with right-sided sciatica 11/30/2014  ? Neuropathy 11/30/2014  ? Vitamin D insufficiency 07/09/2014  ? Right-sided low back pain without sciatica 07/06/2014  ? Memory loss 06/24/2014  ? Anemia of chronic disease 05/15/2014  ? Internal hemorrhoids with complication 83/25/4982  ? Heme positive stool 05/09/2014  ? Degenerative disc disease, lumbar 05/06/2014  ? Foraminal stenosis of lumbar region 05/06/2014  ? Depression 12/13/2013  ? Prostate cancer ([place]) 12/13/2013  ? Rectal bleed 03/27/2012  ? HYPERCHOLESTEROLEMIA, MILD 05/19/2010  ? 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 Constipation 09/23/2009  ? 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 HEADACHE 09/23/2009  ? ?PCP:  Lurline Del, DO ?Pharmacy:   ?[address] ?[place] ?[place] [address] ?Phone: [phone] Fax: [phone] ? ?[id number] - [place], [place] - [address] ?[address] ?[place] [address] ?Phone: [phone] Fax: [phone] ? ?[facility] ?[address] ?[place] [address] ?Phone: [phone] Fax: [phone] ?  ?

## 2021-07-04 NOTE — ED Notes (Signed)
Care of patient assumed at this time. Patient is resting in bed with daughter and son at bedside. Patient is alert and resting with eyes open that follow nurse intermittently with movement. Patient does withdraw to pain. Patient does not offer any verbal response to nurse or patient's daughter at bedside but daughter reports patient is alert and oriented, speaks with her, and is at his baseline. Patient has PEG tube to upper abdomen with clean and dry dressing. Call bell in reach of patient daughter.  ?

## 2021-07-05 ENCOUNTER — Encounter (HOSPITAL_COMMUNITY): Payer: Self-pay

## 2021-07-05 ENCOUNTER — Other Ambulatory Visit: Payer: Self-pay

## 2021-07-05 ENCOUNTER — Emergency Department (HOSPITAL_COMMUNITY)
Admission: EM | Admit: 2021-07-05 | Discharge: 2021-07-06 | Disposition: A | Discharge status: Home / Self Care | Payer: Medicare Other | Attending: Emergency Medicine | Admitting: Emergency Medicine

## 2021-07-05 DIAGNOSIS — F0390 Unspecified dementia without behavioral disturbance: Secondary | ICD-10-CM | POA: Insufficient documentation

## 2021-07-05 DIAGNOSIS — Z8546 Personal history of malignant neoplasm of prostate: Secondary | ICD-10-CM | POA: Insufficient documentation

## 2021-07-05 DIAGNOSIS — Z978 Presence of other specified devices: Secondary | ICD-10-CM

## 2021-07-05 DIAGNOSIS — Z87891 Personal history of nicotine dependence: Secondary | ICD-10-CM | POA: Diagnosis not present

## 2021-07-05 MED ORDER — OSMOLITE 1.5 CAL PO LIQD
237.0000 mL | ORAL | Status: DC
  Administered 2021-07-06: 237 mL
  Filled 2021-07-05 (×10): qty 237

## 2021-07-05 NOTE — ED Triage Notes (Addendum)
Pt bib [facility] from home after family called out requesting tube feeding d/t being out of feeding at home. Pt typically gets feeding Q4H and last feeding was at 12am. Pt nonverbal. ?

## 2021-07-05 NOTE — ED Provider Notes (Signed)
?[place] DEPT ?[facility] Emergency Department ?Provider Note ?MRN:  [id number]  ?Arrival date & time: 07/05/21    ? ?Chief Complaint   ?tube feeding request ?  ?History of Present Illness   ?[name] is a 76 y.o. year-old male with a history of dementia, dysphagia presenting to the ED with chief complaint of tube feeding request. ? ?History obtained from patient's family.  There is an issue with home health services, they ran out of tube feeds at home and they were told they will not get anymore until Monday.  He has not eaten for 12 hours and if they stay home he will not eat for another 30 hours.  They are concerned about this.  Home health advised him to come to the emergency department.  No other complaints. ? ?Review of Systems  ?A thorough review of systems was obtained and all systems are negative except as noted in the HPI and PMH.  ? ?Patient's Health History   ? ?Past Medical History:  ?Diagnosis Date  ? Adenomatous polyp of colon 06/2010  ? 4 polyps removed at colonoscopy by Dr Henrene Pastor  ? Anxiety   ? Chronic headaches   ? Depression   ? Diverticulosis of colon 08/2010  ? Internal hemorrhoids 08/2010  ? Prostate cancer ([place])   ? receiving radiation treatment  ? Subdural hematoma, chronic (HCC)   ? Tuberculosis   ?  ?Past Surgical History:  ?Procedure Laterality Date  ? BIOPSY  01/29/2020  ? Procedure: BIOPSY;  Surgeon: Irene Shipper, MD;  Location: [facility] ENDOSCOPY;  Service: Endoscopy;;  ? COLONOSCOPY WITH PROPOFOL N/A 01/29/2020  ? Procedure: COLONOSCOPY WITH PROPOFOL;  Surgeon: Irene Shipper, MD;  Location: [facility] ENDOSCOPY;  Service: Endoscopy;  Laterality: N/A;  ? ESOPHAGOGASTRODUODENOSCOPY (EGD) WITH PROPOFOL N/A 01/29/2020  ? Procedure: ESOPHAGOGASTRODUODENOSCOPY (EGD) WITH PROPOFOL;  Surgeon: Irene Shipper, MD;  Location: [facility] ENDOSCOPY;  Service: Endoscopy;  Laterality: N/A;  ? FLEXIBLE SIGMOIDOSCOPY  03/29/2012  ? Procedure: FLEXIBLE SIGMOIDOSCOPY;  Surgeon: Inda Castle, MD;  Location: [place];  Service: Endoscopy;  Laterality: N/A;  possibly may do colonoscopy but prep is only for a flex  ? FLEXIBLE SIGMOIDOSCOPY  03/31/2012  ? Procedure: FLEXIBLE SIGMOIDOSCOPY;  Surgeon: Inda Castle, MD;  Location: [place];  Service: Endoscopy;  Laterality: N/A;  ? HEMORRHOID BANDING  03/31/2012  ? Procedure: HEMORRHOID BANDING;  Surgeon: Inda Castle, MD;  Location: [place];  Service: Endoscopy;  Laterality: N/A;  ? HOT HEMOSTASIS N/A 01/29/2020  ? Procedure: HOT HEMOSTASIS (ARGON PLASMA COAGULATION/BICAP);  Surgeon: Irene Shipper, MD;  Location: [facility] ENDOSCOPY;  Service: Endoscopy;  Laterality: N/A;  ? MULTIPLE EXTRACTIONS WITH ALVEOLOPLASTY N/A 08/08/2013  ? Procedure: MULTIPLE EXTRACTION OF TEETH #1, 2, 3, 6, 7, 9, 11, 15, 16, 17, 20, 21, 22, 23, 24, 26, 29, 30, 32 WITH ALVEOLOPLASTY AND REMOVAL BUCCAL EXOSTOSIS LEFT MAXILLA;  Surgeon: Gae Bon, DDS;  Location: [place];  Service: Oral Surgery;  Laterality: N/A;  ? shrapnel removal    ? skull during [place] War  ?  ?History reviewed. No pertinent family history.  ?Social History  ? ?Socioeconomic History  ? Marital status: Divorced  ?  Spouse name: Not on file  ? Number of children: 2  ? Years of education: Not on file  ? Highest education level: Not on file  ?Occupational History  ? Occupation: Disabled  ?Tobacco Use  ? Smoking status: Former  ?  Packs/day: 0.25  ?  Years: 5.00  ?  Pack years: 1.25  ?  Types: Cigarettes  ?  Quit date: 03/16/1992  ?  Years since quitting: 29.3  ? Smokeless tobacco: Never  ?Vaping Use  ? Vaping Use: Not on file  ?Substance and Sexual Activity  ? Alcohol use: No  ?  Alcohol/week: 0.0 standard drinks  ? Drug use: No  ? Sexual activity: Not Currently  ?Other Topics Concern  ? Not on file  ?Social History Narrative  ? ** Merged History Encounter **  ?    ? ?Social Determinants of Health  ? ?Financial Resource Strain: Not on file  ?Food Insecurity: Not on file  ?Transportation Needs: Not on file  ?Physical Activity:  Not on file  ?Stress: Not on file  ?Social Connections: Not on file  ?Intimate Partner Violence: Not on file  ?  ? ?Physical Exam  ? ?Vitals:  ? 07/05/21 2233 07/05/21 2233  ?BP:  96/68  ?Pulse:  91  ?Resp:  20  ?Temp:  98.7 ?F (37.1 ?C)  ?SpO2: 97% 99%  ?  ?CONSTITUTIONAL: Chronically ill-appearing, NAD ?NEURO/PSYCH: Awake, nonverbal, not oriented, does not follow commands ?EYES:  eyes equal and reactive ?ENT/NECK:  no LAD, no JVD ?CARDIO: Regular rate, well-perfused, normal S1 and S2 ?PULM:  CTAB no wheezing or rhonchi ?GI/GU:  non-distended, non-tender, feeding tube in place ?MSK/SPINE:  No gross deformities, no edema ?SKIN:  no rash, atraumatic ? ? ?*Additional and/or pertinent findings included in MDM below ? ?Diagnostic and Interventional Summary  ? ? EKG Interpretation ? ?Date/Time:    ?Ventricular Rate:    ?PR Interval:    ?QRS Duration:   ?QT Interval:    ?QTC Calculation:   ?R Axis:     ?Text Interpretation:   ?  ? ?  ? ?Labs Reviewed - No data to display  ?No orders to display  ?  ?Medications  ?feeding supplement (OSMOLITE 1.5 CAL) liquid 237 mL (has no administration in time range)  ?  ? ?Procedures  /  Critical Care ?Procedures ? ?ED Course and Medical Decision Making  ?Initial Impression and Ddx ?Patient here simply to obtain tube feeds, no abdominal tenderness, feeding tube is functioning normally.  Will attempt to provide feeding tube supplies to last the weekend. ? ?Past medical/surgical history that increases complexity of ED encounter: Dementia, dysphagia ? ?Interpretation of Diagnostics ?Not applicable ? ?Patient Reassessment and Ultimate Disposition/Management ?Anticipating discharge after tube feeding. ? ?Patient management required discussion with the following services or consulting groups:  Pharmacy ? ?Complexity of Problems Addressed ?Acute illness or injury that poses threat of life of bodily function ? ?Additional Data Reviewed and Analyzed ?Further history obtained from: ?Further  history from spouse/family member ? ?Additional Factors Impacting ED Encounter Risk ?None ? ?Barth Kirks. Sedonia Small, MD ?[facility] Emergency Medicine ?[email or web address] ? ?Final Clinical Impressions(s) / ED Diagnoses  ? ?  ICD-10-CM   ?1. Uses feeding tube  Z97.8   ?  ?  ?ED Discharge Orders   ? ? None  ? ?  ?  ? ?Discharge Instructions Discussed with and Provided to Patient:  ? ?Discharge Instructions   ?None ?  ? ?  ?Maudie Flakes, MD ?07/05/21 2356 ? ?

## 2021-07-05 NOTE — ED Notes (Addendum)
Per pt's daughter, pt has not had a feed tubing since 12am ?

## 2021-07-06 DIAGNOSIS — Z978 Presence of other specified devices: Secondary | ICD-10-CM | POA: Diagnosis not present

## 2021-07-06 NOTE — ED Notes (Signed)
Patient verbalizes understanding of d/c instructions. Opportunities for questions and answers were provided. Pt d/c from ED and transferred back home via [place]. Pt's wife at home to allow PTAR in ?

## 2021-07-06 NOTE — ED Notes (Signed)
Ptar called 

## 2021-07-06 NOTE — Discharge Instructions (Signed)
You were evaluated in the Emergency Department and after careful evaluation, we did not find any emergent condition requiring admission or further testing in the hospital. ? ?Your exam/testing today was overall reassuring.  Use the provided tube feeds as needed. ? ?Please return to the Emergency Department if you experience any worsening of your condition.  Thank you for allowing [name] to be a part of your care. ? ?

## 2021-07-07 LAB — URINE CULTURE: Culture: >=100000 — AB

## 2021-07-08 ENCOUNTER — Telehealth: Payer: Self-pay

## 2021-07-08 NOTE — Telephone Encounter (Signed)
Post ED Visit - Positive Culture Follow-up: Successful Patient Follow-Up ? ?Culture assessed and recommendations reviewed by: ? ?'[x]'$  [name], Pharm.D. ?'[]'$  Heide Guile, Pharm.D., BCPS AQ-ID ?'[]'$  Parks Neptune, Pharm.D., BCPS ?'[]'$  Alycia Rossetti, Pharm.D., BCPS ?'[]'$  [place], Pharm.D., BCPS, AAHIVP ?'[]'$  Legrand Como, Pharm.D., BCPS, AAHIVP ?'[]'$  Salome Arnt, PharmD, BCPS ?'[]'$  Johnnette Gourd, PharmD, BCPS ?'[]'$  Hughes Better, PharmD, BCPS ?'[]'$  Leeroy Cha, PharmD ? ?Positive urine culture ? ?'[]'$  Patient discharged without antimicrobial prescription and treatment is now indicated ?'[x]'$  Organism is resistant to prescribed ED discharge antimicrobial ?'[]'$  Patient with positive blood cultures ? ?Changes discussed with ED provider: Kathe Becton, PA ?New antibiotic prescription Fosfomycin 3g po x 1 ?Called to [facility] on [address] ? ?Contacted patient daughter date 07/08/2021, time 10:30am ? ? ?[name] ?07/08/2021, 10:36 AM ? ?  ?

## 2021-07-08 NOTE — Progress Notes (Signed)
ED Antimicrobial Stewardship Positive Culture Follow Up  ? ?[name] is an 76 y.o. male who presented to [facility] on 07/05/2021 with a chief complaint of  ?Chief Complaint  ?Patient presents with  ? tube feeding request  ? ? ?Recent Results (from the past 720 hour(s))  ?Urine Culture     Status: Abnormal  ? Collection Time: 07/04/21  5:15 AM  ? Specimen: In/Out Cath Urine  ?Result Value Ref Range Status  ? Specimen Description IN/OUT CATH URINE  Final  ? Special Requests   Final  ?  NONE ?Performed at [facility], [place] [address], [place], [place] [address] ?  ? Culture (A)  Final  ?  >=100,000 COLONIES/mL ESCHERICHIA COLI ?Confirmed Extended Spectrum Beta-Lactamase Producer (ESBL).  In bloodstream infections from ESBL organisms, carbapenems are preferred over piperacillin/tazobactam. They are shown to have a lower risk of mortality. ?20,000 COLONIES/mL ENTEROCOCCUS FAECALIS ?  ? Report Status 07/07/2021 FINAL  Final  ? Organism ID, Bacteria ESCHERICHIA COLI (A)  Final  ? Organism ID, Bacteria ENTEROCOCCUS FAECALIS (A)  Final  ?    Susceptibility  ? Escherichia coli - MIC*  ?  AMPICILLIN >=32 RESISTANT Resistant   ?  CEFAZOLIN >=64 RESISTANT Resistant   ?  CEFEPIME 16 RESISTANT Resistant   ?  CEFTRIAXONE >=64 RESISTANT Resistant   ?  CIPROFLOXACIN >=4 RESISTANT Resistant   ?  GENTAMICIN >=16 RESISTANT Resistant   ?  IMIPENEM <=0.25 SENSITIVE Sensitive   ?  NITROFURANTOIN <=16 SENSITIVE Sensitive   ?  TRIMETH/SULFA >=320 RESISTANT Resistant   ?  AMPICILLIN/SULBACTAM >=32 RESISTANT Resistant   ?  PIP/TAZO <=4 SENSITIVE Sensitive   ?  * >=100,000 COLONIES/mL ESCHERICHIA COLI  ? Enterococcus faecalis - MIC*  ?  AMPICILLIN <=2 SENSITIVE Sensitive   ?  NITROFURANTOIN <=16 SENSITIVE Sensitive   ?  VANCOMYCIN 1 SENSITIVE Sensitive   ?  * 20,000 COLONIES/mL ENTEROCOCCUS FAECALIS  ? ? ?'[x]'$  Treated with keflex, organism resistant to prescribed antimicrobial ?'[]'$  Patient discharged originally without antimicrobial agent  and treatment is now indicated ? ?New antibiotic prescription: Fosfomycin ? ?ED Provider: Kathe Becton, PA-C ? ? ?[name] ?07/08/2021, 8:29 AM ?Clinical Pharmacist ?Monday - Friday phone -  [phone] ?Saturday - Sunday phone - [phone] ? ?

## 2021-07-22 ENCOUNTER — Inpatient Hospital Stay (HOSPITAL_COMMUNITY)
Admission: EM | Admit: 2021-07-22 | Discharge: 2021-07-25 | DRG: 689 | Disposition: A | Discharge status: Home / Self Care | Attending: Internal Medicine | Admitting: Internal Medicine

## 2021-07-22 ENCOUNTER — Other Ambulatory Visit: Payer: Self-pay

## 2021-07-22 ENCOUNTER — Emergency Department (HOSPITAL_COMMUNITY)

## 2021-07-22 ENCOUNTER — Encounter (HOSPITAL_COMMUNITY): Payer: Self-pay

## 2021-07-22 DIAGNOSIS — N3000 Acute cystitis without hematuria: Secondary | ICD-10-CM

## 2021-07-22 DIAGNOSIS — E43 Unspecified severe protein-calorie malnutrition: Secondary | ICD-10-CM | POA: Diagnosis present

## 2021-07-22 DIAGNOSIS — F32A Depression, unspecified: Secondary | ICD-10-CM | POA: Diagnosis present

## 2021-07-22 DIAGNOSIS — E46 Unspecified protein-calorie malnutrition: Secondary | ICD-10-CM

## 2021-07-22 DIAGNOSIS — R627 Adult failure to thrive: Secondary | ICD-10-CM

## 2021-07-22 DIAGNOSIS — E7800 Pure hypercholesterolemia, unspecified: Secondary | ICD-10-CM | POA: Diagnosis present

## 2021-07-22 DIAGNOSIS — Z8546 Personal history of malignant neoplasm of prostate: Secondary | ICD-10-CM

## 2021-07-22 DIAGNOSIS — F419 Anxiety disorder, unspecified: Secondary | ICD-10-CM | POA: Diagnosis present

## 2021-07-22 DIAGNOSIS — M62461 Contracture of muscle, right lower leg: Secondary | ICD-10-CM | POA: Diagnosis present

## 2021-07-22 DIAGNOSIS — F03B0 Unspecified dementia, moderate, without behavioral disturbance, psychotic disturbance, mood disturbance, and anxiety: Secondary | ICD-10-CM | POA: Diagnosis present

## 2021-07-22 DIAGNOSIS — N390 Urinary tract infection, site not specified: Principal | ICD-10-CM | POA: Diagnosis present

## 2021-07-22 DIAGNOSIS — D638 Anemia in other chronic diseases classified elsewhere: Secondary | ICD-10-CM | POA: Diagnosis present

## 2021-07-22 DIAGNOSIS — N138 Other obstructive and reflux uropathy: Secondary | ICD-10-CM | POA: Diagnosis present

## 2021-07-22 DIAGNOSIS — B9620 Unspecified Escherichia coli [E. coli] as the cause of diseases classified elsewhere: Secondary | ICD-10-CM | POA: Diagnosis present

## 2021-07-22 DIAGNOSIS — R1310 Dysphagia, unspecified: Secondary | ICD-10-CM | POA: Diagnosis present

## 2021-07-22 DIAGNOSIS — Z978 Presence of other specified devices: Principal | ICD-10-CM

## 2021-07-22 DIAGNOSIS — M62462 Contracture of muscle, left lower leg: Secondary | ICD-10-CM | POA: Diagnosis present

## 2021-07-22 DIAGNOSIS — Z931 Gastrostomy status: Secondary | ICD-10-CM

## 2021-07-22 DIAGNOSIS — Z681 Body mass index (BMI) 19 or less, adult: Secondary | ICD-10-CM

## 2021-07-22 DIAGNOSIS — N401 Enlarged prostate with lower urinary tract symptoms: Secondary | ICD-10-CM | POA: Diagnosis present

## 2021-07-22 DIAGNOSIS — R400 Somnolence: Secondary | ICD-10-CM | POA: Diagnosis present

## 2021-07-22 DIAGNOSIS — Z20822 Contact with and (suspected) exposure to covid-19: Secondary | ICD-10-CM | POA: Diagnosis present

## 2021-07-22 DIAGNOSIS — F03B3 Unspecified dementia, moderate, with mood disturbance: Secondary | ICD-10-CM | POA: Diagnosis present

## 2021-07-22 DIAGNOSIS — Z79899 Other long term (current) drug therapy: Secondary | ICD-10-CM

## 2021-07-22 DIAGNOSIS — C61 Malignant neoplasm of prostate: Secondary | ICD-10-CM | POA: Diagnosis present

## 2021-07-22 DIAGNOSIS — K219 Gastro-esophageal reflux disease without esophagitis: Secondary | ICD-10-CM | POA: Diagnosis present

## 2021-07-22 DIAGNOSIS — Z603 Acculturation difficulty: Secondary | ICD-10-CM | POA: Diagnosis present

## 2021-07-22 LAB — URINALYSIS, MICROSCOPIC (REFLEX)
Squamous Epithelial / HPF: NONE SEEN (ref 0–5)
WBC, UA: >50 WBC/hpf (ref 0–5)

## 2021-07-22 LAB — CBC WITH DIFFERENTIAL/PLATELET
Abs Immature Granulocytes: 0.07 10*3/uL (ref 0.00–0.07)
Basophils Absolute: 0.1 10*3/uL (ref 0.0–0.1)
Basophils Relative: 1 %
Eosinophils Absolute: 0.2 10*3/uL (ref 0.0–0.5)
Eosinophils Relative: 3 %
HCT: 39.3 % (ref 39.0–52.0)
Hemoglobin: 13.2 g/dL (ref 13.0–17.0)
Immature Granulocytes: 1 %
Lymphocytes Relative: 29 %
Lymphs Abs: 1.7 10*3/uL (ref 0.7–4.0)
MCH: 33.8 pg (ref 26.0–34.0)
MCHC: 33.6 g/dL (ref 30.0–36.0)
MCV: 100.5 fL — ABNORMAL HIGH (ref 80.0–100.0)
Monocytes Absolute: 0.4 10*3/uL (ref 0.1–1.0)
Monocytes Relative: 7 %
Neutro Abs: 3.5 10*3/uL (ref 1.7–7.7)
Neutrophils Relative %: 59 %
Platelets: 272 10*3/uL (ref 150–400)
RBC: 3.91 MIL/uL — ABNORMAL LOW (ref 4.22–5.81)
RDW: 12 % (ref 11.5–15.5)
WBC: 6 10*3/uL (ref 4.0–10.5)
nRBC: 0 % (ref 0.0–0.2)

## 2021-07-22 LAB — COMPREHENSIVE METABOLIC PANEL
ALT: 37 U/L (ref 0–44)
AST: 40 U/L (ref 15–41)
Albumin: 3.4 g/dL — ABNORMAL LOW (ref 3.5–5.0)
Alkaline Phosphatase: 63 U/L (ref 38–126)
Anion gap: 8 (ref 5–15)
BUN: 40 mg/dL — ABNORMAL HIGH (ref 8–23)
CO2: 25 mmol/L (ref 22–32)
Calcium: 9 mg/dL (ref 8.9–10.3)
Chloride: 106 mmol/L (ref 98–111)
Creatinine, Ser: 1.02 mg/dL (ref 0.61–1.24)
GFR, Estimated: >60 mL/min (ref >60)
Glucose, Bld: 86 mg/dL (ref 70–99)
Potassium: 4.6 mmol/L (ref 3.5–5.1)
Sodium: 139 mmol/L (ref 135–145)
Total Bilirubin: 0.5 mg/dL (ref 0.3–1.2)
Total Protein: 6.9 g/dL (ref 6.5–8.1)

## 2021-07-22 LAB — URINALYSIS, ROUTINE W REFLEX MICROSCOPIC
Bilirubin Urine: NEGATIVE
Glucose, UA: NEGATIVE mg/dL
Ketones, ur: NEGATIVE mg/dL
Nitrite: POSITIVE — AB
Specific Gravity, Urine: 1.01 (ref 1.005–1.030)
pH: 7 (ref 5.0–8.0)

## 2021-07-22 LAB — PROTIME-INR
INR: 0.9 (ref 0.8–1.2)
Prothrombin Time: 12.5 seconds (ref 11.4–15.2)

## 2021-07-22 LAB — RESP PANEL BY RT-PCR (FLU A&B, COVID) ARPGX2
Influenza A by PCR: NEGATIVE
Influenza B by PCR: NEGATIVE
SARS Coronavirus 2 by RT PCR: NEGATIVE

## 2021-07-22 LAB — LACTIC ACID, PLASMA: Lactic Acid, Venous: 1.3 mmol/L (ref 0.5–1.9)

## 2021-07-22 LAB — SALICYLATE LEVEL: Salicylate Lvl: <7 mg/dL — ABNORMAL LOW (ref 7.0–30.0)

## 2021-07-22 LAB — CBG MONITORING, ED
Glucose-Capillary: 90 mg/dL (ref 70–99)
Glucose-Capillary: 98 mg/dL (ref 70–99)
Glucose-Capillary: 107 mg/dL — ABNORMAL HIGH (ref 70–99)

## 2021-07-22 LAB — ACETAMINOPHEN LEVEL: Acetaminophen (Tylenol), Serum: <10 ug/mL — ABNORMAL LOW (ref 10–30)

## 2021-07-22 LAB — AMMONIA: Ammonia: 22 umol/L (ref 9–35)

## 2021-07-22 MED ORDER — SODIUM CHLORIDE 0.9 % IV BOLUS
500.0000 mL | Freq: Once | INTRAVENOUS | Status: AC
  Administered 2021-07-22: 500 mL via INTRAVENOUS

## 2021-07-22 MED ORDER — ENOXAPARIN SODIUM 40 MG/0.4ML IJ SOSY: 40.0000 mg | PREFILLED_SYRINGE | INTRAMUSCULAR | Status: DC

## 2021-07-22 MED ORDER — ACETAMINOPHEN 325 MG PO TABS: 650.0000 mg | ORAL_TABLET | Freq: Four times a day (QID) | ORAL | Status: DC | PRN

## 2021-07-22 MED ORDER — PIPERACILLIN-TAZOBACTAM 3.375 G IVPB
3.3750 g | Freq: Three times a day (TID) | INTRAVENOUS | Status: DC
Start: 2021-07-23 — End: 2021-07-25
  Administered 2021-07-23 – 2021-07-25 (×7): 3.375 g via INTRAVENOUS
  Filled 2021-07-22 (×8): qty 50

## 2021-07-22 MED ORDER — BISACODYL 10 MG RE SUPP: 10.0000 mg | Freq: Every day | RECTAL | Status: DC | PRN

## 2021-07-22 MED ORDER — MIDODRINE HCL 2.5 MG PO TABS
2.5000 mg | ORAL_TABLET | Freq: Three times a day (TID) | ORAL | Status: DC
  Administered 2021-07-22 – 2021-07-25 (×9): 2.5 mg
  Filled 2021-07-22 (×11): qty 1

## 2021-07-22 MED ORDER — PANTOPRAZOLE SODIUM 40 MG PO TBEC
40.0000 mg | DELAYED_RELEASE_TABLET | Freq: Every day | ORAL | Status: DC
Start: 2021-07-23 — End: 2021-07-22

## 2021-07-22 MED ORDER — PIPERACILLIN-TAZOBACTAM 3.375 G IVPB 30 MIN
3.3750 g | Freq: Once | INTRAVENOUS | Status: AC
  Administered 2021-07-22: 3.375 g via INTRAVENOUS
  Filled 2021-07-22: qty 50

## 2021-07-22 MED ORDER — ALBUTEROL SULFATE (2.5 MG/3ML) 0.083% IN NEBU: 2.5000 mg | INHALATION_SOLUTION | RESPIRATORY_TRACT | Status: DC | PRN

## 2021-07-22 MED ORDER — METOPROLOL TARTRATE 5 MG/5ML IV SOLN
5.0000 mg | Freq: Once | INTRAVENOUS | Status: AC
  Administered 2021-07-22: 5 mg via INTRAVENOUS
  Filled 2021-07-22: qty 5

## 2021-07-22 MED ORDER — SODIUM CHLORIDE 0.9 % IV SOLN: INTRAVENOUS | Status: AC

## 2021-07-22 MED ORDER — ACETAMINOPHEN 650 MG RE SUPP: 650.0000 mg | Freq: Four times a day (QID) | RECTAL | Status: DC | PRN

## 2021-07-22 MED ORDER — FAMOTIDINE 20 MG PO TABS
20.0000 mg | ORAL_TABLET | Freq: Two times a day (BID) | ORAL | Status: DC
  Administered 2021-07-22 – 2021-07-25 (×6): 20 mg
  Filled 2021-07-22 (×7): qty 1

## 2021-07-22 MED ORDER — PANTOPRAZOLE 2 MG/ML SUSPENSION
40.0000 mg | Freq: Every day | ORAL | Status: DC
  Administered 2021-07-23 – 2021-07-25 (×3): 40 mg
  Filled 2021-07-22 (×4): qty 20

## 2021-07-22 MED ORDER — SODIUM CHLORIDE 0.9% FLUSH
3.0000 mL | Freq: Two times a day (BID) | INTRAVENOUS | Status: DC
  Administered 2021-07-23 – 2021-07-25 (×6): 3 mL via INTRAVENOUS

## 2021-07-22 MED ORDER — BACLOFEN 10 MG PO TABS
5.0000 mg | ORAL_TABLET | Freq: Three times a day (TID) | ORAL | Status: DC
  Administered 2021-07-22 – 2021-07-25 (×9): 5 mg
  Filled 2021-07-22 (×9): qty 1

## 2021-07-22 MED ORDER — OSMOLITE 1.2 CAL PO LIQD
1000.0000 mL | ORAL | Status: DC
  Administered 2021-07-22 – 2021-07-23 (×2): 1000 mL
  Filled 2021-07-22 (×2): qty 1000

## 2021-07-22 NOTE — ED Notes (Signed)
Pt turned and changed. Small smear of feces in brief.  ?

## 2021-07-22 NOTE — ED Notes (Signed)
Rn called pharm to tube osmolite and called materials to bring pump ?

## 2021-07-22 NOTE — Progress Notes (Addendum)
Transition of Care [facility]) - Emergency Department Mini Assessment ? ? ?Patient Details  ?Name: [name] ?MRN: [id number] ?Date of Birth: [birth year] ? ?Transition of Care (TOC) CM/SW Contact:    ?Roseanne Kaufman, RN ?Phone Number: ?07/22/2021, 2:52 PM ? ? ?Clinical Narrative: ?Patient presented to [facility] ED via EMS for c/o of fever and change in mentation. RNCM received TOC consult for HHC/DME. ? ? ?ED Mini Assessment: ? Patient is no longer active with [facility] per  [facility] with [facility] as of 07/08/21. Patient now being followed by [facility] for hospice services.   ? ? What brought you to the Emergency Department? : (P)  family concerned for his safety at home, fever and cough. ?Barriers to Discharge: (P) No Barriers Identified ?  ?  ?Means of departure: (P) Ambulance ?  ?Interventions which prevented an admission or readmission: (P) Home hospice  ?  ?Patient Contact and Communications ?Spoke with: Mamie Nick) daughter [name] [phone] ? ,     ?  ?Patient states their goals for this hospitalization and ongoing recovery are:: (P) retun home safely ? ? ? ? ?Admission diagnosis:  fever ?Patient Active Problem List  ? Diagnosis Date Noted  ? Radiation proctitis   ? Benign neoplasm of sigmoid colon   ? Gastroesophageal reflux disease without esophagitis   ? Decreased hemoglobin   ? Iron deficiency anemia   ? Leg swelling 01/26/2020  ? Abdominal pain   ? Acute pancreatitis   ? Dysuria 11/18/2017  ? History of colonic polyps 10/01/2015  ? Moderate dementia without behavioral disturbance ([place]) 03/22/2015  ? Right-sided low back pain with right-sided sciatica 11/30/2014  ? Neuropathy 11/30/2014  ? Vitamin D insufficiency 07/09/2014  ? Right-sided low back pain without sciatica 07/06/2014  ? Memory loss 06/24/2014  ? Anemia of chronic disease 05/15/2014  ? Internal hemorrhoids with complication 64/40/3474  ? Heme positive stool 05/09/2014  ? Degenerative disc disease, lumbar 05/06/2014  ? Foraminal stenosis of lumbar region  05/06/2014  ? Depression 12/13/2013  ? Prostate cancer ([place]) 12/13/2013  ? Rectal bleed 03/27/2012  ? HYPERCHOLESTEROLEMIA, MILD 05/19/2010  ? 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 Constipation 09/23/2009  ? 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 HEADACHE 09/23/2009  ? ?PCP:  Lurline Del, DO ?Pharmacy:   ?[place], [place] ?[place] ?[place] [address] ?Phone: [phone] Fax: [phone] ? ?[id number] - [place], [place] - [address] ?[address] ?[place] [address] ?Phone: [phone] Fax: [phone] ? ?[facility] ?[address] ?[place] [address] ?Phone: [phone] Fax: [phone] ?  ?

## 2021-07-22 NOTE — H&P (Signed)
?History and Physical  ? ?[name] AQT:[id number] DOB: 01-Nov-1945 DOA: 07/22/2021 ? ?PCP: Lurline Del, DO  ? ?Patient coming from: Home ? ?Chief Complaint: Altered mentation ? ?HPI: [name] is a 76 y.o. male with medical history significant of BPH, hyperlipidemia, depression, prostate cancer, anemia, dementia, GERD, chronic subdural hematoma, failure to thrive, dysphagia, malnutrition presenting due to change in mentation/abnormal symptoms at home. ? ?History obtained with assistance of family and chart review due to patient's dementia and somewhat altered mentation.  Patient has chronic illnesses and is being followed by hospice but currently has previously remained full code.  Hospice RN came to evaluate the patient and was concerned for lower than normal blood pressure and increased somnolence possible hypoxia and family is also reporting there was concern for low blood sugar and possible diaphoresis.  Family does also report some cough as well following feeding.  The hospice RN told the family per chart review that he may be nearing end-of-life and due to the symptoms family ultimately decided to have patient be evaluated in the ED. ? ?In addition family has had concerns about patient care at facilities in the past and have tried to care for him at home but is having difficulty caring for him at home as well.  Will benefit from additional assistance. ? ?Unable to obtain full review of systems due to patient's altered mentation/baseline dementia and language barrier. ? ?ED Course: Vital signs in the ED significant for initial borderline hypotension in the 56Y to 90 systolic but improved to the 56L to 893T systolic.  Lab work-up included CMP with BUN 40, albumin 3.4.  CBC within normal limits.  PT and INR within normal limits.  Lactic acid normal.  Rest were panel for flu COVID-negative.  Aspirin, Tylenol, ammonia level normal.  Urinalysis showing protein, nitrates, leukocytes, bacteria.  Chest x-ray with minimal  atelectasis.  Patient received Zosyn, metoprolol, 500 cc of IV fluid and started on tube feeds in the ED.  Transition of care also consulted. ? ?Review of Systems: Unable to obtain full review of systems due to patient's altered mentation/baseline dementia and language barrier. ? ?Past Medical History:  ?Diagnosis Date  ? Adenomatous polyp of colon 06/2010  ? 4 polyps removed at colonoscopy by Dr Henrene Pastor  ? Anxiety   ? Chronic headaches   ? Depression   ? Diverticulosis of colon 08/2010  ? Internal hemorrhoids 08/2010  ? Prostate cancer ([place])   ? receiving radiation treatment  ? Subdural hematoma, chronic (HCC)   ? Tuberculosis   ? ? ?Past Surgical History:  ?Procedure Laterality Date  ? BIOPSY  01/29/2020  ? Procedure: BIOPSY;  Surgeon: Irene Shipper, MD;  Location: [facility] ENDOSCOPY;  Service: Endoscopy;;  ? COLONOSCOPY WITH PROPOFOL N/A 01/29/2020  ? Procedure: COLONOSCOPY WITH PROPOFOL;  Surgeon: Irene Shipper, MD;  Location: [facility] ENDOSCOPY;  Service: Endoscopy;  Laterality: N/A;  ? ESOPHAGOGASTRODUODENOSCOPY (EGD) WITH PROPOFOL N/A 01/29/2020  ? Procedure: ESOPHAGOGASTRODUODENOSCOPY (EGD) WITH PROPOFOL;  Surgeon: Irene Shipper, MD;  Location: [facility] ENDOSCOPY;  Service: Endoscopy;  Laterality: N/A;  ? FLEXIBLE SIGMOIDOSCOPY  03/29/2012  ? Procedure: FLEXIBLE SIGMOIDOSCOPY;  Surgeon: Inda Castle, MD;  Location: [place];  Service: Endoscopy;  Laterality: N/A;  possibly may do colonoscopy but prep is only for a flex  ? FLEXIBLE SIGMOIDOSCOPY  03/31/2012  ? Procedure: FLEXIBLE SIGMOIDOSCOPY;  Surgeon: Inda Castle, MD;  Location: [place];  Service: Endoscopy;  Laterality: N/A;  ? HEMORRHOID BANDING  03/31/2012  ?  Procedure: HEMORRHOID BANDING;  Surgeon: Inda Castle, MD;  Location: [place];  Service: Endoscopy;  Laterality: N/A;  ? HOT HEMOSTASIS N/A 01/29/2020  ? Procedure: HOT HEMOSTASIS (ARGON PLASMA COAGULATION/BICAP);  Surgeon: Irene Shipper, MD;  Location: [facility] ENDOSCOPY;  Service: Endoscopy;   Laterality: N/A;  ? MULTIPLE EXTRACTIONS WITH ALVEOLOPLASTY N/A 08/08/2013  ? Procedure: MULTIPLE EXTRACTION OF TEETH #1, 2, 3, 6, 7, 9, 11, 15, 16, 17, 20, 21, 22, 23, 24, 26, 29, 30, 32 WITH ALVEOLOPLASTY AND REMOVAL BUCCAL EXOSTOSIS LEFT MAXILLA;  Surgeon: Gae Bon, DDS;  Location: [place];  Service: Oral Surgery;  Laterality: N/A;  ? shrapnel removal    ? skull during [place] War  ? ? ?Social History ? reports that he quit smoking about 29 years ago. His smoking use included cigarettes. He has a 1.25 pack-year smoking history. He has never used smokeless tobacco. He reports that he does not drink alcohol and does not use drugs. ? ?No Known Allergies ? ?History reviewed. No pertinent family history. ? ?Prior to Admission medications   ?Medication Sig Start Date End Date Taking? Authorizing Provider  ?albuterol (VENTOLIN HFA) 108 (90 Base) MCG/ACT inhaler Inhale 2 puffs into the lungs every 4 (four) hours as needed for wheezing or shortness of breath. 07/04/21   Quintella Reichert, MD  ?baclofen (LIORESAL) 10 MG tablet Place 0.5 tablets (5 mg total) into feeding tube 3 (three) times daily. 07/04/21   Quintella Reichert, MD  ?bisacodyl (DULCOLAX) 10 MG suppository Place 1 suppository (10 mg total) rectally daily as needed for moderate constipation. 07/04/21   Quintella Reichert, MD  ?famotidine (PEPCID) 20 MG tablet Place 1 tablet (20 mg total) into feeding tube 2 (two) times daily. 07/04/21   Quintella Reichert, MD  ?ferrous sulfate 325 (65 FE) MG tablet Take 1 tablet (325 mg total) by mouth every other day. 02/03/20   Armando Reichert, MD  ?guaiFENesin (MUCINEX) 600 MG 12 hr tablet Take 1 tablet (600 mg total) by mouth 2 (two) times daily. ?Patient not taking: Reported on 01/26/2020 10/21/17   Couture, Cortni S, PA-C  ?guaiFENesin (ROBITUSSIN) 100 MG/5ML liquid Place 5-10 mLs (100-200 mg total) into feeding tube every 4 (four) hours as needed for cough or to loosen phlegm. 07/04/21   Quintella Reichert, MD  ?Maltodextrin-Xanthan Gum  (RESOURCE THICKENUP CLEAR) POWD Take 120 g by mouth as needed. 02/02/20   Armando Reichert, MD  ?midodrine (PROAMATINE) 2.5 MG tablet Place 1 tablet (2.5 mg total) into feeding tube 3 (three) times daily with meals. 07/04/21   Quintella Reichert, MD  ?pantoprazole (PROTONIX) 40 MG tablet Take 1 tablet (40 mg total) by mouth daily. 02/03/20   Armando Reichert, MD  ?polyethylene glycol (MIRALAX / GLYCOLAX) 17 g packet Take 17 g by mouth 2 (two) times daily. 02/02/20   Armando Reichert, MD  ?senna (SENOKOT) 8.6 MG TABS tablet Take 1 tablet (8.6 mg total) by mouth daily. 02/03/20   Armando Reichert, MD  ? ? ?Physical Exam: ?Vitals:  ? 07/22/21 1830 07/22/21 1845 07/22/21 1900 07/22/21 1915  ?BP: 103/69 (!) 1'04/57 90/67 96/60 '$  ?Pulse: 66 84 73 77  ?Resp: 19 (!) '23 19 14  '$ ?Temp:      ?TempSrc:      ?SpO2: 98% 98% 98% 98%  ?Weight:      ?Height:      ? ? ?Physical Exam ?Constitutional:   ?   Comments: Chronically ill-appearing elderly male with contractures  ?HENT:  ?   Head: Normocephalic  and atraumatic.  ?   Mouth/Throat:  ?   Mouth: Mucous membranes are moist.  ?   Pharynx: Oropharynx is clear.  ?Eyes:  ?   Extraocular Movements: Extraocular movements intact.  ?   Pupils: Pupils are equal, round, and reactive to light.  ?Cardiovascular:  ?   Rate and Rhythm: Normal rate and regular rhythm.  ?   Pulses: Normal pulses.  ?   Heart sounds: Normal heart sounds.  ?Pulmonary:  ?   Effort: Pulmonary effort is normal. No respiratory distress.  ?   Breath sounds: Normal breath sounds.  ?Abdominal:  ?   General: Bowel sounds are normal. There is no distension.  ?   Palpations: Abdomen is soft.  ?   Tenderness: There is no abdominal tenderness.  ?Musculoskeletal:     ?   General: No swelling or deformity.  ?Skin: ?   General: Skin is warm and dry.  ?Neurological:  ?   General: No focal deficit present.  ?   Mental Status: Mental status is at baseline.  ?   Comments: Baseline dementia. Chronic contracture.  ? ?Labs on Admission: I have personally  reviewed following labs and imaging studies ? ?CBC: ?Recent Labs  ?Lab 07/22/21 ?1220  ?WBC 6.0  ?NEUTROABS 3.5  ?HGB 13.2  ?HCT 39.3  ?MCV 100.5*  ?PLT 272  ? ? ?Basic Metabolic Panel: ?Recent Labs  ?Lab 05/09/

## 2021-07-22 NOTE — Progress Notes (Signed)
Manufacturing engineer (ACC) ? ?[name] is our current hospice patient. ? ?He was evaluated in his home today by his primary hospice RN, noted to be increasingly somnolent with soft BP. RN indicated to the family he may be transitioning nearer to EOL.  ? ?Daughter indicated that they may decide to send him to the ED, and the felt like he could be closer to being able to ambulate again. ? ?Family ultimately decided to send to the ED for evaluation. ? ?He has remained a full code and progress towards [address] discussions have not been received well. ? ?ACC will continue to follow. ? ?Should he d/c today, please use [facility] for ambulance transport as they contract this service for our active hospice patients. ? ?Venia Carbon DNP, RN ?[place] Liaison ?

## 2021-07-22 NOTE — ED Notes (Signed)
Pharm called to verify midodrine ?

## 2021-07-22 NOTE — ED Provider Notes (Signed)
?[facility] DEPT ?Provider Note ? ? ?CSN: [id number] ?Arrival date & time: 07/22/21  1201 ? ?  ? ?History ? ?Chief Complaint  ?Patient presents with  ?? Fever  ? ? ?[name] is a 76 y.o. male is a hospice patient with a past medical history of CKD, feeding tube in place, incontinence with catheter in place presenting to the ED with a chief complaint of concern for low blood sugar, diaphoresis.  History is provided by daughter at the bedside.  States that patient has a feeding tube in place and has been feeding appropriately.  However hospice nurse saw the patient this morning and yesterday and was concerned about low blood sugar, diaphoresis.  Daughter also concerned that he had been coughing.  No documented fevers at home.  States that he is still speaking to her as he typically does but she feels that his "voice sounds different."  His extremities are contracted at baseline per his daughter.  Denies any recent injuries or falls.  Patient is currently residing at home with hospice nurse care.  Denies any vomiting, diarrhea or concern for shortness of breath. ? ? ?Fever ?Associated symptoms: cough   ?Associated symptoms: no chest pain, no chills, no diarrhea, no dysuria, no ear pain, no myalgias, no nausea, no rash, no rhinorrhea, no sore throat and no vomiting   ? ?  ? ?Home Medications ?Prior to Admission medications   ?Medication Sig Start Date End Date Taking? Authorizing Provider  ?albuterol (VENTOLIN HFA) 108 (90 Base) MCG/ACT inhaler Inhale 2 puffs into the lungs every 4 (four) hours as needed for wheezing or shortness of breath. 07/04/21   Quintella Reichert, MD  ?baclofen (LIORESAL) 10 MG tablet Place 0.5 tablets (5 mg total) into feeding tube 3 (three) times daily. 07/04/21   Quintella Reichert, MD  ?bisacodyl (DULCOLAX) 10 MG suppository Place 1 suppository (10 mg total) rectally daily as needed for moderate constipation. 07/04/21   Quintella Reichert, MD  ?cephALEXin (KEFLEX) 500 MG  capsule Place 1 capsule (500 mg total) into feeding tube 2 (two) times daily. 07/04/21   Quintella Reichert, MD  ?famotidine (PEPCID) 20 MG tablet Place 1 tablet (20 mg total) into feeding tube 2 (two) times daily. 07/04/21   Quintella Reichert, MD  ?ferrous sulfate 325 (65 FE) MG tablet Take 1 tablet (325 mg total) by mouth every other day. 02/03/20   Armando Reichert, MD  ?guaiFENesin (MUCINEX) 600 MG 12 hr tablet Take 1 tablet (600 mg total) by mouth 2 (two) times daily. ?Patient not taking: Reported on 01/26/2020 10/21/17   Couture, Cortni S, PA-C  ?guaiFENesin (ROBITUSSIN) 100 MG/5ML liquid Place 5-10 mLs (100-200 mg total) into feeding tube every 4 (four) hours as needed for cough or to loosen phlegm. 07/04/21   Quintella Reichert, MD  ?Maltodextrin-Xanthan Gum (RESOURCE THICKENUP CLEAR) POWD Take 120 g by mouth as needed. 02/02/20   Armando Reichert, MD  ?midodrine (PROAMATINE) 2.5 MG tablet Place 1 tablet (2.5 mg total) into feeding tube 3 (three) times daily with meals. 07/04/21   Quintella Reichert, MD  ?pantoprazole (PROTONIX) 40 MG tablet Take 1 tablet (40 mg total) by mouth daily. 02/03/20   Armando Reichert, MD  ?polyethylene glycol (MIRALAX / GLYCOLAX) 17 g packet Take 17 g by mouth 2 (two) times daily. 02/02/20   Armando Reichert, MD  ?senna (SENOKOT) 8.6 MG TABS tablet Take 1 tablet (8.6 mg total) by mouth daily. 02/03/20   Armando Reichert, MD  ?   ? ?Allergies    ?  Patient has no known allergies.   ? ?Review of Systems   ?Review of Systems  ?Constitutional:  Positive for diaphoresis. Negative for appetite change, chills and fever.  ?HENT:  Negative for ear pain, rhinorrhea, sneezing and sore throat.   ?Eyes:  Negative for photophobia and visual disturbance.  ?Respiratory:  Positive for cough. Negative for chest tightness, shortness of breath and wheezing.   ?Cardiovascular:  Negative for chest pain and palpitations.  ?Gastrointestinal:  Negative for abdominal pain, blood in stool, constipation, diarrhea, nausea and vomiting.   ?Genitourinary:  Negative for dysuria, hematuria and urgency.  ?Musculoskeletal:  Negative for myalgias.  ?Skin:  Negative for rash.  ?Neurological:  Negative for dizziness, weakness and light-headedness.  ? ?Physical Exam ?Updated Vital Signs ?BP 101/69   Pulse 77   Temp 98.5 ?F (36.9 ?C) (Rectal)   Resp (!) 21   Ht '5\' 6"'$  (1.676 m)   Wt 54 kg   SpO2 100%   BMI 19.21 kg/m?  ?Physical Exam ?Vitals and nursing note reviewed.  ?Constitutional:   ?   General: He is not in acute distress. ?   Appearance: He is well-developed. He is cachectic.  ?HENT:  ?   Head: Normocephalic and atraumatic.  ?   Nose: Nose normal.  ?Eyes:  ?   General: No scleral icterus.    ?   Right eye: No discharge.     ?   Left eye: No discharge.  ?   Conjunctiva/sclera: Conjunctivae normal.  ?   Pupils: Pupils are equal, round, and reactive to light.  ?Cardiovascular:  ?   Rate and Rhythm: Normal rate and regular rhythm.  ?   Heart sounds: Normal heart sounds. No murmur heard. ?  No friction rub. No gallop.  ?Pulmonary:  ?   Effort: Pulmonary effort is normal. No respiratory distress.  ?   Breath sounds: Normal breath sounds.  ?Abdominal:  ?   General: Bowel sounds are normal. There is no distension.  ?   Palpations: Abdomen is soft.  ?   Tenderness: There is no abdominal tenderness. There is no guarding.  ?   Comments: Feeding tube in place without surrounding tenderness.  ?Genitourinary: ?   Comments: Foley catheter in place. ?Musculoskeletal:     ?   General: Normal range of motion.  ?   Cervical back: Normal range of motion and neck supple.  ?   Comments: Lower extremities are contracted.  Upper extremities are contracted as well but patient able to move slightly.  ?Skin: ?   General: Skin is warm and dry.  ?   Findings: No rash.  ?Neurological:  ?   Mental Status: He is alert.  ?   Motor: No abnormal muscle tone.  ?   Coordination: Coordination normal.  ?   Comments: Patient able to communicate with daughter.  Does not complain of any  pain.  ? ? ?ED Results / Procedures / Treatments   ?Labs ?(all labs ordered are listed, but only abnormal results are displayed) ?Labs Reviewed  ?COMPREHENSIVE METABOLIC PANEL - Abnormal; Notable for the following components:  ?    Result Value  ? BUN 40 (*)   ? Albumin 3.4 (*)   ? All other components within normal limits  ?CBC WITH DIFFERENTIAL/PLATELET - Abnormal; Notable for the following components:  ? RBC 3.91 (*)   ? MCV 100.5 (*)   ? All other components within normal limits  ?RESP PANEL BY RT-PCR (FLU A&B, COVID) [id number]  ?  CULTURE, BLOOD (ROUTINE X 2)  ?CULTURE, BLOOD (ROUTINE X 2)  ?LACTIC ACID, PLASMA  ?PROTIME-INR  ?AMMONIA  ?URINALYSIS, ROUTINE W REFLEX MICROSCOPIC  ?SALICYLATE LEVEL  ?ACETAMINOPHEN LEVEL  ?CBG MONITORING, ED  ? ? ?EKG ?EKG Interpretation ? ?Date/Time:  Tuesday Jul 22 2021 13:49:14 EDT ?Ventricular Rate:  115 ?PR Interval:  166 ?QRS Duration: 104 ?QT Interval:  354 ?QTC Calculation: 490 ?R Axis:   -17 ?Text Interpretation: Atrial fibrillation with rapid V-rate Inferior infarct, old Confirmed by Regan Lemming ([id number]) on 07/22/2021 1:57:29 PM ? ?Radiology ?DG Chest Port 1 View ? ?Result Date: 07/22/2021 ?CLINICAL DATA:  [place] smoker, possible fever, change in mental status. History prostate cancer EXAM: PORTABLE CHEST 1 VIEW COMPARISON:  Portable exam 1311 hours compared to 07/03/2021 FINDINGS: Normal heart size, mediastinal contours, and pulmonary vascularity. Atherosclerotic calcification aorta. Minimal bibasilar atelectasis. Lungs otherwise clear. No acute infiltrate, pleural effusion, or pneumothorax. Osseous structures unremarkable. IMPRESSION: Minimal bibasilar atelectasis. Aortic Atherosclerosis (ICD10-I70.0). Electronically Signed   By: Lavonia Dana M.D.   On: 07/22/2021 13:29   ? ?Procedures ?Procedures  ? ? ?Medications Ordered in ED ?Medications  ?feeding supplement (OSMOLITE 1.2 CAL) liquid 1,000 mL (has no administration in time range)  ?sodium chloride 0.9 % bolus 500 mL (0 mLs  Intravenous Stopped 07/22/21 1339)  ?metoprolol tartrate (LOPRESSOR) injection 5 mg (5 mg Intravenous Given 07/22/21 1402)  ? ? ?ED Course/ Medical Decision Making/ A&P ?Clinical Course as of 07/22/21 1528  ?Tue May 09, 20

## 2021-07-22 NOTE — Progress Notes (Signed)
Pharmacy Antibiotic Note ? ?[name] is a 76 y.o. male admitted on 07/22/2021 with UTI.  Pharmacy has been consulted for zosyn dosing. ? ?Plan: ?Zosyn 3.375g IV q8h (4 hour infusion). ?Hx ESBL E Coli in UCx> consider meropenem if clinically worsens ? ?Height: '5\' 6"'$  (167.6 cm) ?Weight: 54 kg (119 lb 0.8 oz) ?IBW/kg (Calculated) : 63.8 ? ?Temp (24hrs), Avg:98.5 ?F (36.9 ?C), Min:98.5 ?F (36.9 ?C), Max:98.5 ?F (36.9 ?C) ? ?Recent Labs  ?Lab 07/22/21 ?1220  ?WBC 6.0  ?CREATININE 1.02  ?LATICACIDVEN 1.3  ?  ?Estimated Creatinine Clearance: 48.5 mL/min (by C-G formula based on SCr of 1.02 mg/dL).   ? ?No Known Allergies ? ?Antimicrobials this admission: ?5/9 zosyn>> ? ?Dose adjustments this admission: ? ?Microbiology results: ?5/9 BCx2 ?5/9 UCx ? ?Thank you for allowing pharmacy to be a part of this patient?s care. ? ?Eudelia Bunch, Pharm.D ?07/22/2021 8:40 PM ? ?

## 2021-07-22 NOTE — Progress Notes (Addendum)
RNCM spoke with [name]. with [facility] who advised "the patient's hospice nurse saw patient today and advised the family that patient was transitioning. Family indicated they may bring him to the hospital."  ? ? ?RNCM spoke with patient's daughter [name] with interpreter however at the end of call patient's daughter indicates she does not need a interpreter. [name] indicates she wants to continue with [facility] hospice. Thao indicates she was concerned about patient's safety and sent him to the hospital. [name] confirmed that she has a clear understanding of home hospice. RNCM advised patient is being evaluated by EDP. RNCM explained her role in the discharge planning process. [name] reports she is unsure of transportation home and wants patient to return home with hospice after EDP assessment.  ? ?TOC will continue to follow.     ?

## 2021-07-22 NOTE — ED Provider Notes (Signed)
?Physical Exam  ?BP 96/60   Pulse 77   Temp 98.5 ?F (36.9 ?C) (Rectal)   Resp 14   Ht '5\' 6"'$  (1.676 m)   Wt 54 kg   SpO2 98%   BMI 19.21 kg/m?  ? ?Physical Exam ?Vitals and nursing note reviewed.  ?Constitutional:   ?   Appearance: He is cachectic.  ?HENT:  ?   Head: Normocephalic and atraumatic.  ?Eyes:  ?   Conjunctiva/sclera: Conjunctivae normal.  ?Cardiovascular:  ?   Rate and Rhythm: Normal rate and regular rhythm.  ?Pulmonary:  ?   Effort: Pulmonary effort is normal. No respiratory distress.  ?   Breath sounds: Normal breath sounds.  ?Abdominal:  ?   General: There is no distension.  ?   Palpations: Abdomen is soft.  ?   Tenderness: There is no abdominal tenderness.  ?   Comments: Feeding tube in place without surrounding tenderness.   ?Genitourinary: ?   Comments: Condom catheter in place ?Musculoskeletal:  ?   Comments: Lower extremities are contracted.  Upper extremities are contracted as well but patient able to move slightly.   ?Skin: ?   General: Skin is warm and dry.  ?Neurological:  ?   General: No focal deficit present.  ?   Mental Status: He is alert.  ?   Comments: Patient able to communicate with daughter.  Does not complain of any pain.   ? ?Procedures  ?[facility] KitchenCritical Care ?Performed by: Kateri Plummer, PA-C ?Authorized by: Kateri Plummer, PA-C  ? ?Critical care provider statement:  ?  Critical care time (minutes):  30 ?  Critical care time was exclusive of:  Separately billable procedures and treating other patients ?  Critical care was necessary to treat or prevent imminent or life-threatening deterioration of the following conditions:  Sepsis ?  Critical care was time spent personally by me on the following activities:  Development of treatment plan with patient or surrogate, discussions with consultants, evaluation of patient's response to treatment, examination of patient, ordering and review of laboratory studies, ordering and review of radiographic studies, ordering and  performing treatments and interventions, pulse oximetry, re-evaluation of patient's condition and review of old charts ? ?ED Course / MDM  ? ?Clinical Course as of 07/22/21 2012  ?Tue Jul 22, 2021  ?1306 Glucose-Capillary: 23 [HK]  ?1334 Creatinine: 1.02 [HK]  ?1405 Lactic Acid, Venous: 1.3 [HK]  ?1406 Resp Panel by RT-PCR (Flu A&B, Covid) Nasopharyngeal Swab ?Negative. [HK]  ?1411 Patient in A-fib with RVR with rates 115.  Given metoprolol and reassess. [HK]  ?1419 HR improved. [HK]  ?[place] [HK]  ?  ?Clinical Course User Index ?[HK] Delia Heady, PA-C  ? ?Medical Decision Making ?Amount and/or Complexity of Data Reviewed ?Labs: ordered. Decision-making details documented in ED Course. ?Radiology: ordered. ? ?Risk ?OTC drugs. ?Prescription drug management. ?Decision regarding hospitalization. ? ?Accepted handoff at shift change from [facility]. Please see prior provider note for full HPI. ? ?Briefly: Patient is 76 year old male on hospice (full code) with a feeding tube in place who presents to the ER for concern for low blood sugar and diaphoresis.  Although he has been feeding appropriately, his hospice nurse saw him this morning and was concerned about possibly low blood sugar. Has been staying at [facility]. Daughter does not want patient to return to SNF but also states she cannot take care of him at home.  Daughter reports that patient's voice "  sounds different".  She also reports his extremities are contracted at baseline. ? ?DDX/Plan: Plan at time of shift change to follow up on patient's urinalysis to evaluate for UTI. If workup does not yield a reason for admission, plan to consult palliative care and instruct prior consults (TOC/SW and hospice) to communicate with family to determine disposition.  ? ?Urinalysis positive for trace hemoglobin, positive nitrites and large leukocytes consistent with a urinary tract infection.  Started patient on empiric  antibiotics. ? ?I believe after my evaluation of the patient and his laboratory findings, that he warrants admission for IV antibiotics.  I made the daughter aware of this and she is agreeable to this.  I consulted hospitalist Dr. Trilby Drummer who will admit. ?  ?Kateri Plummer, PA-C ?07/22/21 2012 ? ?  ?Lorelle Gibbs, DO ?07/23/21 1408 ? ?

## 2021-07-22 NOTE — ED Triage Notes (Signed)
Pt bib ems from home c/o possible fever and change in mentation. Pt is nonverbal at baseline.  Per ems, pt and family present at home only speak vietnamese.  ?

## 2021-07-23 DIAGNOSIS — N138 Other obstructive and reflux uropathy: Secondary | ICD-10-CM | POA: Diagnosis present

## 2021-07-23 DIAGNOSIS — R627 Adult failure to thrive: Secondary | ICD-10-CM | POA: Diagnosis present

## 2021-07-23 DIAGNOSIS — Z20822 Contact with and (suspected) exposure to covid-19: Secondary | ICD-10-CM | POA: Diagnosis present

## 2021-07-23 DIAGNOSIS — R400 Somnolence: Secondary | ICD-10-CM | POA: Diagnosis present

## 2021-07-23 DIAGNOSIS — N390 Urinary tract infection, site not specified: Secondary | ICD-10-CM | POA: Diagnosis present

## 2021-07-23 DIAGNOSIS — Z8546 Personal history of malignant neoplasm of prostate: Secondary | ICD-10-CM | POA: Diagnosis not present

## 2021-07-23 DIAGNOSIS — Z931 Gastrostomy status: Secondary | ICD-10-CM | POA: Diagnosis not present

## 2021-07-23 DIAGNOSIS — F419 Anxiety disorder, unspecified: Secondary | ICD-10-CM | POA: Diagnosis present

## 2021-07-23 DIAGNOSIS — F03B3 Unspecified dementia, moderate, with mood disturbance: Secondary | ICD-10-CM | POA: Diagnosis present

## 2021-07-23 DIAGNOSIS — R1310 Dysphagia, unspecified: Secondary | ICD-10-CM | POA: Diagnosis present

## 2021-07-23 DIAGNOSIS — E43 Unspecified severe protein-calorie malnutrition: Secondary | ICD-10-CM | POA: Diagnosis present

## 2021-07-23 DIAGNOSIS — Z603 Acculturation difficulty: Secondary | ICD-10-CM | POA: Diagnosis present

## 2021-07-23 DIAGNOSIS — N401 Enlarged prostate with lower urinary tract symptoms: Secondary | ICD-10-CM | POA: Diagnosis present

## 2021-07-23 DIAGNOSIS — K219 Gastro-esophageal reflux disease without esophagitis: Secondary | ICD-10-CM | POA: Diagnosis present

## 2021-07-23 DIAGNOSIS — Z681 Body mass index (BMI) 19 or less, adult: Secondary | ICD-10-CM | POA: Diagnosis not present

## 2021-07-23 DIAGNOSIS — M62461 Contracture of muscle, right lower leg: Secondary | ICD-10-CM | POA: Diagnosis present

## 2021-07-23 DIAGNOSIS — D638 Anemia in other chronic diseases classified elsewhere: Secondary | ICD-10-CM | POA: Diagnosis present

## 2021-07-23 DIAGNOSIS — M62462 Contracture of muscle, left lower leg: Secondary | ICD-10-CM | POA: Diagnosis present

## 2021-07-23 DIAGNOSIS — Z79899 Other long term (current) drug therapy: Secondary | ICD-10-CM | POA: Diagnosis not present

## 2021-07-23 DIAGNOSIS — N3000 Acute cystitis without hematuria: Secondary | ICD-10-CM | POA: Diagnosis not present

## 2021-07-23 DIAGNOSIS — B9620 Unspecified Escherichia coli [E. coli] as the cause of diseases classified elsewhere: Secondary | ICD-10-CM | POA: Diagnosis present

## 2021-07-23 DIAGNOSIS — E7800 Pure hypercholesterolemia, unspecified: Secondary | ICD-10-CM | POA: Diagnosis present

## 2021-07-23 LAB — CBG MONITORING, ED
Glucose-Capillary: 92 mg/dL (ref 70–99)
Glucose-Capillary: 118 mg/dL — ABNORMAL HIGH (ref 70–99)
Glucose-Capillary: 124 mg/dL — ABNORMAL HIGH (ref 70–99)

## 2021-07-23 LAB — CBC
HCT: 30.3 % — ABNORMAL LOW (ref 39.0–52.0)
Hemoglobin: 10.3 g/dL — ABNORMAL LOW (ref 13.0–17.0)
MCH: 34.3 pg — ABNORMAL HIGH (ref 26.0–34.0)
MCHC: 34 g/dL (ref 30.0–36.0)
MCV: 101 fL — ABNORMAL HIGH (ref 80.0–100.0)
Platelets: 235 10*3/uL (ref 150–400)
RBC: 3 MIL/uL — ABNORMAL LOW (ref 4.22–5.81)
RDW: 11.9 % (ref 11.5–15.5)
WBC: 4.7 10*3/uL (ref 4.0–10.5)
nRBC: 0 % (ref 0.0–0.2)

## 2021-07-23 LAB — COMPREHENSIVE METABOLIC PANEL
ALT: 35 U/L (ref 0–44)
AST: 41 U/L (ref 15–41)
Albumin: 2.7 g/dL — ABNORMAL LOW (ref 3.5–5.0)
Alkaline Phosphatase: 56 U/L (ref 38–126)
Anion gap: 5 (ref 5–15)
BUN: 33 mg/dL — ABNORMAL HIGH (ref 8–23)
CO2: 22 mmol/L (ref 22–32)
Calcium: 8.1 mg/dL — ABNORMAL LOW (ref 8.9–10.3)
Chloride: 113 mmol/L — ABNORMAL HIGH (ref 98–111)
Creatinine, Ser: 0.93 mg/dL (ref 0.61–1.24)
GFR, Estimated: >60 mL/min (ref >60)
Glucose, Bld: 101 mg/dL — ABNORMAL HIGH (ref 70–99)
Potassium: 3.9 mmol/L (ref 3.5–5.1)
Sodium: 140 mmol/L (ref 135–145)
Total Bilirubin: 0.8 mg/dL (ref 0.3–1.2)
Total Protein: 5.7 g/dL — ABNORMAL LOW (ref 6.5–8.1)

## 2021-07-23 LAB — GLUCOSE, CAPILLARY
Glucose-Capillary: 122 mg/dL — ABNORMAL HIGH (ref 70–99)
Glucose-Capillary: 110 mg/dL — ABNORMAL HIGH (ref 70–99)

## 2021-07-23 MED ORDER — CHLORHEXIDINE GLUCONATE 0.12 % MT SOLN
15.0000 mL | Freq: Two times a day (BID) | OROMUCOSAL | Status: DC
  Administered 2021-07-23 – 2021-07-25 (×4): 15 mL via OROMUCOSAL
  Filled 2021-07-23 (×3): qty 15

## 2021-07-23 MED ORDER — SODIUM CHLORIDE 0.9 % IV SOLN: INTRAVENOUS | Status: DC | PRN

## 2021-07-23 MED ORDER — ORAL CARE MOUTH RINSE
15.0000 mL | Freq: Two times a day (BID) | OROMUCOSAL | Status: DC
  Administered 2021-07-24 – 2021-07-25 (×4): 15 mL via OROMUCOSAL

## 2021-07-23 NOTE — ED Notes (Signed)
Recliner given to pt's son who remains at bedside.  ?

## 2021-07-23 NOTE — Hospital Course (Addendum)
Mr. Stockhausen is a 76 yo male with PMH depression/anxiety, diverticulosis, prostate cancer, BPH, HLD, dementia, GERD, chronic subdural hematoma, failure to thrive, dysphagia with PEG tube feeding dependence, severe protein calorie malnutrition who presented with a presumed change in mentation per family.  He is followed by hospice at home however remains full code due to family wishes. ?Goals of care discussions were held on admission and family was insistent on treating for underlying suspected infection then discharging patient back home as able.  They do not wish for long-term placement for patient given prior physical decline at last institution. ?Due to language barrier and underlying dementia, patient was unable to provide any collateral information on admission. ?He underwent work-up and urinalysis was notable for positive nitrite, large LE, greater than 50 WBC, rare bacteria.  He was started on Zosyn given a history of resistant organisms and admitted for UTI treatment.  Urine culture was noted to be positive for E. coli ESBL.  He was transitioned to fosfomycin to complete course.  He was considered to be improved back to his baseline which was already a poor quality of life.  This was again discussed with his daughter prior to discharge and he was recommended for further consideration of pursuing full comfort care with hospice on discharge back home.  His daughter stated they wished to continue current treatment. ?

## 2021-07-23 NOTE — Progress Notes (Signed)
?Progress Note ? ? ? ?[name]   ?TZG:[id number]  ?DOB: 1945-11-21  ?DOA: 07/22/2021     0 ?PCP: Lurline Del, DO ? ?Initial CC: AMS ? ?Hospital Course: ?[name] is a 76 yo male with PMH depression/anxiety, diverticulosis, prostate cancer, BPH, HLD, dementia, GERD, chronic subdural hematoma, failure to thrive, dysphagia with PEG tube feeding dependence, severe protein calorie malnutrition who presented with a presumed change in mentation per family.  He is followed by hospice at home however remains full code due to family wishes. ?Goals of care discussions were held on admission and family was insistent on treating for underlying suspected infection then discharging patient back home as able.  They do not wish for long-term placement for patient given prior physical decline at last institution. ?Due to language barrier and underlying dementia, patient was unable to provide any collateral information on admission. ?He underwent work-up and urinalysis was notable for positive nitrite, large LE, greater than 50 WBC, rare bacteria.  He was started on Zosyn given a history of resistant organisms and admitted for UTI treatment. ? ?Interval History:  ? ?Resting in bed when seen in the ER in no distress.  Son is present bedside.  Called and spoke with his daughter on the phone for further collateral information.  She is typically the contact person as his son has a form of autism and cannot provide any collateral information. ?Daughter is amenable for treating for UTI and discharged home once again when able. ? ?Assessment and Plan: ? ?Urinary tract infection ?> Patient with mild change in mentation at home with increased somnolence. ?> Initial work-up was negative but urinalysis did return with protein, nitrates, leukocytes, bacteria. ?> No leukocytosis however patient has a history of resistant organisms susceptible only to Zosyn and Penam's. ?> Started on Zosyn in the ED. ?- Monitor on telemetry ?- Continue with IV Zosyn  per pharmacy ?- Follow-up urine cultures ?- Trend fever curve and white count ?-S/p fluids.  Continue tube feeds ?  ?Dementia ?Failure to thrive ?Dysphagia ?Malnutrition ?Contractures ?> On tube feeds and following with hospice as per HPI ?> Referral placed to [facility] for assistance with family taking care of patient at home as they have been unhappy with SNF in the past. ?> Remains full code. Does follow with hospice outpatient, unclear if there would be benefit from inpatient palliative care consult. Daughter declined.  ?- Continue with tube feeds, consult to nutrition ?- Follow-up TOC consult ?- Continue midodrine and baclofen ?- Supportive care ? ?GERD ?- Continue home PPI and Pepcid ? ? ? ? ?Old records reviewed in assessment of this patient ? ?Antimicrobials: ?Zosyn 07/22/2021 >> current ? ?DVT prophylaxis:  ?Place and maintain sequential compression device Start: 07/22/21 2112 ? ? ?Code Status:   Code Status: Full Code ? ?Disposition Plan: Home in 1 to 2 days ?Status is: Inpatient ? ?Objective: ?Blood pressure 108/71, pulse 77, temperature 98.8 ?F (37.1 ?C), temperature source Axillary, resp. rate 16, height '5\' 6"'$  (1.676 m), weight 54 kg, SpO2 96 %.  ?Examination:  ?Physical Exam ?Constitutional:   ?   Comments: Chronically ill-appearing frail elderly gentleman laying in bed in no distress with contracted lower extremities  ?HENT:  ?   Head: Normocephalic and atraumatic.  ?   Mouth/Throat:  ?   Mouth: Mucous membranes are moist.  ?Eyes:  ?   Extraocular Movements: Extraocular movements intact.  ?Cardiovascular:  ?   Rate and Rhythm: Normal rate and regular rhythm.  ?  Heart sounds: Normal heart sounds.  ?Pulmonary:  ?   Effort: Pulmonary effort is normal. No respiratory distress.  ?   Breath sounds: Normal breath sounds. No wheezing.  ?Abdominal:  ?   General: Bowel sounds are normal. There is no distension.  ?   Palpations: Abdomen is soft.  ?   Tenderness: There is no abdominal tenderness.  ?   Comments: Thin  abdomen.  PEG tube in place  ?Musculoskeletal:  ?   Cervical back: Normal range of motion and neck supple.  ?   Comments: Thin extremities  ?Skin: ?   General: Skin is warm and dry.  ?Neurological:  ?   Comments: Unable to follow commands  ?  ? ?Consultants:  ? ? ?Procedures:  ? ? ?Data Reviewed: ?Results for orders placed or performed during the hospital encounter of 07/22/21 (from the past 24 hour(s))  ?CBG monitoring, ED     Status: Abnormal  ? Collection Time: 07/22/21 11:11 PM  ?Result Value Ref Range  ? Glucose-Capillary 107 (H) 70 - 99 mg/dL  ?CBG monitoring, ED     Status: None  ? Collection Time: 07/23/21  4:48 AM  ?Result Value Ref Range  ? Glucose-Capillary 92 70 - 99 mg/dL  ?Comprehensive metabolic panel     Status: Abnormal  ? Collection Time: 07/23/21  5:00 AM  ?Result Value Ref Range  ? Sodium 140 135 - 145 mmol/L  ? Potassium 3.9 3.5 - 5.1 mmol/L  ? Chloride 113 (H) 98 - 111 mmol/L  ? CO2 22 22 - 32 mmol/L  ? Glucose, Bld 101 (H) 70 - 99 mg/dL  ? BUN 33 (H) 8 - 23 mg/dL  ? Creatinine, Ser 0.93 0.61 - 1.24 mg/dL  ? Calcium 8.1 (L) 8.9 - 10.3 mg/dL  ? Total Protein 5.7 (L) 6.5 - 8.1 g/dL  ? Albumin 2.7 (L) 3.5 - 5.0 g/dL  ? AST 41 15 - 41 U/L  ? ALT 35 0 - 44 U/L  ? Alkaline Phosphatase 56 38 - 126 U/L  ? Total Bilirubin 0.8 0.3 - 1.2 mg/dL  ? GFR, Estimated >60 >60 mL/min  ? Anion gap 5 5 - 15  ?CBC     Status: Abnormal  ? Collection Time: 07/23/21  5:00 AM  ?Result Value Ref Range  ? WBC 4.7 4.0 - 10.5 K/uL  ? RBC 3.00 (L) 4.22 - 5.81 MIL/uL  ? Hemoglobin 10.3 (L) 13.0 - 17.0 g/dL  ? HCT 30.3 (L) 39.0 - 52.0 %  ? MCV 101.0 (H) 80.0 - 100.0 fL  ? MCH 34.3 (H) 26.0 - 34.0 pg  ? MCHC 34.0 30.0 - 36.0 g/dL  ? RDW 11.9 11.5 - 15.5 %  ? Platelets 235 150 - 400 K/uL  ? nRBC 0.0 0.0 - 0.2 %  ?CBG monitoring, ED     Status: Abnormal  ? Collection Time: 07/23/21  7:46 AM  ?Result Value Ref Range  ? Glucose-Capillary 118 (H) 70 - 99 mg/dL  ?CBG monitoring, ED     Status: Abnormal  ? Collection Time: 07/23/21  11:41 AM  ?Result Value Ref Range  ? Glucose-Capillary 124 (H) 70 - 99 mg/dL  ?Glucose, capillary     Status: Abnormal  ? Collection Time: 07/23/21  4:43 PM  ?Result Value Ref Range  ? Glucose-Capillary 122 (H) 70 - 99 mg/dL  ? Comment 1 Notify RN   ? Comment 2 Document in Chart   ?  ?I have Reviewed nursing notes, Vitals, and  Lab results since pt's last encounter. Pertinent lab results : see above ?I have ordered test including BMP, CBC, Mg ?I have reviewed the last note from staff over past 24 hours ?I have discussed pt's care plan and test results with nursing staff, case manager ? ? LOS: 0 days  ? ?Dwyane Dee, MD ?[facility] ?07/23/2021, 4:54 PM ?

## 2021-07-23 NOTE — Progress Notes (Signed)
Chaplain spoke with [name]'s daughter by phone.  After gaining some clarity, Chaplain recognized that daughter does not need a Healthcare POA.  She was referring to becoming a Durable POA to obtain access or benefits to [employer], banking, finances, etc.  Chaplain explained the difference and that a healthcare POA is not needed at this time.  Chaplain also learned through Case Worker that [employer] does not have the capacity at this time to complete such paperwork.   ? ?Chaplain offered clarity on what she is capable of completing and let daughter know to follow-up with a Education officer, museum on clarity on what she needs to do to proceed.  She did understand that she may need to go to a courthouse.   ? ?Chaplain offered education, listening, support.  ? ? ? 07/23/21 1400  ?Clinical Encounter Type  ?Visited With Family  ?Visit Type Initial;Social support  ? ? ?

## 2021-07-23 NOTE — ED Notes (Signed)
Pt changed- small soft BM. Turned and mouth care performed.  ?

## 2021-07-23 NOTE — Progress Notes (Signed)
[place] [facility]) Hospitalized Hospice Patient ? ?[name] is a current hospice patient with a terminal diagnosis of end-stage cerebrovascular disease. He was evaluated in his home by hospice on 07/22/21, found to be less responsive than normal. Decision was ultimately made by the family to send him to the hospital for further evaluation and possible treatment. He is admitted with a UTI. This is a related hospital admission per Dr. Tomasa Hosteller, [facility] MD. ? ?Patient asleep during visit, son at the bedside asleep as well. Per nursing report, he was resting well and his daughter had just left. Attempted to reach her, she did not answer and no option to leave a message. ? ?[name] is inpatient appropriate for management of UTI requiring IV abx.  ? ?V/S: 98.8 axillary, 108/71, HR 77, RR 16, SPO2 96% RA ?I&O: no input recorded, 550 out ?Labs: albumin 2.7, RBC 3.0, hgb 10.3, HCT 30.3, MCV 101, MCH 34.3 ?Diagnostics: chest xray - Minimal bibasilar atelectasis ?IV/PRN: NS continuous @ 125 ml/hr (discontinued @ 0900 am), lopressor 5 mg IV x 1 for elevated HR, zosyn 3.375 mg IV TID for UTI ? ?Problem List: ?- UTI - afebrile, no leukocytosis, zosyn IV ?- dementia/FTT - recently admitted under hospice services for this.  ? ?GOC: ongoing. He is a full code and discussions surrounding code status have not been received so far. ?D/C planning: likely return home with hospice ?IDT: hospice team notified ?Family: present at bedside but asleep, unable to leave VM for daughter ? ?Once ready for d/c, please use [facility] for ambulance transport as they contract this service for our active hospice patients. ? ?Venia Carbon DNP, RN ?[facility] Liaison ? ?

## 2021-07-24 DIAGNOSIS — N3000 Acute cystitis without hematuria: Secondary | ICD-10-CM | POA: Diagnosis not present

## 2021-07-24 LAB — BASIC METABOLIC PANEL
Anion gap: 7 (ref 5–15)
BUN: 25 mg/dL — ABNORMAL HIGH (ref 8–23)
CO2: 23 mmol/L (ref 22–32)
Calcium: 8.8 mg/dL — ABNORMAL LOW (ref 8.9–10.3)
Chloride: 111 mmol/L (ref 98–111)
Creatinine, Ser: 1.07 mg/dL (ref 0.61–1.24)
GFR, Estimated: >60 mL/min (ref >60)
Glucose, Bld: 129 mg/dL — ABNORMAL HIGH (ref 70–99)
Potassium: 4.3 mmol/L (ref 3.5–5.1)
Sodium: 141 mmol/L (ref 135–145)

## 2021-07-24 LAB — CBC WITH DIFFERENTIAL/PLATELET
Abs Immature Granulocytes: 0.04 10*3/uL (ref 0.00–0.07)
Basophils Absolute: 0.1 10*3/uL (ref 0.0–0.1)
Basophils Relative: 1 %
Eosinophils Absolute: 0.2 10*3/uL (ref 0.0–0.5)
Eosinophils Relative: 4 %
HCT: 33.5 % — ABNORMAL LOW (ref 39.0–52.0)
Hemoglobin: 11.2 g/dL — ABNORMAL LOW (ref 13.0–17.0)
Immature Granulocytes: 1 %
Lymphocytes Relative: 19 %
Lymphs Abs: 1.3 10*3/uL (ref 0.7–4.0)
MCH: 33.5 pg (ref 26.0–34.0)
MCHC: 33.4 g/dL (ref 30.0–36.0)
MCV: 100.3 fL — ABNORMAL HIGH (ref 80.0–100.0)
Monocytes Absolute: 0.5 10*3/uL (ref 0.1–1.0)
Monocytes Relative: 7 %
Neutro Abs: 4.7 10*3/uL (ref 1.7–7.7)
Neutrophils Relative %: 68 %
Platelets: 267 10*3/uL (ref 150–400)
RBC: 3.34 MIL/uL — ABNORMAL LOW (ref 4.22–5.81)
RDW: 12 % (ref 11.5–15.5)
WBC: 6.9 10*3/uL (ref 4.0–10.5)
nRBC: 0 % (ref 0.0–0.2)

## 2021-07-24 LAB — GLUCOSE, CAPILLARY
Glucose-Capillary: 124 mg/dL — ABNORMAL HIGH (ref 70–99)
Glucose-Capillary: 124 mg/dL — ABNORMAL HIGH (ref 70–99)
Glucose-Capillary: 124 mg/dL — ABNORMAL HIGH (ref 70–99)
Glucose-Capillary: 127 mg/dL — ABNORMAL HIGH (ref 70–99)
Glucose-Capillary: 179 mg/dL — ABNORMAL HIGH (ref 70–99)
Glucose-Capillary: 94 mg/dL (ref 70–99)

## 2021-07-24 LAB — MAGNESIUM: Magnesium: 2.3 mg/dL (ref 1.7–2.4)

## 2021-07-24 MED ORDER — OSMOLITE 1.5 CAL PO LIQD
237.0000 mL | Freq: Every day | ORAL | Status: DC
  Administered 2021-07-24 – 2021-07-25 (×7): 237 mL
  Filled 2021-07-24 (×9): qty 237

## 2021-07-24 MED ORDER — ADULT MULTIVITAMIN W/MINERALS CH
1.0000 | ORAL_TABLET | Freq: Every day | ORAL | Status: DC
  Administered 2021-07-24 – 2021-07-25 (×2): 1
  Filled 2021-07-24 (×2): qty 1

## 2021-07-24 MED ORDER — INSULIN ASPART 100 UNIT/ML IJ SOLN
0.0000 [IU] | INTRAMUSCULAR | Status: DC
  Administered 2021-07-24 – 2021-07-25 (×2): 1 [IU] via SUBCUTANEOUS
  Administered 2021-07-25: 0 [IU] via SUBCUTANEOUS
  Administered 2021-07-25 (×2): 1 [IU] via SUBCUTANEOUS

## 2021-07-24 NOTE — Progress Notes (Addendum)
Initial Nutrition Assessment ? ?INTERVENTION:  ? ?-Will transition back to bolus feed regimen ?-237 ml Osmolite 1.5 fives times daily via PEG ?-60 ml free water before and after each feed ?-This provides 1775 kcals, 74g protein, and 1505 ml H2O ? ?-Multivitamin with minerals daily via tube ? ?NUTRITION DIAGNOSIS:  ? ?Increased nutrient needs related to chronic illness as evidenced by estimated needs. ? ?GOAL:  ? ?Patient will meet greater than or equal to 90% of their needs ? ?MONITOR:  ? ?Labs, Weight trends, TF tolerance, I & O's, Skin ? ?REASON FOR ASSESSMENT:  ? ?Consult ?Enteral/tube feeding initiation and management, Assessment of nutrition requirement/status ? ?ASSESSMENT:  ? ?76 yo male with PMH depression/anxiety, diverticulosis, prostate cancer, BPH, HLD, dementia, GERD, chronic subdural hematoma, failure to thrive, dysphagia with PEG tube feeding dependence, severe protein calorie malnutrition who presented with a presumed change in mentation per family. Patient with a terminal diagnosis of end-stage cerebrovascular disease, followed by hospice. Admitted for treatment of UTI. ? ?Patient in room, daughter at bedside. Pt had a large bowel movement that RN and nurse tech were cleaning up and given pt a bath.  ?Pt speaks vietnamese, daughter able to speak english and provided history.  ?Per daughter, pt was at a facility that she was not happy with and does not believe her father was taken care of. ?She states the pt was doing well with his feeds but she was having concerns about his PEG. She reports there was white build-up in the tube and was wondering if he needs a new tube. Alerted RN and she was to take a look. ? ?Daughter states he was also taking in a lot of yogurt. Per chart review, pt has been having coughing. Daughter reports pt has been upright when consuming POs and tube feeds. ? ?Daughter agreeable to resuming bolus feed regimen. PTA pt was administering 237 ml Osmolite 1.5 QID via PEG. 60 ml  free water before and after each feed.  ?Will increase to 5 feeds a day which will increase total volume of fluids as well.  ? ?Per weight records in care everywhere,  pt weighed 96 lbs on 06/13/21.  ?Current weight: 119 lbs. ? ?Medications reviewed. ? ?Labs reviewed: ? CBGs: 110-127 ? ?NUTRITION - FOCUSED PHYSICAL EXAM: ? ?Patient had just had a large bowel movement was being cleaned up by RN and tech during visit. Pt with contractures of BUEs and BLEs. ?Suspect some degree of malnutrition. ? ?Diet Order:   ?Diet Order   ? ? None  ? ?  ? ? ?EDUCATION NEEDS:  ? ?Not appropriate for education at this time ? ?Skin:  Skin Assessment: Skin Integrity Issues: ?Skin Integrity Issues:: Other (Comment) ?Other: blisters on left buttocks ? ?Last BM:  5/11 -type 6 ? ?Height:  ? ?Ht Readings from Last 1 Encounters:  ?07/22/21 '5\' 6"'$  (1.676 m)  ? ? ?Weight:  ? ?Wt Readings from Last 1 Encounters:  ?07/22/21 54 kg  ? ? ?BMI:  Body mass index is 19.21 kg/m?. ? ?Estimated Nutritional Needs:  ? ?Kcal:  1600-1800 ? ?Protein:  80-90g ? ?Fluid:  1.6L/day ? ?Clayton Bibles, MS, RD, LDN ?Inpatient Clinical Dietitian ?Contact information available via Amion ? ?

## 2021-07-24 NOTE — Progress Notes (Signed)
?Progress Note ? ? ? ?Valdis Chastang   ?TDV:[id number]  ?DOB: May 24, 1945  ?DOA: 07/22/2021     1 ?PCP: Lurline Del, DO ? ?Initial CC: AMS ? ?Hospital Course: ?Mr. Petruzzi is a 76 yo male with PMH depression/anxiety, diverticulosis, prostate cancer, BPH, HLD, dementia, GERD, chronic subdural hematoma, failure to thrive, dysphagia with PEG tube feeding dependence, severe protein calorie malnutrition who presented with a presumed change in mentation per family.  He is followed by hospice at home however remains full code due to family wishes. ?Goals of care discussions were held on admission and family was insistent on treating for underlying suspected infection then discharging patient back home as able.  They do not wish for long-term placement for patient given prior physical decline at last institution. ?Due to language barrier and underlying dementia, patient was unable to provide any collateral information on admission. ?He underwent work-up and urinalysis was notable for positive nitrite, large LE, greater than 50 WBC, rare bacteria.  He was started on Zosyn given a history of resistant organisms and admitted for UTI treatment. ? ?Interval History:  ?No events overnight. Patient resting in bed in his usual state this morning. NAD.  ? ?Assessment and Plan: ? ?Urinary tract infection ?> Patient with mild change in mentation at home with increased somnolence. ?> Initial work-up was negative but urinalysis did return with protein, nitrates, leukocytes, bacteria. ?> No leukocytosis however patient has a history of resistant organisms susceptible only to Zosyn and Penam's. ?> Started on Zosyn in the ED. ?- Monitor on telemetry ?- Continue Zosyn ?- Follow-up urine cultures: growing E coli  ?- Trend fever curve and white count ?-S/p fluids.  Continue tube feeds ?  ?Dementia ?Failure to thrive ?Dysphagia ?Malnutrition ?Contractures ?> On tube feeds and following with hospice as per HPI ?> Referral placed to [facility] for assistance with  family taking care of patient at home as they have been unhappy with SNF in the past. ?> Remains full code. Does follow with hospice outpatient, unclear if there would be benefit from inpatient palliative care consult. Daughter declined.  ?- Continue with tube feeds, consult to nutrition ?- Follow-up TOC consult ?- Continue midodrine and baclofen ?- Supportive care ? ?GERD ?- Continue home PPI and Pepcid ? ? ? ?Old records reviewed in assessment of this patient ? ?Antimicrobials: ?Zosyn 07/22/2021 >> current ? ?DVT prophylaxis:  ?Place and maintain sequential compression device Start: 07/22/21 2112 ? ? ?Code Status:   Code Status: Full Code ? ?Disposition Plan: Home once culture sensitivities return ?Status is: Inpatient ? ?Objective: ?Blood pressure (!) 121/58, pulse 67, temperature 98.3 ?F (36.8 ?C), temperature source Oral, resp. rate 17, height '5\' 6"'$  (1.676 m), weight 54 kg, SpO2 100 %.  ?Examination:  ?Physical Exam ?Constitutional:   ?   Comments: Chronically ill-appearing frail elderly gentleman laying in bed in no distress with contracted lower extremities  ?HENT:  ?   Head: Normocephalic and atraumatic.  ?   Mouth/Throat:  ?   Mouth: Mucous membranes are moist.  ?Eyes:  ?   Extraocular Movements: Extraocular movements intact.  ?Cardiovascular:  ?   Rate and Rhythm: Normal rate and regular rhythm.  ?   Heart sounds: Normal heart sounds.  ?Pulmonary:  ?   Effort: Pulmonary effort is normal. No respiratory distress.  ?   Breath sounds: Normal breath sounds. No wheezing.  ?Abdominal:  ?   General: Bowel sounds are normal. There is no distension.  ?   Palpations: Abdomen is  soft.  ?   Tenderness: There is no abdominal tenderness.  ?   Comments: Thin abdomen.  PEG tube in place  ?Musculoskeletal:  ?   Cervical back: Normal range of motion and neck supple.  ?   Comments: Thin extremities  ?Skin: ?   General: Skin is warm and dry.  ?Neurological:  ?   Comments: Unable to follow commands  ?  ? ?Consultants:   ? ? ?Procedures:  ? ? ?Data Reviewed: ?Results for orders placed or performed during the hospital encounter of 07/22/21 (from the past 24 hour(s))  ?Glucose, capillary     Status: Abnormal  ? Collection Time: 07/23/21  4:43 PM  ?Result Value Ref Range  ? Glucose-Capillary 122 (H) 70 - 99 mg/dL  ? Comment 1 Notify RN   ? Comment 2 Document in Chart   ?Glucose, capillary     Status: Abnormal  ? Collection Time: 07/23/21  9:00 PM  ?Result Value Ref Range  ? Glucose-Capillary 110 (H) 70 - 99 mg/dL  ?Glucose, capillary     Status: Abnormal  ? Collection Time: 07/24/21 12:10 AM  ?Result Value Ref Range  ? Glucose-Capillary 127 (H) 70 - 99 mg/dL  ?Glucose, capillary     Status: Abnormal  ? Collection Time: 07/24/21  4:40 AM  ?Result Value Ref Range  ? Glucose-Capillary 124 (H) 70 - 99 mg/dL  ?Basic metabolic panel     Status: Abnormal  ? Collection Time: 07/24/21  6:23 AM  ?Result Value Ref Range  ? Sodium 141 135 - 145 mmol/L  ? Potassium 4.3 3.5 - 5.1 mmol/L  ? Chloride 111 98 - 111 mmol/L  ? CO2 23 22 - 32 mmol/L  ? Glucose, Bld 129 (H) 70 - 99 mg/dL  ? BUN 25 (H) 8 - 23 mg/dL  ? Creatinine, Ser 1.07 0.61 - 1.24 mg/dL  ? Calcium 8.8 (L) 8.9 - 10.3 mg/dL  ? GFR, Estimated >60 >60 mL/min  ? Anion gap 7 5 - 15  ?CBC with Differential/Platelet     Status: Abnormal  ? Collection Time: 07/24/21  6:23 AM  ?Result Value Ref Range  ? WBC 6.9 4.0 - 10.5 K/uL  ? RBC 3.34 (L) 4.22 - 5.81 MIL/uL  ? Hemoglobin 11.2 (L) 13.0 - 17.0 g/dL  ? HCT 33.5 (L) 39.0 - 52.0 %  ? MCV 100.3 (H) 80.0 - 100.0 fL  ? MCH 33.5 26.0 - 34.0 pg  ? MCHC 33.4 30.0 - 36.0 g/dL  ? RDW 12.0 11.5 - 15.5 %  ? Platelets 267 150 - 400 K/uL  ? nRBC 0.0 0.0 - 0.2 %  ? Neutrophils Relative % 68 %  ? Neutro Abs 4.7 1.7 - 7.7 K/uL  ? Lymphocytes Relative 19 %  ? Lymphs Abs 1.3 0.7 - 4.0 K/uL  ? Monocytes Relative 7 %  ? Monocytes Absolute 0.5 0.1 - 1.0 K/uL  ? Eosinophils Relative 4 %  ? Eosinophils Absolute 0.2 0.0 - 0.5 K/uL  ? Basophils Relative 1 %  ? Basophils  Absolute 0.1 0.0 - 0.1 K/uL  ? Immature Granulocytes 1 %  ? Abs Immature Granulocytes 0.04 0.00 - 0.07 K/uL  ?Magnesium     Status: None  ? Collection Time: 07/24/21  6:23 AM  ?Result Value Ref Range  ? Magnesium 2.3 1.7 - 2.4 mg/dL  ?Glucose, capillary     Status: Abnormal  ? Collection Time: 07/24/21  7:46 AM  ?Result Value Ref Range  ? Glucose-Capillary 124 (H)  70 - 99 mg/dL  ?Glucose, capillary     Status: Abnormal  ? Collection Time: 07/24/21 12:21 PM  ?Result Value Ref Range  ? Glucose-Capillary 124 (H) 70 - 99 mg/dL  ?  ?I have Reviewed nursing notes, Vitals, and Lab results since pt's last encounter. Pertinent lab results : see above ?I have ordered test including BMP, CBC, Mg ?I have reviewed the last note from staff over past 24 hours ?I have discussed pt's care plan and test results with nursing staff, case manager ? ? LOS: 1 day  ? ?Dwyane Dee, MD ?[facility] ?07/24/2021, 3:25 PM ?

## 2021-07-24 NOTE — Progress Notes (Signed)
[facility]) Hospitalized Hospice patient note ? ?[place] [facility]) Hospitalized Hospice Patient ?  ?[name] is a current hospice patient with a terminal diagnosis of end-stage cerebrovascular disease. He was evaluated in his home by hospice on 07/22/21, found to be less responsive than normal. Decision was ultimately made by the family to send him to the hospital for further evaluation and possible treatment. He is admitted with a UTI. This is a related hospital admission per Dr. Tomasa Hosteller, [facility] MD. ? ?Patient with eyes open during visit though does not respond to questions. Contracted lower extremities. No family at bedside.  ? ?[name] remains inpatient appropriate for management of UTI requiring IV abx. ? ?V/S: 98.2 oral, 94/63, 79, 18, 94% on room air ?I/O: 1881.4/ 2000 ?Abnormal Lab Work: Glucose: 129 (H), BUN: 25(H) ?Calcium: 8.8 (L), RBC: 3.34 (L), Hemoglobin: 11.2 (L), HCT: 33.5 (L), MCV: 100.3 (H) ?Diagnostics: None new ?IV's/PRN: piperacillin-tazobactam (ZOSYN) IVPB 3.375 g ? ?Assessment and Plan:  ?Urinary tract infection ?> Patient with mild change in mentation at home with increased somnolence. ?> Initial work-up was negative but urinalysis did return with protein, nitrates, leukocytes, bacteria. ?> No leukocytosis however patient has a history of resistant organisms susceptible only to Zosyn and Penam's. ?> Started on Zosyn in the ED. ?- Monitor on telemetry ?- Continue with IV Zosyn per pharmacy ?- Follow-up urine cultures ?- Trend fever curve and white count ?-S/p fluids.  Continue tube feeds ?  ?Dementia ?Failure to thrive ?Dysphagia ?Malnutrition ?Contractures ?> On tube feeds and following with hospice as per HPI ?> Referral placed to [facility] for assistance with family taking care of patient at home as they have been unhappy with SNF in the past. ?> Remains full code. Does follow with hospice outpatient, unclear if there would be benefit from  inpatient palliative care consult. Daughter declined.  ?- Continue with tube feeds, consult to nutrition ?- Follow-up TOC consult ?- Continue midodrine and baclofen ?- Supportive care ? ?GERD ?- Continue home PPI and Pepcid ?   ?Old records reviewed in assessment of this patient ?  ?Antimicrobials: ?Zosyn 07/22/2021 >> current ?  ?DVT prophylaxis:  ?Place and maintain sequential compression device Start: 07/22/21 2112 ? ?GOC: ongoing, remains full code, family declined inpatient PMT consult from MD ?D/C Planning: Likely return home with support of hospice ?IDT: updated ?Family: phone call to daughter to update. No questions asked. Requested Hospice SW to call, email sent. ? ?When pt ready for d/c, please use [facility] for ambulance transport as they contract this service for our active hospice patients. ? ?Please call with any hospice related questions. ? ?Thank you, ?Margaretmary Eddy, RN, BSN ?[facility] Liaison ?[phone] ?  ? ? ?

## 2021-07-24 NOTE — Progress Notes (Signed)
?   07/24/21 2016  ?Assess: MEWS Score  ?Temp 98.7 ?F (37.1 ?C)  ?BP 90/69  ?Pulse Rate (!) 107  ?Resp 17  ?O2 Device Room Air  ?Assess: MEWS Score  ?MEWS Temp 0  ?MEWS Systolic 1  ?MEWS Pulse 1  ?MEWS RR 0  ?MEWS LOC 0  ?MEWS Score 2  ?MEWS Score Color Yellow  ?Assess: if the MEWS score is Yellow or Red  ?Were vital signs taken at a resting state? Yes  ?Focused Assessment No change from prior assessment  ?Does the patient meet 2 or more of the SIRS criteria? No  ?MEWS guidelines implemented *See Row Information* Yes  ?Take Vital Signs  ?Increase Vital Sign Frequency  Yellow: Q 2hr X 2 then Q 4hr X 2, if remains yellow, continue Q 4hrs  ?Escalate  ?MEWS: Escalate Yellow: discuss with charge nurse/RN and consider discussing with provider and RRT  ?Notify: Charge Nurse/RN  ?Name of Charge Nurse/RN Notified Colletta Maryland, RN  ?Date Charge Nurse/RN Notified 07/24/21  ?Time Charge Nurse/RN Notified 2018  ?Document  ?Patient Outcome Other (Comment) ?(Pt stable on unit)  ?Assess: SIRS CRITERIA  ?SIRS Temperature  0  ?SIRS Pulse 1  ?SIRS Respirations  0  ?SIRS WBC 0  ?SIRS Score Sum  1  ? ? ?

## 2021-07-25 DIAGNOSIS — R627 Adult failure to thrive: Secondary | ICD-10-CM | POA: Diagnosis not present

## 2021-07-25 DIAGNOSIS — N3000 Acute cystitis without hematuria: Secondary | ICD-10-CM | POA: Diagnosis not present

## 2021-07-25 DIAGNOSIS — R1310 Dysphagia, unspecified: Secondary | ICD-10-CM | POA: Diagnosis not present

## 2021-07-25 LAB — BASIC METABOLIC PANEL
Anion gap: 6 (ref 5–15)
BUN: 23 mg/dL (ref 8–23)
CO2: 23 mmol/L (ref 22–32)
Calcium: 8.4 mg/dL — ABNORMAL LOW (ref 8.9–10.3)
Chloride: 108 mmol/L (ref 98–111)
Creatinine, Ser: 1.19 mg/dL (ref 0.61–1.24)
GFR, Estimated: >60 mL/min (ref >60)
Glucose, Bld: 100 mg/dL — ABNORMAL HIGH (ref 70–99)
Potassium: 4.3 mmol/L (ref 3.5–5.1)
Sodium: 137 mmol/L (ref 135–145)

## 2021-07-25 LAB — CBC WITH DIFFERENTIAL/PLATELET
Abs Immature Granulocytes: 0.04 10*3/uL (ref 0.00–0.07)
Basophils Absolute: 0.1 10*3/uL (ref 0.0–0.1)
Basophils Relative: 1 %
Eosinophils Absolute: 0.3 10*3/uL (ref 0.0–0.5)
Eosinophils Relative: 4 %
HCT: 33.8 % — ABNORMAL LOW (ref 39.0–52.0)
Hemoglobin: 11.3 g/dL — ABNORMAL LOW (ref 13.0–17.0)
Immature Granulocytes: 1 %
Lymphocytes Relative: 13 %
Lymphs Abs: 1 10*3/uL (ref 0.7–4.0)
MCH: 34.2 pg — ABNORMAL HIGH (ref 26.0–34.0)
MCHC: 33.4 g/dL (ref 30.0–36.0)
MCV: 102.4 fL — ABNORMAL HIGH (ref 80.0–100.0)
Monocytes Absolute: 0.5 10*3/uL (ref 0.1–1.0)
Monocytes Relative: 7 %
Neutro Abs: 5.5 10*3/uL (ref 1.7–7.7)
Neutrophils Relative %: 74 %
Platelets: 264 10*3/uL (ref 150–400)
RBC: 3.3 MIL/uL — ABNORMAL LOW (ref 4.22–5.81)
RDW: 12.3 % (ref 11.5–15.5)
WBC: 7.4 10*3/uL (ref 4.0–10.5)
nRBC: 0 % (ref 0.0–0.2)

## 2021-07-25 LAB — MAGNESIUM: Magnesium: 2.3 mg/dL (ref 1.7–2.4)

## 2021-07-25 LAB — GLUCOSE, CAPILLARY
Glucose-Capillary: 124 mg/dL — ABNORMAL HIGH (ref 70–99)
Glucose-Capillary: 135 mg/dL — ABNORMAL HIGH (ref 70–99)
Glucose-Capillary: 146 mg/dL — ABNORMAL HIGH (ref 70–99)
Glucose-Capillary: 152 mg/dL — ABNORMAL HIGH (ref 70–99)
Glucose-Capillary: 193 mg/dL — ABNORMAL HIGH (ref 70–99)
Glucose-Capillary: 60 mg/dL — ABNORMAL LOW (ref 70–99)
Glucose-Capillary: 93 mg/dL (ref 70–99)

## 2021-07-25 LAB — URINE CULTURE: Culture: >=100000 — AB

## 2021-07-25 MED ORDER — FOSFOMYCIN TROMETHAMINE 3 G PO PACK
3.0000 g | PACK | Freq: Once | ORAL | Status: AC
  Administered 2021-07-25: 3 g
  Filled 2021-07-25: qty 3

## 2021-07-25 MED ORDER — BACLOFEN 10 MG PO TABS: 5.0000 mg | ORAL_TABLET | Freq: Three times a day (TID) | ORAL | 2 refills | Status: DC

## 2021-07-25 MED ORDER — DEXTROSE 50 % IV SOLN
12.5000 g | INTRAVENOUS | Status: AC
  Administered 2021-07-25: 12.5 g via INTRAVENOUS
  Filled 2021-07-25: qty 50

## 2021-07-25 NOTE — TOC Transition Note (Signed)
Transition of Care (TOC) - CM/SW Discharge Note ? ? ?Patient Details  ?Name: Obrien Huskins ?MRN: [id number] ?Date of Birth: May 10, 1945 ? ?Transition of Care (TOC) CM/SW Contact:  ?Rashod Gougeon, Marjie Skiff, RN ?Phone Number: ?07/25/2021, 12:44 PM ? ? ?Clinical Narrative:    ?Spoke with daughter [name] via phone as pt is ready to dc back home with hospice. [name] states that her brother that is in the room with pt will ride home with him in the ambulance. She was informed that EMS does not allow family to ride in the ambulance. She states that no one can come pick her brother up until 9pm when they get off work. ? ?She then comes to the hospital and states that she can not take her brother home (10 minutes away) because she has to go to work. She appeared very upset that the hospital could not solve her brother's transportation needs. When speaking with the brother in the room he states that he does not know how to take the bus and he has no money for a cab. EMS scheduled for 9pm as this is when the family will be home from work. ? ? ?  ?  ?Readmission Risk Interventions ?   ? View : No data to display.  ?  ?  ?  ? ? ? ? ? ?

## 2021-07-25 NOTE — Discharge Summary (Signed)
?Physician Discharge Summary  ? ?[name] OFB:[id number] DOB: 09-30-1945 DOA: 07/22/2021 ? ?PCP: Ross Del, DO ? ?Admit date: 07/22/2021 ?Discharge date: 07/25/2021 ? ?Admitted From: home ?Disposition:  home ?Discharging physician: Ross Dee, MD ? ?Recommendations for Outpatient Follow-up:  ?Consider comfort care as patient continues to decline and have poor QOL/functional status ? ? ?Discharge Condition: poor ?CODE STATUS: Full ?Diet recommendation:  ? ?Hospital Course: ?[name] is a 76 yo male with PMH depression/anxiety, diverticulosis, prostate cancer, BPH, HLD, dementia, GERD, chronic subdural hematoma, failure to thrive, dysphagia with PEG tube feeding dependence, severe protein calorie malnutrition who presented with a presumed change in mentation per family.  He is followed by hospice at home however remains full code due to family wishes. ?Goals of care discussions were held on admission and family was insistent on treating for underlying suspected infection then discharging patient back home as able.  They do not wish for long-term placement for patient given prior physical decline at last institution. ?Due to language barrier and underlying dementia, patient was unable to provide any collateral information on admission. ?He underwent work-up and urinalysis was notable for positive nitrite, large LE, greater than 50 WBC, rare bacteria.  He was started on [name] given a history of resistant organisms and admitted for UTI treatment.  Urine culture was noted to be positive for [name] ESBL.  He was transitioned to [name] to complete course.  He was considered to be improved back to his baseline which was already a poor quality of life.  This was again discussed with his daughter prior to discharge and he was recommended for further consideration of pursuing full comfort care with hospice on discharge back home.  His daughter stated they wished to continue current treatment. ? ?Assessment and  Plan: ? ?Urinary tract infection ?> Patient with mild change in mentation at home with increased somnolence. ?> Initial work-up was negative but urinalysis did return with protein, nitrates, leukocytes, bacteria. ?> No leukocytosis however patient has a history of resistant organisms susceptible only to [name] and [name]. ?> Started on [name] in the ED. ?- s/p [name]; course completed with [name] prior to discharge given E Young ESBL on urine culture ? ?Dementia ?Failure to thrive ?Dysphagia ?Malnutrition ?Contractures ?> On tube feeds and following with hospice as per HPI ?> Remains full code. Does follow with hospice outpatient, unclear if there would be benefit from inpatient palliative care consult. Daughter declined.  ?- Continue with tube feeds at discharge  ?- Continue midodrine and baclofen ?- Supportive care ? ?GERD ?- Continue home PPI and Pepcid ? ? ? ? ?The patient's chronic medical conditions were treated accordingly per the patient's home medication regimen except as noted.  On day of discharge, patient was felt deemed stable for discharge. Patient/family member advised to call PCP or come back to ER if needed.  ? ?Principal Diagnosis: UTI (urinary tract infection) ? ?Discharge Diagnoses: ?Principal Problem: ?  UTI (urinary tract infection) ?Active Problems: ?  BPH with urinary obstruction ?  HYPERCHOLESTEROLEMIA, MILD ?  Depression ?  Prostate cancer ([name]) ?  Anemia of chronic disease ?  Moderate dementia without behavioral disturbance ([name]) ?  FTT (failure to thrive) in adult ?  Protein calorie malnutrition ([name]) ?  Dysphagia ?  Contracture of muscle of lower extremity, bilateral ? ? ?Discharge Instructions   ? ? Increase activity slowly   Complete by: As directed ?  ? No wound care   Complete by: As directed ?  ? ?  ? ?  Allergies as of 07/25/2021   ?No Known Allergies ?  ? ?  ?Medication List  ?  ? ?STOP taking these medications   ? ?ferrous sulfate 325 (65 FE) MG tablet ?  ?guaiFENesin 100 MG/5ML  liquid ?Commonly known as: ROBITUSSIN ?  ? ?  ? ?TAKE these medications   ? ?albuterol 108 (90 Base) MCG/ACT inhaler ?Commonly known as: VENTOLIN HFA ?Inhale 2 puffs into the lungs every 4 (four) hours as needed for wheezing or shortness of breath. ?  ?baclofen 10 MG tablet ?Commonly known as: LIORESAL ?Place 0.5 tablets (5 mg total) into feeding tube 3 (three) times daily. ?  ?bisacodyl 10 MG suppository ?Commonly known as: DULCOLAX ?Place 1 suppository (10 mg total) rectally daily as needed for moderate constipation. ?  ?famotidine 20 MG tablet ?Commonly known as: PEPCID ?Place 1 tablet (20 mg total) into feeding tube 2 (two) times daily. ?  ?midodrine 2.5 MG tablet ?Commonly known as: PROAMATINE ?Place 1 tablet (2.5 mg total) into feeding tube 3 (three) times daily with meals. ?What changed: when to take this ?  ?pantoprazole 40 MG tablet ?Commonly known as: PROTONIX ?Take 1 tablet (40 mg total) by mouth daily. ?  ?Resource ThickenUp Clear Powd ?Take 120 g by mouth as needed. ?  ? ?  ? ? ?No Known Allergies ? ?Consultations: ? ? ?Procedures: ? ? ?Discharge Exam: ?BP (!) 91/54 (BP Location: Left Arm)   Pulse 91   Temp 98.4 ?F (36.9 ?C) (Oral)   Resp 18   Ht '5\' 6"'$  (1.676 m)   Wt 54 kg   SpO2 99%   BMI 19.21 kg/m?  ?Physical Exam ?Constitutional:   ?   Comments: Chronically ill-appearing frail elderly gentleman laying in bed in no distress with contracted lower extremities  ?HENT:  ?   Head: Normocephalic and atraumatic.  ?   Mouth/Throat:  ?   Mouth: Mucous membranes are moist.  ?Eyes:  ?   Extraocular Movements: Extraocular movements intact.  ?Cardiovascular:  ?   Rate and Rhythm: Normal rate and regular rhythm.  ?   Heart sounds: Normal heart sounds.  ?Pulmonary:  ?   Effort: Pulmonary effort is normal. No respiratory distress.  ?   Breath sounds: Normal breath sounds. No wheezing.  ?Abdominal:  ?   General: Bowel sounds are normal. There is no distension.  ?   Palpations: Abdomen is soft.  ?   Tenderness:  There is no abdominal tenderness.  ?   Comments: Thin abdomen.  PEG tube in place  ?Musculoskeletal:  ?   Cervical back: Normal range of motion and neck supple.  ?   Comments: Thin extremities  ?Skin: ?   General: Skin is warm and dry.  ?Neurological:  ?   Comments: Unable to follow commands  ?  ? ?The results of significant diagnostics from this hospitalization (including imaging, microbiology, ancillary and laboratory) are listed below for reference.   ?Microbiology: ?Recent Results (from the past 240 hour(s))  ?Culture, blood (Routine x 2)     Status: None (Preliminary result)  ? Collection Time: 07/22/21 12:16 PM  ? Specimen: BLOOD  ?Result Value Ref Range Status  ? Specimen Description   Final  ?  BLOOD BLOOD LEFT WRIST ?Performed at [facility], [place] [address] [place], [place] [address] ?  ? Special Requests   Final  ?  BOTTLES DRAWN AEROBIC AND ANAEROBIC Blood Culture results may not be optimal due to an excessive volume of blood  received in culture bottles ?Performed at [facility], [place] [address] [place], [place] [address] ?  ? Culture   Final  ?  NO GROWTH 3 DAYS ?Performed at [facility], [place] [address] [place], [place] [address] ?  ? Report Status PENDING  Incomplete  ?Culture, blood (Routine x 2)     Status: None (Preliminary result)  ? Collection Time: 07/22/21 12:20 PM  ? Specimen: BLOOD  ?Result Value Ref Range Status  ? Specimen Description   Final  ?  BLOOD BLOOD RIGHT FOREARM ?Performed at [facility], [place] [address], [place], [place] [address] ?  ? Special Requests   Final  ?  BOTTLES DRAWN AEROBIC AND ANAEROBIC Blood Culture adequate volume ?Performed at [facility], [place] [address], [place], [place] [address] ?  ? Culture   Final  ?  NO GROWTH 3 DAYS ?Performed at [facility], [place] [address], [place], [place] [address] ?  ? Report Status PENDING  Incomplete  ?Resp Panel by RT-PCR (Flu A&B, Covid)  Nasopharyngeal Swab     Status: None  ? Collection Time: 07/22/21 12:29 PM  ? Specimen: Nasopharyngeal Swab; Nasopharyngeal(NP) swabs in vial transport medium  ?Result Value Ref Range Status  ? SARS Coronavirus 2 by RT PCR

## 2021-07-25 NOTE — Progress Notes (Signed)
Pt CBG had a BS of 60. Hypoglycemia protocol followed, recheck was 125. Provider notified.  ?

## 2021-07-27 LAB — CULTURE, BLOOD (ROUTINE X 2)
Culture: NO GROWTH
Culture: NO GROWTH
Special Requests: ADEQUATE

## 2021-10-26 ENCOUNTER — Inpatient Hospital Stay (HOSPITAL_COMMUNITY)
Admission: EM | Admit: 2021-10-26 | Discharge: 2021-11-01 | DRG: 698 | Disposition: A | Discharge status: Hospice | Payer: Medicare Other | Attending: Internal Medicine | Admitting: Internal Medicine

## 2021-10-26 DIAGNOSIS — M24522 Contracture, left elbow: Secondary | ICD-10-CM | POA: Diagnosis present

## 2021-10-26 DIAGNOSIS — G9340 Encephalopathy, unspecified: Secondary | ICD-10-CM | POA: Diagnosis present

## 2021-10-26 DIAGNOSIS — T83511A Infection and inflammatory reaction due to indwelling urethral catheter, initial encounter: Secondary | ICD-10-CM | POA: Diagnosis present

## 2021-10-26 DIAGNOSIS — Z20822 Contact with and (suspected) exposure to covid-19: Secondary | ICD-10-CM | POA: Diagnosis present

## 2021-10-26 DIAGNOSIS — M24521 Contracture, right elbow: Secondary | ICD-10-CM | POA: Diagnosis present

## 2021-10-26 DIAGNOSIS — R64 Cachexia: Secondary | ICD-10-CM | POA: Diagnosis present

## 2021-10-26 DIAGNOSIS — E46 Unspecified protein-calorie malnutrition: Secondary | ICD-10-CM | POA: Diagnosis present

## 2021-10-26 DIAGNOSIS — G9341 Metabolic encephalopathy: Secondary | ICD-10-CM | POA: Diagnosis present

## 2021-10-26 DIAGNOSIS — N390 Urinary tract infection, site not specified: Secondary | ICD-10-CM

## 2021-10-26 DIAGNOSIS — Z8619 Personal history of other infectious and parasitic diseases: Secondary | ICD-10-CM

## 2021-10-26 DIAGNOSIS — K7200 Acute and subacute hepatic failure without coma: Secondary | ICD-10-CM | POA: Diagnosis present

## 2021-10-26 DIAGNOSIS — A419 Sepsis, unspecified organism: Secondary | ICD-10-CM

## 2021-10-26 DIAGNOSIS — Z85028 Personal history of other malignant neoplasm of stomach: Secondary | ICD-10-CM

## 2021-10-26 DIAGNOSIS — Y731 Therapeutic (nonsurgical) and rehabilitative gastroenterology and urology devices associated with adverse incidents: Secondary | ICD-10-CM | POA: Diagnosis present

## 2021-10-26 DIAGNOSIS — F03C0 Unspecified dementia, severe, without behavioral disturbance, psychotic disturbance, mood disturbance, and anxiety: Secondary | ICD-10-CM | POA: Diagnosis present

## 2021-10-26 DIAGNOSIS — R4182 Altered mental status, unspecified: Secondary | ICD-10-CM | POA: Diagnosis present

## 2021-10-26 DIAGNOSIS — M24561 Contracture, right knee: Secondary | ICD-10-CM | POA: Diagnosis present

## 2021-10-26 DIAGNOSIS — Z87891 Personal history of nicotine dependence: Secondary | ICD-10-CM | POA: Diagnosis not present

## 2021-10-26 DIAGNOSIS — R1310 Dysphagia, unspecified: Secondary | ICD-10-CM | POA: Diagnosis present

## 2021-10-26 DIAGNOSIS — R6520 Severe sepsis without septic shock: Secondary | ICD-10-CM | POA: Diagnosis present

## 2021-10-26 DIAGNOSIS — Z7401 Bed confinement status: Secondary | ICD-10-CM | POA: Diagnosis not present

## 2021-10-26 DIAGNOSIS — Z79899 Other long term (current) drug therapy: Secondary | ICD-10-CM

## 2021-10-26 DIAGNOSIS — R7989 Other specified abnormal findings of blood chemistry: Secondary | ICD-10-CM | POA: Diagnosis present

## 2021-10-26 DIAGNOSIS — Z8546 Personal history of malignant neoplasm of prostate: Secondary | ICD-10-CM

## 2021-10-26 DIAGNOSIS — Z681 Body mass index (BMI) 19 or less, adult: Secondary | ICD-10-CM | POA: Diagnosis not present

## 2021-10-26 DIAGNOSIS — M24562 Contracture, left knee: Secondary | ICD-10-CM | POA: Diagnosis present

## 2021-10-26 DIAGNOSIS — B9689 Other specified bacterial agents as the cause of diseases classified elsewhere: Secondary | ICD-10-CM | POA: Diagnosis present

## 2021-10-26 DIAGNOSIS — Z931 Gastrostomy status: Secondary | ICD-10-CM | POA: Diagnosis not present

## 2021-10-26 DIAGNOSIS — I679 Cerebrovascular disease, unspecified: Secondary | ICD-10-CM | POA: Diagnosis present

## 2021-10-26 DIAGNOSIS — R627 Adult failure to thrive: Secondary | ICD-10-CM | POA: Diagnosis present

## 2021-10-26 DIAGNOSIS — N472 Paraphimosis: Secondary | ICD-10-CM | POA: Diagnosis present

## 2021-10-26 LAB — CBC WITH DIFFERENTIAL/PLATELET
Abs Immature Granulocytes: 0.04 10*3/uL (ref 0.00–0.07)
Basophils Absolute: 0 10*3/uL (ref 0.0–0.1)
Basophils Relative: 0 %
Eosinophils Absolute: 0 10*3/uL (ref 0.0–0.5)
Eosinophils Relative: 1 %
HCT: 34.6 % — ABNORMAL LOW (ref 39.0–52.0)
Hemoglobin: 11.6 g/dL — ABNORMAL LOW (ref 13.0–17.0)
Immature Granulocytes: 1 %
Lymphocytes Relative: 9 %
Lymphs Abs: 0.8 10*3/uL (ref 0.7–4.0)
MCH: 33 pg (ref 26.0–34.0)
MCHC: 33.5 g/dL (ref 30.0–36.0)
MCV: 98.3 fL (ref 80.0–100.0)
Monocytes Absolute: 0.4 10*3/uL (ref 0.1–1.0)
Monocytes Relative: 4 %
Neutro Abs: 7.2 10*3/uL (ref 1.7–7.7)
Neutrophils Relative %: 85 %
Platelets: 350 10*3/uL (ref 150–400)
RBC: 3.52 MIL/uL — ABNORMAL LOW (ref 4.22–5.81)
RDW: 12 % (ref 11.5–15.5)
WBC: 8.5 10*3/uL (ref 4.0–10.5)
nRBC: 0 % (ref 0.0–0.2)

## 2021-10-26 MED ORDER — LACTATED RINGERS IV BOLUS (SEPSIS)
1000.0000 mL | Freq: Once | INTRAVENOUS | Status: AC
  Administered 2021-10-26: 1000 mL via INTRAVENOUS

## 2021-10-26 MED ORDER — SODIUM CHLORIDE 0.9 % IV SOLN
2.0000 g | Freq: Once | INTRAVENOUS | Status: AC
  Administered 2021-10-26: 2 g via INTRAVENOUS
  Filled 2021-10-26: qty 12.5

## 2021-10-26 MED ORDER — VANCOMYCIN HCL IN DEXTROSE 1-5 GM/200ML-% IV SOLN: 1000.0000 mg | Freq: Once | INTRAVENOUS | Status: DC

## 2021-10-26 MED ORDER — LACTATED RINGERS IV BOLUS (SEPSIS): 1000.0000 mL | Freq: Once | INTRAVENOUS | Status: DC

## 2021-10-26 MED ORDER — LACTATED RINGERS IV SOLN: INTRAVENOUS | Status: DC

## 2021-10-26 MED ORDER — LACTATED RINGERS IV BOLUS (SEPSIS): 500.0000 mL | Freq: Once | INTRAVENOUS | Status: DC

## 2021-10-26 MED ORDER — METRONIDAZOLE 500 MG/100ML IV SOLN
500.0000 mg | Freq: Once | INTRAVENOUS | Status: AC
  Administered 2021-10-26: 500 mg via INTRAVENOUS
  Filled 2021-10-26: qty 100

## 2021-10-26 MED ORDER — LACTATED RINGERS IV BOLUS (SEPSIS)
500.0000 mL | Freq: Once | INTRAVENOUS | Status: AC
  Administered 2021-10-27: 500 mL via INTRAVENOUS

## 2021-10-26 MED ORDER — SODIUM CHLORIDE 0.9 % IV SOLN
2.0000 g | INTRAVENOUS | Status: DC
  Administered 2021-10-27: 2 g via INTRAVENOUS
  Filled 2021-10-26: qty 20

## 2021-10-26 NOTE — Sepsis Progress Note (Signed)
Elink following code sepsis °

## 2021-10-26 NOTE — ED Triage Notes (Signed)
Pt BIB [facility] from home c/o CP and AMS. Per daughter pt has infection. Pt has G tube, foley cath in place, and a hospice pt. Per EMS 80/36 BP and 108 temp. LR infusing now.

## 2021-10-27 ENCOUNTER — Other Ambulatory Visit: Payer: Self-pay

## 2021-10-27 ENCOUNTER — Inpatient Hospital Stay (HOSPITAL_COMMUNITY): Payer: Medicare Other

## 2021-10-27 ENCOUNTER — Encounter (HOSPITAL_COMMUNITY): Payer: Self-pay

## 2021-10-27 ENCOUNTER — Emergency Department (HOSPITAL_COMMUNITY): Payer: Medicare Other

## 2021-10-27 DIAGNOSIS — M24522 Contracture, left elbow: Secondary | ICD-10-CM | POA: Diagnosis present

## 2021-10-27 DIAGNOSIS — G9341 Metabolic encephalopathy: Secondary | ICD-10-CM | POA: Diagnosis present

## 2021-10-27 DIAGNOSIS — Z681 Body mass index (BMI) 19 or less, adult: Secondary | ICD-10-CM | POA: Diagnosis not present

## 2021-10-27 DIAGNOSIS — N390 Urinary tract infection, site not specified: Secondary | ICD-10-CM

## 2021-10-27 DIAGNOSIS — Z87891 Personal history of nicotine dependence: Secondary | ICD-10-CM | POA: Diagnosis not present

## 2021-10-27 DIAGNOSIS — Z7401 Bed confinement status: Secondary | ICD-10-CM | POA: Diagnosis not present

## 2021-10-27 DIAGNOSIS — Z20822 Contact with and (suspected) exposure to covid-19: Secondary | ICD-10-CM | POA: Diagnosis present

## 2021-10-27 DIAGNOSIS — M24561 Contracture, right knee: Secondary | ICD-10-CM | POA: Diagnosis present

## 2021-10-27 DIAGNOSIS — F03C0 Unspecified dementia, severe, without behavioral disturbance, psychotic disturbance, mood disturbance, and anxiety: Secondary | ICD-10-CM | POA: Diagnosis present

## 2021-10-27 DIAGNOSIS — Z931 Gastrostomy status: Secondary | ICD-10-CM | POA: Diagnosis not present

## 2021-10-27 DIAGNOSIS — T83511A Infection and inflammatory reaction due to indwelling urethral catheter, initial encounter: Secondary | ICD-10-CM | POA: Diagnosis present

## 2021-10-27 DIAGNOSIS — R6520 Severe sepsis without septic shock: Secondary | ICD-10-CM | POA: Diagnosis present

## 2021-10-27 DIAGNOSIS — G9340 Encephalopathy, unspecified: Secondary | ICD-10-CM | POA: Diagnosis present

## 2021-10-27 DIAGNOSIS — A419 Sepsis, unspecified organism: Secondary | ICD-10-CM | POA: Diagnosis present

## 2021-10-27 DIAGNOSIS — R64 Cachexia: Secondary | ICD-10-CM | POA: Diagnosis present

## 2021-10-27 DIAGNOSIS — Z8546 Personal history of malignant neoplasm of prostate: Secondary | ICD-10-CM | POA: Diagnosis not present

## 2021-10-27 DIAGNOSIS — R7989 Other specified abnormal findings of blood chemistry: Secondary | ICD-10-CM | POA: Diagnosis not present

## 2021-10-27 DIAGNOSIS — R4182 Altered mental status, unspecified: Secondary | ICD-10-CM | POA: Diagnosis present

## 2021-10-27 DIAGNOSIS — R1310 Dysphagia, unspecified: Secondary | ICD-10-CM | POA: Diagnosis not present

## 2021-10-27 DIAGNOSIS — E46 Unspecified protein-calorie malnutrition: Secondary | ICD-10-CM

## 2021-10-27 DIAGNOSIS — Z8619 Personal history of other infectious and parasitic diseases: Secondary | ICD-10-CM | POA: Diagnosis not present

## 2021-10-27 DIAGNOSIS — Y731 Therapeutic (nonsurgical) and rehabilitative gastroenterology and urology devices associated with adverse incidents: Secondary | ICD-10-CM | POA: Diagnosis present

## 2021-10-27 DIAGNOSIS — M24562 Contracture, left knee: Secondary | ICD-10-CM | POA: Diagnosis present

## 2021-10-27 DIAGNOSIS — Z79899 Other long term (current) drug therapy: Secondary | ICD-10-CM | POA: Diagnosis not present

## 2021-10-27 DIAGNOSIS — B9689 Other specified bacterial agents as the cause of diseases classified elsewhere: Secondary | ICD-10-CM | POA: Diagnosis present

## 2021-10-27 DIAGNOSIS — I679 Cerebrovascular disease, unspecified: Secondary | ICD-10-CM | POA: Diagnosis present

## 2021-10-27 DIAGNOSIS — R627 Adult failure to thrive: Secondary | ICD-10-CM | POA: Diagnosis present

## 2021-10-27 DIAGNOSIS — K7200 Acute and subacute hepatic failure without coma: Secondary | ICD-10-CM | POA: Diagnosis present

## 2021-10-27 LAB — URINALYSIS, ROUTINE W REFLEX MICROSCOPIC
Bilirubin Urine: NEGATIVE
Glucose, UA: NEGATIVE mg/dL
Ketones, ur: NEGATIVE mg/dL
Nitrite: POSITIVE — AB
Protein, ur: 100 mg/dL — AB
Specific Gravity, Urine: 1.018 (ref 1.005–1.030)
WBC, UA: >50 WBC/hpf — ABNORMAL HIGH (ref 0–5)
pH: 7 (ref 5.0–8.0)

## 2021-10-27 LAB — COMPREHENSIVE METABOLIC PANEL
ALT: 111 U/L — ABNORMAL HIGH (ref 0–44)
ALT: 76 U/L — ABNORMAL HIGH (ref 0–44)
AST: 107 U/L — ABNORMAL HIGH (ref 15–41)
AST: 231 U/L — ABNORMAL HIGH (ref 15–41)
Albumin: 2 g/dL — ABNORMAL LOW (ref 3.5–5.0)
Albumin: 2.6 g/dL — ABNORMAL LOW (ref 3.5–5.0)
Alkaline Phosphatase: 212 U/L — ABNORMAL HIGH (ref 38–126)
Alkaline Phosphatase: 301 U/L — ABNORMAL HIGH (ref 38–126)
Anion gap: 6 (ref 5–15)
Anion gap: 9 (ref 5–15)
BUN: 24 mg/dL — ABNORMAL HIGH (ref 8–23)
BUN: 31 mg/dL — ABNORMAL HIGH (ref 8–23)
CO2: 23 mmol/L (ref 22–32)
CO2: 24 mmol/L (ref 22–32)
Calcium: 7.8 mg/dL — ABNORMAL LOW (ref 8.9–10.3)
Calcium: 8.5 mg/dL — ABNORMAL LOW (ref 8.9–10.3)
Chloride: 102 mmol/L (ref 98–111)
Chloride: 103 mmol/L (ref 98–111)
Creatinine, Ser: 1.02 mg/dL (ref 0.61–1.24)
Creatinine, Ser: 1.07 mg/dL (ref 0.61–1.24)
GFR, Estimated: >60 mL/min (ref >60)
GFR, Estimated: >60 mL/min (ref >60)
Glucose, Bld: 106 mg/dL — ABNORMAL HIGH (ref 70–99)
Glucose, Bld: 107 mg/dL — ABNORMAL HIGH (ref 70–99)
Potassium: 4 mmol/L (ref 3.5–5.1)
Potassium: 5 mmol/L (ref 3.5–5.1)
Sodium: 133 mmol/L — ABNORMAL LOW (ref 135–145)
Sodium: 134 mmol/L — ABNORMAL LOW (ref 135–145)
Total Bilirubin: 0.7 mg/dL (ref 0.3–1.2)
Total Bilirubin: 1.4 mg/dL — ABNORMAL HIGH (ref 0.3–1.2)
Total Protein: 4.9 g/dL — ABNORMAL LOW (ref 6.5–8.1)
Total Protein: 6.3 g/dL — ABNORMAL LOW (ref 6.5–8.1)

## 2021-10-27 LAB — CBC
HCT: 29.4 % — ABNORMAL LOW (ref 39.0–52.0)
Hemoglobin: 9.8 g/dL — ABNORMAL LOW (ref 13.0–17.0)
MCH: 32.7 pg (ref 26.0–34.0)
MCHC: 33.3 g/dL (ref 30.0–36.0)
MCV: 98 fL (ref 80.0–100.0)
Platelets: 294 10*3/uL (ref 150–400)
RBC: 3 MIL/uL — ABNORMAL LOW (ref 4.22–5.81)
RDW: 11.9 % (ref 11.5–15.5)
WBC: 9.4 10*3/uL (ref 4.0–10.5)
nRBC: 0 % (ref 0.0–0.2)

## 2021-10-27 LAB — PROTIME-INR
INR: 1 (ref 0.8–1.2)
Prothrombin Time: 13.4 seconds (ref 11.4–15.2)

## 2021-10-27 LAB — RESP PANEL BY RT-PCR (FLU A&B, COVID) ARPGX2
Influenza A by PCR: NEGATIVE
Influenza B by PCR: NEGATIVE
SARS Coronavirus 2 by RT PCR: NEGATIVE

## 2021-10-27 LAB — APTT: aPTT: 20 seconds — ABNORMAL LOW (ref 24–36)

## 2021-10-27 LAB — LACTIC ACID, PLASMA
Lactic Acid, Venous: 1 mmol/L (ref 0.5–1.9)
Lactic Acid, Venous: 1.8 mmol/L (ref 0.5–1.9)

## 2021-10-27 MED ORDER — ENOXAPARIN SODIUM 40 MG/0.4ML IJ SOSY
40.0000 mg | PREFILLED_SYRINGE | INTRAMUSCULAR | Status: DC
  Administered 2021-10-27 – 2021-10-28 (×2): 40 mg via SUBCUTANEOUS
  Filled 2021-10-27 (×2): qty 0.4

## 2021-10-27 MED ORDER — SODIUM CHLORIDE 0.9% FLUSH
3.0000 mL | Freq: Two times a day (BID) | INTRAVENOUS | Status: DC
  Administered 2021-10-27 – 2021-11-01 (×11): 3 mL via INTRAVENOUS

## 2021-10-27 MED ORDER — LACTATED RINGERS IV SOLN: INTRAVENOUS | Status: AC

## 2021-10-27 MED ORDER — BACLOFEN 10 MG PO TABS
5.0000 mg | ORAL_TABLET | Freq: Three times a day (TID) | ORAL | Status: DC
Start: 2021-10-27 — End: 2021-11-01
  Administered 2021-10-27 – 2021-11-01 (×15): 5 mg
  Filled 2021-10-27 (×16): qty 1

## 2021-10-27 MED ORDER — PANTOPRAZOLE SODIUM 40 MG PO TBEC
40.0000 mg | DELAYED_RELEASE_TABLET | Freq: Every day | ORAL | Status: DC
  Filled 2021-10-27 (×3): qty 1

## 2021-10-27 MED ORDER — ONDANSETRON HCL 4 MG PO TABS: 4.0000 mg | ORAL_TABLET | Freq: Four times a day (QID) | ORAL | Status: DC | PRN

## 2021-10-27 MED ORDER — MIDODRINE HCL 5 MG PO TABS
2.5000 mg | ORAL_TABLET | Freq: Three times a day (TID) | ORAL | Status: DC
  Administered 2021-10-27 – 2021-11-01 (×15): 2.5 mg
  Filled 2021-10-27 (×14): qty 1

## 2021-10-27 MED ORDER — LACTATED RINGERS IV BOLUS
500.0000 mL | Freq: Once | INTRAVENOUS | Status: AC
Start: 2021-10-27 — End: 2021-10-27
  Administered 2021-10-27: 500 mL via INTRAVENOUS

## 2021-10-27 MED ORDER — ONDANSETRON HCL 4 MG/2ML IJ SOLN: 4.0000 mg | Freq: Four times a day (QID) | INTRAMUSCULAR | Status: DC | PRN

## 2021-10-27 MED ORDER — FENTANYL CITRATE PF 50 MCG/ML IJ SOSY: 12.5000 ug | PREFILLED_SYRINGE | INTRAMUSCULAR | Status: DC | PRN

## 2021-10-27 MED ORDER — ALBUTEROL SULFATE (2.5 MG/3ML) 0.083% IN NEBU
2.5000 mg | INHALATION_SOLUTION | Freq: Four times a day (QID) | RESPIRATORY_TRACT | Status: DC | PRN
Start: 2021-10-27 — End: 2021-11-01

## 2021-10-27 MED ORDER — ACETAMINOPHEN 325 MG PO TABS
650.0000 mg | ORAL_TABLET | Freq: Four times a day (QID) | ORAL | Status: DC | PRN
  Administered 2021-11-01: 650 mg via ORAL
  Filled 2021-10-27: qty 2

## 2021-10-27 MED ORDER — ACETAMINOPHEN 650 MG RE SUPP: 650.0000 mg | Freq: Four times a day (QID) | RECTAL | Status: DC | PRN

## 2021-10-27 MED ORDER — SODIUM CHLORIDE 0.9 % IV SOLN
1.0000 g | Freq: Two times a day (BID) | INTRAVENOUS | Status: AC
  Administered 2021-10-27 – 2021-10-31 (×10): 1 g via INTRAVENOUS
  Filled 2021-10-27 (×12): qty 20

## 2021-10-27 NOTE — ED Notes (Signed)
Palliative care at bedside.

## 2021-10-27 NOTE — Progress Notes (Addendum)
Progress Note   Patient: [name] PTW:[id number] DOB: 1945/04/25 DOA: 10/26/2021     0 DOS: the patient was seen and examined on 10/27/2021   Brief hospital course: 76 y.o. M h/o depression, anxiety, prostate cancer, failure to thrive, chronic Foley catheter, and dysphagia with feeding tube dependence who is under the care of hospice but remains full code per family wishes and presented to ED on 8/13 for evalution of AMS and cloudy urine per family. Per family patient normally conversant and able to express needs but incorherent, not communicative. 8/14 low BP noted without tachycardia/fevers, continues to remain AMS, family in room, unable to exchange foley catheter   Assessment and Plan:  1. Severe sepsis secondary to UTI  - Presents with several days of AMS and cloudy urine, and found to be febrile and tachycardic with UA suggestive of infection  Presented earlier this year in May 2023 for similar presentation - He has hx of ESBL infections  - Blood and urine cultures were collected in ED and antibiotics were started  - Treat with meropenem for now, follow cultures and clinical course  - attempted to repalace foley given concern for chronic indwelling foley with acute UTI/sepsis secondary to UTI, however unable to replace foley given swelling around head of penis. I have requested help from urology as well to assist with bladder decompressoin given his chronic debility.    2. Acute encephalopathy  - Pt normally conversant but not talking in several days per his daughter  - Likely from acute infection  - Expand workup if fails to improve as expected with treatment of infection    3. Dysphagia; FTT; malnutrition  - Dependent on feeding tube, cachectic, contracted  - Dietary consulted  - Hospice consulted here to ensure patient care is per family wishes   4. Elevated LFTs  - Alk phos 301, AST 231, ALT 111, and total bili 1.4 on admission, were normal in May 2023  - Abd exam benign  -  Check RUQ [name] and viral hepatitis, trend      DVT prophylaxis: Lovenox  Code Status: Full, discussed with daughter in ED  Level of Care: Level of care: Progressive Family Communication: Daughter at bedside  Disposition Plan:  Patient is from: home  Anticipated d/c is to: TBD Anticipated d/c date is: 10/30/21  Patient currently: Pending cultures, improvement in sepsis parameters  Consults called: none  Admission status: Inpatient       Subjective:    Physical Exam: Vitals:   10/27/21 0945 10/27/21 1015 10/27/21 1043 10/27/21 1045  BP: (!) 88/58 (!) 86/60  (!) 84/58  Pulse: (!) 109 79  77  Resp: '13 19  17  ' Temp:   97.6 F (36.4 C)   TempSrc:   Oral   SpO2: 98% 97%  98%  Weight:      Height:       Constitutional: No respiratory distress, no pallor or cyanosis   Eyes: PERTLA, lids and conjunctivae normal ENMT: Mucous membranes are dry. Posterior pharynx clear of any exudate or lesions.   Neck: supple, no masses  Respiratory: occasional wheeze, no crackles. No accessory muscle use.  Cardiovascular: Rate ~100 and regular. No extremity edema.  Abdomen: No distension, no tenderness, soft. Bowel sounds active.  Musculoskeletal: no clubbing / cyanosis. Flexion contractures.   Skin: no significant rashes, lesions, ulcers. Warm, dry, well-perfused. Neurologic: No gross facial asymmetry. Contracted. Awake but not answering basic questions.    Data Reviewed:  There are no new  results to review at this time.  Family Communication: family at bedside  Disposition: Status is: Inpatient Remains inpatient appropriate because: AMS, sepsis, UTI  Planned Discharge Destination: Home    Time spent: 25 minutes  Author: Vanna Scotland, MD 10/27/2021 12:20 PM  For on call review [email or web address].

## 2021-10-27 NOTE — Consult Note (Signed)
Urology Consult   Physician requesting consult: Dr. Alanda Amass  Reason for consult: Difficult urethral catheterization  History of Present Illness: [name] is a 76 y.o. previously followed by Dr. Alyson Ingles for his prostate cancer with a h/o depression, anxiety, prostate cancer, failure to thrive, chronic Foley catheter, and dysphagia with feeding tube dependence who is under the care of hospice but remains full code per family wishes and presented to ED on 8/13 for evalution of AMS and cloudy urine per family. Per family patient normally conversant and able to express needs but incorherent, not communicative.  8/14 low BP noted without tachycardia/fevers, continues to remain AMS, family in room, unable to exchange foley catheter per nursing staff.  He is in the process of being admitted due to sepsis likely from a UTI.  Past Medical History:  Diagnosis Date   Adenomatous polyp of colon 06/2010   4 polyps removed at colonoscopy by Dr Henrene Pastor   Anxiety    Chronic headaches    Depression    Diverticulosis of colon 08/2010   Internal hemorrhoids 08/2010   Prostate cancer [facility])    receiving radiation treatment   Subdural hematoma, chronic ([place])    Tuberculosis     Past Surgical History:  Procedure Laterality Date   BIOPSY  01/29/2020   Procedure: BIOPSY;  Surgeon: Irene Shipper, MD;  Location: [facility] ENDOSCOPY;  Service: Endoscopy;;   COLONOSCOPY WITH PROPOFOL N/A 01/29/2020   Procedure: COLONOSCOPY WITH PROPOFOL;  Surgeon: Irene Shipper, MD;  Location: [place];  Service: Endoscopy;  Laterality: N/A;   ESOPHAGOGASTRODUODENOSCOPY (EGD) WITH PROPOFOL N/A 01/29/2020   Procedure: ESOPHAGOGASTRODUODENOSCOPY (EGD) WITH PROPOFOL;  Surgeon: Irene Shipper, MD;  Location: [facility] ENDOSCOPY;  Service: Endoscopy;  Laterality: N/A;   FLEXIBLE SIGMOIDOSCOPY  03/29/2012   Procedure: FLEXIBLE SIGMOIDOSCOPY;  Surgeon: Inda Castle, MD;  Location: [place];  Service: Endoscopy;  Laterality: N/A;  possibly may do  colonoscopy but prep is only for a flex   FLEXIBLE SIGMOIDOSCOPY  03/31/2012   Procedure: FLEXIBLE SIGMOIDOSCOPY;  Surgeon: Inda Castle, MD;  Location: [place];  Service: Endoscopy;  Laterality: N/A;   HEMORRHOID BANDING  03/31/2012   Procedure: HEMORRHOID BANDING;  Surgeon: Inda Castle, MD;  Location: [place];  Service: Endoscopy;  Laterality: N/A;   HOT HEMOSTASIS N/A 01/29/2020   Procedure: HOT HEMOSTASIS (ARGON PLASMA COAGULATION/BICAP);  Surgeon: Irene Shipper, MD;  Location: [facility] ENDOSCOPY;  Service: Endoscopy;  Laterality: N/A;   MULTIPLE EXTRACTIONS WITH ALVEOLOPLASTY N/A 08/08/2013   Procedure: MULTIPLE EXTRACTION OF TEETH #1, 2, 3, 6, 7, 9, 11, 15, 16, 17, 20, 21, 22, 23, 24, 26, 29, 30, 32 WITH ALVEOLOPLASTY AND REMOVAL BUCCAL EXOSTOSIS LEFT MAXILLA;  Surgeon: Gae Bon, DDS;  Location: [place];  Service: Oral Surgery;  Laterality: N/A;   shrapnel removal     skull during [place]    Medications:  Home meds:  No current facility-administered medications on file prior to encounter.   Current Outpatient Medications on File Prior to Encounter  Medication Sig Dispense Refill   baclofen (LIORESAL) 10 MG tablet Place 0.5 tablets (5 mg total) into feeding tube 3 (three) times daily. 45 each 2   GERI-TUSSIN DM 10-100 MG/5ML liquid Take 5 mLs by mouth every 4 (four) hours as needed (cough).     albuterol (VENTOLIN HFA) 108 (90 Base) MCG/ACT inhaler Inhale 2 puffs into the lungs every 4 (four) hours as needed for wheezing or shortness of breath. (Patient not taking:  Reported on 07/24/2021) 18 g 0   famotidine (PEPCID) 20 MG tablet Place 1 tablet (20 mg total) into feeding tube 2 (two) times daily. (Patient not taking: Reported on 07/24/2021) 60 tablet 0   midodrine (PROAMATINE) 2.5 MG tablet Place 1 tablet (2.5 mg total) into feeding tube 3 (three) times daily with meals. (Patient not taking: Reported on 10/27/2021) 90 tablet 0     Scheduled Meds:  baclofen  5 mg Per  Tube TID   enoxaparin (LOVENOX) injection  40 mg Subcutaneous Q24H   midodrine  2.5 mg Per Tube TID WC   pantoprazole  40 mg Oral Daily   sodium chloride flush  3 mL Intravenous Q12H   Continuous Infusions:  lactated ringers 75 mL/hr at 10/27/21 0508   meropenem (MERREM) IV Stopped (10/27/21 0550)   PRN Meds:.acetaminophen **OR** acetaminophen, albuterol, fentaNYL (SUBLIMAZE) injection, ondansetron **OR** ondansetron (ZOFRAN) IV  Allergies: No Known Allergies  History reviewed. No pertinent family history.  Social History:  reports that he quit smoking about 29 years ago. His smoking use included cigarettes. He has a 1.25 pack-year smoking history. He has never used smokeless tobacco. He reports that he does not drink alcohol and does not use drugs.  ROS: A complete review of systems was performed.  All systems are negative except for pertinent findings as noted.  Physical Exam:  Vital signs in last 24 hours: Temp:  [97.3 F (36.3 C)-102.9 F (39.4 C)] 97.8 F (36.6 C) (08/14 1431) Pulse Rate:  [71-130] 74 (08/14 1415) Resp:  [13-26] 19 (08/14 1415) BP: (80-108)/(45-69) 90/55 (08/14 1415) SpO2:  [93 %-100 %] 98 % (08/14 1415) Weight:  [41.3 kg] 41.3 kg (08/13 2248) Constitutional:  Somnolent, responsive to pain, he is severely contorted with his legs drawn up to his body Cardiovascular: Regular rate and rhythm, No JVD Respiratory: Normal respiratory effort GI: Abdomen is soft, nondistended Genitourinary: No CVAT. He has moderate edema around his distal penile skin consistent with paraphimosis.  Glans penis has mild urethral erosion from chronic catheter. Penis is tender on palpation.   Laboratory Data:  Recent Labs    10/26/21 2343 10/27/21 0515  WBC 8.5 9.4  HGB 11.6* 9.8*  HCT 34.6* 29.4*  PLT 350 294    Recent Labs    10/26/21 2343 10/27/21 0515  NA 134* 133*  K 5.0 4.0  CL 102 103  GLUCOSE 106* 107*  BUN 31* 24*  CALCIUM 8.5* 7.8*  CREATININE 1.07 1.02      Results for orders placed or performed during the hospital encounter of 10/26/21 (from the past 24 hour(s))  Lactic acid, plasma     Status: None   Collection Time: 10/26/21 11:43 PM  Result Value Ref Range   Lactic Acid, Venous 1.8 0.5 - 1.9 mmol/L  Comprehensive metabolic panel     Status: Abnormal   Collection Time: 10/26/21 11:43 PM  Result Value Ref Range   Sodium 134 (L) 135 - 145 mmol/L   Potassium 5.0 3.5 - 5.1 mmol/L   Chloride 102 98 - 111 mmol/L   CO2 23 22 - 32 mmol/L   Glucose, Bld 106 (H) 70 - 99 mg/dL   BUN 31 (H) 8 - 23 mg/dL   Creatinine, Ser 1.07 0.61 - 1.24 mg/dL   Calcium 8.5 (L) 8.9 - 10.3 mg/dL   Total Protein 6.3 (L) 6.5 - 8.1 g/dL   Albumin 2.6 (L) 3.5 - 5.0 g/dL   AST 231 (H) 15 - 41 U/L  ALT 111 (H) 0 - 44 U/L   Alkaline Phosphatase 301 (H) 38 - 126 U/L   Total Bilirubin 1.4 (H) 0.3 - 1.2 mg/dL   GFR, Estimated >60 >60 mL/min   Anion gap 9 5 - 15  CBC with Differential     Status: Abnormal   Collection Time: 10/26/21 11:43 PM  Result Value Ref Range   WBC 8.5 4.0 - 10.5 K/uL   RBC 3.52 (L) 4.22 - 5.81 MIL/uL   Hemoglobin 11.6 (L) 13.0 - 17.0 g/dL   HCT 34.6 (L) 39.0 - 52.0 %   MCV 98.3 80.0 - 100.0 fL   MCH 33.0 26.0 - 34.0 pg   MCHC 33.5 30.0 - 36.0 g/dL   RDW 12.0 11.5 - 15.5 %   Platelets 350 150 - 400 K/uL   nRBC 0.0 0.0 - 0.2 %   Neutrophils Relative % 85 %   Neutro Abs 7.2 1.7 - 7.7 K/uL   Lymphocytes Relative 9 %   Lymphs Abs 0.8 0.7 - 4.0 K/uL   Monocytes Relative 4 %   Monocytes Absolute 0.4 0.1 - 1.0 K/uL   Eosinophils Relative 1 %   Eosinophils Absolute 0.0 0.0 - 0.5 K/uL   Basophils Relative 0 %   Basophils Absolute 0.0 0.0 - 0.1 K/uL   Immature Granulocytes 1 %   Abs Immature Granulocytes 0.04 0.00 - 0.07 K/uL  Protime-INR     Status: None   Collection Time: 10/26/21 11:43 PM  Result Value Ref Range   Prothrombin Time 13.4 11.4 - 15.2 seconds   INR 1.0 0.8 - 1.2  APTT     Status: Abnormal   Collection Time:  10/26/21 11:43 PM  Result Value Ref Range   aPTT 20 (L) 24 - 36 seconds  Resp Panel by RT-PCR (Flu A&B, Covid) Anterior Nasal Swab     Status: None   Collection Time: 10/26/21 11:54 PM   Specimen: Anterior Nasal Swab  Result Value Ref Range   SARS Coronavirus 2 by RT PCR NEGATIVE NEGATIVE   Influenza A by PCR NEGATIVE NEGATIVE   Influenza B by PCR NEGATIVE NEGATIVE  Urinalysis, Routine w reflex microscopic Urine, Catheterized     Status: Abnormal   Collection Time: 10/26/21 11:54 PM  Result Value Ref Range   Color, Urine AMBER (A) YELLOW   APPearance CLOUDY (A) CLEAR   Specific Gravity, Urine 1.018 1.005 - 1.030   pH 7.0 5.0 - 8.0   Glucose, UA NEGATIVE NEGATIVE mg/dL   Hgb urine dipstick SMALL (A) NEGATIVE   Bilirubin Urine NEGATIVE NEGATIVE   Ketones, ur NEGATIVE NEGATIVE mg/dL   Protein, ur 100 (A) NEGATIVE mg/dL   Nitrite POSITIVE (A) NEGATIVE   Leukocytes,Ua LARGE (A) NEGATIVE   RBC / HPF 6-10 0 - 5 RBC/hpf   WBC, UA >50 (H) 0 - 5 WBC/hpf   Bacteria, UA MANY (A) NONE SEEN  Lactic acid, plasma     Status: None   Collection Time: 10/27/21  5:15 AM  Result Value Ref Range   Lactic Acid, Venous 1.0 0.5 - 1.9 mmol/L  Comprehensive metabolic panel     Status: Abnormal   Collection Time: 10/27/21  5:15 AM  Result Value Ref Range   Sodium 133 (L) 135 - 145 mmol/L   Potassium 4.0 3.5 - 5.1 mmol/L   Chloride 103 98 - 111 mmol/L   CO2 24 22 - 32 mmol/L   Glucose, Bld 107 (H) 70 - 99 mg/dL   BUN 24 (  H) 8 - 23 mg/dL   Creatinine, Ser 1.02 0.61 - 1.24 mg/dL   Calcium 7.8 (L) 8.9 - 10.3 mg/dL   Total Protein 4.9 (L) 6.5 - 8.1 g/dL   Albumin 2.0 (L) 3.5 - 5.0 g/dL   AST 107 (H) 15 - 41 U/L   ALT 76 (H) 0 - 44 U/L   Alkaline Phosphatase 212 (H) 38 - 126 U/L   Total Bilirubin 0.7 0.3 - 1.2 mg/dL   GFR, Estimated >60 >60 mL/min   Anion gap 6 5 - 15  CBC     Status: Abnormal   Collection Time: 10/27/21  5:15 AM  Result Value Ref Range   WBC 9.4 4.0 - 10.5 K/uL   RBC 3.00 (L)  4.22 - 5.81 MIL/uL   Hemoglobin 9.8 (L) 13.0 - 17.0 g/dL   HCT 29.4 (L) 39.0 - 52.0 %   MCV 98.0 80.0 - 100.0 fL   MCH 32.7 26.0 - 34.0 pg   MCHC 33.3 30.0 - 36.0 g/dL   RDW 11.9 11.5 - 15.5 %   Platelets 294 150 - 400 K/uL   nRBC 0.0 0.0 - 0.2 %   Recent Results (from the past 240 hour(s))  Resp Panel by RT-PCR (Flu A&B, Covid) Anterior Nasal Swab     Status: None   Collection Time: 10/26/21 11:54 PM   Specimen: Anterior Nasal Swab  Result Value Ref Range Status   SARS Coronavirus 2 by RT PCR NEGATIVE NEGATIVE Final    Comment: (NOTE) SARS-CoV-2 target nucleic acids are NOT DETECTED.  The SARS-CoV-2 RNA is generally detectable in upper respiratory specimens during the acute phase of infection. The lowest concentration of SARS-CoV-2 viral copies this assay can detect is 138 copies/mL. A negative result does not preclude SARS-Cov-2 infection and should not be used as the sole basis for treatment or other patient management decisions. A negative result may occur with  improper specimen collection/handling, submission of specimen other than nasopharyngeal swab, presence of viral mutation(s) within the areas targeted by this assay, and inadequate number of viral copies(<138 copies/mL). A negative result must be combined with clinical observations, patient history, and epidemiological information. The expected result is Negative.  Fact Sheet for Patients:  EntrepreneurPulse.com.au  Fact Sheet for Healthcare Providers:  IncredibleEmployment.be  This test is no t yet approved or cleared by the [place] FDA and  has been authorized for detection and/or diagnosis of SARS-CoV-2 by FDA under an Emergency Use Authorization (EUA). This EUA will remain  in effect (meaning this test can be used) for the duration of the COVID-19 declaration under Section 564(b)(1) of the Act, 21 U.S.C.section 360bbb-3(b)(1), unless the authorization is terminated   or revoked sooner.       Influenza A by PCR NEGATIVE NEGATIVE Final   Influenza B by PCR NEGATIVE NEGATIVE Final    Comment: (NOTE) The Xpert Xpress SARS-CoV-2/FLU/RSV plus assay is intended as an aid in the diagnosis of influenza from Nasopharyngeal swab specimens and should not be used as a sole basis for treatment. Nasal washings and aspirates are unacceptable for Xpert Xpress SARS-CoV-2/FLU/RSV testing.  Fact Sheet for Patients: EntrepreneurPulse.com.au  Fact Sheet for Healthcare Providers: IncredibleEmployment.be  This test is not yet approved or cleared by the [place] FDA and has been authorized for detection and/or diagnosis of SARS-CoV-2 by FDA under an Emergency Use Authorization (EUA). This EUA will remain in effect (meaning this test can be used) for the duration of the COVID-19 declaration under Section 564(b)(1)  of the Act, 21 U.S.C. section 360bbb-3(b)(1), unless the authorization is terminated or revoked.  Performed at [facility], [place] [address], [place], [place] [address]     Renal Function: Recent Labs    10/26/21 2343 10/27/21 0515  CREATININE 1.07 1.02   Estimated Creatinine Clearance: 37.1 mL/min (by C-G formula based on SCr of 1.02 mg/dL).  Radiologic Imaging: US Abdomen Limited RUQ (LIVER/GB)  Result Date: 10/27/2021 CLINICAL DATA:  Severe sepsis with elevated liver function tests. EXAM: ULTRASOUND ABDOMEN LIMITED RIGHT UPPER QUADRANT COMPARISON:  None Available. FINDINGS: Gallbladder: No gallstones or wall thickening visualized (2.6 mm). Tiny echogenic foci with associated comet tail artifact are seen within a portion of the gallbladder wall. The presence or absence of a sonographic Murphy's sign cannot be determined secondary to limitations of the study. Common bile duct: Diameter: 2.3 mm Liver: Markedly limited in evaluation without evidence of focal lesions. Within normal limits in parenchymal  echogenicity. Portal vein is patent on color Doppler imaging with normal direction of blood flow towards the liver. Other: It should be noted that the study is limited secondary to the patient's condition (patient is contracted with knees up to his chest). IMPRESSION: 1. Markedly limited study, as described above, without evidence of cholelithiasis. 2. 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 Electronically Signed   By: Virgina Norfolk M.D.   On: 10/27/2021 05:08   DG Chest 1 View  Result Date: 10/27/2021 CLINICAL DATA:  Sepsis EXAM: CHEST  1 VIEW COMPARISON:  Radiographs 07/22/2021 FINDINGS: Hazy airspace opacities in the left lower lung are new since 07/22/2021. The left costophrenic angle is not included in the image. No definite pleural effusion. No pneumothorax.Stable cardiomediastinal silhouette. Prominent central pulmonary arteries. Aortic calcification. IMPRESSION: New left lower lung infiltrates could be due to pneumonia versus atelectasis. Aortic Atherosclerosis (ICD10-I70.0). Electronically Signed   By: Placido Sou M.D.   On: 10/27/2021 00:49    I independently reviewed the above imaging studies.  Procedures: Paraphimosis was reduced with minimal difficulty.  Under sterile conditions, I placed a 16 Fr Foley catheter without resistance and return of clear urine.  Impression/Recommendation 1) Difficult urethral catheterization in chronically Foley-dependent man on palliative care: New indwelling 16 Fr catheter placed without complications.  Continue monthly changes at nursing facility following discharge. 2) Paraphimosis: Reduced.  Please call Urology service if any further questions or assistance is needed.  Dutch Gray 10/27/2021, 3:43 PM    Pryor Curia MD  CC: Dr. Alanda Amass

## 2021-10-27 NOTE — Hospital Course (Signed)
76 y.o. M h/o depression, anxiety, prostate cancer, failure to thrive, chronic Foley catheter, and dysphagia with feeding tube dependence who is under the care of hospice but remains full code per family wishes and presented to ED on 8/13 for evalution of AMS and cloudy urine per family. Per family patient normally conversant and able to express needs but incorherent, not communicative. 8/14 low BP noted without tachycardia/fevers, continues to remain AMS, family in room, unable to exchange foley catheter 8/15 slightly improved mentation but not yet conversant, weak cough, + congestion. SLP to see, Hospice continuing to follow. UCX mixed results, will resend, continue IV abx for now. Foley replaced by Urology 8/14

## 2021-10-27 NOTE — ED Notes (Signed)
MD Alanda Amass made aware of low BP.  07'M systolic trending throughout the night or sometimes lower into 70's with night shift RN.  MD at bedside.  Due to patient baseline being around 226 systolic MD states currently LR at 17m/hr enough at this time.  No new orders.

## 2021-10-27 NOTE — ED Notes (Signed)
Pt BP 87/57, Opyd MD paged

## 2021-10-27 NOTE — ED Provider Notes (Signed)
[facility] EMERGENCY DEPARTMENT Provider Note   CSN: [id number] Arrival date & time: 10/26/21  2237     History  Chief Complaint  Patient presents with   Chest Pain   Altered Mental Status    [name] is a 76 y.o. male.  76 yo M w/ h/o stomach cancer and contractures who presents to the ED for low bp and altered mental status his daughter. Apparently has had UTI's in the past with similar presentations.    Chest Pain Associated symptoms: altered mental status   Altered Mental Status      Home Medications Prior to Admission medications   Medication Sig Start Date End Date Taking? Authorizing Provider  albuterol (VENTOLIN HFA) 108 (90 Base) MCG/ACT inhaler Inhale 2 puffs into the lungs every 4 (four) hours as needed for wheezing or shortness of breath. Patient not taking: Reported on 07/24/2021 07/04/21   Quintella Reichert, MD  baclofen (LIORESAL) 10 MG tablet Place 0.5 tablets (5 mg total) into feeding tube 3 (three) times daily. 07/25/21   Dwyane Dee, MD  bisacodyl (DULCOLAX) 10 MG suppository Place 1 suppository (10 mg total) rectally daily as needed for moderate constipation. Patient not taking: Reported on 07/24/2021 07/04/21   Quintella Reichert, MD  famotidine (PEPCID) 20 MG tablet Place 1 tablet (20 mg total) into feeding tube 2 (two) times daily. Patient not taking: Reported on 07/24/2021 07/04/21   Quintella Reichert, MD  Maltodextrin-Xanthan Gum (RESOURCE THICKENUP CLEAR) POWD Take 120 g by mouth as needed. Patient not taking: Reported on 07/24/2021 02/02/20   Armando Reichert, MD  midodrine (PROAMATINE) 2.5 MG tablet Place 1 tablet (2.5 mg total) into feeding tube 3 (three) times daily with meals. Patient taking differently: Place 2.5 mg into feeding tube 3 (three) times daily. 07/04/21   Quintella Reichert, MD  pantoprazole (PROTONIX) 40 MG tablet Take 1 tablet (40 mg total) by mouth daily. Patient not taking: Reported on 07/24/2021 02/03/20   Armando Reichert, MD       Allergies    Patient has no known allergies.    Review of Systems   Review of Systems  Cardiovascular:  Positive for chest pain.    Physical Exam Updated Vital Signs BP (!) 81/58   Pulse 86   Temp (!) 97.3 F (36.3 C) (Oral)   Resp 17   Ht '5\' 1"'$  (1.549 m)   Wt 41.3 kg   SpO2 99%   BMI 17.19 kg/m  Physical Exam Vitals and nursing note reviewed.  Constitutional:      Appearance: He is well-developed.  HENT:     Head: Normocephalic and atraumatic.  Cardiovascular:     Rate and Rhythm: Normal rate.  Pulmonary:     Effort: Pulmonary effort is normal. Tachypnea present. No respiratory distress.     Breath sounds: Decreased breath sounds present.  Abdominal:     General: There is no distension.  Musculoskeletal:        General: Normal range of motion.     Cervical back: Normal range of motion.  Skin:    General: Skin is warm and dry.     Comments: Skin ulcer on left hand, elbow, hip  Neurological:     General: No focal deficit present.     Mental Status: He is alert.     ED Results / Procedures / Treatments   Labs (all labs ordered are listed, but only abnormal results are displayed) Labs Reviewed  COMPREHENSIVE METABOLIC PANEL - Abnormal; Notable for  the following components:      Result Value   Sodium 134 (*)    Glucose, Bld 106 (*)    BUN 31 (*)    Calcium 8.5 (*)    Total Protein 6.3 (*)    Albumin 2.6 (*)    AST 231 (*)    ALT 111 (*)    Alkaline Phosphatase 301 (*)    Total Bilirubin 1.4 (*)    All other components within normal limits  CBC WITH DIFFERENTIAL/PLATELET - Abnormal; Notable for the following components:   RBC 3.52 (*)    Hemoglobin 11.6 (*)    HCT 34.6 (*)    All other components within normal limits  URINALYSIS, ROUTINE W REFLEX MICROSCOPIC - Abnormal; Notable for the following components:   Color, Urine AMBER (*)    APPearance CLOUDY (*)    Hgb urine dipstick SMALL (*)    Protein, ur 100 (*)    Nitrite POSITIVE (*)     Leukocytes,Ua LARGE (*)    WBC, UA >50 (*)    Bacteria, UA MANY (*)    All other components within normal limits  APTT - Abnormal; Notable for the following components:   aPTT 20 (*)    All other components within normal limits  COMPREHENSIVE METABOLIC PANEL - Abnormal; Notable for the following components:   Sodium 133 (*)    Glucose, Bld 107 (*)    BUN 24 (*)    Calcium 7.8 (*)    Total Protein 4.9 (*)    Albumin 2.0 (*)    AST 107 (*)    ALT 76 (*)    Alkaline Phosphatase 212 (*)    All other components within normal limits  CBC - Abnormal; Notable for the following components:   RBC 3.00 (*)    Hemoglobin 9.8 (*)    HCT 29.4 (*)    All other components within normal limits  RESP PANEL BY RT-PCR (FLU A&B, COVID) [id number]  CULTURE, BLOOD (ROUTINE X 2)  CULTURE, BLOOD (ROUTINE X 2)  URINE CULTURE  LACTIC ACID, PLASMA  LACTIC ACID, PLASMA  PROTIME-INR  HEPATITIS PANEL, ACUTE    EKG None  Radiology US Abdomen Limited RUQ (LIVER/GB)  Result Date: 10/27/2021 CLINICAL DATA:  Severe sepsis with elevated liver function tests. EXAM: ULTRASOUND ABDOMEN LIMITED RIGHT UPPER QUADRANT COMPARISON:  None Available. FINDINGS: Gallbladder: No gallstones or wall thickening visualized (2.6 mm). Tiny echogenic foci with associated comet tail artifact are seen within a portion of the gallbladder wall. The presence or absence of a sonographic Murphy's sign cannot be determined secondary to limitations of the study. Common bile duct: Diameter: 2.3 mm Liver: Markedly limited in evaluation without evidence of focal lesions. Within normal limits in parenchymal echogenicity. Portal vein is patent on color Doppler imaging with normal direction of blood flow towards the liver. Other: It should be noted that the study is limited secondary to the patient's condition (patient is contracted with knees up to his chest). IMPRESSION: 1. Markedly limited study, as described above, without evidence of  cholelithiasis. 2. 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 Electronically Signed   By: Virgina Norfolk M.D.   On: 10/27/2021 05:08   DG Chest 1 View  Result Date: 10/27/2021 CLINICAL DATA:  Sepsis EXAM: CHEST  1 VIEW COMPARISON:  Radiographs 07/22/2021 FINDINGS: Hazy airspace opacities in the left lower lung are new since 07/22/2021. The left costophrenic angle is not included in the image. No definite pleural effusion. No pneumothorax.Stable cardiomediastinal silhouette.  Prominent central pulmonary arteries. Aortic calcification. IMPRESSION: New left lower lung infiltrates could be due to pneumonia versus atelectasis. Aortic Atherosclerosis (ICD10-I70.0). Electronically Signed   By: Placido Sou M.D.   On: 10/27/2021 00:49    Procedures Procedures    Medications Ordered in ED Medications  lactated ringers infusion ( Intravenous New Bag/Given 10/27/21 0508)  midodrine (PROAMATINE) tablet 2.5 mg (has no administration in time range)  pantoprazole (PROTONIX) EC tablet 40 mg (has no administration in time range)  baclofen (LIORESAL) tablet 5 mg (has no administration in time range)  enoxaparin (LOVENOX) injection 40 mg (has no administration in time range)  sodium chloride flush (NS) 0.9 % injection 3 mL (3 mLs Intravenous Given 10/27/21 0513)  acetaminophen (TYLENOL) tablet 650 mg (has no administration in time range)    Or  acetaminophen (TYLENOL) suppository 650 mg (has no administration in time range)  ondansetron (ZOFRAN) tablet 4 mg (has no administration in time range)    Or  ondansetron (ZOFRAN) injection 4 mg (has no administration in time range)  meropenem (MERREM) 1 g in sodium chloride 0.9 % 100 mL IVPB (0 g Intravenous Stopped 10/27/21 0550)  fentaNYL (SUBLIMAZE) injection 12.5-25 mcg (has no administration in time range)  albuterol (PROVENTIL) (2.5 MG/3ML) 0.083% nebulizer solution 2.5 mg (has no administration in time range)  lactated ringers bolus 1,000  mL (0 mLs Intravenous Stopped 10/27/21 0059)    And  lactated ringers bolus 500 mL (0 mLs Intravenous Stopped 10/27/21 0312)  ceFEPIme (MAXIPIME) 2 g in sodium chloride 0.9 % 100 mL IVPB (0 g Intravenous Stopped 10/26/21 2356)  metroNIDAZOLE (FLAGYL) IVPB 500 mg (0 mg Intravenous Stopped 10/27/21 0039)  lactated ringers bolus 500 mL (500 mLs Intravenous New Bag/Given 10/27/21 4098)    ED Course/ Medical Decision Making/ A&P                           Medical Decision Making Amount and/or Complexity of Data Reviewed Labs: ordered. ECG/medicine tests: ordered.  Risk Decision regarding hospitalization.   Sepsis with low BP. Improved with fluids. Suspect UTI vs cellulitis, rocephin started. Will admit to medicine.   Final Clinical Impression(s) / ED Diagnoses Final diagnoses:  Sepsis, due to unspecified organism, unspecified whether acute organ dysfunction present [facility])  Urinary tract infection without hematuria, site unspecified    Rx / DC Orders ED Discharge Orders     None         Tipton Ballow, Corene Cornea, MD 10/27/21 [phone]

## 2021-10-27 NOTE — Progress Notes (Signed)
Brief Palliative Medicine Progress Note:  PMT consult received and chart reviewed.   Noted [name] is a current hospice patient with [facility] I spoke with [facility] liaison, [name], who is aware the patient is in the ED. An [facility] representative has already been to the hospital and discussed [name] as they have an established relationship with [name] and family. [facility] does not feel PMT involvement is needed at this time and patient will likely be GIP admission.  [facility] will reach out to PMT directly if any needs arise.   Thank you for allowing PMT to assist in the care of this patient.  Raeley Gilmore M. Tamala Julian [facility] Palliative Medicine Team Team Phone: [phone] NO CHARGE

## 2021-10-27 NOTE — ED Notes (Signed)
EDP notified of critical aPTT lab.

## 2021-10-27 NOTE — ED Notes (Signed)
This RN and NT both have attempted to insert urethral foley catheter without success.  MD Alanda Amass made aware.  No new orders at this time.

## 2021-10-27 NOTE — Progress Notes (Signed)
Pharmacy Antibiotic Note  [name] is a 76 y.o. male with chronic foley and h/o ESBL infections admitted on 10/26/2021 with AMS, possible UTI.  Pharmacy has been consulted for Meropenem dosing.  Plan: Meropenem 1 g IV q12h  Height: '5\' 1"'$  (154.9 cm) Weight: 41.3 kg (91 lb) IBW/kg (Calculated) : 52.3  Temp (24hrs), Avg:100.4 F (38 C), Min:97.9 F (36.6 C), Max:102.9 F (39.4 C)  Recent Labs  Lab 10/26/21 2343  WBC 8.5  CREATININE 1.07  LATICACIDVEN 1.8    Estimated Creatinine Clearance: 35.4 mL/min (by C-G formula based on SCr of 1.07 mg/dL).    No Known Allergies   [name] 10/27/2021 4:04 AM

## 2021-10-27 NOTE — H&P (Signed)
History and Physical    Hoke Baer QRF:[id number] DOB: 1945/09/30 DOA: 10/26/2021  PCP: Zola Button, MD   Patient coming from: Home   Chief Complaint: AMS, cloudy urine   HPI: Wadell Card is a pleasant 76 y.o. male with medical history significant for depression, anxiety, prostate cancer, failure to thrive, chronic Foley catheter, and dysphagia with feeding tube dependence who is under the care of hospice but remains full code per family wishes and now presents to the emergency department with several days of altered mental status and cloudy urine.  [name] reports that the patient is normally conversant and able to express his needs, but has not been speaking in the past several days.  She has also noticed that his urine has become cloudy and darker over the same interval.  He appears to his daughter to be in pain but is unable to localize it.  He has not appeared to be coughing or short of breath.  ED Course: Upon arrival to the ED, patient is found to be febrile to 39.4 C and tachycardic to 826 with systolic blood pressure in the 80s.  EKG features sinus tachycardia and chest x-ray features and opacity at the left lower lobe which could reflect pneumonia or atelectasis.  Blood work notable for normal WBC, normal lactic acid, alkaline phosphatase 301, albumin 2.6, AST 237, ALT 111, and total bilirubin 1.4.  Blood and urine cultures were collected in the ED and the patient was given 1.5 L of LR, Rocephin, cefepime, and Flagyl.  Review of Systems:  Unable to complete ROS secondary to the patient's clinical condition.  Past Medical History:  Diagnosis Date   Adenomatous polyp of colon 06/2010   4 polyps removed at colonoscopy by Dr Henrene Pastor   Anxiety    Chronic headaches    Depression    Diverticulosis of colon 08/2010   Internal hemorrhoids 08/2010   Prostate cancer [facility])    receiving radiation treatment   Subdural hematoma, chronic ([place])    Tuberculosis     Past Surgical History:   Procedure Laterality Date   BIOPSY  01/29/2020   Procedure: BIOPSY;  Surgeon: Irene Shipper, MD;  Location: [facility] ENDOSCOPY;  Service: Endoscopy;;   COLONOSCOPY WITH PROPOFOL N/A 01/29/2020   Procedure: COLONOSCOPY WITH PROPOFOL;  Surgeon: Irene Shipper, MD;  Location: [place];  Service: Endoscopy;  Laterality: N/A;   ESOPHAGOGASTRODUODENOSCOPY (EGD) WITH PROPOFOL N/A 01/29/2020   Procedure: ESOPHAGOGASTRODUODENOSCOPY (EGD) WITH PROPOFOL;  Surgeon: Irene Shipper, MD;  Location[facility] ENDOSCOPY;  Service: Endoscopy;  Laterality: N/A;   FLEXIBLE SIGMOIDOSCOPY  03/29/2012   Procedure: FLEXIBLE SIGMOIDOSCOPY;  Surgeon: Inda Castle, MD;  Location: [place];  Service: Endoscopy;  Laterality: N/A;  possibly may do colonoscopy but prep is only for a flex   FLEXIBLE SIGMOIDOSCOPY  03/31/2012   Procedure: FLEXIBLE SIGMOIDOSCOPY;  Surgeon: Inda Castle, MD;  Location: [place];  Service: Endoscopy;  Laterality: N/A;   HEMORRHOID BANDING  03/31/2012   Procedure: HEMORRHOID BANDING;  Surgeon: Inda Castle, MD;  Location: [place];  Service: Endoscopy;  Laterality: N/A;   HOT HEMOSTASIS N/A 01/29/2020   Procedure: HOT HEMOSTASIS (ARGON PLASMA COAGULATION/BICAP);  Surgeon: Irene Shipper, MD;  Location: [facility] ENDOSCOPY;  Service: Endoscopy;  Laterality: N/A;   MULTIPLE EXTRACTIONS WITH ALVEOLOPLASTY N/A 08/08/2013   Procedure: MULTIPLE EXTRACTION OF TEETH #1, 2, 3, 6, 7, 9, 11, 15, 16, 17, 20, 21, 22, 23, 24, 26, 29, 30, 32 WITH ALVEOLOPLASTY AND REMOVAL  BUCCAL EXOSTOSIS LEFT MAXILLA;  Surgeon: Gae Bon, DDS;  Location: [place];  Service: Oral Surgery;  Laterality: N/A;   shrapnel removal     skull during [place] War    Social History:   reports that he quit smoking about 29 years ago. His smoking use included cigarettes. He has a 1.25 pack-year smoking history. He has never used smokeless tobacco. He reports that he does not drink alcohol and does not use drugs.  No Known  Allergies  History reviewed. No pertinent family history.   Prior to Admission medications   Medication Sig Start Date End Date Taking? Authorizing Provider  albuterol (VENTOLIN HFA) 108 (90 Base) MCG/ACT inhaler Inhale 2 puffs into the lungs every 4 (four) hours as needed for wheezing or shortness of breath. Patient not taking: Reported on 07/24/2021 07/04/21   Quintella Reichert, MD  baclofen (LIORESAL) 10 MG tablet Place 0.5 tablets (5 mg total) into feeding tube 3 (three) times daily. 07/25/21   Dwyane Dee, MD  bisacodyl (DULCOLAX) 10 MG suppository Place 1 suppository (10 mg total) rectally daily as needed for moderate constipation. Patient not taking: Reported on 07/24/2021 07/04/21   Quintella Reichert, MD  famotidine (PEPCID) 20 MG tablet Place 1 tablet (20 mg total) into feeding tube 2 (two) times daily. Patient not taking: Reported on 07/24/2021 07/04/21   Quintella Reichert, MD  Maltodextrin-Xanthan Gum (RESOURCE THICKENUP CLEAR) POWD Take 120 g by mouth as needed. Patient not taking: Reported on 07/24/2021 02/02/20   Armando Reichert, MD  midodrine (PROAMATINE) 2.5 MG tablet Place 1 tablet (2.5 mg total) into feeding tube 3 (three) times daily with meals. Patient taking differently: Place 2.5 mg into feeding tube 3 (three) times daily. 07/04/21   Quintella Reichert, MD  pantoprazole (PROTONIX) 40 MG tablet Take 1 tablet (40 mg total) by mouth daily. Patient not taking: Reported on 07/24/2021 02/03/20   Armando Reichert, MD    Physical Exam: Vitals:   10/27/21 0141 10/27/21 0215 10/27/21 0245 10/27/21 0249  BP: 108/61 100/60 101/67   Pulse: (!) 122 (!) 125 (!) 123   Resp: 20 (!) 26 (!) 23   Temp:    97.9 F (36.6 C)  TempSrc:    Oral  SpO2: 94% 93% 94%   Weight:      Height:         Constitutional: No respiratory distress, no pallor or cyanosis   Eyes: PERTLA, lids and conjunctivae normal ENMT: Mucous membranes are dry. Posterior pharynx clear of any exudate or lesions.   Neck: supple, no  masses  Respiratory: occasional wheeze, no crackles. No accessory muscle use.  Cardiovascular: Rate ~100 and regular. No extremity edema.  Abdomen: No distension, no tenderness, soft. Bowel sounds active.  Musculoskeletal: no clubbing / cyanosis. Flexion contractures.   Skin: no significant rashes, lesions, ulcers. Warm, dry, well-perfused. Neurologic: No gross facial asymmetry. Contracted. Awake but not answering basic questions.      Labs and Imaging on Admission: I have personally reviewed following labs and imaging studies  CBC: Recent Labs  Lab 10/26/21 2343  WBC 8.5  NEUTROABS 7.2  HGB 11.6*  HCT 34.6*  MCV 98.3  PLT 974   Basic Metabolic Panel: Recent Labs  Lab 10/26/21 2343  NA 134*  K 5.0  CL 102  CO2 23  GLUCOSE 106*  BUN 31*  CREATININE 1.07  CALCIUM 8.5*   GFR: Estimated Creatinine Clearance: 35.4 mL/min (by C-G formula based on SCr of 1.07 mg/dL). Liver Function Tests:  Recent Labs  Lab 10/26/21 2343  AST 231*  ALT 111*  ALKPHOS 301*  BILITOT 1.4*  PROT 6.3*  ALBUMIN 2.6*   No results for input(s): "LIPASE", "AMYLASE" in the last 168 hours. No results for input(s): "AMMONIA" in the last 168 hours. Coagulation Profile: Recent Labs  Lab 10/26/21 2343  INR 1.0   Cardiac Enzymes: No results for input(s): "CKTOTAL", "CKMB", "CKMBINDEX", "TROPONINI" in the last 168 hours. BNP (last 3 results) No results for input(s): "PROBNP" in the last 8760 hours. HbA1C: No results for input(s): "HGBA1C" in the last 72 hours. CBG: No results for input(s): "GLUCAP" in the last 168 hours. Lipid Profile: No results for input(s): "CHOL", "HDL", "LDLCALC", "TRIG", "CHOLHDL", "LDLDIRECT" in the last 72 hours. Thyroid Function Tests: No results for input(s): "TSH", "T4TOTAL", "FREET4", "T3FREE", "THYROIDAB" in the last 72 hours. Anemia Panel: No results for input(s): "VITAMINB12", "FOLATE", "FERRITIN", "TIBC", "IRON", "RETICCTPCT" in the last 72 hours. Urine  analysis:    Component Value Date/Time   COLORURINE AMBER (A) 10/26/2021 2354   APPEARANCEUR CLOUDY (A) 10/26/2021 2354   LABSPEC 1.018 10/26/2021 2354   PHURINE 7.0 10/26/2021 2354   GLUCOSEU NEGATIVE 10/26/2021 2354   HGBUR SMALL (A) 10/26/2021 2354   HGBUR negative 05/15/2010 1129   BILIRUBINUR NEGATIVE 10/26/2021 2354   BILIRUBINUR negative 11/18/2017 1115   KETONESUR NEGATIVE 10/26/2021 2354   PROTEINUR 100 (A) 10/26/2021 2354   UROBILINOGEN 0.2 11/18/2017 1115   UROBILINOGEN 0.2 05/06/2014 1648   NITRITE POSITIVE (A) 10/26/2021 2354   LEUKOCYTESUR LARGE (A) 10/26/2021 2354   Sepsis Labs: '@LABRCNTIP' (procalcitonin:4,lacticidven:4) ) Recent Results (from the past 240 hour(s))  Resp Panel by RT-PCR (Flu A&B, Covid) Anterior Nasal Swab     Status: None   Collection Time: 10/26/21 11:54 PM   Specimen: Anterior Nasal Swab  Result Value Ref Range Status   SARS Coronavirus 2 by RT PCR NEGATIVE NEGATIVE Final    Comment: (NOTE) SARS-CoV-2 target nucleic acids are NOT DETECTED.  The SARS-CoV-2 RNA is generally detectable in upper respiratory specimens during the acute phase of infection. The lowest concentration of SARS-CoV-2 viral copies this assay can detect is 138 copies/mL. A negative result does not preclude SARS-Cov-2 infection and should not be used as the sole basis for treatment or other patient management decisions. A negative result may occur with  improper specimen collection/handling, submission of specimen other than nasopharyngeal swab, presence of viral mutation(s) within the areas targeted by this assay, and inadequate number of viral copies(<138 copies/mL). A negative result must be combined with clinical observations, patient history, and epidemiological information. The expected result is Negative.  Fact Sheet for Patients:  EntrepreneurPulse.com.au  Fact Sheet for Healthcare Providers:  IncredibleEmployment.be  This  test is no t yet approved or cleared by the [place] FDA and  has been authorized for detection and/or diagnosis of SARS-CoV-2 by FDA under an Emergency Use Authorization (EUA). This EUA will remain  in effect (meaning this test can be used) for the duration of the COVID-19 declaration under Section 564(b)(1) of the Act, 21 U.S.C.section 360bbb-3(b)(1), unless the authorization is terminated  or revoked sooner.       Influenza A by PCR NEGATIVE NEGATIVE Final   Influenza B by PCR NEGATIVE NEGATIVE Final    Comment: (NOTE) The Xpert Xpress SARS-CoV-2/FLU/RSV plus assay is intended as an aid in the diagnosis of influenza from Nasopharyngeal swab specimens and should not be used as a sole basis for treatment. Nasal washings and aspirates are unacceptable  for Xpert Xpress SARS-CoV-2/FLU/RSV testing.  Fact Sheet for Patients: EntrepreneurPulse.com.au  Fact Sheet for Healthcare Providers: IncredibleEmployment.be  This test is not yet approved or cleared by the [place] FDA and has been authorized for detection and/or diagnosis of SARS-CoV-2 by FDA under an Emergency Use Authorization (EUA). This EUA will remain in effect (meaning this test can be used) for the duration of the COVID-19 declaration under Section 564(b)(1) of the Act, 21 U.S.C. section 360bbb-3(b)(1), unless the authorization is terminated or revoked.  Performed at [facility], [place] [address], [place], [place] [address]      Radiological Exams on Admission: US Abdomen Limited RUQ (LIVER/GB)  Result Date: 10/27/2021 CLINICAL DATA:  Severe sepsis with elevated liver function tests. EXAM: ULTRASOUND ABDOMEN LIMITED RIGHT UPPER QUADRANT COMPARISON:  None Available. FINDINGS: Gallbladder: No gallstones or wall thickening visualized (2.6 mm). Tiny echogenic foci with associated comet tail artifact are seen within a portion of the gallbladder wall. The presence or absence  of a sonographic Murphy's sign cannot be determined secondary to limitations of the study. Common bile duct: Diameter: 2.3 mm Liver: Markedly limited in evaluation without evidence of focal lesions. Within normal limits in parenchymal echogenicity. Portal vein is patent on color Doppler imaging with normal direction of blood flow towards the liver. Other: It should be noted that the study is limited secondary to the patient's condition (patient is contracted with knees up to his chest). IMPRESSION: 1. Markedly limited study, as described above, without evidence of cholelithiasis. 2. 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 Electronically Signed   By: Virgina Norfolk M.D.   On: 10/27/2021 05:08   DG Chest 1 View  Result Date: 10/27/2021 CLINICAL DATA:  Sepsis EXAM: CHEST  1 VIEW COMPARISON:  Radiographs 07/22/2021 FINDINGS: Hazy airspace opacities in the left lower lung are new since 07/22/2021. The left costophrenic angle is not included in the image. No definite pleural effusion. No pneumothorax.Stable cardiomediastinal silhouette. Prominent central pulmonary arteries. Aortic calcification. IMPRESSION: New left lower lung infiltrates could be due to pneumonia versus atelectasis. Aortic Atherosclerosis (ICD10-I70.0). Electronically Signed   By: Placido Sou M.D.   On: 10/27/2021 00:49    EKG: Independently reviewed. Sinus tachycardia, rate 123, non-specific IVCD.   Assessment/Plan   1. Severe sepsis secondary to UTI  - Presents with several days of AMS and cloudy urine, and found to be febrile and tachycardic with UA suggestive of infection  - He has hx of ESBL infections  - Blood and urine cultures were collected in ED and antibiotics were started  - Treat with meropenem for now, follow cultures and clinical course   2. Acute encephalopathy  - Pt normally conversant but not talking in several days per his daughter  - Likely from acute infection  - Expand workup if fails  to improve as expected with treatment of infection   3. Dysphagia; FTT; malnutrition  - Dependent on feeding tube, cachectic, contracted  - Dietary consulted   4. Elevated LFTs  - Alk phos 301, AST 231, ALT 111, and total bili 1.4 on admission, were normal in May 2023  - Abd exam benign  - Check RUQ [name] and viral hepatitis, trend    DVT prophylaxis: Lovenox  Code Status: Full, discussed with daughter in ED  Level of Care: Level of care: Progressive Family Communication: Daughter at bedside  Disposition Plan:  Patient is from: home  Anticipated d/c is to: TBD Anticipated d/c date is: 10/30/21  Patient currently: Pending cultures,  improvement in sepsis parameters  Consults called: none  Admission status: Inpatient     Vianne Bulls, MD [facility]  10/27/2021, 5:33 AM

## 2021-10-27 NOTE — Progress Notes (Addendum)
[facility] ED [facility] note  Mr. 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 Patient was sent to [facility] ED for AMS and possible infection from foley catheter. Patient is being admitted for Sepsis secondary to UTI. This is a related admission per [facility] MD.  Met patient and son at bedside. Patient was minimally responsive to voice, had one eye opened during conversation but unable to communicate. He is in fetal position from contraction of limbs. Upon assessment patient had his jaw dropped, low blood pressure systolic in the 66M,AYOKH red and edematous with a small frenulum tear on one side. His catheter was removed and a new one will be replaced by urology. Son at bedside and showed me Peg tube which was in place and clean. Patient also had some peripheral edema in hands and feet. Family is concerned about care of patient and does not want [name] to mention end of life. They state he will get better and wants aggressive treatment.   VS:  90/55, 74, 19 RR, 98 %RA I&O: none documented Labs:  Latest Reference Range & Units 10/27/21 05:15  COMPREHENSIVE METABOLIC PANEL  Rpt !  Sodium 135 - 145 mmol/L 133 (L)  Potassium 3.5 - 5.1 mmol/L 4.0  Chloride 98 - 111 mmol/L 103  CO2 22 - 32 mmol/L 24  Glucose 70 - 99 mg/dL 107 (H)  BUN 8 - 23 mg/dL 24 (H)  Creatinine 0.61 - 1.24 mg/dL 1.02  Calcium 8.9 - 10.3 mg/dL 7.8 (L)  Anion gap 5 - 15  6  Alkaline Phosphatase 38 - 126 U/L 212 (H)  Albumin 3.5 - 5.0 g/dL 2.0 (L)  AST 15 - 41 U/L 107 (H)  ALT 0 - 44 U/L 76 (H)  Total Protein 6.5 - 8.1 g/dL 4.9 (L)  Total Bilirubin 0.3 - 1.2 mg/dL 0.7  GFR, Estimated >60 mL/min >60  Lactic Acid, Venous 0.5 - 1.9 mmol/L 1.0  WBC 4.0 - 10.5 K/uL 9.4  RBC 4.22 - 5.81 MIL/uL 3.00 (L)  Hemoglobin 13.0 - 17.0 g/dL 9.8 (L)  HCT 39.0 - 52.0 % 29.4 (L)  MCV 80.0 - 100.0 fL 98.0  MCH 26.0 - 34.0 pg 32.7  MCHC 30.0 - 36.0 g/dL 33.3  RDW  11.5 - 15.5 % 11.9  Platelets 150 - 400 K/uL 294  nRBC 0.0 - 0.2 % 0.0  !: Data is abnormal (L): Data is abnormally low (H): Data is abnormally high  Diagnostics: IMPRESSION: New left lower lung infiltrates could be due to pneumonia versus atelectasis.   Aortic Atherosclerosis (ICD10-I70.0).     Electronically Signed   By: Placido Sou M.D.   On: 10/27/2021 00:49  Problem list: 1. Severe sepsis secondary to UTI  - Presents with several days of AMS and cloudy urine, and found to be febrile and tachycardic with UA suggestive of infection  Presented earlier this year in May 2023 for similar presentation - He has hx of ESBL infections  - Blood and urine cultures were collected in ED and antibiotics were started  - Treat with meropenem for now, follow cultures and clinical course  - attempted to repalace foley given concern for chronic indwelling foley with acute UTI/sepsis secondary to UTI, however unable to replace foley given swelling around head of penis. I have requested help from urology as well to assist with bladder decompressoin given his chronic debility.    2. Acute encephalopathy  - Pt normally  conversant but not talking in several days per his daughter  - Likely from acute infection  - Expand workup if fails to improve as expected with treatment of infection    3. Dysphagia; FTT; malnutrition  - Dependent on feeding tube, cachectic, contracted  - Dietary consulted  - Hospice consulted here to ensure patient care is per family wishes   4. Elevated LFTs  - Alk phos 301, AST 231, ALT 111, and total bili 1.4 on admission, were normal in May 2023  - Abd exam benign  - Check RUQ [name] and viral hepatitis, trend       IV/PRN: fentaNYL (SUBLIMAZE) injection 12.5-25 mcg Dose: 12.5-25 mcg Freq: Every 2 hours PRN Route: IV meropenem (MERREM) 1 g in sodium chloride 0.9 % 100 mL IVPB Dose: 1 g Freq: Every 12 hours Route: IV  This patient is inpatient appropriate for the  treatment of sepsis and UTI.   If patient needs transport at discharge please use [facility].  GOC: clear, FULL code-family wants aggressive measures. D/C planning: assuming back home once stable with hospice Family: updated dtr sophia by phone, son at bedside IDT: updated  Clementeen Hoof, [place], [facility] [phone]

## 2021-10-28 ENCOUNTER — Inpatient Hospital Stay (HOSPITAL_COMMUNITY): Payer: Medicare Other

## 2021-10-28 DIAGNOSIS — R6520 Severe sepsis without septic shock: Secondary | ICD-10-CM | POA: Diagnosis not present

## 2021-10-28 DIAGNOSIS — R4182 Altered mental status, unspecified: Secondary | ICD-10-CM | POA: Diagnosis not present

## 2021-10-28 DIAGNOSIS — A419 Sepsis, unspecified organism: Secondary | ICD-10-CM | POA: Diagnosis not present

## 2021-10-28 DIAGNOSIS — T83511A Infection and inflammatory reaction due to indwelling urethral catheter, initial encounter: Secondary | ICD-10-CM | POA: Diagnosis not present

## 2021-10-28 LAB — BLOOD CULTURE ID PANEL (REFLEXED) - BCID2

## 2021-10-28 LAB — COMPREHENSIVE METABOLIC PANEL
ALT: 52 U/L — ABNORMAL HIGH (ref 0–44)
AST: 45 U/L — ABNORMAL HIGH (ref 15–41)
Albumin: 1.9 g/dL — ABNORMAL LOW (ref 3.5–5.0)
Alkaline Phosphatase: 170 U/L — ABNORMAL HIGH (ref 38–126)
Anion gap: 8 (ref 5–15)
BUN: 19 mg/dL (ref 8–23)
CO2: 21 mmol/L — ABNORMAL LOW (ref 22–32)
Calcium: 8.2 mg/dL — ABNORMAL LOW (ref 8.9–10.3)
Chloride: 107 mmol/L (ref 98–111)
Creatinine, Ser: 0.99 mg/dL (ref 0.61–1.24)
GFR, Estimated: >60 mL/min (ref >60)
Glucose, Bld: 70 mg/dL (ref 70–99)
Potassium: 3.9 mmol/L (ref 3.5–5.1)
Sodium: 136 mmol/L (ref 135–145)
Total Bilirubin: 0.7 mg/dL (ref 0.3–1.2)
Total Protein: 4.8 g/dL — ABNORMAL LOW (ref 6.5–8.1)

## 2021-10-28 LAB — CBC
HCT: 27.2 % — ABNORMAL LOW (ref 39.0–52.0)
Hemoglobin: 9.2 g/dL — ABNORMAL LOW (ref 13.0–17.0)
MCH: 33.3 pg (ref 26.0–34.0)
MCHC: 33.8 g/dL (ref 30.0–36.0)
MCV: 98.6 fL (ref 80.0–100.0)
Platelets: 292 10*3/uL (ref 150–400)
RBC: 2.76 MIL/uL — ABNORMAL LOW (ref 4.22–5.81)
RDW: 11.9 % (ref 11.5–15.5)
WBC: 8.2 10*3/uL (ref 4.0–10.5)
nRBC: 0 % (ref 0.0–0.2)

## 2021-10-28 LAB — HEPATITIS PANEL, ACUTE
HCV Ab: NONREACTIVE
Hep A IgM: NONREACTIVE
Hep B C IgM: NONREACTIVE
Hepatitis B Surface Ag: NONREACTIVE

## 2021-10-28 LAB — URINE CULTURE: Culture: >=100000 — AB

## 2021-10-28 LAB — C-REACTIVE PROTEIN: CRP: 12 mg/dL — ABNORMAL HIGH (ref <1.0)

## 2021-10-28 MED ORDER — ADULT MULTIVITAMIN W/MINERALS CH
1.0000 | ORAL_TABLET | Freq: Every day | ORAL | Status: DC
  Administered 2021-10-28: 1 via ORAL
  Filled 2021-10-28 (×2): qty 1

## 2021-10-28 MED ORDER — GUAIFENESIN 100 MG/5ML PO LIQD
10.0000 mL | Freq: Four times a day (QID) | ORAL | Status: AC
  Administered 2021-10-28 – 2021-10-29 (×3): 10 mL
  Filled 2021-10-28 (×3): qty 10

## 2021-10-28 MED ORDER — ORAL CARE MOUTH RINSE
15.0000 mL | OROMUCOSAL | Status: DC | PRN
Start: 2021-10-28 — End: 2021-11-01

## 2021-10-28 MED ORDER — SODIUM CHLORIDE 0.9 % IN NEBU
3.0000 mL | INHALATION_SOLUTION | Freq: Two times a day (BID) | RESPIRATORY_TRACT | Status: AC
  Filled 2021-10-28 (×3): qty 3

## 2021-10-28 MED ORDER — IPRATROPIUM-ALBUTEROL 0.5-2.5 (3) MG/3ML IN SOLN
3.0000 mL | Freq: Four times a day (QID) | RESPIRATORY_TRACT | Status: AC
  Administered 2021-10-28: 3 mL via RESPIRATORY_TRACT
  Filled 2021-10-28: qty 3

## 2021-10-28 MED ORDER — LACTATED RINGERS IV SOLN: INTRAVENOUS | Status: DC

## 2021-10-28 MED ORDER — ORAL CARE MOUTH RINSE
15.0000 mL | OROMUCOSAL | Status: DC
  Administered 2021-10-28 – 2021-11-01 (×17): 15 mL via OROMUCOSAL

## 2021-10-28 MED ORDER — ENOXAPARIN SODIUM 30 MG/0.3ML IJ SOSY
30.0000 mg | PREFILLED_SYRINGE | INTRAMUSCULAR | Status: DC
  Administered 2021-10-29 – 2021-11-01 (×4): 30 mg via SUBCUTANEOUS
  Filled 2021-10-28 (×4): qty 0.3

## 2021-10-28 NOTE — Plan of Care (Signed)
  Problem: Clinical Measurements: Goal: Signs and symptoms of infection will decrease Outcome: Progressing   

## 2021-10-28 NOTE — TOC Initial Note (Signed)
Transition of Care [facility]) - Initial/Assessment Note    Patient Details  Name: [name] MRN: [id number] Date of Birth: 1946/03/02  [facility] of [facility] CM/SW Contact:    Carles Collet, RN Phone Number: 10/28/2021, 12:10 PM  Clinical Narrative:                 Patient admitted from home for UTI>Sepsis Patient followed with [facility] prior to admission. Per notes this is a hospice related admission.  Per notes has tube feeds, is contracted, lives w family. Anticipate medical transport at discharge, [facility] contracts with [facility] (not [facility]). TOC will continue to follow.    Expected Discharge Plan: Home w Hospice Care Barriers to Discharge: Continued Medical Work up   Patient Goals and CMS Choice        Expected Discharge Plan and Services Expected Discharge Plan: [place]   Discharge Planning Services: CM Consult   Living arrangements for the past 2 months: Single Family Home                                      Prior Living Arrangements/Services Living arrangements for the past 2 months: Single Family Home                     Activities of Daily Living Home Assistive Devices/Equipment: Wheelchair ADL Screening (condition at time of admission) Patient's cognitive ability adequate to safely complete daily activities?: No Is the patient deaf or have difficulty hearing?: No Does the patient have difficulty seeing, even when wearing glasses/contacts?: No Does the patient have difficulty concentrating, remembering, or making decisions?: Yes Patient able to express need for assistance with ADLs?: No Does the patient have difficulty dressing or bathing?: Yes Independently performs ADLs?: No Communication: Needs assistance Is this a change from baseline?: Change from baseline, expected to last <3 days Dressing (OT): Needs assistance Is this a change from baseline?: Pre-admission baseline Grooming: Needs assistance Is this a change from  baseline?: Pre-admission baseline Feeding: Needs assistance Is this a change from baseline?: Pre-admission baseline Bathing: Needs assistance Is this a change from baseline?: Pre-admission baseline Toileting: Needs assistance Is this a change from baseline?: Pre-admission baseline In/Out Bed: Needs assistance Is this a change from baseline?: Pre-admission baseline Walks in Home: Dependent Is this a change from baseline?: Pre-admission baseline Does the patient have difficulty walking or climbing stairs?: Yes Weakness of Legs: Both Weakness of Arms/Hands: Both  Permission Sought/Granted                  Emotional Assessment              Admission diagnosis:  Sepsis ([place]) [A41.9] Urinary tract infection without hematuria, site unspecified [N39.0] Sepsis, due to unspecified organism, unspecified whether acute organ dysfunction present [facility]) [A41.9] Patient Active Problem List   Diagnosis Date Noted   Severe sepsis ([place]) 10/27/2021   Elevated LFTs 10/27/2021   Acute encephalopathy 10/27/2021   UTI (urinary tract infection) 07/22/2021   FTT (failure to thrive) in adult 07/22/2021   Protein calorie malnutrition ([place]) 07/22/2021   Dysphagia 07/22/2021   Contracture of muscle of lower extremity, bilateral 07/22/2021   Radiation proctitis    Benign neoplasm of sigmoid colon    Gastroesophageal reflux disease without esophagitis    Decreased hemoglobin    Iron deficiency anemia    Leg swelling 01/26/2020   Abdominal  pain    Acute pancreatitis    Dysuria 11/18/2017   History of colonic polyps 10/01/2015   Moderate dementia without behavioral disturbance (HCC) 03/22/2015   Right-sided low back pain with right-sided sciatica 11/30/2014   Neuropathy 11/30/2014   Vitamin D insufficiency 07/09/2014   Right-sided low back pain without sciatica 07/06/2014   Memory loss 06/24/2014   Anemia of chronic disease 05/15/2014   Internal hemorrhoids with complication 59/93/5701    Heme positive stool 05/09/2014   Degenerative disc disease, lumbar 05/06/2014   Foraminal stenosis of lumbar region 05/06/2014   Depression 12/13/2013   Prostate cancer ([place]) 12/13/2013   HYPERCHOLESTEROLEMIA, MILD 05/19/2010   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 Pharmacy:   [facility] [place], [place] - [address] [place] [address] Phone: [phone] Fax: [phone]  [place], [place] [address] Phone: [phone] Fax: [phone]     Social Determinants of Health (SDOH) Interventions    Readmission Risk Interventions     No data to display

## 2021-10-28 NOTE — Progress Notes (Signed)
   10/28/21 1254  Assess: MEWS Score  Temp 98.6 F (37 C)  BP 134/81  MAP (mmHg) 94  Pulse Rate (!) 104  ECG Heart Rate (!) 104  Resp (!) 22  Level of Consciousness Alert  SpO2 93 %  O2 Device Room Air  Assess: MEWS Score  MEWS Temp 0  MEWS Systolic 0  MEWS Pulse 1  MEWS RR 1  MEWS LOC 0  MEWS Score 2  MEWS Score Color Yellow  Assess: if the MEWS score is Yellow or Red  Were vital signs taken at a resting state? Yes  Focused Assessment Change from prior assessment (see assessment flowsheet)  Does the patient meet 2 or more of the SIRS criteria? No  MEWS guidelines implemented *See Row Information* Yes  Treat  MEWS Interventions Administered scheduled meds/treatments  Take Vital Signs  Increase Vital Sign Frequency  Yellow: Q 2hr X 2 then Q 4hr X 2, if remains yellow, continue Q 4hrs  Escalate  MEWS: Escalate Yellow: discuss with charge nurse/RN and consider discussing with provider and RRT  Notify: Charge Nurse/RN  Name of Charge Nurse/RN Notified John,RN  Date Charge Nurse/RN Notified 10/28/21  Time Charge Nurse/RN Notified 1191  Notify: Provider  Provider Name/Title Asim Alanda Amass  Date Provider Notified 10/28/21  Time Provider Notified 1324  Method of Notification Page  Notification Reason Change in status  Provider response Other (Comment)  Date of Provider Response 10/28/21  Time of Provider Response 1325  Assess: SIRS CRITERIA  SIRS Temperature  0  SIRS Pulse 1  SIRS Respirations  1  SIRS WBC 1  SIRS Score Sum  3

## 2021-10-28 NOTE — Progress Notes (Signed)
Progress Note   Patient: [name] UYQ:[id number] DOB: [birth year] DOA: 10/26/2021     1 DOS: the patient was seen and examined on 10/28/2021   Brief hospital course: 76 y.o. M h/o depression, anxiety, prostate cancer, failure to thrive, chronic Foley catheter, and dysphagia with feeding tube dependence who is under the care of hospice but remains full code per family wishes and presented to ED on 8/13 for evalution of AMS and cloudy urine per family. Per family patient normally conversant and able to express needs but incorherent, not communicative. 8/14 low BP noted without tachycardia/fevers, continues to remain AMS, family in room, unable to exchange foley catheter 8/15 slightly improved mentation but not yet conversant, weak cough, + congestion. SLP to see, Hospice continuing to follow. UCX mixed results, will resend, continue IV abx for now. Foley replaced by Urology 8/14   Assessment and Plan:  1. Severe sepsis secondary to UTI  Improving. - Per family, patient with AMS, usually conversant at home. UA suggestive of infection but urine culture demonstrated multiple bacterial, will resend if this helps tailor abx Fortunately, Blood cultures NGTD Presented earlier this year in May 2023 for similar presentation, currently on hospice care, q monthly foley changes - He has hx of ESBL infections  - Blood and urine cultures were collected in ED and antibiotics were started  - Meropenem 8/14-  2. Acute encephalopathy  - Pt normally conversant but not talking in several days per his daughter  - Likely from acute infection  - Expand workup if fails to improve as expected with treatment of infection    3. Dysphagia; FTT; malnutrition  - Dependent on feeding tube, cachectic, contracted  - Dietary consulted  - Hospice consulted here to ensure patient care is per family wishes Unsure if patient will be able to have remarkable recovery at this point, will continue medical care as per daughter  wishes.   4. Elevated LFTs  - Alk phos 301, AST 231, ALT 111, and total bili 1.4 on admission, were normal in May 2023  - Abd exam benign  - RUQ [name] on 8/14- no evidence of cholecystitis/lithiasis. Gallbladder polyps noted viral hepatitis panel unremarkable, likely related to shock possible sepsis.      DVT prophylaxis: Lovenox  Code Status: Full, discussed with daughter in ED  Level of Care: Level of care: Progressive Family Communication: Daughter at bedside  Disposition Plan:  Patient is from: home  Anticipated d/c is to: TBD Anticipated d/c date is: 10/30/21  Patient currently: Pending cultures, improvement in sepsis parameters  Consults called: none  Admission status: Inpatient       Subjective:    Physical Exam: Vitals:   10/28/21 0500 10/28/21 0800 10/28/21 0900 10/28/21 1254  BP:  99/62 (!) 99/56 134/81  Pulse:  99 92 (!) 104  Resp:  (!) 26 20 (!) 22  Temp:  98 F (36.7 C) 98 F (36.7 C) 98.6 F (37 C)  TempSrc:  Axillary Axillary Axillary  SpO2:  97% 96% 93%  Weight: 46.4 kg     Height:       Constitutional: No respiratory distress, no pallor or cyanosis   Eyes: PERTLA, lids and conjunctivae normal ENMT: Mucous membranes are dry. Posterior pharynx clear of any exudate or lesions.   Neck: supple, no masses  Respiratory: occasional wheeze, no crackles. No accessory muscle use.  Cardiovascular: Rate ~100 and regular. No extremity edema.  Abdomen: No distension, no tenderness, soft. Bowel sounds active.  Musculoskeletal: no clubbing /  cyanosis. Flexion contractures.   Skin: no significant rashes, lesions, ulcers. Warm, dry, well-perfused. Neurologic: No gross facial asymmetry. Contracted. Awake but not answering basic questions.    Data Reviewed:  There are no new results to review at this time.  Family Communication: family at bedside  Disposition: Status is: Inpatient Remains inpatient appropriate because: AMS, sepsis, UTI  Planned Discharge  Destination: Home    Time spent: 25 minutes  Author: Vanna Scotland, MD 10/28/2021 1:45 PM  For on call review [email or web address].

## 2021-10-28 NOTE — Evaluation (Signed)
Speech Language Pathology Evaluation Patient Details Name: [name] MRN: [id number] DOB: [birth year] Today's Date: 10/28/2021 Time: 6160-7371 SLP Time Calculation (min) (ACUTE ONLY): 15 min  Problem List:  Patient Active Problem List   Diagnosis Date Noted   Severe sepsis ([place]) 10/27/2021   Elevated LFTs 10/27/2021   Acute encephalopathy 10/27/2021   UTI (urinary tract infection) 07/22/2021   FTT (failure to thrive) in adult 07/22/2021   Protein calorie malnutrition ([place]) 07/22/2021   Dysphagia 07/22/2021   Contracture of muscle of lower extremity, bilateral 07/22/2021   Radiation proctitis    Benign neoplasm of sigmoid colon    Gastroesophageal reflux disease without esophagitis    Decreased hemoglobin    Iron deficiency anemia    Leg swelling 01/26/2020   Abdominal pain    Acute pancreatitis    Dysuria 11/18/2017   History of colonic polyps 10/01/2015   Moderate dementia without behavioral disturbance (HCC) 03/22/2015   Right-sided low back pain with right-sided sciatica 11/30/2014   Neuropathy 11/30/2014   Vitamin D insufficiency 07/09/2014   Right-sided low back pain without sciatica 07/06/2014   Memory loss 06/24/2014   Anemia of chronic disease 05/15/2014   Internal hemorrhoids with complication 09/09/9483   Heme positive stool 05/09/2014   Degenerative disc disease, lumbar 05/06/2014   Foraminal stenosis of lumbar region 05/06/2014   Depression 12/13/2013   Prostate cancer ([place]) 12/13/2013   HYPERCHOLESTEROLEMIA, MILD 05/19/2010   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   Constipation 09/23/2009   BPH with urinary obstruction 09/23/2009   HEADACHE 09/23/2009   Past Medical History:  Past Medical History:  Diagnosis Date   Adenomatous polyp of colon 06/2010   4 polyps removed at colonoscopy by Dr Henrene Pastor   Anxiety    Chronic headaches    Depression    Diverticulosis of colon 08/2010   Internal hemorrhoids 08/2010   Prostate cancer [facility])    receiving radiation treatment    Subdural hematoma, chronic ([place])    Tuberculosis    Past Surgical History:  Past Surgical History:  Procedure Laterality Date   BIOPSY  01/29/2020   Procedure: BIOPSY;  Surgeon: Irene Shipper, MD;  Location: [facility] ENDOSCOPY;  Service: Endoscopy;;   COLONOSCOPY WITH PROPOFOL N/A 01/29/2020   Procedure: COLONOSCOPY WITH PROPOFOL;  Surgeon: Irene Shipper, MD;  Location: [facility] ENDOSCOPY;  Service: Endoscopy;  Laterality: N/A;   ESOPHAGOGASTRODUODENOSCOPY (EGD) WITH PROPOFOL N/A 01/29/2020   Procedure: ESOPHAGOGASTRODUODENOSCOPY (EGD) WITH PROPOFOL;  Surgeon: Irene Shipper, MD;  Location: [facility] ENDOSCOPY;  Service: Endoscopy;  Laterality: N/A;   FLEXIBLE SIGMOIDOSCOPY  03/29/2012   Procedure: FLEXIBLE SIGMOIDOSCOPY;  Surgeon: Inda Castle, MD;  Location: [place];  Service: Endoscopy;  Laterality: N/A;  possibly may do colonoscopy but prep is only for a flex   FLEXIBLE SIGMOIDOSCOPY  03/31/2012   Procedure: FLEXIBLE SIGMOIDOSCOPY;  Surgeon: Inda Castle, MD;  Location: [place];  Service: Endoscopy;  Laterality: N/A;   HEMORRHOID BANDING  03/31/2012   Procedure: HEMORRHOID BANDING;  Surgeon: Inda Castle, MD;  Location: [place];  Service: Endoscopy;  Laterality: N/A;   HOT HEMOSTASIS N/A 01/29/2020   Procedure: HOT HEMOSTASIS (ARGON PLASMA COAGULATION/BICAP);  Surgeon: Irene Shipper, MD;  Location: [facility] ENDOSCOPY;  Service: Endoscopy;  Laterality: N/A;   MULTIPLE EXTRACTIONS WITH ALVEOLOPLASTY N/A 08/08/2013   Procedure: MULTIPLE EXTRACTION OF TEETH #1, 2, 3, 6, 7, 9, 11, 15, 16, 17, 20, 21, 22, 23, 24, 26, 29, 30, 32 WITH ALVEOLOPLASTY AND REMOVAL BUCCAL EXOSTOSIS LEFT MAXILLA;  Surgeon: Gae Bon, DDS;  Location: [place];  Service: Oral Surgery;  Laterality: N/A;   shrapnel removal     skull during [place]   HPI:  76yo male admitted 10/26/21 with AMS, cloudy urine. PMH: TB, depression, anxiety, prostate cancer, FTT, chronic Foley catheter, dysphagia with FT, under care of  hospice. CXR opacity LLL PNA vs atelectasis. Feeding tube?   Assessment / Plan / Recommendation Clinical Impression  Pt seen at bedside for limited assessment of cognitive linguistic function. At baseline, dysarthric speech is documented by SLP in 2021. Today, with assistance of his son, pt was able to tell me only his name, and follow one step commands inconsistently. Vocal quality was noted to be wet, and pt had difficulty coughing/clearing his throat to clear it out. Volitional cough was weak, congested, and nonproductive. Unsure of true baseline level of function cognitively. Will continue acutely to facilitate orientation.    SLP Assessment  SLP Recommendation/Assessment: Patient needs continued Speech Language Pathology Services  SLP Visit Diagnosis: Cognitive communication deficit (R41.841)    Recommendations for follow up therapy are one component of a multi-disciplinary discharge planning process, led by the attending physician.  Recommendations may be updated based on patient status, additional functional criteria and insurance authorization.    Follow Up Recommendations  Follow physician's recommendations for discharge plan and follow up therapies    Assistance Recommended at Discharge  Frequent or constant Supervision/Assistance  Functional Status Assessment Patient has had a recent decline in their functional status and/or demonstrates limited ability to make significant improvements in function in a reasonable and predictable amount of time   Frequency and Duration min 1 x/week  1 week      SLP Evaluation Cognition  Arousal/Alertness: Awake/alert Orientation Level: Oriented to person       Comprehension  Auditory Comprehension Overall Auditory Comprehension: Impaired Yes/No Questions: Impaired Basic Immediate Environment Questions: 25-49% accurate Commands: Impaired One Step Basic Commands: 50-74% accurate Interfering Components: Other (comment) (language  barrier) Visual Recognition/Discrimination Discrimination: Not tested Reading Comprehension Reading Status: Not tested    Expression Expression Primary Mode of Expression: Verbal Verbal Expression Overall Verbal Expression: Impaired Written Expression Dominant Hand: Right Written Expression: Not tested   Oral / Motor  Oral Motor/Sensory Function Overall Oral Motor/Sensory Function: Generalized oral weakness Motor Speech Overall Motor Speech: Impaired at baseline (dysarthric speech documented on SLP intervention in 2021)           Elick Aguilera B. Quentin Ore, [facility], [place] Speech Language Pathologist Office: [phone]  Shonna Chock 10/28/2021, 12:40 PM

## 2021-10-28 NOTE — Progress Notes (Signed)
   10/28/21 1254  Assess: MEWS Score  Temp 98.6 F (37 C)  BP 134/81  MAP (mmHg) 94  Pulse Rate (!) 104  ECG Heart Rate (!) 104  Resp (!) 22  Level of Consciousness Alert  SpO2 93 %  O2 Device Room Air  Assess: MEWS Score  MEWS Temp 0  MEWS Systolic 0  MEWS Pulse 1  MEWS RR 1  MEWS LOC 0  MEWS Score 2  MEWS Score Color Yellow  Assess: if the MEWS score is Yellow or Red  Were vital signs taken at a resting state? Yes  Focused Assessment Change from prior assessment (see assessment flowsheet)  Does the patient meet 2 or more of the SIRS criteria? No  MEWS guidelines implemented *See Row Information* Yes  Treat  MEWS Interventions Administered scheduled meds/treatments  Take Vital Signs  Increase Vital Sign Frequency  Yellow: Q 2hr X 2 then Q 4hr X 2, if remains yellow, continue Q 4hrs  Escalate  MEWS: Escalate Yellow: discuss with charge nurse/RN and consider discussing with provider and RRT  Notify: Charge Nurse/RN  Name of Charge Nurse/RN Notified John,RN  Date Charge Nurse/RN Notified 10/28/21  Time Charge Nurse/RN Notified 1315  Notify: Provider  Provider Name/Title Asim Alanda Amass  Date Provider Notified 10/28/21  Time Provider Notified 1324  Method of Notification Page  Notification Reason Change in status  Provider response Other (Comment)  Date of Provider Response 10/28/21  Time of Provider Response 1325  Document  Progress note created (see row info) Yes  Assess: SIRS CRITERIA  SIRS Temperature  0  SIRS Pulse 1  SIRS Respirations  1  SIRS WBC 1  SIRS Score Sum  3

## 2021-10-28 NOTE — Progress Notes (Signed)
Initial Nutrition Assessment  DOCUMENTATION CODES:   Not applicable  INTERVENTION:   When medically appropriate, initiate home tube feed regimen of: 1 carton Osmolite 1.5 five time per day via PEG 50 mL free water flush before and after each bolus Provides 1775 kcal, 74 gm of protein, and 1505 mL free water daily.  Multivitamin w/ minerals daily  NUTRITION DIAGNOSIS:   Increased nutrient needs related to acute illness as evidenced by estimated needs.  GOAL:   Patient will meet greater than or equal to 90% of their needs  MONITOR:   TF tolerance, I & O's, Labs, Weight trends  REASON FOR ASSESSMENT:   Consult Assessment of nutrition requirement/status  ASSESSMENT:   76 y.o. male presented to the ED with AMS. PMH includes prostate cancer, FTT, dysphagia s/p PEG. Pt admitted with severe sepsis 2/2 UTI and acute encephalopathy.   Pt laying in bed, did not wake to RD voice or touch. Spoke with pt son in room, provided some history. Per son, pt does not take in any PO, is dependent on tube feeds.  Per EMR, pt has had a 14% weight loss within 3 months, this is clinically significant for time frame.    RD reached out to MD to start tube feeds. MD would like to hold for today and initiate tomorrow.    Medications reviewed and include: Protonix  Labs reviewed.  NUTRITION - FOCUSED PHYSICAL EXAM:  Deferred due to pt contracted.  Diet Order:   Diet Order             Diet NPO time specified  Diet effective now                   EDUCATION NEEDS:   Not appropriate for education at this time  Skin:  Skin Assessment: Reviewed RN Assessment  Last BM:  Unknown  Height:   Ht Readings from Last 1 Encounters:  10/26/21 '5\' 1"'$  (1.549 m)    Weight:   Wt Readings from Last 1 Encounters:  10/28/21 46.4 kg    Ideal Body Weight:  50.9 kg  BMI:  Body mass index is 19.33 kg/m.  Estimated Nutritional Needs:   Kcal:  1400-1600  Protein:  70-85 grams  Fluid:   >/= 1.5 L    Hermina Barters RD, LDN Clinical Dietitian See [facility] for contact information.

## 2021-10-28 NOTE — Evaluation (Signed)
Clinical/Bedside Swallow Evaluation Patient Details  Name: [name] MRN: [id number] Date of Birth: Sep 13, 1945  Today's Date: 10/28/2021 Time: SLP Start Time (ACUTE ONLY): 1210 SLP Stop Time (ACUTE ONLY): 1230 SLP Time Calculation (min) (ACUTE ONLY): 20 min  Past Medical History:  Past Medical History:  Diagnosis Date   Adenomatous polyp of colon 06/2010   4 polyps removed at colonoscopy by Dr Henrene Pastor   Anxiety    Chronic headaches    Depression    Diverticulosis of colon 08/2010   Internal hemorrhoids 08/2010   Prostate cancer [facility])    receiving radiation treatment   Subdural hematoma, chronic ([place])    Tuberculosis    Past Surgical History:  Past Surgical History:  Procedure Laterality Date   BIOPSY  01/29/2020   Procedure: BIOPSY;  Surgeon: Irene Shipper, MD;  Location: [facility] ENDOSCOPY;  Service: Endoscopy;;   COLONOSCOPY WITH PROPOFOL N/A 01/29/2020   Procedure: COLONOSCOPY WITH PROPOFOL;  Surgeon: Irene Shipper, MD;  Location: [place];  Service: Endoscopy;  Laterality: N/A;   ESOPHAGOGASTRODUODENOSCOPY (EGD) WITH PROPOFOL N/A 01/29/2020   Procedure: ESOPHAGOGASTRODUODENOSCOPY (EGD) WITH PROPOFOL;  Surgeon: Irene Shipper, MD;  Location: [facility] ENDOSCOPY;  Service: Endoscopy;  Laterality: N/A;   FLEXIBLE SIGMOIDOSCOPY  03/29/2012   Procedure: FLEXIBLE SIGMOIDOSCOPY;  Surgeon: Inda Castle, MD;  Location: [place];  Service: Endoscopy;  Laterality: N/A;  possibly may do colonoscopy but prep is only for a flex   FLEXIBLE SIGMOIDOSCOPY  03/31/2012   Procedure: FLEXIBLE SIGMOIDOSCOPY;  Surgeon: Inda Castle, MD;  Location: [place];  Service: Endoscopy;  Laterality: N/A;   HEMORRHOID BANDING  03/31/2012   Procedure: HEMORRHOID BANDING;  Surgeon: Inda Castle, MD;  Location: [place];  Service: Endoscopy;  Laterality: N/A;   HOT HEMOSTASIS N/A 01/29/2020   Procedure: HOT HEMOSTASIS (ARGON PLASMA COAGULATION/BICAP);  Surgeon: Irene Shipper, MD;  Location: [facility] ENDOSCOPY;   Service: Endoscopy;  Laterality: N/A;   MULTIPLE EXTRACTIONS WITH ALVEOLOPLASTY N/A 08/08/2013   Procedure: MULTIPLE EXTRACTION OF TEETH #1, 2, 3, 6, 7, 9, 11, 15, 16, 17, 20, 21, 22, 23, 24, 26, 29, 30, 32 WITH ALVEOLOPLASTY AND REMOVAL BUCCAL EXOSTOSIS LEFT MAXILLA;  Surgeon: Gae Bon, DDS;  Location: [place];  Service: Oral Surgery;  Laterality: N/A;   shrapnel removal     skull during [place]   HPI:  76yo male admitted 10/26/21 with AMS, cloudy urine. PMH: TB, depression, anxiety, prostate cancer, FTT, chronic Foley catheter, dysphagia with FT, under care of hospice. CXR opacity LLL PNA vs atelectasis. Feeding tube?    Assessment / Plan / Recommendation  Clinical Impression  Pt seen at bedside for assessment of swallow function and safety, and to identify PO readiness. Pt's son was present during this examination, assisting with interpreting. Pt was noted to exhibit a wet, congested, nonproductive cough prior to oral care or PO trials. His oral cavity was dry due to open mouth breathing. Thick white secretions were adhered to lingual surface, and thick secretions were adhered to the uvula. Oral care was completed with suction, which pt tolerated well. Wet voice quality and congested nonproductive cough noted throughout oral care. Pt accepted individual ice chips after oral care with variable oral manipulation noted. Audible swallow was noted, with immediate congested cough. Pt was given a small bolus of pudding, which he held orally and did not manipulate at all. This texture was suctioned from his mouth and further trials were discontinued. Pts son reports pt has had  only PEG tube feed recently, and reported being unsure when the Dys2/NTL diet recommendation following the MBS in 2021 was discontinued. Wet voice quality and congested nonproductive cough continued to be exhibited after PO trials had ended. At this time, pt's management of even his own secretions is highly questionable. Recommend  continuing with NPO status, except for individual ice chips for oral comfort and moisture after oral care. If instrumental study is warranted, anticipate a FEES would be more appropriate given extent of pt's contractures. SLP will follow to assess readiness for further work up.  SLP Visit Diagnosis: Dysphagia, unspecified (R13.10)    Aspiration Risk  Severe aspiration risk    Diet Recommendation NPO;Ice chips PRN after oral care   Liquid Administration via: Spoon Medication Administration: Via alternative means    Other  Recommendations Oral Care Recommendations: Oral care prior to ice chip/H20 Other Recommendations: Have oral suction available    Recommendations for follow up therapy are one component of a multi-disciplinary discharge planning process, led by the attending physician.  Recommendations may be updated based on patient status, additional functional criteria and insurance authorization.  Follow up Recommendations Follow physician's recommendations for discharge plan and follow up therapies      Assistance Recommended at Discharge Frequent or constant Supervision/Assistance  Functional Status Assessment Patient has had a recent decline in their functional status and/or demonstrates limited ability to make significant improvements in function in a reasonable and predictable amount of time  Frequency and Duration min 1 x/week  1 week       Prognosis Prognosis for Safe Diet Advancement: Fair Barriers to Reach Goals: Severity of deficits      Swallow Study   General Date of Onset: 10/26/21 HPI: 76yo male admitted 10/26/21 with AMS, cloudy urine. PMH: TB, depression, anxiety, prostate cancer, FTT, chronic Foley catheter, dysphagia with FT, under care of hospice. CXR opacity LLL PNA vs atelectasis. Feeding tube? Type of Study: Bedside Swallow Evaluation Previous Swallow Assessment: MBS 01/30/20, rec D2/NTL, trace aspiration of thin with inconsistent cough response. Diet  Prior to this Study: NPO;PEG tube Temperature Spikes Noted: No Respiratory Status: Room air History of Recent Intubation: No Behavior/Cognition: Alert;Doesn't follow directions;Requires cueing Oral Cavity Assessment: Dry Oral Care Completed by SLP: Yes Oral Cavity - Dentition: Missing dentition Self-Feeding Abilities: Total assist Patient Positioning: Upright in bed Baseline Vocal Quality: Wet Volitional Cough: Cognitively unable to elicit Volitional Swallow: Unable to elicit    Oral/Motor/Sensory Function Overall Oral Motor/Sensory Function: Generalized oral weakness   Ice Chips Ice chips: Impaired Oral Phase Impairments: Poor awareness of bolus;Impaired mastication;Reduced lingual movement/coordination Oral Phase Functional Implications: Prolonged oral transit Pharyngeal Phase Impairments: Suspected delayed Swallow;Cough - Delayed;Wet Vocal Quality   Thin Liquid Thin Liquid: Not tested    Nectar Thick Nectar Thick Liquid: Not tested   Honey Thick Honey Thick Liquid: Not tested   Puree Puree: Impaired Presentation: Spoon Oral Phase Impairments: Reduced lingual movement/coordination;Impaired mastication;Reduced labial seal;Poor awareness of bolus Oral Phase Functional Implications: Oral holding Other Comments: pudding was suctioned from oral cavity   Solid     Solid: Not tested     Geneive Sandstrom B. Quentin Ore, [facility], [place] Speech Language Pathologist Office: [phone]  Shonna Chock 10/28/2021,12:55 PM

## 2021-10-28 NOTE — Progress Notes (Signed)
PHARMACY - PHYSICIAN COMMUNICATION CRITICAL VALUE ALERT - BLOOD CULTURE IDENTIFICATION (BCID)  [name] is an 76 y.o. male who presented to [facility] on 10/26/2021 with a chief complaint of sepsis/UTI   Name of physician (or Provider) Contacted: Dr. Velia Meyer   Current antibiotics: Merrem  Changes to prescribed antibiotics recommended:  No changes  Results for orders placed or performed during the hospital encounter of 10/26/21  Blood Culture ID Panel (Reflexed) (Collected: 10/26/2021 11:06 PM)  Result Value Ref Range   Enterococcus faecalis NOT DETECTED NOT DETECTED   Enterococcus Faecium NOT DETECTED NOT DETECTED   Listeria monocytogenes NOT DETECTED NOT DETECTED   Staphylococcus species DETECTED (A) NOT DETECTED   Staphylococcus aureus (BCID) NOT DETECTED NOT DETECTED   Staphylococcus epidermidis NOT DETECTED NOT DETECTED   Staphylococcus lugdunensis NOT DETECTED NOT DETECTED   Streptococcus species NOT DETECTED NOT DETECTED   Streptococcus agalactiae NOT DETECTED NOT DETECTED   Streptococcus pneumoniae NOT DETECTED NOT DETECTED   Streptococcus pyogenes NOT DETECTED NOT DETECTED   A.calcoaceticus-baumannii NOT DETECTED NOT DETECTED   Bacteroides fragilis NOT DETECTED NOT DETECTED   Enterobacterales NOT DETECTED NOT DETECTED   Enterobacter cloacae complex NOT DETECTED NOT DETECTED   Escherichia coli NOT DETECTED NOT DETECTED   Klebsiella aerogenes NOT DETECTED NOT DETECTED   Klebsiella oxytoca NOT DETECTED NOT DETECTED   Klebsiella pneumoniae NOT DETECTED NOT DETECTED   Proteus species NOT DETECTED NOT DETECTED   Salmonella species NOT DETECTED NOT DETECTED   Serratia marcescens NOT DETECTED NOT DETECTED   Haemophilus influenzae NOT DETECTED NOT DETECTED   Neisseria meningitidis NOT DETECTED NOT DETECTED   Pseudomonas aeruginosa NOT DETECTED NOT DETECTED   Stenotrophomonas maltophilia NOT DETECTED NOT DETECTED   Candida albicans NOT DETECTED NOT DETECTED   Candida auris  NOT DETECTED NOT DETECTED   Candida glabrata NOT DETECTED NOT DETECTED   Candida krusei NOT DETECTED NOT DETECTED   Candida parapsilosis NOT DETECTED NOT DETECTED   Candida tropicalis NOT DETECTED NOT DETECTED   Cryptococcus neoformans/gattii NOT DETECTED NOT DETECTED    Narda Bonds 10/28/2021  6:01 AM

## 2021-10-28 NOTE — Progress Notes (Signed)
[facility] night cross cover note:   I was notified by RN of patient's BP of 91/57. This appears relative to systolic blood pressures in the high 90s to low 100s mmHg over the last 7-8 hours, in this patient here with severe sepsis due to urinary tract infection.  Received IV fluid bolus at time of admission, with ensuing continuous IV fluids now completed.  Confirmed that the patient is not currently receiving any IV fluids.  Per my initial chart review, no documentation of a history of CHF.  I subsequently placed order for continuous LR at 125 cc/h, with consideration for IV fluid bolus if blood pressure decreases further.      Babs Bertin, DO Hospitalist

## 2021-10-28 NOTE — Progress Notes (Addendum)
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 Patient was brought to [facility] ED for altered mental status and possible infection from foley catheter. Hospice was not notified. Patient was admitted on 8.14.23 for sepsis secondary to a urinary tract infection. Per Dr. Tomasa Hosteller with [facility], this is a related admission.   Visited [name] today, young man present sleeping at bedside. Spoke with Daughter via phone call to provide update. Patent is non-verbal, laying in bed, with eyes open and tracking, brow furrowed. Foley present, draining clear yellow urine. Patient's limbs are contracted into fetal position, some peripheral edema noted. Report exchanged with hospital team.    [name] is inpatient appropriate as he is requiring IV antibiotics for treatment of sepsis.      V/S:  BP:99/56, P:92, RR:20, Temp:98.0, Sp02:96 on RA   I&O: 1553.7/850   Labs:   COMPREHENSIVE METABOLIC PANEL:  CO2: 21 (L) Calcium: 8.2 (L) Alkaline Phosphatase: 170 (H) Albumin: 1.9 (L) AST: 45 (H) ALT: 52 (H) Total Protein: 4.8 (L) RBC: 2.76 (L) Hemoglobin: 9.2 (L) HCT: 27.2 (L)  Diagnostics: None today    IVs/PRNs:  metroNIDAZOLE (FLAGYL) IVPB 500 mg - once IV  cefTRIAXone (ROCEPHIN) 2 g in sodium chloride 0.9 % 100 mL IVPB - q24hrs IV   sodium chloride flush (NS) 0.9 % injection 3 mL - q12hr, IV  lactated ringers infusion - 157ml/hr, continuous, IV  meropenem (MERREM) 1 g in sodium chloride 0.9 % 100 mL IVPB - q12hrs, IV   Problem list:  Assessment and Plan:   1. Severe sepsis secondary to UTI  Improving. - Per family, patient with AMS, usually conversant at home. UA suggestive of infection but urine culture demonstrated multiple bacterial, will resend if this helps tailor abx Fortunately, Blood cultures NGTD Presented earlier this year in May 2023 for similar presentation, currently  on hospice care, q monthly foley changes - He has hx of ESBL infections  - Blood and urine cultures were collected in ED and antibiotics were started  - Meropenem 8/14-   2. Acute encephalopathy  - Pt normally conversant but not talking in several days per his daughter  - Likely from acute infection  - Expand workup if fails to improve as expected with treatment of infection    3. Dysphagia; FTT; malnutrition  - Dependent on feeding tube, cachectic, contracted  - Dietary consulted  - Hospice consulted here to ensure patient care is per family wishes Unsure if patient will be able to have remarkable recovery at this point, will continue medical care as per daughter wishes.   4. Elevated LFTs  - Alk phos 301, AST 231, ALT 111, and total bili 1.4 on admission, were normal in May 2023  - Abd exam benign  - RUQ [name] on 8/14- no evidence of cholecystitis/lithiasis. Gallbladder polyps noted viral hepatitis panel unremarkable, likely related to shock possible sepsis.      D/C planning: Ongoing.   GOC: Patient remains a Full Code at this time, family desires full aggressive treatment.      IDT: Updated   Family: Spoke to daughter via phone call and provided update.   Transfer summary and medication list placed in shadow chart.    Should patient need ambulance transfer - Please use [facility]) as they contract this service for our active hospice patients.    Zigmund Gottron RN  [facility]  Hospital Liaison  [phone]

## 2021-10-29 DIAGNOSIS — T83511A Infection and inflammatory reaction due to indwelling urethral catheter, initial encounter: Secondary | ICD-10-CM | POA: Diagnosis not present

## 2021-10-29 DIAGNOSIS — A419 Sepsis, unspecified organism: Secondary | ICD-10-CM | POA: Diagnosis not present

## 2021-10-29 DIAGNOSIS — E46 Unspecified protein-calorie malnutrition: Secondary | ICD-10-CM | POA: Diagnosis not present

## 2021-10-29 DIAGNOSIS — G9340 Encephalopathy, unspecified: Secondary | ICD-10-CM | POA: Diagnosis not present

## 2021-10-29 DIAGNOSIS — R1310 Dysphagia, unspecified: Secondary | ICD-10-CM | POA: Diagnosis not present

## 2021-10-29 DIAGNOSIS — R4182 Altered mental status, unspecified: Secondary | ICD-10-CM | POA: Diagnosis not present

## 2021-10-29 LAB — CBC
HCT: 26.2 % — ABNORMAL LOW (ref 39.0–52.0)
Hemoglobin: 9.2 g/dL — ABNORMAL LOW (ref 13.0–17.0)
MCH: 33.5 pg (ref 26.0–34.0)
MCHC: 35.1 g/dL (ref 30.0–36.0)
MCV: 95.3 fL (ref 80.0–100.0)
Platelets: 298 10*3/uL (ref 150–400)
RBC: 2.75 MIL/uL — ABNORMAL LOW (ref 4.22–5.81)
RDW: 11.9 % (ref 11.5–15.5)
WBC: 8.6 10*3/uL (ref 4.0–10.5)
nRBC: 0 % (ref 0.0–0.2)

## 2021-10-29 LAB — COMPREHENSIVE METABOLIC PANEL
ALT: 38 U/L (ref 0–44)
AST: 38 U/L (ref 15–41)
Albumin: 1.9 g/dL — ABNORMAL LOW (ref 3.5–5.0)
Alkaline Phosphatase: 151 U/L — ABNORMAL HIGH (ref 38–126)
Anion gap: 10 (ref 5–15)
BUN: 17 mg/dL (ref 8–23)
CO2: 19 mmol/L — ABNORMAL LOW (ref 22–32)
Calcium: 8.1 mg/dL — ABNORMAL LOW (ref 8.9–10.3)
Chloride: 107 mmol/L (ref 98–111)
Creatinine, Ser: 1.03 mg/dL (ref 0.61–1.24)
GFR, Estimated: >60 mL/min (ref >60)
Glucose, Bld: 70 mg/dL (ref 70–99)
Potassium: 3.9 mmol/L (ref 3.5–5.1)
Sodium: 136 mmol/L (ref 135–145)
Total Bilirubin: 1.5 mg/dL — ABNORMAL HIGH (ref 0.3–1.2)
Total Protein: 4.6 g/dL — ABNORMAL LOW (ref 6.5–8.1)

## 2021-10-29 LAB — URINE CULTURE: Culture: NO GROWTH

## 2021-10-29 MED ORDER — CHLORHEXIDINE GLUCONATE CLOTH 2 % EX PADS
6.0000 | MEDICATED_PAD | Freq: Every day | CUTANEOUS | Status: DC
  Administered 2021-10-29 – 2021-11-01 (×4): 6 via TOPICAL

## 2021-10-29 NOTE — Progress Notes (Signed)
PROGRESS NOTE        PATIENT DETAILS Name: [name] Age: 76 y.o. Sex: male Date of Birth: Apr 25, 1945 Admit Date: 10/26/2021 Admitting Physician Vianne Bulls, MD PCP:No primary care provider on file.  Brief Summary: Patient is a 76 y.o.  male with history of prostate cancer, failure to thrive syndrome-PEG tube/chronic indwelling Foley catheter in place-chronically contracted-followed by hospice at home-presenting with acute metabolic encephalopathy in the setting of complicated UTI.  Significant events: 8/13>> admit to [facility]-complicated UTI/sepsis/acute metabolic encephalopathy.  Significant studies: 8/14>> CXR: Left lung infiltrate. 8/14>> RUQ ultrasound: No cholelithiasis-findings consistent with gallbladder adenomyomatosis.  Significant microbiology data: 8/13>> COVID/influenza PCR: Negative 8/13>> blood culture: 1/2-Gram-positive cocci in clusters-suspicion for coag negative staph-likely contaminant. 8/13>> urine culture:> 100,000 colonies/mL of multiple species  Procedures: None  Consults: Narrative care  Subjective: Extremely frail-contracted lower extremities/upper extremities-sleeping-opens eyes-Per son sleeping at bedside-some improvement in mental status over the past few days.  Objective: Vitals: Blood pressure 100/63, pulse 66, temperature 98.2 F (36.8 C), temperature source Axillary, resp. rate 19, height '5\' 1"'$  (1.549 m), weight 47.2 kg, SpO2 100 %.   Exam: Gen Exam: Chronically frail appearing-not in any distress. HEENT:atraumatic, normocephalic Chest: B/L clear to auscultation anteriorly CVS:S1S2 regular Abdomen:soft non tender, non distended Extremities:no edema-contracted upper/lower extremities. Skin: no rash  Pertinent Labs/Radiology:    Latest Ref Rng & Units 10/29/2021    4:51 AM 10/28/2021    4:34 AM 10/27/2021    5:15 AM  CBC  WBC 4.0 - 10.5 K/uL 8.6  8.2  9.4   Hemoglobin 13.0 - 17.0 g/dL 9.2  9.2  9.8    Hematocrit 39.0 - 52.0 % 26.2  27.2  29.4   Platelets 150 - 400 K/uL 298  292  294     Lab Results  Component Value Date   NA 136 10/29/2021   K 3.9 10/29/2021   CL 107 10/29/2021   CO2 19 (L) 10/29/2021      Assessment/Plan: Sepsis due to complicated UTI: Sepsis physiology improved-unfortunately urine cultures growing multiple bacterial morphotypes-repeat urine culture has been ordered on 8/15.  Given prior history of ESBL E. coli-reasonable to continue meropenem x7 days.  Acute metabolic encephalopathy:Likely due to sepsis/UTI-at baseline-frail/weak but conversant-Per family member/son at bedside-improving-unclear if he is back to baseline.  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  Being followed by hospice team during this hospitalization as well.  Will await further recommendations.  Transaminitis: Likely ischemic hepatitis/shock liver in the setting of sepsis-improved.  History of prostate cancer-chronic indwelling Foley catheter: Foley catheter changed by urology on admission.  Nutrition Status: Nutrition Problem: Increased nutrient needs Etiology: acute illness Signs/Symptoms: estimated needs Interventions: Refer to RD note for recommendations  Underweight: Estimated body mass index is 19.66 kg/m as calculated from the following:   Height as of this encounter: '5\' 1"'$  (1.549 m).   Weight as of this encounter: 47.2 kg.   Code status:   Code Status: Full Code   DVT Prophylaxis: enoxaparin (LOVENOX) injection 30 mg Start: 10/29/21 1000   Family Communication:Son at bedside   Disposition Plan: Status is: Inpatient Remains inpatient appropriate because: Resolving sepsis physiology-requires IV antimicrobial therapy x7 days.   Planned Discharge  Destination:Home  Diet: Diet Order             Diet NPO time specified  Diet effective now                     Antimicrobial agents: Anti-infectives (From admission, onward)    Start     Dose/Rate Route Frequency Ordered Stop   10/27/21 0600  meropenem (MERREM) 1 g in sodium chloride 0.9 % 100 mL IVPB        1 g 200 mL/hr over 30 Minutes Intravenous Every 12 hours 10/27/21 0406     10/26/21 2330  cefTRIAXone (ROCEPHIN) 2 g in sodium chloride 0.9 % 100 mL IVPB  Status:  Discontinued        2 g 200 mL/hr over 30 Minutes Intravenous Every 24 hours 10/26/21 2319 10/27/21 0314   10/26/21 2300  ceFEPIme (MAXIPIME) 2 g in sodium chloride 0.9 % 100 mL IVPB        2 g 200 mL/hr over 30 Minutes Intravenous  Once 10/26/21 2255 10/26/21 2356   10/26/21 2300  metroNIDAZOLE (FLAGYL) IVPB 500 mg        500 mg 100 mL/hr over 60 Minutes Intravenous  Once 10/26/21 2255 10/27/21 0039   10/26/21 2300  vancomycin (VANCOCIN) IVPB 1000 mg/200 mL premix  Status:  Discontinued        1,000 mg 200 mL/hr over 60 Minutes Intravenous  Once 10/26/21 2255 10/27/21 0034        MEDICATIONS: Scheduled Meds:  baclofen  5 mg Per Tube TID   enoxaparin (LOVENOX) injection  30 mg Subcutaneous Q24H   midodrine  2.5 mg Per Tube TID WC   multivitamin with minerals  1 tablet Oral Daily   mouth rinse  15 mL Mouth Rinse 4 times per day   pantoprazole  40 mg Oral Daily   sodium chloride  3 mL Nebulization BID   sodium chloride flush  3 mL Intravenous Q12H   Continuous Infusions:  lactated ringers 125 mL/hr at 10/29/21 0620   meropenem (MERREM) IV 1 g (10/28/21 2119)   PRN Meds:.acetaminophen **OR** acetaminophen, albuterol, fentaNYL (SUBLIMAZE) injection, ondansetron **OR** ondansetron (ZOFRAN) IV, mouth rinse   I have personally reviewed following labs and imaging studies  LABORATORY DATA: CBC: Recent Labs  Lab 10/26/21 2343 10/27/21 0515 10/28/21 0434 10/29/21 0451  WBC 8.5 9.4 8.2 8.6   NEUTROABS 7.2  --   --   --   HGB 11.6* 9.8* 9.2* 9.2*  HCT 34.6* 29.4* 27.2* 26.2*  MCV 98.3 98.0 98.6 95.3  PLT 350 294 292 786    Basic Metabolic Panel: Recent Labs  Lab 10/26/21 2343 10/27/21 0515 10/28/21 0434 10/29/21 0451  NA 134* 133* 136 136  K 5.0 4.0 3.9 3.9  CL 102 103 107 107  CO2 23 24 21* 19*  GLUCOSE 106* 107* 70 70  BUN 31* 24* 19 17  CREATININE 1.07 1.02 0.99 1.03  CALCIUM 8.5* 7.8* 8.2* 8.1*    GFR: Estimated Creatinine Clearance: 42 mL/min (by C-G formula based on SCr of 1.03 mg/dL).  Liver Function Tests: Recent Labs  Lab 10/26/21 2343 10/27/21 0515 10/28/21 0434 10/29/21 0451  AST 231* 107* 45* 38  ALT 111* 76* 52* 38  ALKPHOS 301* 212* 170* 151*  BILITOT 1.4* 0.7 0.7 1.5*  PROT 6.3* 4.9* 4.8* 4.6*  ALBUMIN 2.6* 2.0* 1.9* 1.9*   No results for input(s): "LIPASE", "AMYLASE" in the last 168 hours. No results for input(s): "AMMONIA"  in the last 168 hours.  Coagulation Profile: Recent Labs  Lab 10/26/21 2343  INR 1.0    Cardiac Enzymes: No results for input(s): "CKTOTAL", "CKMB", "CKMBINDEX", "TROPONINI" in the last 168 hours.  BNP (last 3 results) No results for input(s): "PROBNP" in the last 8760 hours.  Lipid Profile: No results for input(s): "CHOL", "HDL", "LDLCALC", "TRIG", "CHOLHDL", "LDLDIRECT" in the last 72 hours.  Thyroid Function Tests: No results for input(s): "TSH", "T4TOTAL", "FREET4", "T3FREE", "THYROIDAB" in the last 72 hours.  Anemia Panel: No results for input(s): "VITAMINB12", "FOLATE", "FERRITIN", "TIBC", "IRON", "RETICCTPCT" in the last 72 hours.  Urine analysis:    Component Value Date/Time   COLORURINE AMBER (A) 10/26/2021 2354   APPEARANCEUR CLOUDY (A) 10/26/2021 2354   LABSPEC 1.018 10/26/2021 2354   PHURINE 7.0 10/26/2021 2354   GLUCOSEU NEGATIVE 10/26/2021 2354   HGBUR SMALL (A) 10/26/2021 2354   HGBUR negative 05/15/2010 1129   BILIRUBINUR NEGATIVE 10/26/2021 2354   BILIRUBINUR negative  11/18/2017 1115   KETONESUR NEGATIVE 10/26/2021 2354   PROTEINUR 100 (A) 10/26/2021 2354   UROBILINOGEN 0.2 11/18/2017 1115   UROBILINOGEN 0.2 05/06/2014 1648   NITRITE POSITIVE (A) 10/26/2021 2354   LEUKOCYTESUR LARGE (A) 10/26/2021 2354    Sepsis Labs: Lactic Acid, Venous    Component Value Date/Time   LATICACIDVEN 1.0 10/27/2021 0515    MICROBIOLOGY: Recent Results (from the past 240 hour(s))  Blood Culture (routine x 2)     Status: None (Preliminary result)   Collection Time: 10/26/21 11:06 PM   Specimen: BLOOD LEFT HAND  Result Value Ref Range Status   Specimen Description BLOOD LEFT HAND  Final   Special Requests   Final    BOTTLES DRAWN AEROBIC AND ANAEROBIC Blood Culture results may not be optimal due to an inadequate volume of blood received in culture bottles   Culture  Setup Time   Final    GRAM POSITIVE COCCI IN CLUSTERS ANAEROBIC BOTTLE ONLY CRITICAL RESULT CALLED TO, READ BACK BY AND VERIFIED WITH:  C/ PHARMD JAMES L. 10/28/21 0357 A. LAFRANCE Performed at [facility], [place] [address] [place], [place] [address]    Culture GRAM POSITIVE COCCI IN CLUSTERS  Final   Report Status PENDING  Incomplete  Blood Culture ID Panel (Reflexed)     Status: Abnormal   Collection Time: 10/26/21 11:06 PM  Result Value Ref Range Status   Enterococcus faecalis NOT DETECTED NOT DETECTED Final   Enterococcus Faecium NOT DETECTED NOT DETECTED Final   Listeria monocytogenes NOT DETECTED NOT DETECTED Final   Staphylococcus species DETECTED (A) NOT DETECTED Final    Comment: CRITICAL RESULT CALLED TO, READ BACK BY AND VERIFIED WITH:  C/ PHARMD JAMES L. 10/28/21 0357 A. LAFRANCE    Staphylococcus aureus (BCID) NOT DETECTED NOT DETECTED Final   Staphylococcus epidermidis NOT DETECTED NOT DETECTED Final   Staphylococcus lugdunensis NOT DETECTED NOT DETECTED Final   Streptococcus species NOT DETECTED NOT DETECTED Final   Streptococcus agalactiae NOT DETECTED NOT DETECTED Final    Streptococcus pneumoniae NOT DETECTED NOT DETECTED Final   Streptococcus pyogenes NOT DETECTED NOT DETECTED Final   A.calcoaceticus-baumannii NOT DETECTED NOT DETECTED Final   Bacteroides fragilis NOT DETECTED NOT DETECTED Final   Enterobacterales NOT DETECTED NOT DETECTED Final   Enterobacter cloacae complex NOT DETECTED NOT DETECTED Final   Escherichia coli NOT DETECTED NOT DETECTED Final   Klebsiella aerogenes NOT DETECTED NOT DETECTED Final   Klebsiella oxytoca NOT DETECTED NOT DETECTED Final  Klebsiella pneumoniae NOT DETECTED NOT DETECTED Final   Proteus species NOT DETECTED NOT DETECTED Final   Salmonella species NOT DETECTED NOT DETECTED Final   Serratia marcescens NOT DETECTED NOT DETECTED Final   Haemophilus influenzae NOT DETECTED NOT DETECTED Final   Neisseria meningitidis NOT DETECTED NOT DETECTED Final   Pseudomonas aeruginosa NOT DETECTED NOT DETECTED Final   Stenotrophomonas maltophilia NOT DETECTED NOT DETECTED Final   Candida albicans NOT DETECTED NOT DETECTED Final   Candida auris NOT DETECTED NOT DETECTED Final   Candida glabrata NOT DETECTED NOT DETECTED Final   Candida krusei NOT DETECTED NOT DETECTED Final   Candida parapsilosis NOT DETECTED NOT DETECTED Final   Candida tropicalis NOT DETECTED NOT DETECTED Final   Cryptococcus neoformans/gattii NOT DETECTED NOT DETECTED Final    Comment: Performed at [facility], [place] [address], [place], [place] [address]  Blood Culture (routine x 2)     Status: None (Preliminary result)   Collection Time: 10/26/21 11:42 PM   Specimen: BLOOD  Result Value Ref Range Status   Specimen Description BLOOD RIGHT UPPER ARM  Final   Special Requests   Final    BOTTLES DRAWN AEROBIC AND ANAEROBIC Blood Culture results may not be optimal due to an inadequate volume of blood received in culture bottles   Culture   Final    NO GROWTH 2 DAYS Performed at [facility], [place] [address] [place], [place] [address]    Report  Status PENDING  Incomplete  Resp Panel by RT-PCR (Flu A&B, Covid) Anterior Nasal Swab     Status: None   Collection Time: 10/26/21 11:54 PM   Specimen: Anterior Nasal Swab  Result Value Ref Range Status   SARS Coronavirus 2 by RT PCR NEGATIVE NEGATIVE Final    Comment: (NOTE) SARS-CoV-2 target nucleic acids are NOT DETECTED.  The SARS-CoV-2 RNA is generally detectable in upper respiratory specimens during the acute phase of infection. The lowest concentration of SARS-CoV-2 viral copies this assay can detect is 138 copies/mL. A negative result does not preclude SARS-Cov-2 infection and should not be used as the sole basis for treatment or other patient management decisions. A negative result may occur with  improper specimen collection/handling, submission of specimen other than nasopharyngeal swab, presence of viral mutation(s) within the areas targeted by this assay, and inadequate number of viral copies(<138 copies/mL). A negative result must be combined with clinical observations, patient history, and epidemiological information. The expected result is Negative.  Fact Sheet for Patients:  EntrepreneurPulse.com.au  Fact Sheet for Healthcare Providers:  IncredibleEmployment.be  This test is no t yet approved or cleared by the [place] FDA and  has been authorized for detection and/or diagnosis of SARS-CoV-2 by FDA under an Emergency Use Authorization (EUA). This EUA will remain  in effect (meaning this test can be used) for the duration of the COVID-19 declaration under Section 564(b)(1) of the Act, 21 U.S.C.section 360bbb-3(b)(1), unless the authorization is terminated  or revoked sooner.       Influenza A by PCR NEGATIVE NEGATIVE Final   Influenza B by PCR NEGATIVE NEGATIVE Final    Comment: (NOTE) The Xpert Xpress SARS-CoV-2/FLU/RSV plus assay is intended as an aid in the diagnosis of influenza from Nasopharyngeal swab specimens  and should not be used as a sole basis for treatment. Nasal washings and aspirates are unacceptable for Xpert Xpress SARS-CoV-2/FLU/RSV testing.  Fact Sheet for Patients: EntrepreneurPulse.com.au  Fact Sheet for Healthcare Providers: IncredibleEmployment.be  This test  is not yet approved or cleared by the [place] and has been authorized for detection and/or diagnosis of SARS-CoV-2 by FDA under an Emergency Use Authorization (EUA). This EUA will remain in effect (meaning this test can be used) for the duration of the COVID-19 declaration under Section 564(b)(1) of the Act, 21 U.S.C. section 360bbb-3(b)(1), unless the authorization is terminated or revoked.  Performed at [facility], [place] [address] [place], [place] [address]   Urine Culture     Status: Abnormal   Collection Time: 10/26/21 11:55 PM   Specimen: Urine, Catheterized  Result Value Ref Range Status   Specimen Description URINE, CATHETERIZED  Final   Special Requests   Final    NONE Performed at [facility], [place] [address], [place], [place] [address]    Culture (A)  Final    >=100,000 COLONIES/mL MULTIPLE SPECIES PRESENT, SUGGEST RECOLLECTION   Report Status 10/28/2021 FINAL  Final    RADIOLOGY STUDIES/RESULTS: DG CHEST PORT 1 VIEW  Result Date: 10/28/2021 CLINICAL DATA:  Dyspnea EXAM: PORTABLE CHEST 1 VIEW COMPARISON:  10/27/2021 FINDINGS: Persistent but improved patchy density at the lung bases. Lung volumes remain low. No pleural effusion or pneumothorax. Stable cardiomediastinal contours. IMPRESSION: Persistent but improved patchy density at the lung bases suggesting atelectasis. Electronically Signed   By: Macy Mis M.D.   On: 10/28/2021 15:53     LOS: 2 days   Oren Binet, MD  [facility]    To contact the attending provider between 7A-7P or the covering provider during after hours 7P-7A, please log into the web site [email or web address]  and access using universal [place] password for that web site. If you do not have the password, please call the hospital operator.  10/29/2021, 10:05 AM

## 2021-10-29 NOTE — Progress Notes (Signed)
[facility]) Hospital liaison note:   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 Patient was brought to [facility] ED for altered mental status and possible infection from foley catheter. Hospice was not notified. Patient was admitted on 8.14.23 for sepsis secondary to a urinary tract infection. Per Dr. Tomasa Hosteller with [facility], this is a related admission.   Visited [name] today, young male present at bedside. Patent is non-verbal, lying in bed, resting with eyes slightly open. Foley present, draining clear yellow urine. Patient's limbs are contracted into fetal position, still has some peripheral edema noted. Report exchanged with hospital team. Spoke to Daughter, [name] via telephone. Provided an update and addressed any questions and concerns. Offered to provide support for IV antibiotics at home, daughter feels that he is too sick to go home today. Will follow up tomorrow.     Mr [name] is inpatient appropriate as he is requiring IV antibiotics for treatment of sepsis.      V/S:  BP:100/63, P:66, RR:19, Temp:98.2, Sp02:100 on '@2L'$ /min via [place]   I&O: 0/900   Labs:   COMPREHENSIVE METABOLIC PANEL:  CO2: 19 (L) Calcium: 8.1 (L) Alkaline Phosphatase: 151 (H) Albumin: 1.9 (L) Total Protein: 4.6 (L) Total Bilirubin: 1.5 (H) RBC: 2.75 (L) Hemoglobin: 9.2 (L) HCT: 26.2 (L)   IVs/PRNs:   sodium chloride flush (NS) 0.9 % injection 3 mL - q12hr, IV   lactated ringers infusion - 143m/hr, continuous, IV   meropenem (MERREM) 1 g in sodium chloride 0.9 % 100 mL IVPB - q12hrs, IV    Diagnostics:  PORTABLE CHEST 1 VIEW IMPRESSION: Persistent but improved patchy density at the lung bases suggesting atelectasis.  Problem list:  Assessment/Plan: Sepsis due to complicated UTI: Sepsis physiology improved-unfortunately urine cultures growing multiple bacterial morphotypes-repeat urine culture has been ordered on 8/15.   Given prior history of ESBL E. coli-reasonable to continue meropenem x7 days Acute metabolic encephalopathy:Likely due to sepsis/UTI-at baseline-frail/weak but conversant-Per family member/son at bedside-improving-unclear if he is back to baseline.   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  Being followed by hospice team during this hospitalization as well.  Will await further recommendations.   Transaminitis: Likely ischemic hepatitis/shock liver in the setting of sepsis-improved.   History of prostate cancer-chronic indwelling Foley catheter: Foley catheter changed by urology on admission.   Nutrition Status: Nutrition Problem: Increased nutrient needs Etiology: acute illness Signs/Symptoms: estimated needs Interventions: Refer to RD note for recommendations   Underweight: Estimated body mass index is 19.66 kg/m as calculated from the following:   Height as of this encounter: '5\' 1"'$  (1.549 m).   Weight as of this encounter: 47.2 kg.      D/C planning: Ongoing; Hospice can support IV antibiotics at home if family accepts discharge.   GOC: Patient remains a Full Code currently, family desires full aggressive treatment.      IDT: Updated   Family: Spoke to Daughter, [name] via telephone. Offered to provide support for IV antibiotics at home but daughter feels that he is too sick to go home today.    Transfer summary and medication list placed in shadow chart.    Should patient need ambulance transfer - Please use [facility] as they contract this service for our active hospice patients.  [place]  [facility] Liaison  [phone]

## 2021-10-29 NOTE — TOC Progression Note (Signed)
Transition of Care [facility]) - Progression Note    Patient Details  Name: [name] MRN: [id number] Date of Birth: Dec 03, 1945  Transition of Care [facility]) CM/SW [place], RN Phone Number: 10/29/2021, 12:14 PM  Clinical Narrative:     Damaris Schooner to [name],  Daughter, regarding patient discharging home on IV antibiotics. [name] is concerned that patient isn't stable yet to go home. Notified MD who will reassess patient tomorrow.  Olivia Mackie with [facility] state that [facility] can provide home iv antibiotics as long as it is only once a day.   TOC will continue to follow for needs.  Expected Discharge Plan: Home w Hospice Care Barriers to Discharge: Continued Medical Work up  Expected Discharge Plan and Services Expected Discharge Plan: [place]   Discharge Planning Services: CM Consult   Living arrangements for the past 2 months: Single Family Home                                       Social Determinants of Health (SDOH) Interventions    Readmission Risk Interventions     No data to display

## 2021-10-30 ENCOUNTER — Inpatient Hospital Stay: Payer: Self-pay

## 2021-10-30 DIAGNOSIS — R4182 Altered mental status, unspecified: Secondary | ICD-10-CM | POA: Diagnosis not present

## 2021-10-30 DIAGNOSIS — G9340 Encephalopathy, unspecified: Secondary | ICD-10-CM | POA: Diagnosis not present

## 2021-10-30 DIAGNOSIS — E46 Unspecified protein-calorie malnutrition: Secondary | ICD-10-CM | POA: Diagnosis not present

## 2021-10-30 DIAGNOSIS — R1310 Dysphagia, unspecified: Secondary | ICD-10-CM | POA: Diagnosis not present

## 2021-10-30 DIAGNOSIS — A419 Sepsis, unspecified organism: Secondary | ICD-10-CM | POA: Diagnosis not present

## 2021-10-30 DIAGNOSIS — T83511A Infection and inflammatory reaction due to indwelling urethral catheter, initial encounter: Secondary | ICD-10-CM | POA: Diagnosis not present

## 2021-10-30 LAB — PHOSPHORUS
Phosphorus: 2.4 mg/dL — ABNORMAL LOW (ref 2.5–4.6)
Phosphorus: 2.3 mg/dL — ABNORMAL LOW (ref 2.5–4.6)

## 2021-10-30 LAB — CULTURE, BLOOD (ROUTINE X 2)

## 2021-10-30 LAB — MAGNESIUM
Magnesium: 1.7 mg/dL (ref 1.7–2.4)
Magnesium: 1.6 mg/dL — ABNORMAL LOW (ref 1.7–2.4)

## 2021-10-30 LAB — GLUCOSE, CAPILLARY
Glucose-Capillary: 122 mg/dL — ABNORMAL HIGH (ref 70–99)
Glucose-Capillary: 143 mg/dL — ABNORMAL HIGH (ref 70–99)
Glucose-Capillary: 146 mg/dL — ABNORMAL HIGH (ref 70–99)

## 2021-10-30 MED ORDER — ADULT MULTIVITAMIN LIQUID CH
15.0000 mL | Freq: Every day | ORAL | Status: DC
  Filled 2021-10-30: qty 15

## 2021-10-30 MED ORDER — OSMOLITE 1.5 CAL PO LIQD
237.0000 mL | Freq: Every day | ORAL | Status: DC
  Administered 2021-10-30 – 2021-11-01 (×10): 237 mL
  Filled 2021-10-30 (×13): qty 237

## 2021-10-30 MED ORDER — FREE WATER
100.0000 mL | Freq: Every day | Status: DC
  Administered 2021-10-30 – 2021-11-01 (×10): 100 mL

## 2021-10-30 MED ORDER — ADULT MULTIVITAMIN W/MINERALS CH
1.0000 | ORAL_TABLET | Freq: Every day | ORAL | Status: DC
  Administered 2021-10-30 – 2021-11-01 (×3): 1
  Filled 2021-10-30 (×3): qty 1

## 2021-10-30 MED ORDER — PANTOPRAZOLE 2 MG/ML SUSPENSION
40.0000 mg | Freq: Every day | ORAL | Status: DC
  Administered 2021-10-30 – 2021-11-01 (×3): 40 mg
  Filled 2021-10-30 (×3): qty 20

## 2021-10-30 NOTE — Progress Notes (Addendum)
Pharmacy Antibiotic Note  [name] is a 76 y.o. male with chronic foley and h/o ESBL infections admitted on 10/26/2021 with AMS, possible UTI.  Pharmacy was consulted 10/27/21 for Meropenem dosing for sepsis sedondarty to  complicated UTI.   Current hospice patient terminal dx end stage cerebral vascular disease.   Chronic indwelling foley.   Day #4 on Meropenem  1g IV q12h.   Scr 1.03 stable., CrCl 42 ml/min. Febrile, WBC within normal.   Dr. Sloan Leiter plans likely to continue  IV antibiotic for 7 days.   Plan: Continue Meropenem 1 g IV q12h Monitor clinical status, renal function and culture results daily.    Height: '5\' 1"'$  (154.9 cm) Weight: 47.2 kg (104 lb 0.9 oz) IBW/kg (Calculated) : 52.3  Temp (24hrs), Avg:98.6 F (37 C), Min:98.2 F (36.8 C), Max:98.9 F (37.2 C)  Recent Labs  Lab 10/26/21 2343 10/27/21 0515 10/28/21 0434 10/29/21 0451  WBC 8.5 9.4 8.2 8.6  CREATININE 1.07 1.02 0.99 1.03  LATICACIDVEN 1.8 1.0  --   --      Estimated Creatinine Clearance: 42 mL/min (by C-G formula based on SCr of 1.03 mg/dL).    No Known Allergies   Nicole Cella, [place] Clinical Pharmacist [phone] 10/30/2021 11:00 AM Please check AMION for all [place] phone numbers After 10:00 PM, call [place] [phone]

## 2021-10-30 NOTE — Progress Notes (Signed)
Speech Language Pathology Treatment: Dysphagia  Patient Details Name: [name] MRN: [id number] DOB: [birth year] Today's Date: 10/30/2021 Time: 28-1740 SLP Time Calculation (min) (ACUTE ONLY): 15 min  Assessment / Plan / Recommendation Clinical Impression  Patient seen by SLP for skilled treatment session focused on dysphagia goals. Patient had eyes closed the entire session but after SLP started oral care, he did respond minimally. He was able to open mouth with tactile cues and kept mouth open as SLP provided extensive oral  care to remove thick secretions that were coating sides and top of tongue as well as hard and soft palate. (Not able to remove all secretions). He sucked on toothette sponge when presented but no swallow initiated. SLP presented ice chips to patient's lips and although he started to form/pucker lips on ice chip, he was not able to even begin to try to bring into mouth. A very small amount of water from melting ice remained in oral cavity, resulting in a delayed cough. When SLP suctioning patient after cough, small amount of secretions removed from back of tongue. No family present during this session. Based on patient's current and recent performance, expectations of his ability to tolerate PO's is guarded at this time and he remains a high aspiration risk. Recommend continue NPO status and continue to focus on oral care and keeping oral mucosa moist and free of secretions. SLP will continue to follow patient for PO trials.    HPI HPI: 76yo male admitted 10/26/21 with AMS, cloudy urine. PMH: TB, depression, anxiety, prostate cancer, FTT, chronic Foley catheter, dysphagia with FT, under care of hospice. CXR opacity LLL PNA vs atelectasis. Feeding tube?      SLP Plan  Continue with current plan of care      Recommendations for follow up therapy are one component of a multi-disciplinary discharge planning process, led by the attending physician.  Recommendations may be updated  based on patient status, additional functional criteria and insurance authorization.    Recommendations  Diet recommendations: NPO Medication Administration: Via alternative means                Oral Care Recommendations: Oral care prior to ice chip/H20 Follow Up Recommendations: Follow physician's recommendations for discharge plan and follow up therapies Assistance recommended at discharge: Frequent or constant Supervision/Assistance SLP Visit Diagnosis: Dysphagia, unspecified (R13.10) Plan: Continue with current plan of care          Sonia Baller, MA, CCC-SLP Speech Therapy

## 2021-10-30 NOTE — Progress Notes (Signed)
PROGRESS NOTE        PATIENT DETAILS Name: [name] Age: 76 y.o. Sex: male Date of Birth: 26-Mar-1945 Admit Date: 10/26/2021 Admitting Physician Vianne Bulls, MD PCP:No primary care provider on file.  Brief Summary: Patient is a 76 y.o.  male with history of prostate cancer, dementia, failure to thrive syndrome-PEG tube/chronic indwelling Foley catheter in place-chronically contracted upper/lower extremities-followed by hospice at home-presenting with acute metabolic encephalopathy in the setting of complicated UTI.  Significant events: 8/13>> admit to [facility]-complicated UTI/sepsis/acute metabolic encephalopathy.  Significant studies: 8/14>> CXR: Left lung infiltrate. 8/14>> RUQ ultrasound: No cholelithiasis-findings consistent with gallbladder adenomyomatosis.  Significant microbiology data: 8/13>> COVID/influenza PCR: Negative 8/13>> blood culture: 1/2-Gram-positive cocci in clusters-suspicion for coag negative staph-likely contaminant. 8/13>> urine culture:> 100,000 colonies/mL of multiple species  Procedures: None  Consults: Narrative care  Subjective: Opens eyes-attempts to track my movement but beyond that he does not do anything much.  Daughter at bedside.  Objective: Vitals: Blood pressure 115/81, pulse 89, temperature 98.2 F (36.8 C), temperature source Axillary, resp. rate (!) 24, height '5\' 1"'$  (1.549 m), weight 47.2 kg, SpO2 100 %.   Exam: Gen Exam: Very frail-appearing/contracted upper/lower extremities-not in distress. HEENT:atraumatic, normocephalic Chest: B/L clear to auscultation anteriorly CVS:S1S2 regular Abdomen:soft non tender, non distended Extremities:no edema Neurology: Contracted upper/lower extremities Skin: no rash   Pertinent Labs/Radiology:    Latest Ref Rng & Units 10/29/2021    4:51 AM 10/28/2021    4:34 AM 10/27/2021    5:15 AM  CBC  WBC 4.0 - 10.5 K/uL 8.6  8.2  9.4   Hemoglobin 13.0 - 17.0 g/dL 9.2  9.2   9.8   Hematocrit 39.0 - 52.0 % 26.2  27.2  29.4   Platelets 150 - 400 K/uL 298  292  294     Lab Results  Component Value Date   NA 136 10/29/2021   K 3.9 10/29/2021   CL 107 10/29/2021   CO2 19 (L) 10/29/2021      Assessment/Plan: Sepsis due to complicated UTI: Sepsis physiology has resolved-afebrile-no leukocytosis-remains on meropenem.  Unfortunately-urine cultures grew out multiple bacterial morphotypes-repeat urine culture ordered on 8/15 negative so far (sterile as on antibiotics).  Given prior history of ESBL E. coli-reasonable to treat with meropenem x7 days.    Acute metabolic encephalopathy:Likely due to sepsis/UTI-at baseline very frail/weak-but able to have some minimal conversation.  Currently opens eyes but unclear whether he is able to speak or not.  Daughter at bedside this morning-acknowledges improvement when compared to admission.  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 she is not willing to consider full hospice care.  She is very hesitant to take him home on IV antibiotics.  She indicates that she would want him resuscitated in case he decompensates.  Given his frail state-at risk for decompensation-I will attempt to talk with our palliative care team today and see if we can continue to engage with family.  Transaminitis: Likely ischemic hepatitis/shock liver in the setting of sepsis-improved.  History of prostate cancer-chronic indwelling Foley catheter: Foley catheter changed by urology on admission.  Palliative care: See above discussion-very frail-appears to  have significant dementia-history of SDH-recurrent falls-now PEG tube-chronic Foley-contracted in all 4  extremities-at risk for recurrent MDR infections-with very poor quality of life.  Long discussion with daughter this morning-although she understands that her father is slowly deteriorating-and is likely to continue to have recurrent infections-she is very hesitant to consider comfort/hospice care-and seems not to fully understand the gravity of his overall clinical condition.  She wanted to find out if his prostate cancer was still active-I proceeded to explain to her that it really does not matter if the cancer was active-as further work-up will not change outcome or management given his overall frail state.  She was also wanting to know if he could swallow-given his advanced dementia-PEG tube in for several years-and his current mental state-I told her that it is unlikely that he is any shape ready for oral intake at this point.  I will reach out to the palliative care team-and see if we can continue to engage with family.  Daughter is also very hesitant to take him home with IV antibiotics.  Nutrition Status: Nutrition Problem: Increased nutrient needs Etiology: acute illness Signs/Symptoms: estimated needs Interventions: Refer to RD note for recommendations  Underweight: Estimated body mass index is 19.66 kg/m as calculated from the following:   Height as of this encounter: '5\' 1"'$  (1.549 m).   Weight as of this encounter: 47.2 kg.   Code status:   Code Status: Full Code   DVT Prophylaxis: enoxaparin (LOVENOX) injection 30 mg Start: 10/29/21 1000   Family Communication:Son at bedside   Disposition Plan: Status is: Inpatient Remains inpatient appropriate because: Resolving sepsis physiology-requires IV antimicrobial therapy x7 days.   Planned Discharge Destination:Home   Diet: Diet Order             Diet NPO time specified  Diet effective now                     Antimicrobial agents: Anti-infectives (From admission, onward)    Start     Dose/Rate Route Frequency  Ordered Stop   10/27/21 0600  meropenem (MERREM) 1 g in sodium chloride 0.9 % 100 mL IVPB        1 g 200 mL/hr over 30 Minutes Intravenous Every 12 hours 10/27/21 0406     10/26/21 2330  cefTRIAXone (ROCEPHIN) 2 g in sodium chloride 0.9 % 100 mL IVPB  Status:  Discontinued        2 g 200 mL/hr over 30 Minutes Intravenous Every 24 hours 10/26/21 2319 10/27/21 0314   10/26/21 2300  ceFEPIme (MAXIPIME) 2 g in sodium chloride 0.9 % 100 mL IVPB        2 g 200 mL/hr over 30 Minutes Intravenous  Once 10/26/21 2255 10/26/21 2356   10/26/21 2300  metroNIDAZOLE (FLAGYL) IVPB 500 mg        500 mg 100 mL/hr over 60 Minutes Intravenous  Once 10/26/21 2255 10/27/21 0039   10/26/21 2300  vancomycin (VANCOCIN) IVPB 1000 mg/200 mL premix  Status:  Discontinued        1,000 mg 200 mL/hr over 60 Minutes Intravenous  Once 10/26/21 2255 10/27/21 0034        MEDICATIONS: Scheduled Meds:  baclofen  5 mg Per Tube TID   Chlorhexidine Gluconate Cloth  6 each Topical Daily   enoxaparin (LOVENOX) injection  30 mg Subcutaneous Q24H   feeding supplement (OSMOLITE 1.5 CAL)  237 mL Per Tube 5 X Daily   free water  100  mL Per Tube 5 X Daily   midodrine  2.5 mg Per Tube TID WC   multivitamin  15 mL Oral Daily   mouth rinse  15 mL Mouth Rinse 4 times per day   pantoprazole  40 mg Per Tube Daily   sodium chloride flush  3 mL Intravenous Q12H   Continuous Infusions:  lactated ringers 125 mL/hr at 10/30/21 1106   meropenem (MERREM) IV 1 g (10/30/21 1115)   PRN Meds:.acetaminophen **OR** acetaminophen, albuterol, fentaNYL (SUBLIMAZE) injection, ondansetron **OR** ondansetron (ZOFRAN) IV, mouth rinse   I have personally reviewed following labs and imaging studies  LABORATORY DATA: CBC: Recent Labs  Lab 10/26/21 2343 10/27/21 0515 10/28/21 0434 10/29/21 0451  WBC 8.5 9.4 8.2 8.6  NEUTROABS 7.2  --   --   --   HGB 11.6* 9.8* 9.2* 9.2*  HCT 34.6* 29.4* 27.2* 26.2*  MCV 98.3 98.0 98.6 95.3  PLT 350 294  292 298     Basic Metabolic Panel: Recent Labs  Lab 10/26/21 2343 10/27/21 0515 10/28/21 0434 10/29/21 0451  NA 134* 133* 136 136  K 5.0 4.0 3.9 3.9  CL 102 103 107 107  CO2 23 24 21* 19*  GLUCOSE 106* 107* 70 70  BUN 31* 24* 19 17  CREATININE 1.07 1.02 0.99 1.03  CALCIUM 8.5* 7.8* 8.2* 8.1*     GFR: Estimated Creatinine Clearance: 42 mL/min (by C-G formula based on SCr of 1.03 mg/dL).  Liver Function Tests: Recent Labs  Lab 10/26/21 2343 10/27/21 0515 10/28/21 0434 10/29/21 0451  AST 231* 107* 45* 38  ALT 111* 76* 52* 38  ALKPHOS 301* 212* 170* 151*  BILITOT 1.4* 0.7 0.7 1.5*  PROT 6.3* 4.9* 4.8* 4.6*  ALBUMIN 2.6* 2.0* 1.9* 1.9*    No results for input(s): "LIPASE", "AMYLASE" in the last 168 hours. No results for input(s): "AMMONIA" in the last 168 hours.  Coagulation Profile: Recent Labs  Lab 10/26/21 2343  INR 1.0     Cardiac Enzymes: No results for input(s): "CKTOTAL", "CKMB", "CKMBINDEX", "TROPONINI" in the last 168 hours.  BNP (last 3 results) No results for input(s): "PROBNP" in the last 8760 hours.  Lipid Profile: No results for input(s): "CHOL", "HDL", "LDLCALC", "TRIG", "CHOLHDL", "LDLDIRECT" in the last 72 hours.  Thyroid Function Tests: No results for input(s): "TSH", "T4TOTAL", "FREET4", "T3FREE", "THYROIDAB" in the last 72 hours.  Anemia Panel: No results for input(s): "VITAMINB12", "FOLATE", "FERRITIN", "TIBC", "IRON", "RETICCTPCT" in the last 72 hours.  Urine analysis:    Component Value Date/Time   COLORURINE AMBER (A) 10/26/2021 2354   APPEARANCEUR CLOUDY (A) 10/26/2021 2354   LABSPEC 1.018 10/26/2021 2354   PHURINE 7.0 10/26/2021 2354   GLUCOSEU NEGATIVE 10/26/2021 2354   HGBUR SMALL (A) 10/26/2021 2354   HGBUR negative 05/15/2010 1129   BILIRUBINUR NEGATIVE 10/26/2021 2354   BILIRUBINUR negative 11/18/2017 1115   KETONESUR NEGATIVE 10/26/2021 2354   PROTEINUR 100 (A) 10/26/2021 2354   UROBILINOGEN 0.2 11/18/2017 1115    UROBILINOGEN 0.2 05/06/2014 1648   NITRITE POSITIVE (A) 10/26/2021 2354   LEUKOCYTESUR LARGE (A) 10/26/2021 2354    Sepsis Labs: Lactic Acid, Venous    Component Value Date/Time   LATICACIDVEN 1.0 10/27/2021 0515    MICROBIOLOGY: Recent Results (from the past 240 hour(s))  Blood Culture (routine x 2)     Status: Abnormal   Collection Time: 10/26/21 11:06 PM   Specimen: BLOOD LEFT HAND  Result Value Ref Range Status   Specimen Description BLOOD LEFT  HAND  Final   Special Requests   Final    BOTTLES DRAWN AEROBIC AND ANAEROBIC Blood Culture results may not be optimal due to an inadequate volume of blood received in culture bottles   Culture  Setup Time   Final    GRAM POSITIVE COCCI IN CLUSTERS ANAEROBIC BOTTLE ONLY CRITICAL RESULT CALLED TO, READ BACK BY AND VERIFIED WITH:  C/ PHARMD JAMES L. 10/28/21 0357 A. LAFRANCE    Culture (A)  Final    STAPHYLOCOCCUS AURICULARIS THE SIGNIFICANCE OF ISOLATING THIS ORGANISM FROM A SINGLE SET OF BLOOD CULTURES WHEN MULTIPLE SETS ARE DRAWN IS UNCERTAIN. PLEASE NOTIFY THE MICROBIOLOGY DEPARTMENT WITHIN ONE WEEK IF SPECIATION AND SENSITIVITIES ARE REQUIRED. Performed at [facility], [place] [address] [place], [place] [address]    Report Status 10/30/2021 FINAL  Final  Blood Culture ID Panel (Reflexed)     Status: Abnormal   Collection Time: 10/26/21 11:06 PM  Result Value Ref Range Status   Enterococcus faecalis NOT DETECTED NOT DETECTED Final   Enterococcus Faecium NOT DETECTED NOT DETECTED Final   Listeria monocytogenes NOT DETECTED NOT DETECTED Final   Staphylococcus species DETECTED (A) NOT DETECTED Final    Comment: CRITICAL RESULT CALLED TO, READ BACK BY AND VERIFIED WITH:  C/ PHARMD JAMES L. 10/28/21 0357 A. LAFRANCE    Staphylococcus aureus (BCID) NOT DETECTED NOT DETECTED Final   Staphylococcus epidermidis NOT DETECTED NOT DETECTED Final   Staphylococcus lugdunensis NOT DETECTED NOT DETECTED Final   Streptococcus species  NOT DETECTED NOT DETECTED Final   Streptococcus agalactiae NOT DETECTED NOT DETECTED Final   Streptococcus pneumoniae NOT DETECTED NOT DETECTED Final   Streptococcus pyogenes NOT DETECTED NOT DETECTED Final   A.calcoaceticus-baumannii NOT DETECTED NOT DETECTED Final   Bacteroides fragilis NOT DETECTED NOT DETECTED Final   Enterobacterales NOT DETECTED NOT DETECTED Final   Enterobacter cloacae complex NOT DETECTED NOT DETECTED Final   Escherichia coli NOT DETECTED NOT DETECTED Final   Klebsiella aerogenes NOT DETECTED NOT DETECTED Final   Klebsiella oxytoca NOT DETECTED NOT DETECTED Final   Klebsiella pneumoniae NOT DETECTED NOT DETECTED Final   Proteus species NOT DETECTED NOT DETECTED Final   Salmonella species NOT DETECTED NOT DETECTED Final   Serratia marcescens NOT DETECTED NOT DETECTED Final   Haemophilus influenzae NOT DETECTED NOT DETECTED Final   Neisseria meningitidis NOT DETECTED NOT DETECTED Final   Pseudomonas aeruginosa NOT DETECTED NOT DETECTED Final   Stenotrophomonas maltophilia NOT DETECTED NOT DETECTED Final   Candida albicans NOT DETECTED NOT DETECTED Final   Candida auris NOT DETECTED NOT DETECTED Final   Candida glabrata NOT DETECTED NOT DETECTED Final   Candida krusei NOT DETECTED NOT DETECTED Final   Candida parapsilosis NOT DETECTED NOT DETECTED Final   Candida tropicalis NOT DETECTED NOT DETECTED Final   Cryptococcus neoformans/gattii NOT DETECTED NOT DETECTED Final    Comment: Performed at [facility], [address] [place], [place] [address]  Blood Culture (routine x 2)     Status: None (Preliminary result)   Collection Time: 10/26/21 11:42 PM   Specimen: BLOOD  Result Value Ref Range Status   Specimen Description BLOOD RIGHT UPPER ARM  Final   Special Requests   Final    BOTTLES DRAWN AEROBIC AND ANAEROBIC Blood Culture results may not be optimal due to an inadequate volume of blood received in culture bottles   Culture   Final    NO GROWTH 3  DAYS Performed at [facility]  Lab, [address], [place], [place] [address]    Report Status PENDING  Incomplete  Resp Panel by RT-PCR (Flu A&B, Covid) Anterior Nasal Swab     Status: None   Collection Time: 10/26/21 11:54 PM   Specimen: Anterior Nasal Swab  Result Value Ref Range Status   SARS Coronavirus 2 by RT PCR NEGATIVE NEGATIVE Final    Comment: (NOTE) SARS-CoV-2 target nucleic acids are NOT DETECTED.  The SARS-CoV-2 RNA is generally detectable in upper respiratory specimens during the acute phase of infection. The lowest concentration of SARS-CoV-2 viral copies this assay can detect is 138 copies/mL. A negative result does not preclude SARS-Cov-2 infection and should not be used as the sole basis for treatment or other patient management decisions. A negative result may occur with  improper specimen collection/handling, submission of specimen other than nasopharyngeal swab, presence of viral mutation(s) within the areas targeted by this assay, and inadequate number of viral copies(<138 copies/mL). A negative result must be combined with clinical observations, patient history, and epidemiological information. The expected result is Negative.  Fact Sheet for Patients:  EntrepreneurPulse.com.au  Fact Sheet for Healthcare Providers:  IncredibleEmployment.be  This test is no t yet approved or cleared by the [place] FDA and  has been authorized for detection and/or diagnosis of SARS-CoV-2 by FDA under an Emergency Use Authorization (EUA). This EUA will remain  in effect (meaning this test can be used) for the duration of the COVID-19 declaration under Section 564(b)(1) of the Act, 21 U.S.C.section 360bbb-3(b)(1), unless the authorization is terminated  or revoked sooner.       Influenza A by PCR NEGATIVE NEGATIVE Final   Influenza B by PCR NEGATIVE NEGATIVE Final    Comment: (NOTE) The Xpert Xpress SARS-CoV-2/FLU/RSV plus  assay is intended as an aid in the diagnosis of influenza from Nasopharyngeal swab specimens and should not be used as a sole basis for treatment. Nasal washings and aspirates are unacceptable for Xpert Xpress SARS-CoV-2/FLU/RSV testing.  Fact Sheet for Patients: EntrepreneurPulse.com.au  Fact Sheet for Healthcare Providers: IncredibleEmployment.be  This test is not yet approved or cleared by the [place] FDA and has been authorized for detection and/or diagnosis of SARS-CoV-2 by FDA under an Emergency Use Authorization (EUA). This EUA will remain in effect (meaning this test can be used) for the duration of the COVID-19 declaration under Section 564(b)(1) of the Act, 21 U.S.C. section 360bbb-3(b)(1), unless the authorization is terminated or revoked.  Performed at [facility], [place] [address] [place], [place] [address]   Urine Culture     Status: Abnormal   Collection Time: 10/26/21 11:55 PM   Specimen: Urine, Catheterized  Result Value Ref Range Status   Specimen Description URINE, CATHETERIZED  Final   Special Requests   Final    NONE Performed at [facility], [place] [address] [place], [place] [address]    Culture (A)  Final    >=100,000 COLONIES/mL MULTIPLE SPECIES PRESENT, SUGGEST RECOLLECTION   Report Status 10/28/2021 FINAL  Final  Urine Culture     Status: None   Collection Time: 10/28/21  3:30 PM   Specimen: Urine, Catheterized  Result Value Ref Range Status   Specimen Description URINE, CATHETERIZED  Final   Special Requests NONE  Final   Culture   Final    NO GROWTH Performed at [facility], [address] [place], [place] [address]    Report Status 10/29/2021 FINAL  Final  RADIOLOGY STUDIES/RESULTS: DG CHEST PORT 1 VIEW  Result Date: 10/28/2021 CLINICAL DATA:  Dyspnea EXAM: PORTABLE CHEST 1 VIEW COMPARISON:  10/27/2021 FINDINGS: Persistent but improved patchy density at the lung bases. Lung  volumes remain low. No pleural effusion or pneumothorax. Stable cardiomediastinal contours. IMPRESSION: Persistent but improved patchy density at the lung bases suggesting atelectasis. Electronically Signed   By: Macy Mis M.D.   On: 10/28/2021 15:53     LOS: 3 days   Oren Binet, MD  [facility]    To contact the attending provider between 7A-7P or the covering provider during after hours 7P-7A, please log into the web site [email or web address] and access using universal  password for that web site. If you do not have the password, please call the hospital operator.  10/30/2021, 11:44 AM

## 2021-10-30 NOTE — Progress Notes (Signed)
[facility]) Hospital liaison note:   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 Patient was brought to [facility] ED for altered mental status and possible infection from foley catheter. Hospice was not notified. Patient was admitted on 8.14.23 for sepsis secondary to a urinary tract infection. Per Dr. Tomasa Hosteller with [facility], this is a related admission.   Visited [name] today, Daughter, Jillyn Ledger present at bedside. Patent is non-verbal, lying in bed, eyes open. Foley present, draining clear yellow urine. Patient's limbs are contracted into fetal position with peripheral edema noted. Daughter relayed that physician is ready for discharge, but daughter is not ready today due to her brother, who is the primary care giver, is having problems with his teeth. Will follow up tomorrow.     Mr [name] is inpatient appropriate as he is requiring IV antibiotics for treatment of sepsis.      V/S:  BP:115/81, P:89, RR:24, Temp:98.2, Sp02:100 on RA   I&O: 0/1000   Labs: None today   Diagnostics: None today     IVs/PRNs:    lactated ringers infusion - 113m/hr, continuous, IV   meropenem (MERREM) 1 g in sodium chloride 0.9 % 100 mL IVPB - q12hrs, IV   Problem list:  Assessment/Plan: Sepsis due to complicated UTI: Sepsis physiology has resolved-afebrile-no leukocytosis-remains on meropenem.  Unfortunately-urine cultures grew out multiple bacterial morphotypes-repeat urine culture ordered on 8/15 negative so far (sterile as on antibiotics).  Given prior history of ESBL E. coli-reasonable to treat with meropenem x7 days.     Acute metabolic encephalopathy:Likely due to sepsis/UTI-at baseline very frail/weak-but able to have some minimal conversation.  Currently opens eyes but unclear whether he is able to speak or not.  Daughter at bedside this morning-acknowledges improvement when compared to admission.   History of dementia-severe  failure to thrive syndrome with contractures involving all 4 extremities-PEG tube/chronic indwelling Foley catheter: Followed by hospice at home-Long discussion with daughter at bedside-although she understands poor overall prognosis-very poor quality of life-and at risk for recurrent MDR UTIs/aspiration pneumonia etc. she is not willing to consider full hospice care.  She is very hesitant to take him home on IV antibiotics.  She indicates that she would want him resuscitated in case he decompensates.  Given his frail state-at risk for decompensation-I will attempt to talk with our palliative care team today and see if we can continue to engage with family.   Transaminitis: Likely ischemic hepatitis/shock liver in the setting of sepsis-improved.   History of prostate cancer-chronic indwelling Foley catheter: Foley catheter changed by urology on admission.   Palliative care: See above discussion-very frail-appears to have significant dementia-history of SDH-recurrent falls-now PEG tube-chronic Foley-contracted in all 4 extremities-at risk for recurrent MDR infections-with very poor quality of life.  Long discussion with daughter this morning-although she understands that her father is slowly deteriorating-and is likely to continue to have recurrent infections-she is very hesitant to consider comfort/hospice care-and seems not to fully understand the gravity of his overall clinical condition.  She wanted to find out if his prostate cancer was still active-I proceeded to explain to her that it really does not matter if the cancer was active-as further work-up will not change outcome or management given his overall frail state.  She was also wanting to know if he could swallow-given his advanced dementia-PEG tube in for several years-and his current mental state-I told her that it is unlikely that he  is any shape ready for oral intake at this point.  I will reach out to the palliative care team-and see if we can  continue to engage with family.  Daughter is also very hesitant to take him home with IV antibiotics.   Nutrition Status: Nutrition Problem: Increased nutrient needs Etiology: acute illness Signs/Symptoms: estimated needs Interventions: Refer to RD note for recommendations   Underweight: Estimated body mass index is 19.66 kg/m as calculated from the following:   Height as of this encounter: '5\' 1"'$  (1.549 m).   Weight as of this encounter: 47.2 kg.      D/C planning: Ongoing; Patient can be discharged home when family is ready.     GOC: Patient remains a Full Code currently, family desires full aggressive treatment.      IDT: Updated   Family: Visited with Daughter, [name] at bedside. Daughter relayed physician say patient is ready for discharge, but daughter is not ready today due to no one being home today, since her brother, who is the primary care giver is having problems with his teeth.    Transfer summary and medication list placed in shadow chart.    Should patient need ambulance transfer - Please use [facility]) as they contract this service for our active hospice patients.    [place]  [facility] Liaison  [phone]

## 2021-10-30 NOTE — Progress Notes (Signed)
Nutrition Brief Note  RD reached out to MD to restart tube feeds; MD agreeable.   Initiate home tube feed regimen of: 1 carton Osmolite 1.5 five time per day via PEG 50 mL free water flush before and after each bolus Provides 1775 kcal, 74 gm of protein, and 1505 mL free water daily.  Monitor magnesium, potassium, and phosphorus BID for at least 3 days, MD to replete as needed, as pt is at risk for refeeding syndrome given significant weight loss.  -Multivitamin w/ minerals daily per tube   Medications and labs reviewed.  RD to continue to follow pt.  Hermina Barters RD, LDN Clinical Dietitian See Shea Evans for contact information.

## 2021-10-30 NOTE — Progress Notes (Signed)
This chaplain responded to the consult for creating/updating the Pt. Advance Directive.   The chaplain reviewed the Pt. chart notes and understands the Pt. was brought to the [facility] ED for altered mental status and continues to have minimal verbal ability with a history of dementia.  The chaplain understands the Pt. daughter-Thao is the Pt. surrogate decision maker and is communicating with the medical team.   The chaplain will continue to watch the chart for an opportunity to provide Advance Directive education to the Pt.  Chaplain Sallyanne Kuster [phone]

## 2021-10-30 NOTE — Progress Notes (Signed)
IV/PICC Team at bedside to assessed patient for PICC placement. Due to severity of contractures, we are not able to extend or move arms away from the body enough to place the PICC or midline. MD and Primary RN made aware through secure chat. Recommendation would be to have IR place Tunneled CVC on the chest. IV Team PICC order to be discontinued. Please reach out to IR.

## 2021-10-31 DIAGNOSIS — G9340 Encephalopathy, unspecified: Secondary | ICD-10-CM | POA: Diagnosis not present

## 2021-10-31 DIAGNOSIS — T83511A Infection and inflammatory reaction due to indwelling urethral catheter, initial encounter: Secondary | ICD-10-CM | POA: Diagnosis not present

## 2021-10-31 DIAGNOSIS — R1310 Dysphagia, unspecified: Secondary | ICD-10-CM | POA: Diagnosis not present

## 2021-10-31 DIAGNOSIS — N390 Urinary tract infection, site not specified: Secondary | ICD-10-CM | POA: Diagnosis not present

## 2021-10-31 DIAGNOSIS — R4182 Altered mental status, unspecified: Secondary | ICD-10-CM | POA: Diagnosis not present

## 2021-10-31 DIAGNOSIS — A419 Sepsis, unspecified organism: Secondary | ICD-10-CM | POA: Diagnosis not present

## 2021-10-31 LAB — GLUCOSE, CAPILLARY
Glucose-Capillary: 142 mg/dL — ABNORMAL HIGH (ref 70–99)
Glucose-Capillary: 147 mg/dL — ABNORMAL HIGH (ref 70–99)
Glucose-Capillary: 152 mg/dL — ABNORMAL HIGH (ref 70–99)
Glucose-Capillary: 155 mg/dL — ABNORMAL HIGH (ref 70–99)
Glucose-Capillary: 158 mg/dL — ABNORMAL HIGH (ref 70–99)
Glucose-Capillary: 159 mg/dL — ABNORMAL HIGH (ref 70–99)
Glucose-Capillary: 159 mg/dL — ABNORMAL HIGH (ref 70–99)

## 2021-10-31 LAB — MAGNESIUM
Magnesium: 1.8 mg/dL (ref 1.7–2.4)
Magnesium: 2.5 mg/dL — ABNORMAL HIGH (ref 1.7–2.4)

## 2021-10-31 LAB — PHOSPHORUS
Phosphorus: 1.3 mg/dL — ABNORMAL LOW (ref 2.5–4.6)
Phosphorus: 3.5 mg/dL (ref 2.5–4.6)

## 2021-10-31 MED ORDER — MAGNESIUM SULFATE 4 GM/100ML IV SOLN
4.0000 g | Freq: Once | INTRAVENOUS | Status: AC
  Administered 2021-10-31: 4 g via INTRAVENOUS
  Filled 2021-10-31: qty 100

## 2021-10-31 MED ORDER — GUAIFENESIN 100 MG/5ML PO LIQD
15.0000 mL | Freq: Four times a day (QID) | ORAL | Status: DC | PRN
Start: 2021-10-31 — End: 2021-11-01
  Administered 2021-10-31: 15 mL
  Filled 2021-10-31: qty 15

## 2021-10-31 MED ORDER — POTASSIUM PHOSPHATES 15 MMOLE/5ML IV SOLN
30.0000 mmol | Freq: Once | INTRAVENOUS | Status: AC
  Administered 2021-10-31: 30 mmol via INTRAVENOUS
  Filled 2021-10-31: qty 10

## 2021-10-31 MED ORDER — SODIUM CHLORIDE 0.9 % IV SOLN
1.0000 g | Freq: Once | INTRAVENOUS | Status: AC
  Administered 2021-11-01: 1000 mg via INTRAVENOUS
  Filled 2021-10-31 (×3): qty 1

## 2021-10-31 NOTE — Progress Notes (Signed)
   Referral received for [facility] :goals of care discussion. Chart reviewed and updates received from [facility] hospital liaisons Olivia Mackie and Judson Roch, who were able to continue efforts for ongoing goals of care conversations with daughter [name] Threasa Alpha) today before she left for work. PMT has been asked to engage with family as well.  Patient assessed and is unable to engage appropriately in discussions. Attempted to contact patient's daughter [place]. Unable to reach. Voicemail left with contact information given.   PMT will re-attempt to contact family at a later time/date. Detailed note and recommendations to follow once GOC has been completed.   Thank you for your referral and allowing PMT to assist in Mr. Jahmai Sweigert's care.   Dorthy Cooler, [facility] Palliative Medicine Team  Team Phone # [phone]   NO CHARGE

## 2021-10-31 NOTE — Progress Notes (Signed)
[facility]) Hospital liaison note:   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 Patient was brought to [facility] ED for altered mental status and possible infection from foley catheter. Hospice was not notified. Patient was admitted on 8.14.23 for sepsis secondary to a urinary tract infection. Per Dr. Tomasa Hosteller with [facility], this is a related admission.   Visited [name] today, Daughter, [name] present at bedside. Patient was coughing and after daughter provided mouth care, requiring suctioning. Daughter inquired about getting suction device for in the home. Informed her that we can have one delivered. Patent is non-verbal, lying in bed, eyes open and tracking. Foley present, draining clear yellow urine. Patient's limbs are contracted into fetal position with peripheral edema, bandages present to heel, and outer aspect of foot noted. Daughter relayed that the physician said that the patient may discharge home tomorrow, and that her brother should be fine for him to go home. Daughter expresses that patient is communitive, when she spoke to him he did not acknowledge her. Spoke with daughter about goals of care, daughter wishes to continue with full aggressive treatment.     [name] is inpatient appropriate as he is requiring IV antibiotics for treatment of sepsis.      V/S:  BP:96/61, P:85, RR:22, Temp:98.5, Sp02:100 on RA   I&O: 05/3523   Labs:  Glucose-Capillary: 158 (H)   Diagnostics: None today     IVs/PRNs:    lactated ringers infusion - 21m/hr, continuous, IV   meropenem (MERREM) 1 g in sodium chloride 0.9 % 100 mL IVPB - q12hrs, IV   Problem list:  Assessment/Plan: Sepsis due to complicated UTI: Sepsis physiology has resolved-UTI likely due to underlying chronic indwelling Foley catheter.  Unfortunately, cultures are nondiagnostic-given history of ESBL E. coli-plans are to treat with IV meropenem x7  days-last day of antibiotics 8/19.     Acute metabolic encephalopathy: Likely due to sepsis/UTI-at baseline very frail/weak-but able to have some minimal conversation.  Improving-more awake today-smiling at me.  Family at bedside acknowledges clinical improvement.   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 she is not willing to consider full hospice care.  She is very hesitant to take him home on IV antibiotics.  She indicates that she would want him resuscitated in case he decompensates.  Given his frail state-at risk for decompensation-have formally requested a palliative care team evaluation.     Transaminitis: Likely ischemic hepatitis/shock liver in the setting of sepsis-improved.   History of prostate cancer-chronic indwelling Foley catheter: Foley catheter changed by urology on admission.   Palliative care: See above discussion-very poor prognosis-is bedbound-contractures in all 4 extremities-chronic indwelling Foley-PEG tube in place-at risk for further decompensation-family not willing to consider comfort measures.  I have requested formal palliative care evaluation.     Nutrition Status: Nutrition Problem: Increased nutrient needs Etiology: acute illness Signs/Symptoms: estimated needs Interventions: Refer to RD note for recommendations   Underweight: Estimated body mass index is 21.41 kg/m as calculated from the following:   Height as of this encounter: '5\' 1"'$  (1.549 m).   Weight as of this encounter: 51.4 kg.      D/C planning: Ongoing; Patient may discharge to home tomorrow.  GOC: Patient remains a Full Code currently, family desires full aggressive treatment.      IDT:  Updated   Family: Visited with Daughter, [name] at bedside. Daughter relayed that physician said that patient may discharge tomorrow.    Transfer summary and medication list placed in shadow chart.    Should patient need ambulance transfer - Please use [facility]) as they contract this service for our active hospice patients.    [place]  [facility] Liaison  [phone]

## 2021-10-31 NOTE — Progress Notes (Signed)
PROGRESS NOTE        PATIENT DETAILS Name: [name] Age: 76 y.o. Sex: male Date of Birth: [birth year] Admit Date: 10/26/2021 Admitting Physician Vianne Bulls, MD PCP:No primary care provider on file.  Brief Summary: Patient is a 76 y.o.  male with history of prostate cancer, dementia, failure to thrive syndrome-PEG tube/chronic indwelling Foley catheter in place-chronically contracted upper/lower extremities-followed by hospice at home-presenting with acute metabolic encephalopathy in the setting of complicated UTI.  Significant events: 8/13>> admit to [facility]-complicated UTI/sepsis/acute metabolic encephalopathy.  Significant studies: 8/14>> CXR: Left lung infiltrate. 8/14>> RUQ ultrasound: No cholelithiasis-findings consistent with gallbladder adenomyomatosis.  Significant microbiology data: 8/13>> COVID/influenza PCR: Negative 8/13>> blood culture: 1/2-Gram-positive cocci in clusters-suspicion for coag negative staph-likely contaminant. 8/13>> urine culture:> 100,000 colonies/mL of multiple species  Procedures: None  Consults: Palliative care  Subjective: More awake-nonverbal but smiling at me.  Objective: Vitals: Blood pressure 96/61, pulse 85, temperature 98.5 F (36.9 C), temperature source Axillary, resp. rate (!) 22, height '5\' 1"'$  (1.549 m), weight 51.4 kg, SpO2 100 %.   Exam: Gen Exam: Very frail appearing-contracted-but not in any distress. HEENT:atraumatic, normocephalic Chest: B/L clear to auscultation anteriorly CVS:S1S2 regular Abdomen:soft non tender, non distended Extremities:no edema Skin: no rash   Pertinent Labs/Radiology:    Latest Ref Rng & Units 10/29/2021    4:51 AM 10/28/2021    4:34 AM 10/27/2021    5:15 AM  CBC  WBC 4.0 - 10.5 K/uL 8.6  8.2  9.4   Hemoglobin 13.0 - 17.0 g/dL 9.2  9.2  9.8   Hematocrit 39.0 - 52.0 % 26.2  27.2  29.4   Platelets 150 - 400 K/uL 298  292  294     Lab Results  Component Value Date    NA 136 10/29/2021   K 3.9 10/29/2021   CL 107 10/29/2021   CO2 19 (L) 10/29/2021      Assessment/Plan: Sepsis due to complicated UTI: Sepsis physiology has resolved-UTI likely due to underlying chronic indwelling Foley catheter.  Unfortunately cultures are nondiagnostic-given history of ESBL E. coli-plans are to treat with IV meropenem x7 days-last day of antibiotics 8/19.    Acute metabolic encephalopathy:Likely due to sepsis/UTI-at baseline very frail/weak-but able to have some minimal conversation.  Improving-more awake today-smiling at me.  Family at bedside acknowledges clinical improvement.  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 she is not willing to consider full hospice care.  She is very hesitant to take him home on IV antibiotics.  She indicates that she would want him resuscitated in case he decompensates.  Given his frail state-at risk for decompensation-have formally requested a palliative care team evaluation.    Transaminitis: Likely ischemic hepatitis/shock liver in the setting of sepsis-improved.  History of prostate cancer-chronic indwelling Foley catheter: Foley catheter changed by urology on admission.  Palliative care: See above discussion-very poor prognosis-is bedbound-contractures in all 4 extremities-chronic indwelling Foley-PEG tube in place-at risk for further decompensation-family not willing to consider comfort measures.  I have requested formal palliative care evaluation.    Nutrition Status: Nutrition Problem: Increased nutrient needs Etiology: acute illness Signs/Symptoms: estimated needs Interventions: Refer  to RD note for  recommendations  Underweight: Estimated body mass index is 21.41 kg/m as calculated from the following:   Height as of this encounter: '5\' 1"'$  (1.549 m).   Weight as of this encounter: 51.4 kg.   Code status:   Code Status: Full Code   DVT Prophylaxis: enoxaparin (LOVENOX) injection 30 mg Start: 10/29/21 1000   Family Communication: Daughter at bedside.   Disposition Plan: Status is: Inpatient Remains inpatient appropriate because: Resolving sepsis physiology-requires IV antimicrobial therapy x7 days.   Planned Discharge Destination:Home likely on 8/19.   Diet: Diet Order             Diet NPO time specified  Diet effective now                     Antimicrobial agents: Anti-infectives (From admission, onward)    Start     Dose/Rate Route Frequency Ordered Stop   11/01/21 1000  ertapenem (INVANZ) 1,000 mg in sodium chloride 0.9 % 100 mL IVPB        1 g 200 mL/hr over 30 Minutes Intravenous  Once 10/31/21 1041     10/27/21 0600  meropenem (MERREM) 1 g in sodium chloride 0.9 % 100 mL IVPB        1 g 200 mL/hr over 30 Minutes Intravenous Every 12 hours 10/27/21 0406 10/31/21 2359   10/26/21 2330  cefTRIAXone (ROCEPHIN) 2 g in sodium chloride 0.9 % 100 mL IVPB  Status:  Discontinued        2 g 200 mL/hr over 30 Minutes Intravenous Every 24 hours 10/26/21 2319 10/27/21 0314   10/26/21 2300  ceFEPIme (MAXIPIME) 2 g in sodium chloride 0.9 % 100 mL IVPB        2 g 200 mL/hr over 30 Minutes Intravenous  Once 10/26/21 2255 10/26/21 2356   10/26/21 2300  metroNIDAZOLE (FLAGYL) IVPB 500 mg        500 mg 100 mL/hr over 60 Minutes Intravenous  Once 10/26/21 2255 10/27/21 0039   10/26/21 2300  vancomycin (VANCOCIN) IVPB 1000 mg/200 mL premix  Status:  Discontinued        1,000 mg 200 mL/hr over 60 Minutes Intravenous  Once 10/26/21 2255 10/27/21 0034        MEDICATIONS: Scheduled Meds:  baclofen  5 mg Per Tube TID   Chlorhexidine Gluconate Cloth  6 each Topical Daily    enoxaparin (LOVENOX) injection  30 mg Subcutaneous Q24H   feeding supplement (OSMOLITE 1.5 CAL)  237 mL Per Tube 5 X Daily   free water  100 mL Per Tube 5 X Daily   midodrine  2.5 mg Per Tube TID WC   multivitamin with minerals  1 tablet Per Tube Daily   mouth rinse  15 mL Mouth Rinse 4 times per day   pantoprazole  40 mg Per Tube Daily   sodium chloride flush  3 mL Intravenous Q12H   Continuous Infusions:  [START ON 11/01/2021] ertapenem     lactated ringers 10 mL/hr at 10/31/21 0816   meropenem (MERREM) IV 1 g (10/31/21 0900)   potassium PHOSPHATE IVPB (in mmol) 30 mmol (10/31/21 1139)   PRN Meds:.acetaminophen **OR** acetaminophen, albuterol, fentaNYL (SUBLIMAZE) injection, guaiFENesin, ondansetron **OR** ondansetron (ZOFRAN) IV, mouth rinse   I have personally reviewed following labs and imaging studies  LABORATORY DATA: CBC: Recent Labs  Lab 10/26/21 2343 10/27/21 0515 10/28/21 0434 10/29/21 0451  WBC 8.5 9.4 8.2 8.6  NEUTROABS 7.2  --   --   --  HGB 11.6* 9.8* 9.2* 9.2*  HCT 34.6* 29.4* 27.2* 26.2*  MCV 98.3 98.0 98.6 95.3  PLT 350 294 292 298     Basic Metabolic Panel: Recent Labs  Lab 10/26/21 2343 10/27/21 0515 10/28/21 0434 10/29/21 0451 10/30/21 1203 10/30/21 1617 10/31/21 0358  NA 134* 133* 136 136  --   --   --   K 5.0 4.0 3.9 3.9  --   --   --   CL 102 103 107 107  --   --   --   CO2 23 24 21* 19*  --   --   --   GLUCOSE 106* 107* 70 70  --   --   --   BUN 31* 24* 19 17  --   --   --   CREATININE 1.07 1.02 0.99 1.03  --   --   --   CALCIUM 8.5* 7.8* 8.2* 8.1*  --   --   --   MG  --   --   --   --  1.7 1.6* 1.8  PHOS  --   --   --   --  2.4* 2.3* 1.3*     GFR: Estimated Creatinine Clearance: 45.7 mL/min (by C-G formula based on SCr of 1.03 mg/dL).  Liver Function Tests: Recent Labs  Lab 10/26/21 2343 10/27/21 0515 10/28/21 0434 10/29/21 0451  AST 231* 107* 45* 38  ALT 111* 76* 52* 38  ALKPHOS 301* 212* 170* 151*  BILITOT 1.4*  0.7 0.7 1.5*  PROT 6.3* 4.9* 4.8* 4.6*  ALBUMIN 2.6* 2.0* 1.9* 1.9*    No results for input(s): "LIPASE", "AMYLASE" in the last 168 hours. No results for input(s): "AMMONIA" in the last 168 hours.  Coagulation Profile: Recent Labs  Lab 10/26/21 2343  INR 1.0     Cardiac Enzymes: No results for input(s): "CKTOTAL", "CKMB", "CKMBINDEX", "TROPONINI" in the last 168 hours.  BNP (last 3 results) No results for input(s): "PROBNP" in the last 8760 hours.  Lipid Profile: No results for input(s): "CHOL", "HDL", "LDLCALC", "TRIG", "CHOLHDL", "LDLDIRECT" in the last 72 hours.  Thyroid Function Tests: No results for input(s): "TSH", "T4TOTAL", "FREET4", "T3FREE", "THYROIDAB" in the last 72 hours.  Anemia Panel: No results for input(s): "VITAMINB12", "FOLATE", "FERRITIN", "TIBC", "IRON", "RETICCTPCT" in the last 72 hours.  Urine analysis:    Component Value Date/Time   COLORURINE AMBER (A) 10/26/2021 2354   APPEARANCEUR CLOUDY (A) 10/26/2021 2354   LABSPEC 1.018 10/26/2021 2354   PHURINE 7.0 10/26/2021 2354   GLUCOSEU NEGATIVE 10/26/2021 2354   HGBUR SMALL (A) 10/26/2021 2354   HGBUR negative 05/15/2010 1129   BILIRUBINUR NEGATIVE 10/26/2021 2354   BILIRUBINUR negative 11/18/2017 1115   KETONESUR NEGATIVE 10/26/2021 2354   PROTEINUR 100 (A) 10/26/2021 2354   UROBILINOGEN 0.2 11/18/2017 1115   UROBILINOGEN 0.2 05/06/2014 1648   NITRITE POSITIVE (A) 10/26/2021 2354   LEUKOCYTESUR LARGE (A) 10/26/2021 2354    Sepsis Labs: Lactic Acid, Venous    Component Value Date/Time   LATICACIDVEN 1.0 10/27/2021 0515    MICROBIOLOGY: Recent Results (from the past 240 hour(s))  Blood Culture (routine x 2)     Status: Abnormal   Collection Time: 10/26/21 11:06 PM   Specimen: BLOOD LEFT HAND  Result Value Ref Range Status   Specimen Description BLOOD LEFT HAND  Final   Special Requests   Final    BOTTLES DRAWN AEROBIC AND ANAEROBIC Blood Culture results may not be optimal due to an  inadequate volume of blood received in culture bottles   Culture  Setup Time   Final    GRAM POSITIVE COCCI IN CLUSTERS ANAEROBIC BOTTLE ONLY CRITICAL RESULT CALLED TO, READ BACK BY AND VERIFIED WITH:  C/ PHARMD JAMES L. 10/28/21 0357 A. LAFRANCE    Culture (A)  Final    STAPHYLOCOCCUS AURICULARIS THE SIGNIFICANCE OF ISOLATING THIS ORGANISM FROM A SINGLE SET OF BLOOD CULTURES WHEN MULTIPLE SETS ARE DRAWN IS UNCERTAIN. PLEASE NOTIFY THE MICROBIOLOGY DEPARTMENT WITHIN ONE WEEK IF SPECIATION AND SENSITIVITIES ARE REQUIRED. Performed at [facility], [place] [address], [place], [place] [address]    Report Status 10/30/2021 FINAL  Final  Blood Culture ID Panel (Reflexed)     Status: Abnormal   Collection Time: 10/26/21 11:06 PM  Result Value Ref Range Status   Enterococcus faecalis NOT DETECTED NOT DETECTED Final   Enterococcus Faecium NOT DETECTED NOT DETECTED Final   Listeria monocytogenes NOT DETECTED NOT DETECTED Final   Staphylococcus species DETECTED (A) NOT DETECTED Final    Comment: CRITICAL RESULT CALLED TO, READ BACK BY AND VERIFIED WITH:  C/ PHARMD JAMES L. 10/28/21 0357 A. LAFRANCE    Staphylococcus aureus (BCID) NOT DETECTED NOT DETECTED Final   Staphylococcus epidermidis NOT DETECTED NOT DETECTED Final   Staphylococcus lugdunensis NOT DETECTED NOT DETECTED Final   Streptococcus species NOT DETECTED NOT DETECTED Final   Streptococcus agalactiae NOT DETECTED NOT DETECTED Final   Streptococcus pneumoniae NOT DETECTED NOT DETECTED Final   Streptococcus pyogenes NOT DETECTED NOT DETECTED Final   A.calcoaceticus-baumannii NOT DETECTED NOT DETECTED Final   Bacteroides fragilis NOT DETECTED NOT DETECTED Final   Enterobacterales NOT DETECTED NOT DETECTED Final   Enterobacter cloacae complex NOT DETECTED NOT DETECTED Final   Escherichia coli NOT DETECTED NOT DETECTED Final   Klebsiella aerogenes NOT DETECTED NOT DETECTED Final   Klebsiella oxytoca NOT DETECTED NOT DETECTED Final    Klebsiella pneumoniae NOT DETECTED NOT DETECTED Final   Proteus species NOT DETECTED NOT DETECTED Final   Salmonella species NOT DETECTED NOT DETECTED Final   Serratia marcescens NOT DETECTED NOT DETECTED Final   Haemophilus influenzae NOT DETECTED NOT DETECTED Final   Neisseria meningitidis NOT DETECTED NOT DETECTED Final   Pseudomonas aeruginosa NOT DETECTED NOT DETECTED Final   Stenotrophomonas maltophilia NOT DETECTED NOT DETECTED Final   Candida albicans NOT DETECTED NOT DETECTED Final   Candida auris NOT DETECTED NOT DETECTED Final   Candida glabrata NOT DETECTED NOT DETECTED Final   Candida krusei NOT DETECTED NOT DETECTED Final   Candida parapsilosis NOT DETECTED NOT DETECTED Final   Candida tropicalis NOT DETECTED NOT DETECTED Final   Cryptococcus neoformans/gattii NOT DETECTED NOT DETECTED Final    Comment: Performed at [facility], [address] [place], [place] [address]  Blood Culture (routine x 2)     Status: None (Preliminary result)   Collection Time: 10/26/21 11:42 PM   Specimen: BLOOD  Result Value Ref Range Status   Specimen Description BLOOD RIGHT UPPER ARM  Final   Special Requests   Final    BOTTLES DRAWN AEROBIC AND ANAEROBIC Blood Culture results may not be optimal due to an inadequate volume of blood received in culture bottles   Culture   Final    NO GROWTH 4 DAYS Performed at [facility], [place] [address], [place], [place] [address]    Report Status PENDING  Incomplete  Resp Panel by RT-PCR (Flu A&B, Covid) Anterior Nasal Swab  Status: None   Collection Time: 10/26/21 11:54 PM   Specimen: Anterior Nasal Swab  Result Value Ref Range Status   SARS Coronavirus 2 by RT PCR NEGATIVE NEGATIVE Final    Comment: (NOTE) SARS-CoV-2 target nucleic acids are NOT DETECTED.  The SARS-CoV-2 RNA is generally detectable in upper respiratory specimens during the acute phase of infection. The lowest concentration of SARS-CoV-2 viral copies this assay  can detect is 138 copies/mL. A negative result does not preclude SARS-Cov-2 infection and should not be used as the sole basis for treatment or other patient management decisions. A negative result may occur with  improper specimen collection/handling, submission of specimen other than nasopharyngeal swab, presence of viral mutation(s) within the areas targeted by this assay, and inadequate number of viral copies(<138 copies/mL). A negative result must be combined with clinical observations, patient history, and epidemiological information. The expected result is Negative.  Fact Sheet for Patients:  EntrepreneurPulse.com.au  Fact Sheet for Healthcare Providers:  IncredibleEmployment.be  This test is no t yet approved or cleared by the [place] FDA and  has been authorized for detection and/or diagnosis of SARS-CoV-2 by FDA under an Emergency Use Authorization (EUA). This EUA will remain  in effect (meaning this test can be used) for the duration of the COVID-19 declaration under Section 564(b)(1) of the Act, 21 U.S.C.section 360bbb-3(b)(1), unless the authorization is terminated  or revoked sooner.       Influenza A by PCR NEGATIVE NEGATIVE Final   Influenza B by PCR NEGATIVE NEGATIVE Final    Comment: (NOTE) The Xpert Xpress SARS-CoV-2/FLU/RSV plus assay is intended as an aid in the diagnosis of influenza from Nasopharyngeal swab specimens and should not be used as a sole basis for treatment. Nasal washings and aspirates are unacceptable for Xpert Xpress SARS-CoV-2/FLU/RSV testing.  Fact Sheet for Patients: EntrepreneurPulse.com.au  Fact Sheet for Healthcare Providers: IncredibleEmployment.be  This test is not yet approved or cleared by the [place] FDA and has been authorized for detection and/or diagnosis of SARS-CoV-2 by FDA under an Emergency Use Authorization (EUA). This EUA will  remain in effect (meaning this test can be used) for the duration of the COVID-19 declaration under Section 564(b)(1) of the Act, 21 U.S.C. section 360bbb-3(b)(1), unless the authorization is terminated or revoked.  Performed at [facility], [place] [address], [place], [place] [address]   Urine Culture     Status: Abnormal   Collection Time: 10/26/21 11:55 PM   Specimen: Urine, Catheterized  Result Value Ref Range Status   Specimen Description URINE, CATHETERIZED  Final   Special Requests   Final    NONE Performed at [facility], [place] [address], [place], [place] [address]    Culture (A)  Final    >=100,000 COLONIES/mL MULTIPLE SPECIES PRESENT, SUGGEST RECOLLECTION   Report Status 10/28/2021 FINAL  Final  Urine Culture     Status: None   Collection Time: 10/28/21  3:30 PM   Specimen: Urine, Catheterized  Result Value Ref Range Status   Specimen Description URINE, CATHETERIZED  Final   Special Requests NONE  Final   Culture   Final    NO GROWTH Performed at [facility], [address], [place], [place] [address]    Report Status 10/29/2021 FINAL  Final    RADIOLOGY STUDIES/RESULTS: [name] EKG SITE RITE  Result Date: 10/30/2021 If Site Rite image not attached, placement could not be confirmed due to current cardiac rhythm.    LOS:  4 days   Oren Binet, MD  [facility]    To contact the attending provider between 7A-7P or the covering provider during after hours 7P-7A, please log into the web site [email or web address] and access using universal [place] password for that web site. If you do not have the password, please call the hospital operator.  10/31/2021, 11:50 AM

## 2021-10-31 NOTE — Progress Notes (Signed)
This chaplain responded to RN-Lisa's referral for Advance Directive education. The Pt. daughter-Thao(Sofia) is at the bedside preparing to go to work.  The chaplain understands Threasa Alpha may be looking for POA for the Pt. The chaplain shares the hospital does not provide POA assistance.    The chaplain provided brief education with opportunities to ask clarifying questions on HCPOA and Conception Hierarchy of Decision Makers. The Pt. children will serve in this role without documentation. An AD education document was left with [place]. F/U discussed on Monday.  Chaplain Sallyanne Kuster [phone]

## 2021-11-01 DIAGNOSIS — G9340 Encephalopathy, unspecified: Secondary | ICD-10-CM | POA: Diagnosis not present

## 2021-11-01 DIAGNOSIS — R7989 Other specified abnormal findings of blood chemistry: Secondary | ICD-10-CM | POA: Diagnosis not present

## 2021-11-01 DIAGNOSIS — A419 Sepsis, unspecified organism: Secondary | ICD-10-CM | POA: Diagnosis not present

## 2021-11-01 DIAGNOSIS — R1310 Dysphagia, unspecified: Secondary | ICD-10-CM | POA: Diagnosis not present

## 2021-11-01 DIAGNOSIS — T83511A Infection and inflammatory reaction due to indwelling urethral catheter, initial encounter: Secondary | ICD-10-CM | POA: Diagnosis not present

## 2021-11-01 DIAGNOSIS — R4182 Altered mental status, unspecified: Secondary | ICD-10-CM | POA: Diagnosis not present

## 2021-11-01 LAB — GLUCOSE, CAPILLARY
Glucose-Capillary: 132 mg/dL — ABNORMAL HIGH (ref 70–99)
Glucose-Capillary: 119 mg/dL — ABNORMAL HIGH (ref 70–99)

## 2021-11-01 LAB — CULTURE, BLOOD (ROUTINE X 2): Culture: NO GROWTH

## 2021-11-01 MED ORDER — BACLOFEN 10 MG PO TABS
5.0000 mg | ORAL_TABLET | Freq: Three times a day (TID) | ORAL | 2 refills | Status: DC
Start: 2021-11-01 — End: 2022-04-25

## 2021-11-01 MED ORDER — FAMOTIDINE 20 MG PO TABS: 20.0000 mg | ORAL_TABLET | Freq: Two times a day (BID) | ORAL | 0 refills | Status: DC

## 2021-11-01 MED ORDER — ALBUTEROL SULFATE HFA 108 (90 BASE) MCG/ACT IN AERS
2.0000 | INHALATION_SPRAY | RESPIRATORY_TRACT | 2 refills | Status: DC | PRN
Start: 2021-11-01 — End: 2022-04-27

## 2021-11-01 MED ORDER — SODIUM CHLORIDE 0.9 % IV SOLN: 1.0000 g | Freq: Once | INTRAVENOUS | Status: DC

## 2021-11-01 MED ORDER — MIDODRINE HCL 2.5 MG PO TABS: 2.5000 mg | ORAL_TABLET | Freq: Three times a day (TID) | ORAL | 2 refills | Status: DC

## 2021-11-01 NOTE — Plan of Care (Signed)
[name], is a 76 y.o. 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  Is PEG tube dependent for feeding, has chronic indwelling Foley catheter.  He was admitted to the hospital due to sepsis caused by complicated UTI in the setting of indwelling Foley catheter.  He has been treated appropriately for the above condition with antibiotics and now sepsis pathophysiology resolved, he has been afebrile, no leukocytosis, close to his baseline.  He qualifies for SNF but family does not want him to be placed at SNF as they do not think he gets good care there, medically stable for discharge with the full knowledge that he has very poor underlying baseline health condition.  Of note patient is being followed by home hospice.  Daughter was bedside when I examined the patient, she had some unrealistic healthcare outcome expectations and was politely informed about my opinion as above.  She was also extremely rough in her conversations with the staff and me.  Patient was seen by me as she wanted a second opinion today about her dad's overall health condition.   Vitals:   10/31/21 2000 11/01/21 0000 11/01/21 0349 11/01/21 0500  BP: 1'02/76 99/67 97/62 '$   Pulse: (!) 112 98 72   Resp: (!) 21 (!) 22 20   Temp: 100 F (37.8 C) 99.6 F (37.6 C) 98.6 F (37 C)   TempSrc: Axillary Axillary Axillary   SpO2: 99% 99% 100%   Weight:    51 kg  Height:       Radiology Reports [name] EKG SITE RITE  Result Date: 10/30/2021 If Site Rite image not attached, placement could not be confirmed due to current cardiac rhythm.  DG CHEST PORT 1 VIEW  Result Date: 10/28/2021 CLINICAL DATA:  Dyspnea  EXAM: PORTABLE CHEST 1 VIEW COMPARISON:  10/27/2021 FINDINGS: Persistent but improved patchy density at the lung bases. Lung volumes remain low. No pleural effusion or pneumothorax. Stable cardiomediastinal contours. IMPRESSION: Persistent but improved patchy density at the lung bases suggesting atelectasis. Electronically Signed   By: Macy Mis M.D.   On: 10/28/2021 15:53   US Abdomen Limited RUQ (LIVER/GB)  Result Date: 10/27/2021 CLINICAL DATA:  Severe sepsis with elevated liver function tests. EXAM: ULTRASOUND ABDOMEN LIMITED RIGHT UPPER QUADRANT COMPARISON:  None Available. FINDINGS: Gallbladder: No gallstones or wall thickening visualized (2.6 mm). Tiny echogenic foci with associated comet tail artifact are seen within a portion of the gallbladder wall. The presence or absence of a sonographic Murphy's sign cannot be determined secondary to limitations of the study. Common bile duct: Diameter: 2.3 mm Liver: Markedly limited in evaluation without evidence of focal lesions. Within normal limits in parenchymal echogenicity. Portal vein is patent on color Doppler imaging with normal direction of blood flow towards the liver. Other: It should be noted that the study is limited secondary to the patient's condition (patient is contracted with knees up to his chest). IMPRESSION: 1. Markedly limited study, as described above, without evidence of cholelithiasis. 2. 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 Electronically Signed   By: Virgina Norfolk M.D.   On: 10/27/2021 05:08   DG Chest 1 View  Result Date: 10/27/2021 CLINICAL DATA:  Sepsis EXAM: CHEST  1 VIEW COMPARISON:  Radiographs 07/22/2021 FINDINGS: Hazy airspace  opacities in the left lower lung are new since 07/22/2021. The left costophrenic angle is not included in the image. No definite pleural effusion. No pneumothorax.Stable cardiomediastinal silhouette. Prominent central pulmonary arteries. Aortic calcification. IMPRESSION:  New left lower lung infiltrates could be due to pneumonia versus atelectasis. Aortic Atherosclerosis (ICD10-I70.0). Electronically Signed   By: Placido Sou M.D.   On: 10/27/2021 00:49    CBC Recent Labs  Lab 10/26/21 2343 10/27/21 0515 10/28/21 0434 10/29/21 0451  WBC 8.5 9.4 8.2 8.6  HGB 11.6* 9.8* 9.2* 9.2*  HCT 34.6* 29.4* 27.2* 26.2*  PLT 350 294 292 298  MCV 98.3 98.0 98.6 95.3  MCH 33.0 32.7 33.3 33.5  MCHC 33.5 33.3 33.8 35.1  RDW 12.0 11.9 11.9 11.9  LYMPHSABS 0.8  --   --   --   MONOABS 0.4  --   --   --   EOSABS 0.0  --   --   --   BASOSABS 0.0  --   --   --     Chemistries  Recent Labs  Lab 10/26/21 2343 10/27/21 0515 10/28/21 0434 10/29/21 0451 10/30/21 1203 10/30/21 1617 10/31/21 0358 10/31/21 1746  NA 134* 133* 136 136  --   --   --   --   K 5.0 4.0 3.9 3.9  --   --   --   --   CL 102 103 107 107  --   --   --   --   CO2 23 24 21* 19*  --   --   --   --   GLUCOSE 106* 107* 70 70  --   --   --   --   BUN 31* 24* 19 17  --   --   --   --   CREATININE 1.07 1.02 0.99 1.03  --   --   --   --   CALCIUM 8.5* 7.8* 8.2* 8.1*  --   --   --   --   MG  --   --   --   --  1.7 1.6* 1.8 2.5*  AST 231* 107* 45* 38  --   --   --   --   ALT 111* 76* 52* 38  --   --   --   --   ALKPHOS 301* 212* 170* 151*  --   --   --   --   BILITOT 1.4* 0.7 0.7 1.5*  --   --   --   --    ------------------------------------------------------------------------------------------------------------------ estimated creatinine clearance is 45.4 mL/min (by C-G formula based on SCr of 1.03 mg/dL). ------------------------------------------------------------------------------------------------------------------ No results for input(s): "HGBA1C" in the last 72 hours. ------------------------------------------------------------------------------------------------------------------ No results for input(s): "CHOL", "HDL", "LDLCALC", "TRIG", "CHOLHDL", "LDLDIRECT" in the last 72  hours. ------------------------------------------------------------------------------------------------------------------ No results for input(s): "TSH", "T4TOTAL", "T3FREE", "THYROIDAB" in the last 72 hours.  Invalid input(s): "FREET3" ------------------------------------------------------------------------------------------------------------------ No results for input(s): "VITAMINB12", "FOLATE", "FERRITIN", "TIBC", "IRON", "RETICCTPCT" in the last 72 hours.  Coagulation profile Recent Labs  Lab 10/26/21 2343  INR 1.0    No results for input(s): "DDIMER" in the last 72 hours.  Cardiac Enzymes No results for input(s): "CKMB", "TROPONINI", "MYOGLOBIN" in the last 168 hours.  Invalid input(s): "CK" ------------------------------------------------------------------------------------------------------------------ Invalid input(s): "POCBNP"   Signature  Lala Lund M.D on 11/01/2021 at 10:52 AM   -  To page go to [email or web address]

## 2021-11-01 NOTE — Discharge Summary (Addendum)
PATIENT DETAILS Name: [name] Age: 76 y.o. Sex: male Date of Birth: Aug 30, 1945 MRN: [id number]. Admitting Physician: Vianne Bulls, MD PCP:No primary care provider on file.  Admit Date: 10/26/2021 Discharge date: 11/01/2021  Recommendations for Outpatient Follow-up:  Needs ongoing goals of care discussion-very poor prognosis-at risk for recurrent infections with MDR organisms.  At risk for recurrent hospitalizations/decompensations.  Admitted From:  Home  Disposition: Home (family wants to take patient home-does not want SNF)   Discharge Condition: poor  CODE STATUS:   Code Status: Full Code   Diet recommendation:  Diet Order             Diet NPO time specified  Diet effective now                    Brief Summary: Patient is a 76 y.o.  male with history of prostate cancer, dementia, failure to thrive syndrome-PEG tube/chronic indwelling Foley catheter in place-chronically contracted upper/lower extremities-followed by hospice at home-presenting with acute metabolic encephalopathy in the setting of complicated UTI.   Significant events: 8/13>> admit to [facility]-complicated UTI/sepsis/acute metabolic encephalopathy.   Significant studies: 8/14>> CXR: Left lung infiltrate. 8/14>> RUQ ultrasound: No cholelithiasis-findings consistent with gallbladder adenomyomatosis.   Significant microbiology data: 8/13>> COVID/influenza PCR: Negative 8/13>> blood culture: 1/2-Staph auricularis (contaminant) 8/13>> urine culture:> 100,000 colonies/mL of multiple species 8/15>> urine culture: No growth   Procedures: None   Consults: Palliative care  Brief Hospital Course: Sepsis due to complicated UTI: Sepsis physiology has resolved-UTI likely due to underlying chronic indwelling Foley catheter.  Unfortunately cultures are nondiagnostic-given history of ESBL E. coli-patient was empirically treated with Carbapenem.  Will complete last day of antibiotic on 8/19.   Acute  metabolic encephalopathy:Likely due to sepsis/UTI-at baseline very frail/weak-but able to have some minimal conversation.  Encephalopathy has improved-suspect he is back to baseline-he is awake-and smiles at me.  He has been nonverbal throughout this hospitalization.  Family at bedside over the past few days have acknowledged overall clinical improvement compared to how he first presented to the hospital.  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 she is not willing to consider full hospice care.  She indicates that she would want him resuscitated in case he decompensates.  Given his frail state-at risk for decompensation-recurrent hospitalizations-palliative care consulted during this hospitalization.  Per daughter-several years ago he was at a local SNF-where he fell and developed contractures.   Transaminitis: Likely ischemic hepatitis/shock liver in the setting of sepsis-improved.  Borderline hypotension: Given his overall clinical state-suspect this is his baseline-continue midodrine as previous.   History of prostate cancer-chronic indwelling Foley catheter: Foley catheter changed by urology on admission.  Please continue to change Foley catheter monthly.  The daughter during one of her conversations-wanted to see how active his prostate cancer was-given his current clinical state-it was explained to her that irrespective of his prostate cancer history-his prognosis was very poor-patient is contracted and in a fetal position-bedbound-PEG-chronic Foley-do not think he will be a candidate for any sort of therapy even if he had active  prostate cancer.   Palliative care: See above discussion-very poor prognosis-is bedbound-contractures in all 4 extremities (almost in a fetal position 24/7)-chronic indwelling  Foley-PEG tube in place-at risk for further decompensation-family not willing to consider comfort measures.  Family/daughter aware that apart from treating infectious/reversible issues-really no role for any further escalation in care as he will not likely survive a significant decompensation event.  Unfortunately-daughter not willing to consider comfort measures at this point.  Patient is followed by outpatient hospice-suspect will need ongoing goals of care discussion/engagement with family.  Overall prognosis is very grim-and he remains at high risk for recurrent infections/recurrent hospitalizations etc.  Note-this morning-patient's daughter indicated that she wanted another MD to see-I asked Dr. Candiss Norse to provide a second opinion.  He concurred with the above outlined plan of care.   Nutrition Status: Nutrition Problem: Increased nutrient needs Etiology: acute illness Signs/Symptoms: estimated needs Interventions: Refer to RD note for recommendations   Underweight: Estimated body mass index is 21.41 kg/m as calculated from the following:   Height as of this encounter: '5\' 1"'$  (1.549 m).   Weight as of this encounter: 51.4 kg.   Discharge Diagnoses:  Principal Problem:   Severe sepsis ([place]) Active Problems:   UTI (urinary tract infection)    Dysphagia   Elevated LFTs   Acute encephalopathy   Discharge Instructions:  Activity:  As tolerated   Discharge Instructions     Call MD for:  difficulty breathing, headache or visual disturbances   Complete by: As directed    Discharge instructions   Complete by: As directed    Follow with Primary MD in 1-2 weeks  Resume PEG tube feeding as previous.  Please get a complete blood count and chemistry panel checked by your Primary MD at your next visit, and again  as instructed by your Primary MD.  Get Medicines reviewed and adjusted: Please take all your medications with you for your next visit with your Primary MD  Laboratory/radiological data: Please request your Primary MD to go over all hospital tests and procedure/radiological results at the follow up, please ask your Primary MD to get all Hospital records sent to his/her office.  In some cases, they will be blood work, cultures and biopsy results pending at the time of your discharge. Please request that your primary care M.D. follows up on these results.  Also Note the following: If you experience worsening of your admission symptoms, develop shortness of breath, life threatening emergency, suicidal or homicidal thoughts you must seek medical attention immediately by calling 911 or calling your MD immediately  if symptoms less severe.  You must read complete instructions/literature along with all the possible adverse reactions/side effects for all the Medicines you take and that have been prescribed to you. Take any new Medicines after you have completely understood and accpet all the possible adverse reactions/side effects.   Do not drive when taking Pain medications or sleeping medications (Benzodaizepines)  Do not take more than prescribed Pain, Sleep and Anxiety Medications. It is not advisable to combine anxiety,sleep and pain medications without talking with your primary care practitioner  Special Instructions: If you have smoked or chewed Tobacco  in the last 2 yrs please stop smoking, stop any regular Alcohol  and or any Recreational drug use.  Wear Seat belts while driving.  Please note: You were cared for by a hospitalist during your hospital stay. Once you are discharged, your primary care physician will handle any further medical issues. Please note that NO REFILLS for any discharge medications will be authorized once you are discharged, as it is imperative that you return to your  primary  care physician (or establish a relationship with a primary care physician if you do not have one) for your post hospital discharge needs so that they can reassess your need for medications and monitor your lab values.   Increase activity slowly   Complete by: As directed       Allergies as of 11/01/2021   No Known Allergies      Medication List     STOP taking these medications    Geri-Tussin DM 10-100 MG/5ML liquid Generic drug: Dextromethorphan-guaiFENesin       TAKE these medications    albuterol 108 (90 Base) MCG/ACT inhaler Commonly known as: VENTOLIN HFA Inhale 2 puffs into the lungs every 4 (four) hours as needed for wheezing or shortness of breath.   baclofen 10 MG tablet Commonly known as: LIORESAL Place 0.5 tablets (5 mg total) into feeding tube 3 (three) times daily.   famotidine 20 MG tablet Commonly known as: PEPCID Place 1 tablet (20 mg total) into feeding tube 2 (two) times daily.   midodrine 2.5 MG tablet Commonly known as: PROAMATINE Place 1 tablet (2.5 mg total) into feeding tube 3 (three) times daily with meals.        Follow-up Information     Primary care MD.. Schedule an appointment as soon as possible for a visit in 1 week(s).                 No Known Allergies   Other Procedures/Studies: [name] EKG SITE RITE  Result Date: 10/30/2021 If Site Rite image not attached, placement could not be confirmed due to current cardiac rhythm.  DG CHEST PORT 1 VIEW  Result Date: 10/28/2021 CLINICAL DATA:  Dyspnea EXAM: PORTABLE CHEST 1 VIEW COMPARISON:  10/27/2021 FINDINGS: Persistent but improved patchy density at the lung bases. Lung volumes remain low. No pleural effusion or pneumothorax. Stable cardiomediastinal contours. IMPRESSION: Persistent but improved patchy density at the lung bases suggesting atelectasis. Electronically Signed   By: Macy Mis M.D.   On: 10/28/2021 15:53   US Abdomen Limited RUQ (LIVER/GB)  Result Date:  10/27/2021 CLINICAL DATA:  Severe sepsis with elevated liver function tests. EXAM: ULTRASOUND ABDOMEN LIMITED RIGHT UPPER QUADRANT COMPARISON:  None Available. FINDINGS: Gallbladder: No gallstones or wall thickening visualized (2.6 mm). Tiny echogenic foci with associated comet tail artifact are seen within a portion of the gallbladder wall. The presence or absence of a sonographic Murphy's sign cannot be determined secondary to limitations of the study. Common bile duct: Diameter: 2.3 mm Liver: Markedly limited in evaluation without evidence of focal lesions. Within normal limits in parenchymal echogenicity. Portal vein is patent on color Doppler imaging with normal direction of blood flow towards the liver. Other: It should be noted that the study is limited secondary to the patient's condition (patient is contracted with knees up to his chest). IMPRESSION: 1. Markedly limited study, as described above, without evidence of cholelithiasis. 2. 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 Electronically Signed   By: Virgina Norfolk M.D.   On: 10/27/2021 05:08   DG Chest 1 View  Result Date: 10/27/2021 CLINICAL DATA:  Sepsis EXAM: CHEST  1 VIEW COMPARISON:  Radiographs 07/22/2021 FINDINGS: Hazy airspace opacities in the left lower lung are new since 07/22/2021. The left costophrenic angle is not included in the image. No definite pleural effusion. No pneumothorax.Stable cardiomediastinal silhouette. Prominent central pulmonary arteries. Aortic calcification. IMPRESSION: New left lower lung infiltrates could be due to pneumonia versus atelectasis. Aortic Atherosclerosis (ICD10-I70.0). Electronically Signed  By: Placido Sou M.D.   On: 10/27/2021 00:49     TODAY-DAY OF DISCHARGE:  Subjective:   [place] Dani today remains unchanged-will open eyes-nonverbal-smiles very briefly.  Objective:   Blood pressure 97/62, pulse 72, temperature 98.6 F (37 C), temperature source Axillary, resp.  rate 20, height '5\' 1"'$  (1.549 m), weight 51 kg, SpO2 100 %.  Intake/Output Summary (Last 24 hours) at 11/01/2021 0918 Last data filed at 11/01/2021 0522 Gross per 24 hour  Intake 0 ml  Output 2100 ml  Net -2100 ml   Filed Weights   10/29/21 0626 10/31/21 0500 11/01/21 0500  Weight: 47.2 kg 51.4 kg 51 kg    Exam: Awake-not in any distress.  Smiles at me. In almost a fetal position-with contractures Chest: Clear to auscultation anteriorly Abdomen: Soft nontender-PEG tube in place Extremities: All 4 extremities contracted-no edema   PERTINENT RADIOLOGIC STUDIES: [name] EKG SITE RITE  Result Date: 10/30/2021 If Site Rite image not attached, placement could not be confirmed due to current cardiac rhythm.    PERTINENT LAB RESULTS: CBC: No results for input(s): "WBC", "HGB", "HCT", "PLT" in the last 72 hours. CMET CMP     Component Value Date/Time   NA 136 10/29/2021 0451   NA 144 11/18/2017 1124   K 3.9 10/29/2021 0451   CL 107 10/29/2021 0451   CO2 19 (L) 10/29/2021 0451   GLUCOSE 70 10/29/2021 0451   BUN 17 10/29/2021 0451   BUN 15 11/18/2017 1124   CREATININE 1.03 10/29/2021 0451   CALCIUM 8.1 (L) 10/29/2021 0451   PROT 4.6 (L) 10/29/2021 0451   ALBUMIN 1.9 (L) 10/29/2021 0451   AST 38 10/29/2021 0451   ALT 38 10/29/2021 0451   ALKPHOS 151 (H) 10/29/2021 0451   BILITOT 1.5 (H) 10/29/2021 0451   GFRNONAA >60 10/29/2021 0451   GFRAA 56 (L) 05/25/2018 1434    GFR Estimated Creatinine Clearance: 45.4 mL/min (by C-G formula based on SCr of 1.03 mg/dL). No results for input(s): "LIPASE", "AMYLASE" in the last 72 hours. No results for input(s): "CKTOTAL", "CKMB", "CKMBINDEX", "TROPONINI" in the last 72 hours. Invalid input(s): "POCBNP" No results for input(s): "DDIMER" in the last 72 hours. No results for input(s): "HGBA1C" in the last 72 hours. No results for input(s): "CHOL", "HDL", "LDLCALC", "TRIG", "CHOLHDL", "LDLDIRECT" in the last 72 hours. No results for  input(s): "TSH", "T4TOTAL", "T3FREE", "THYROIDAB" in the last 72 hours.  Invalid input(s): "FREET3" No results for input(s): "VITAMINB12", "FOLATE", "FERRITIN", "TIBC", "IRON", "RETICCTPCT" in the last 72 hours. Coags: No results for input(s): "INR" in the last 72 hours.  Invalid input(s): "PT" Microbiology: Recent Results (from the past 240 hour(s))  Blood Culture (routine x 2)     Status: Abnormal   Collection Time: 10/26/21 11:06 PM   Specimen: BLOOD LEFT HAND  Result Value Ref Range Status   Specimen Description BLOOD LEFT HAND  Final   Special Requests   Final    BOTTLES DRAWN AEROBIC AND ANAEROBIC Blood Culture results may not be optimal due to an inadequate volume of blood received in culture bottles   Culture  Setup Time   Final    GRAM POSITIVE COCCI IN CLUSTERS ANAEROBIC BOTTLE ONLY CRITICAL RESULT CALLED TO, READ BACK BY AND VERIFIED WITH:  C/ PHARMD JAMES L. 10/28/21 0357 A. LAFRANCE    Culture (A)  Final    STAPHYLOCOCCUS AURICULARIS THE SIGNIFICANCE OF ISOLATING THIS ORGANISM FROM A SINGLE SET OF BLOOD CULTURES WHEN MULTIPLE SETS ARE DRAWN IS  UNCERTAIN. PLEASE NOTIFY THE MICROBIOLOGY DEPARTMENT WITHIN ONE WEEK IF SPECIATION AND SENSITIVITIES ARE REQUIRED. Performed at [facility], [place] [address] [place], [place] [address]    Report Status 10/30/2021 FINAL  Final  Blood Culture ID Panel (Reflexed)     Status: Abnormal   Collection Time: 10/26/21 11:06 PM  Result Value Ref Range Status   Enterococcus faecalis NOT DETECTED NOT DETECTED Final   Enterococcus Faecium NOT DETECTED NOT DETECTED Final   Listeria monocytogenes NOT DETECTED NOT DETECTED Final   Staphylococcus species DETECTED (A) NOT DETECTED Final    Comment: CRITICAL RESULT CALLED TO, READ BACK BY AND VERIFIED WITH:  C/ PHARMD JAMES L. 10/28/21 0357 A. LAFRANCE    Staphylococcus aureus (BCID) NOT DETECTED NOT DETECTED Final   Staphylococcus epidermidis NOT DETECTED NOT DETECTED Final    Staphylococcus lugdunensis NOT DETECTED NOT DETECTED Final   Streptococcus species NOT DETECTED NOT DETECTED Final   Streptococcus agalactiae NOT DETECTED NOT DETECTED Final   Streptococcus pneumoniae NOT DETECTED NOT DETECTED Final   Streptococcus pyogenes NOT DETECTED NOT DETECTED Final   A.calcoaceticus-baumannii NOT DETECTED NOT DETECTED Final   Bacteroides fragilis NOT DETECTED NOT DETECTED Final   Enterobacterales NOT DETECTED NOT DETECTED Final   Enterobacter cloacae complex NOT DETECTED NOT DETECTED Final   Escherichia coli NOT DETECTED NOT DETECTED Final   Klebsiella aerogenes NOT DETECTED NOT DETECTED Final   Klebsiella oxytoca NOT DETECTED NOT DETECTED Final   Klebsiella pneumoniae NOT DETECTED NOT DETECTED Final   Proteus species NOT DETECTED NOT DETECTED Final   Salmonella species NOT DETECTED NOT DETECTED Final   Serratia marcescens NOT DETECTED NOT DETECTED Final   Haemophilus influenzae NOT DETECTED NOT DETECTED Final   Neisseria meningitidis NOT DETECTED NOT DETECTED Final   Pseudomonas aeruginosa NOT DETECTED NOT DETECTED Final   Stenotrophomonas maltophilia NOT DETECTED NOT DETECTED Final   Candida albicans NOT DETECTED NOT DETECTED Final   Candida auris NOT DETECTED NOT DETECTED Final   Candida glabrata NOT DETECTED NOT DETECTED Final   Candida krusei NOT DETECTED NOT DETECTED Final   Candida parapsilosis NOT DETECTED NOT DETECTED Final   Candida tropicalis NOT DETECTED NOT DETECTED Final   Cryptococcus neoformans/gattii NOT DETECTED NOT DETECTED Final    Comment: Performed at [facility], [address] [place], [place] [address]  Blood Culture (routine x 2)     Status: None   Collection Time: 10/26/21 11:42 PM   Specimen: BLOOD  Result Value Ref Range Status   Specimen Description BLOOD RIGHT UPPER ARM  Final   Special Requests   Final    BOTTLES DRAWN AEROBIC AND ANAEROBIC Blood Culture results may not be optimal due to an inadequate volume of blood  received in culture bottles   Culture   Final    NO GROWTH 5 DAYS Performed at [facility], [place] [address] [place],  [address]    Report Status 11/01/2021 FINAL  Final  Resp Panel by RT-PCR (Flu A&B, Covid) Anterior Nasal Swab     Status: None   Collection Time: 10/26/21 11:54 PM   Specimen: Anterior Nasal Swab  Result Value Ref Range Status   SARS Coronavirus 2 by RT PCR NEGATIVE NEGATIVE Final    Comment: (NOTE) SARS-CoV-2 target nucleic acids are NOT DETECTED.  The SARS-CoV-2 RNA is generally detectable in upper respiratory specimens during the acute phase of infection. The lowest concentration of SARS-CoV-2 viral copies this assay can detect is 138 copies/mL. A  negative result does not preclude SARS-Cov-2 infection and should not be used as the sole basis for treatment or other patient management decisions. A negative result may occur with  improper specimen collection/handling, submission of specimen other than nasopharyngeal swab, presence of viral mutation(s) within the areas targeted by this assay, and inadequate number of viral copies(<138 copies/mL). A negative result must be combined with clinical observations, patient history, and epidemiological information. The expected result is Negative.  Fact Sheet for Patients:  EntrepreneurPulse.com.au  Fact Sheet for Healthcare Providers:  IncredibleEmployment.be  This test is no t yet approved or cleared by the [place] FDA and  has been authorized for detection and/or diagnosis of SARS-CoV-2 by FDA under an Emergency Use Authorization (EUA). This EUA will remain  in effect (meaning this test can be used) for the duration of the COVID-19 declaration under Section 564(b)(1) of the Act, 21 U.S.C.section 360bbb-3(b)(1), unless the authorization is terminated  or revoked sooner.       Influenza A by PCR NEGATIVE NEGATIVE Final   Influenza B by PCR NEGATIVE NEGATIVE  Final    Comment: (NOTE) The Xpert Xpress SARS-CoV-2/FLU/RSV plus assay is intended as an aid in the diagnosis of influenza from Nasopharyngeal swab specimens and should not be used as a sole basis for treatment. Nasal washings and aspirates are unacceptable for Xpert Xpress SARS-CoV-2/FLU/RSV testing.  Fact Sheet for Patients: EntrepreneurPulse.com.au  Fact Sheet for Healthcare Providers: IncredibleEmployment.be  This test is not yet approved or cleared by the [place] FDA and has been authorized for detection and/or diagnosis of SARS-CoV-2 by FDA under an Emergency Use Authorization (EUA). This EUA will remain in effect (meaning this test can be used) for the duration of the COVID-19 declaration under Section 564(b)(1) of the Act, 21 U.S.C. section 360bbb-3(b)(1), unless the authorization is terminated or revoked.  Performed at [facility], [place] [address] [place], [place] [address]   Urine Culture     Status: Abnormal   Collection Time: 10/26/21 11:55 PM   Specimen: Urine, Catheterized  Result Value Ref Range Status   Specimen Description URINE, CATHETERIZED  Final   Special Requests   Final    NONE Performed at [facility], [place] [address], [place], [place] [address]    Culture (A)  Final    >=100,000 COLONIES/mL MULTIPLE SPECIES PRESENT, SUGGEST RECOLLECTION   Report Status 10/28/2021 FINAL  Final  Urine Culture     Status: None   Collection Time: 10/28/21  3:30 PM   Specimen: Urine, Catheterized  Result Value Ref Range Status   Specimen Description URINE, CATHETERIZED  Final   Special Requests NONE  Final   Culture   Final    NO GROWTH Performed at [facility], [address], [place], [place] [address]    Report Status 10/29/2021 FINAL  Final    FURTHER DISCHARGE INSTRUCTIONS:  Get Medicines reviewed and adjusted: Please take all your medications with you for your next visit with your Primary  MD  Laboratory/radiological data: Please request your Primary MD to go over all hospital tests and procedure/radiological results at the follow up, please ask your Primary MD to get all Hospital records sent to his/her office.  In some cases, they will be blood work, cultures and biopsy results pending at the time of your discharge. Please request that your primary care M.D. goes through all the records of your hospital data and follows up on these results.  Also Note the following:  If you experience worsening of your admission symptoms, develop shortness of breath, life threatening emergency, suicidal or homicidal thoughts you must seek medical attention immediately by calling 911 or calling your MD immediately  if symptoms less severe.  You must read complete instructions/literature along with all the possible adverse reactions/side effects for all the Medicines you take and that have been prescribed to you. Take any new Medicines after you have completely understood and accpet all the possible adverse reactions/side effects.   Do not drive when taking Pain medications or sleeping medications (Benzodaizepines)  Do not take more than prescribed Pain, Sleep and Anxiety Medications. It is not advisable to combine anxiety,sleep and pain medications without talking with your primary care practitioner  Special Instructions: If you have smoked or chewed Tobacco  in the last 2 yrs please stop smoking, stop any regular Alcohol  and or any Recreational drug use.  Wear Seat belts while driving.  Please note: You were cared for by a hospitalist during your hospital stay. Once you are discharged, your primary care physician will handle any further medical issues. Please note that NO REFILLS for any discharge medications will be authorized once you are discharged, as it is imperative that you return to your primary care physician (or establish a relationship with a primary care physician if you do not have  one) for your post hospital discharge needs so that they can reassess your need for medications and monitor your lab values.  Total Time spent coordinating discharge including counseling, education and face to face time equals greater than 30 minutes.  SignedOren Binet 11/01/2021 9:18 AM

## 2021-11-01 NOTE — TOC Transition Note (Addendum)
Transition of Care [facility]) - CM/SW Discharge Note   Patient Details  Name: [name] MRN: [id number] Date of Birth: 02-22-1946  Transition of Care [facility]) CM/SW Contact:  Carles Collet, RN Phone Number: 11/01/2021, 10:03 AM   Clinical Narrative:    Notified by MD that patient will Dc to home toady and resume home hospice services. Patient has been reviewed by two hospitalists who spoke with the daughter and cleared him to discharge. Spoke with patient's daughter Trina Ao who expressed concerns about DC. Allowed her time to go over concerns with stay and apologized for her offense. Provided her with guidance to call patient experience. Reviewed [employer] appeal process with her, reviewed IM, DND, and HINN 12. Instructed her to call [employer] and leave VM.  Provided forms to her at bedside, and assisted with call to [facility] at bedside on speakerphone. She refused to speak to [facility]. Informed her that if [employer] appeal is not started then patient would need to DC. She stated, "then call the ambulance." I informed her that I understood that she now wanted to discharge and have the ambulance called. She repeated instructions to call ambulance and then threatened that if "anythng happens" it is the hospital's fault. Repeated to her that she can appeal DC, and it would require a call to [facility]. CM in room offerring assitance, she refused several times to initiate appeal and stated to call ambulance. [facility] called and bedside nurse, ACC, and attending notified.   11:07 Received [employer] appeal case ID [id number] Document Request Fax in CM office. Called patient's daughter and again explained appeal process. Asked again if she wanted to proceed with appeal or have patient go home. She opted to go home. Informed her she needed to call [employer] back and cancel appeal. Cm faxed back Document Request Fax annotated that daughter no longer wants to appeal.   11:10 [facility] arrived to transport home. Spoke w patient's son and verified he is  home and informed him that patient will be returning soon and he will need to let them in. He is agreeable to plan   Virani,Thao Daughter   [phone]  Hoheisel,Hieu Son   [phone]      Final next level of care: Home w Hospice Care Barriers to Discharge: No Barriers Identified   Patient Goals and CMS Choice Patient states their goals for this hospitalization and ongoing recovery are:: patient non responsive   Choice offered to / list presented to : NA  Discharge Placement                       Discharge Plan and Services   Discharge Planning Services: CM Consult            DME Arranged: N/A           [facility] Agency: Hospice and [place] Date [place]: 11/01/21 Time [place]: 1002 Representative spoke with at [place]: [place] Determinants of Health ([place]) Interventions     Readmission Risk Interventions     No data to display

## 2021-11-01 NOTE — Care Management Important Message (Signed)
Important Message  Patient Details  Name: [name] MRN: [id number] Date of Birth: 1945-04-09   Medicare Important Message Given:  Yes     Carles Collet, RN 11/01/2021, 9:33 AM

## 2021-11-12 ENCOUNTER — Ambulatory Visit: Admitting: Internal Medicine

## 2022-04-24 ENCOUNTER — Emergency Department (HOSPITAL_COMMUNITY): Payer: Medicare Other

## 2022-04-24 ENCOUNTER — Inpatient Hospital Stay (HOSPITAL_COMMUNITY)
Admission: EM | Admit: 2022-04-24 | Discharge: 2022-04-27 | DRG: 540 | Disposition: A | Discharge status: Hospice | Payer: Medicare Other | Attending: Internal Medicine | Admitting: Internal Medicine

## 2022-04-24 DIAGNOSIS — E162 Hypoglycemia, unspecified: Secondary | ICD-10-CM | POA: Diagnosis present

## 2022-04-24 DIAGNOSIS — M86172 Other acute osteomyelitis, left ankle and foot: Principal | ICD-10-CM | POA: Diagnosis present

## 2022-04-24 DIAGNOSIS — E871 Hypo-osmolality and hyponatremia: Secondary | ICD-10-CM | POA: Diagnosis present

## 2022-04-24 DIAGNOSIS — F0390 Unspecified dementia without behavioral disturbance: Secondary | ICD-10-CM | POA: Diagnosis present

## 2022-04-24 DIAGNOSIS — R64 Cachexia: Secondary | ICD-10-CM | POA: Diagnosis present

## 2022-04-24 DIAGNOSIS — Z681 Body mass index (BMI) 19 or less, adult: Secondary | ICD-10-CM

## 2022-04-24 DIAGNOSIS — L89899 Pressure ulcer of other site, unspecified stage: Secondary | ICD-10-CM | POA: Diagnosis present

## 2022-04-24 DIAGNOSIS — Z86010 Personal history of colonic polyps: Secondary | ICD-10-CM | POA: Diagnosis not present

## 2022-04-24 DIAGNOSIS — Z515 Encounter for palliative care: Secondary | ICD-10-CM

## 2022-04-24 DIAGNOSIS — Z8673 Personal history of transient ischemic attack (TIA), and cerebral infarction without residual deficits: Secondary | ICD-10-CM | POA: Diagnosis not present

## 2022-04-24 DIAGNOSIS — M62422 Contracture of muscle, left upper arm: Secondary | ICD-10-CM | POA: Diagnosis not present

## 2022-04-24 DIAGNOSIS — Z87891 Personal history of nicotine dependence: Secondary | ICD-10-CM

## 2022-04-24 DIAGNOSIS — L97529 Non-pressure chronic ulcer of other part of left foot with unspecified severity: Secondary | ICD-10-CM | POA: Diagnosis present

## 2022-04-24 DIAGNOSIS — R636 Underweight: Secondary | ICD-10-CM | POA: Diagnosis not present

## 2022-04-24 DIAGNOSIS — Z7189 Other specified counseling: Secondary | ICD-10-CM | POA: Diagnosis not present

## 2022-04-24 DIAGNOSIS — E860 Dehydration: Secondary | ICD-10-CM | POA: Diagnosis present

## 2022-04-24 DIAGNOSIS — R627 Adult failure to thrive: Secondary | ICD-10-CM | POA: Diagnosis present

## 2022-04-24 DIAGNOSIS — Z8546 Personal history of malignant neoplasm of prostate: Secondary | ICD-10-CM

## 2022-04-24 DIAGNOSIS — L03032 Cellulitis of left toe: Secondary | ICD-10-CM | POA: Diagnosis not present

## 2022-04-24 DIAGNOSIS — D638 Anemia in other chronic diseases classified elsewhere: Secondary | ICD-10-CM | POA: Diagnosis present

## 2022-04-24 DIAGNOSIS — D72819 Decreased white blood cell count, unspecified: Secondary | ICD-10-CM | POA: Diagnosis present

## 2022-04-24 DIAGNOSIS — Z923 Personal history of irradiation: Secondary | ICD-10-CM | POA: Diagnosis not present

## 2022-04-24 DIAGNOSIS — R54 Age-related physical debility: Secondary | ICD-10-CM | POA: Diagnosis present

## 2022-04-24 DIAGNOSIS — M62421 Contracture of muscle, right upper arm: Secondary | ICD-10-CM | POA: Diagnosis not present

## 2022-04-24 DIAGNOSIS — L089 Local infection of the skin and subcutaneous tissue, unspecified: Secondary | ICD-10-CM | POA: Diagnosis present

## 2022-04-24 DIAGNOSIS — Z931 Gastrostomy status: Secondary | ICD-10-CM | POA: Diagnosis not present

## 2022-04-24 DIAGNOSIS — Z7401 Bed confinement status: Secondary | ICD-10-CM

## 2022-04-24 DIAGNOSIS — M869 Osteomyelitis, unspecified: Secondary | ICD-10-CM | POA: Diagnosis not present

## 2022-04-24 DIAGNOSIS — M62462 Contracture of muscle, left lower leg: Secondary | ICD-10-CM | POA: Diagnosis not present

## 2022-04-24 DIAGNOSIS — M62461 Contracture of muscle, right lower leg: Secondary | ICD-10-CM | POA: Diagnosis not present

## 2022-04-24 LAB — CBC WITH DIFFERENTIAL/PLATELET
Abs Immature Granulocytes: 0.02 10*3/uL (ref 0.00–0.07)
Basophils Absolute: 0.1 10*3/uL (ref 0.0–0.1)
Basophils Relative: 1 %
Eosinophils Absolute: 0.1 10*3/uL (ref 0.0–0.5)
Eosinophils Relative: 1 %
HCT: 28.8 % — ABNORMAL LOW (ref 39.0–52.0)
Hemoglobin: 9.6 g/dL — ABNORMAL LOW (ref 13.0–17.0)
Immature Granulocytes: 0 %
Lymphocytes Relative: 19 %
Lymphs Abs: 1.1 10*3/uL (ref 0.7–4.0)
MCH: 32.5 pg (ref 26.0–34.0)
MCHC: 33.3 g/dL (ref 30.0–36.0)
MCV: 97.6 fL (ref 80.0–100.0)
Monocytes Absolute: 0.4 10*3/uL (ref 0.1–1.0)
Monocytes Relative: 7 %
Neutro Abs: 4.1 10*3/uL (ref 1.7–7.7)
Neutrophils Relative %: 72 %
Platelets: 411 10*3/uL — ABNORMAL HIGH (ref 150–400)
RBC: 2.95 MIL/uL — ABNORMAL LOW (ref 4.22–5.81)
RDW: 13 % (ref 11.5–15.5)
WBC: 5.8 10*3/uL (ref 4.0–10.5)
nRBC: 0 % (ref 0.0–0.2)

## 2022-04-24 LAB — COMPREHENSIVE METABOLIC PANEL
ALT: 22 U/L (ref 0–44)
AST: 33 U/L (ref 15–41)
Albumin: 3 g/dL — ABNORMAL LOW (ref 3.5–5.0)
Alkaline Phosphatase: 60 U/L (ref 38–126)
Anion gap: 10 (ref 5–15)
BUN: 22 mg/dL (ref 8–23)
CO2: 22 mmol/L (ref 22–32)
Calcium: 8.6 mg/dL — ABNORMAL LOW (ref 8.9–10.3)
Chloride: 96 mmol/L — ABNORMAL LOW (ref 98–111)
Creatinine, Ser: 0.84 mg/dL (ref 0.61–1.24)
GFR, Estimated: >60 mL/min (ref >60)
Glucose, Bld: 92 mg/dL (ref 70–99)
Potassium: 4.5 mmol/L (ref 3.5–5.1)
Sodium: 128 mmol/L — ABNORMAL LOW (ref 135–145)
Total Bilirubin: 0.5 mg/dL (ref 0.3–1.2)
Total Protein: 7.2 g/dL (ref 6.5–8.1)

## 2022-04-24 LAB — MRSA NEXT GEN BY PCR, NASAL: MRSA by PCR Next Gen: NOT DETECTED

## 2022-04-24 LAB — LACTIC ACID, PLASMA
Lactic Acid, Venous: 1 mmol/L (ref 0.5–1.9)
Lactic Acid, Venous: 0.8 mmol/L (ref 0.5–1.9)

## 2022-04-24 MED ORDER — ENOXAPARIN SODIUM 40 MG/0.4ML IJ SOSY
40.0000 mg | PREFILLED_SYRINGE | INTRAMUSCULAR | Status: DC
  Administered 2022-04-24: 40 mg via SUBCUTANEOUS
  Filled 2022-04-24: qty 0.4

## 2022-04-24 MED ORDER — BACLOFEN 10 MG PO TABS: 5.0000 mg | ORAL_TABLET | Freq: Three times a day (TID) | ORAL | Status: DC | PRN

## 2022-04-24 MED ORDER — ALBUTEROL SULFATE (2.5 MG/3ML) 0.083% IN NEBU: 2.5000 mg | INHALATION_SOLUTION | RESPIRATORY_TRACT | Status: DC | PRN

## 2022-04-24 MED ORDER — SODIUM CHLORIDE 0.9 % IV SOLN: INTRAVENOUS | Status: DC

## 2022-04-24 MED ORDER — PIPERACILLIN-TAZOBACTAM 3.375 G IVPB 30 MIN
3.3750 g | Freq: Once | INTRAVENOUS | Status: AC
  Administered 2022-04-24: 3.375 g via INTRAVENOUS
  Filled 2022-04-24: qty 50

## 2022-04-24 MED ORDER — VANCOMYCIN HCL IN DEXTROSE 1-5 GM/200ML-% IV SOLN
1000.0000 mg | INTRAVENOUS | Status: AC
  Administered 2022-04-24: 1000 mg via INTRAVENOUS
  Filled 2022-04-24: qty 200

## 2022-04-24 MED ORDER — VANCOMYCIN HCL 750 MG/150ML IV SOLN: 750.0000 mg | Freq: Two times a day (BID) | INTRAVENOUS | Status: DC

## 2022-04-24 MED ORDER — SODIUM CHLORIDE 0.9 % IV SOLN
2.0000 g | Freq: Two times a day (BID) | INTRAVENOUS | Status: DC
  Administered 2022-04-24 – 2022-04-26 (×4): 2 g via INTRAVENOUS
  Filled 2022-04-24 (×4): qty 12.5

## 2022-04-24 MED ORDER — SODIUM CHLORIDE 0.9 % IV BOLUS
500.0000 mL | Freq: Once | INTRAVENOUS | Status: AC
  Administered 2022-04-24: 500 mL via INTRAVENOUS

## 2022-04-24 MED ORDER — VANCOMYCIN HCL 500 MG/100ML IV SOLN
500.0000 mg | INTRAVENOUS | Status: DC
  Administered 2022-04-25: 500 mg via INTRAVENOUS
  Filled 2022-04-24: qty 100

## 2022-04-24 MED ORDER — SODIUM CHLORIDE 0.9 % IV BOLUS: 1000.0000 mL | Freq: Once | INTRAVENOUS | Status: AC

## 2022-04-24 MED ORDER — FAMOTIDINE 20 MG PO TABS
20.0000 mg | ORAL_TABLET | Freq: Two times a day (BID) | ORAL | Status: DC
  Administered 2022-04-24: 20 mg via ORAL
  Filled 2022-04-24: qty 1

## 2022-04-24 MED ORDER — MIDODRINE HCL 2.5 MG PO TABS
2.5000 mg | ORAL_TABLET | Freq: Three times a day (TID) | ORAL | Status: DC
  Filled 2022-04-24 (×3): qty 1

## 2022-04-24 NOTE — Progress Notes (Signed)
Pharmacy Antibiotic Note  [name] is a 77 y.o. male admitted on 04/24/2022 with left great toe cellulitis, suspected osteomyelitis. Pharmacy has been consulted for Vancomycin dosing.  Plan: Vancomycin 1g IV x 1, then 561m IV q24h  Vancomycin levels at steady state as indicated Cefepime per MD Monitor renal function, cultures, clinical course  Height: 5' 1"$  (154.9 cm) Weight: 40.8 kg (89 lb 15.2 oz) IBW/kg (Calculated) : 52.3  Temp (24hrs), Avg:98.5 F (36.9 C), Min:97.8 F (36.6 C), Max:98.9 F (37.2 C)  Recent Labs  Lab 04/24/22 1211 04/24/22 1235 04/24/22 1540  WBC 5.8  --   --   CREATININE 0.84  --   --   LATICACIDVEN  --  1.0 0.8    Estimated Creatinine Clearance: 43.8 mL/min (by C-G formula based on SCr of 0.84 mg/dL).    No Known Allergies  Antimicrobials this admission: 2/9 Zosyn x 1 2/9 Vancomycin >> 2/9 Cefepime >>  Dose adjustments this admission: --  Microbiology results: 2/9 BCx:   Thank you for allowing pharmacy to be a part of this patient's care.   JLindell Spar PharmD, BCPS Clinical Pharmacist 04/24/2022 5:23 PM

## 2022-04-24 NOTE — H&P (Addendum)
History and Physical    Patient: [name] [id number] DOB: 01/23/1946 DOA: 04/24/2022 DOS: the patient was seen and examined on 04/24/2022 PCP: Pcp, No  Patient coming from: Home (Home hospice pt)  Chief Complaint: L big toe infection   HPI: [name] is a 77 y.o. male coming from home (home hospice) with a L great toe infection. Daughter [name] [phone] provides the history as the pt's son in the room does not speak english. [name] advises that the pt has been dealing with this L great toe infection for the past 2 months. Pt recently received a course of antibx (?keflex) from the PCP but despite being compliant with those antibx's the pt's infection continued to get worse. [name] advised the pt had a fever of 101F (which is what prompted the family to bring the pt to the hospital).  Pt had a workup in the ER which included Xray of the L great toe. Family would like the pt treated in the hospital for this issue. He will be admitted to medicine for further management.  UPDATE: 600 PM, pt seen and examined at the bedside. Transfer to progressive unit per nursing request due to pt's BP. Answer's pt's son questions.  Review of Systems: As mentioned in the history of present illness. All other systems reviewed and are negative. Past Medical History:  Diagnosis Date   Adenomatous polyp of colon 06/2010   4 polyps removed at colonoscopy by Dr Henrene Pastor   Anxiety    Chronic headaches    Depression    Diverticulosis of colon 08/2010   Internal hemorrhoids 08/2010   Prostate cancer [facility])    receiving radiation treatment   Subdural hematoma, chronic ([place])    Tuberculosis    Past Surgical History:  Procedure Laterality Date   BIOPSY  01/29/2020   Procedure: BIOPSY;  Surgeon: Irene Shipper, MD;  Location: [facility] ENDOSCOPY;  Service: Endoscopy;;   COLONOSCOPY WITH PROPOFOL N/A 01/29/2020   Procedure: COLONOSCOPY WITH PROPOFOL;  Surgeon: Irene Shipper, MD;  Location: [place];  Service: Endoscopy;   Laterality: N/A;   ESOPHAGOGASTRODUODENOSCOPY (EGD) WITH PROPOFOL N/A 01/29/2020   Procedure: ESOPHAGOGASTRODUODENOSCOPY (EGD) WITH PROPOFOL;  Surgeon: Irene Shipper, MD;  Location: [facility] ENDOSCOPY;  Service: Endoscopy;  Laterality: N/A;   FLEXIBLE SIGMOIDOSCOPY  03/29/2012   Procedure: FLEXIBLE SIGMOIDOSCOPY;  Surgeon: Inda Castle, MD;  Location: [place];  Service: Endoscopy;  Laterality: N/A;  possibly may do colonoscopy but prep is only for a flex   FLEXIBLE SIGMOIDOSCOPY  03/31/2012   Procedure: FLEXIBLE SIGMOIDOSCOPY;  Surgeon: Inda Castle, MD;  Location: [place];  Service: Endoscopy;  Laterality: N/A;   HEMORRHOID BANDING  03/31/2012   Procedure: HEMORRHOID BANDING;  Surgeon: Inda Castle, MD;  Location: [place];  Service: Endoscopy;  Laterality: N/A;   HOT HEMOSTASIS N/A 01/29/2020   Procedure: HOT HEMOSTASIS (ARGON PLASMA COAGULATION/BICAP);  Surgeon: Irene Shipper, MD;  [facility] ENDOSCOPY;  Service: Endoscopy;  Laterality: N/A;   MULTIPLE EXTRACTIONS WITH ALVEOLOPLASTY N/A 08/08/2013   Procedure: MULTIPLE EXTRACTION OF TEETH #1, 2, 3, 6, 7, 9, 11, 15, 16, 17, 20, 21, 22, 23, 24, 26, 29, 30, 32 WITH ALVEOLOPLASTY AND REMOVAL BUCCAL EXOSTOSIS LEFT MAXILLA;  Surgeon: Gae Bon, DDS;  Location: [place];  Service: Oral Surgery;  Laterality: N/A;   shrapnel removal     skull during [place] War   Social History:  reports that he quit smoking about 30 years ago. His smoking  use included cigarettes. He has a 1.25 pack-year smoking history. He has never used smokeless tobacco. He reports that he does not drink alcohol and does not use drugs.  No Known Allergies  No family history on file.  Prior to Admission medications   Medication Sig Start Date End Date Taking? Authorizing Provider  albuterol (VENTOLIN HFA) 108 (90 Base) MCG/ACT inhaler Inhale 2 puffs into the lungs every 4 (four) hours as needed for wheezing or shortness of breath. 11/01/21   Ghimire, Henreitta Leber, MD   baclofen (LIORESAL) 10 MG tablet Place 0.5 tablets (5 mg total) into feeding tube 3 (three) times daily. 11/01/21   Ghimire, Henreitta Leber, MD  famotidine (PEPCID) 20 MG tablet Place 1 tablet (20 mg total) into feeding tube 2 (two) times daily. 11/01/21   Ghimire, Henreitta Leber, MD  midodrine (PROAMATINE) 2.5 MG tablet Place 1 tablet (2.5 mg total) into feeding tube 3 (three) times daily with meals. 11/01/21   Jonetta Osgood, MD    Physical Exam: Vitals:   04/24/22 1200 04/24/22 1300 04/24/22 1400  BP: (!) 106/53 (!) 94/53 110/64  Pulse: 88 83 82  Resp: 17 16 17  $ Temp: 98.9 F (37.2 C)    TempSrc: Oral    SpO2: 100% 97% 97%   Physical Exam Constitutional:      Comments: Contracted  HENT:     Head: Normocephalic.     Mouth/Throat:     Mouth: Mucous membranes are moist.  Cardiovascular:     Rate and Rhythm: Normal rate and regular rhythm.  Pulmonary:     Effort: Pulmonary effort is normal.     Breath sounds: Normal breath sounds.  Abdominal:     General: Abdomen is flat.     Palpations: Abdomen is soft.  Musculoskeletal:     Cervical back: Neck supple.  Skin:    General: Skin is warm.     Comments: L great toe ulcer on the medial aspect of the foot   Neurological:     Mental Status: He is alert.     Comments: Unable to ascertain   Psychiatric:     Comments: Unable to ascertain     Data Reviewed:  There are no new results to review at this time.  Assessment and Plan: L great toe cellulitis (suspected osteomyelitis)  - Orthopedic service consulted 04/24/2022 - IV NS 75 cc/hr  - IV cefepime 2 g q12  - IV vancomycin 750 mg q12 with pharmacy consult  - L foot MRI ordered   2. Acute hypoNa+  - IV fluids as above   DVT prophylaxis: Lovenox 40 mg sq daily    Advance Care Planning: CODE STATUS FULL ([name] advised to "save the pt until the end")  Consults: Orthopedic service   Family Communication: [name] as noted above   Severity of Illness: The appropriate patient  status for this patient is INPATIENT. Inpatient status is judged to be reasonable and necessary in order to provide the required intensity of service to ensure the patient's safety. The patient's presenting symptoms, physical exam findings, and initial radiographic and laboratory data in the context of their chronic comorbidities is felt to place them at high risk for further clinical deterioration. Furthermore, it is not anticipated that the patient will be medically stable for discharge from the hospital within 2 midnights of admission.   * I certify that at the point of admission it is my clinical judgment that the patient will require inpatient hospital care spanning beyond  2 midnights from the point of admission due to high intensity of service, high risk for further deterioration and high frequency of surveillance required.*  Author: Lucienne Minks , MD 04/24/2022 3:19 PM  For on call review [email or web address].

## 2022-04-24 NOTE — ED Provider Notes (Signed)
[facility] Provider Note   CSN: [id number] Arrival date & time: 04/24/22  1139     History  No chief complaint on file.   [name] is a 77 y.o. male.  77 year old male with prior medical history as detailed below presents from home for evaluation of chronic wound of the left foot.  Patient is unable to provide history given advanced dementia.  [name] reports through phone contact that the patient's left foot has chronic ulceration to the sole at the base of the great toe.  This wound has been worsening over the last 2 to 3 weeks.  Patient was placed on a course of Keflex for treatment of suspected wound infection.  This did not cause significant improvement.  Patient was evaluated by his home hospice nurse this morning and noted to have a decreased blood pressure with increased drainage from the wound on the left foot.  Patient was advised to come to the ED for likely admission and IV antibiotics.  Patient is followed by home hospice.  Patient is full code.   The history is provided by the patient and medical records.       Home Medications Prior to Admission medications   Medication Sig Start Date End Date Taking? Authorizing Provider  albuterol (VENTOLIN HFA) 108 (90 Base) MCG/ACT inhaler Inhale 2 puffs into the lungs every 4 (four) hours as needed for wheezing or shortness of breath. 11/01/21   Ghimire, Henreitta Leber, MD  baclofen (LIORESAL) 10 MG tablet Place 0.5 tablets (5 mg total) into feeding tube 3 (three) times daily. 11/01/21   Ghimire, Henreitta Leber, MD  famotidine (PEPCID) 20 MG tablet Place 1 tablet (20 mg total) into feeding tube 2 (two) times daily. 11/01/21   Ghimire, Henreitta Leber, MD  midodrine (PROAMATINE) 2.5 MG tablet Place 1 tablet (2.5 mg total) into feeding tube 3 (three) times daily with meals. 11/01/21   Ghimire, Henreitta Leber, MD      Allergies    Patient has no known allergies.    Review of Systems   Review of  Systems  All other systems reviewed and are negative.   Physical Exam Updated Vital Signs BP (!) 106/53 (BP Location: Left Arm)   Pulse 88   Temp 98.9 F (37.2 C) (Oral)   Resp 17   SpO2 100%  Physical Exam Vitals and nursing note reviewed.  Constitutional:      General: He is not in acute distress.    Appearance: He is well-developed.     Comments: Alert but demented, bedbound, contracted, G-tube and Foley catheter in place  HENT:     Head: Normocephalic and atraumatic.  Eyes:     Conjunctiva/sclera: Conjunctivae normal.     Pupils: Pupils are equal, round, and reactive to light.  Cardiovascular:     Rate and Rhythm: Normal rate and regular rhythm.     Heart sounds: Normal heart sounds.  Pulmonary:     Effort: Pulmonary effort is normal. No respiratory distress.     Breath sounds: Normal breath sounds.  Abdominal:     General: There is no distension.     Palpations: Abdomen is soft.     Tenderness: There is no abdominal tenderness.  Musculoskeletal:        General: No deformity. Normal range of motion.     Cervical back: Normal range of motion and neck supple.  Skin:    General: Skin is warm and dry.  Comments: Ulceration with drainage noted from the wound on the sole of the left foot at the base of the left great toe.  See images below.  Neurological:     General: No focal deficit present.     Mental Status: He is alert and oriented to person, place, and time.        ED Results / Procedures / Treatments   Labs (all labs ordered are listed, but only abnormal results are displayed) Labs Reviewed  CBC WITH DIFFERENTIAL/PLATELET - Abnormal; Notable for the following components:      Result Value   RBC 2.95 (*)    Hemoglobin 9.6 (*)    HCT 28.8 (*)    Platelets 411 (*)    All other components within normal limits  COMPREHENSIVE METABOLIC PANEL - Abnormal; Notable for the following components:   Sodium 128 (*)    Chloride 96 (*)    Calcium 8.6 (*)     Albumin 3.0 (*)    All other components within normal limits  CULTURE, BLOOD (ROUTINE X 2)  CULTURE, BLOOD (ROUTINE X 2)  LACTIC ACID, PLASMA  LACTIC ACID, PLASMA    EKG None  Radiology DG Foot Complete Left  Result Date: 04/24/2022 CLINICAL DATA:  Ulceration of the left foot.  Infection. EXAM: LEFT FOOT - COMPLETE 3+ VIEW COMPARISON:  Left tibia and fibula radiographs 01/26/2020, left foot radiographs 05/07/2014 FINDINGS: There is dislocation of the great toe metatarsophalangeal joint with the phalanges directed dorsally, posteriorly, and laterally with respect to the distal aspect of the metatarsal. There appears be moderate erosion of the distal aspect of the first metatarsal. There are subcutaneous lucencies suggesting air and possible soft tissue infection in the region of the great toe metatarsophalangeal joint. Mild dorsal talonavicular degenerative spurring. No acute fracture. IMPRESSION: 1. Dislocation of the great toe metatarsophalangeal joint. The phalanges are directed dorsally and laterally, predominantly perpendicular to the expected direction of the metatarsophalangeal joint. 2. Subcutaneous lucencies suggesting air and possible soft tissue infection in the region of the great toe metatarsophalangeal joint. Moderate erosion of the distal aspect of the first metatarsal, suspicious for osteomyelitis. Electronically Signed   By: Yvonne Kendall M.D.   On: 04/24/2022 12:29    Procedures Procedures    Medications Ordered in ED Medications  piperacillin-tazobactam (ZOSYN) IVPB 3.375 g (3.375 g Intravenous New Bag/Given 04/24/22 1247)    ED Course/ Medical Decision Making/ A&P                             Medical Decision Making Amount and/or Complexity of Data Reviewed Labs: ordered. Radiology: ordered.  Risk Prescription drug management. Decision regarding hospitalization.    Medical Screen Complete  This patient presented to the ED with complaint of infection of left  foot.  This complaint involves an extensive number of treatment options. The initial differential diagnosis includes, but is not limited to, chronic ulceration with acute infection, osteomyelitis, metabolic abnormality, etc.  This presentation is: Acute, Chronic, Self-Limited, Previously Undiagnosed, Uncertain Prognosis, Complicated, Systemic Symptoms, and Threat to Life/Bodily Function  Patient is presenting for evaluation of reported worsening of chronic infection of the left foot.  Patient apparently has trialed Keflex as an outpatient without improvement in the wound.  Family reports increased drainage.  Hospice nurse apparently documented low blood pressures earlier today and referred the patient to the ED for evaluation.  Patient's presentation is not consistent with possible osteomyelitis.  Antibiotics initiated  in ED.  Patient would benefit from admission.  Hospital service made aware of case and will evaluate for same.  Additional history obtained:  Additional history obtained from [facility] External records from outside sources obtained and reviewed including prior ED visits and prior Inpatient records.    Lab Tests:  I ordered and personally interpreted labs.  The pertinent results include: CBC, CMP, lactic acid   Imaging Studies ordered:  I ordered imaging studies including plain films of left foot I independently visualized and interpreted obtained imaging which showed concerning for possible osteomyelitis I agree with the radiologist interpretation.   Cardiac Monitoring:  The patient was maintained on a cardiac monitor.  I personally viewed and interpreted the cardiac monitor which showed an underlying rhythm of: NSR   Medicines ordered:  I ordered medication including Zosyn for left foot and for Reevaluation of the patient after these medicines showed that the patient: improved   Problem List / ED Course:  Left foot ulceration with suspected acute  infection   Reevaluation:  After the interventions noted above, I reevaluated the patient and found that they have: improved  Disposition:  After consideration of the diagnostic results and the patients response to treatment, I feel that the patent would benefit from close outpatient follow-up.          Final Clinical Impression(s) / ED Diagnoses Final diagnoses:  Left foot infection    Rx / DC Orders ED Discharge Orders     None         Valarie Merino, MD 04/24/22 1558

## 2022-04-24 NOTE — ED Triage Notes (Signed)
Pt arrives EMS from home for infection in left foot. Pt is currently on Keflex for this infection.

## 2022-04-24 NOTE — ED Notes (Signed)
ED TO INPATIENT HANDOFF REPORT  ED Nurse Name and Phone #: Migdalia Dk Name/Age/Gender [name] 77 y.o. male Room/Bed: WA03/WA03  Code Status   Code Status: Full Code  Home/SNF/Other Home Patient oriented to: none Is this baseline? Yes   Triage Complete: Triage complete  Chief Complaint Cellulitis of left toe [id number]  Triage Note Pt arrives EMS from home for infection in left foot. Pt is currently on Keflex for this infection.   Allergies No Known Allergies  Level of Care/Admitting Diagnosis ED Disposition     ED Disposition  Admit   Condition  --   Comment  Hospital Area: [place] [[id number]]  Level of Care: Med-Surg [16]  May admit patient to [facility] or [facility] if equivalent level of care is available:: Yes  Covid Evaluation: Asymptomatic - no recent exposure (last 10 days) testing not required  Diagnosis: Cellulitis of left toe [id number]  Admitting Physician: Lucienne Minks [id number]  Attending Physician: Lucienne Minks [id number]  Certification:: I certify this patient will need inpatient services for at least 2 midnights  Estimated Length of Stay: 3          B Medical/Surgery History Past Medical History:  Diagnosis Date   Adenomatous polyp of colon 06/2010   4 polyps removed at colonoscopy by Dr Henrene Pastor   Anxiety    Chronic headaches    Depression    Diverticulosis of colon 08/2010   Internal hemorrhoids 08/2010   Prostate cancer [facility])    receiving radiation treatment   Subdural hematoma, chronic ([place])    Tuberculosis    Past Surgical History:  Procedure Laterality Date   BIOPSY  01/29/2020   Procedure: BIOPSY;  Surgeon: Irene Shipper, MD;  Location: [facility] ENDOSCOPY;  Service: Endoscopy;;   COLONOSCOPY WITH PROPOFOL N/A 01/29/2020   Procedure: COLONOSCOPY WITH PROPOFOL;  Surgeon: Irene Shipper, MD;  Location: [place];  Service: Endoscopy;  Laterality: N/A;   ESOPHAGOGASTRODUODENOSCOPY (EGD) WITH PROPOFOL N/A  01/29/2020   Procedure: ESOPHAGOGASTRODUODENOSCOPY (EGD) WITH PROPOFOL;  Surgeon: Irene Shipper, MD;  Location: [facility] ENDOSCOPY;  Service: Endoscopy;  Laterality: N/A;   FLEXIBLE SIGMOIDOSCOPY  03/29/2012   Procedure: FLEXIBLE SIGMOIDOSCOPY;  Surgeon: Inda Castle, MD;  Location: [place];  Service: Endoscopy;  Laterality: N/A;  possibly may do colonoscopy but prep is only for a flex   FLEXIBLE SIGMOIDOSCOPY  03/31/2012   Procedure: FLEXIBLE SIGMOIDOSCOPY;  Surgeon: Inda Castle, MD;  Location: [place];  Service: Endoscopy;  Laterality: N/A;   HEMORRHOID BANDING  03/31/2012   Procedure: HEMORRHOID BANDING;  Surgeon: Inda Castle, MD;  Location: [place];  Service: Endoscopy;  Laterality: N/A;   HOT HEMOSTASIS N/A 01/29/2020   Procedure: HOT HEMOSTASIS (ARGON PLASMA COAGULATION/BICAP);  Surgeon: Irene Shipper, MD;  Location: [facility] ENDOSCOPY;  Service: Endoscopy;  Laterality: N/A;   MULTIPLE EXTRACTIONS WITH ALVEOLOPLASTY N/A 08/08/2013   Procedure: MULTIPLE EXTRACTION OF TEETH #1, 2, 3, 6, 7, 9, 11, 15, 16, 17, 20, 21, 22, 23, 24, 26, 29, 30, 32 WITH ALVEOLOPLASTY AND REMOVAL BUCCAL EXOSTOSIS LEFT MAXILLA;  Surgeon: Gae Bon, DDS;  Location: [place];  Service: Oral Surgery;  Laterality: N/A;   shrapnel removal     skull during [place]     A IV Location/Drains/Wounds Patient Lines/Drains/Airways Status     Active Line/Drains/Airways     Name Placement date Placement time Site Days   Peripheral IV 04/24/22 22 G Left;Posterior Hand 04/24/22  1242  Hand  less than 1   Urethral Catheter MD [place] Straight-tip 16 Fr. 10/27/21  1545  Straight-tip  179   Wound / Incision (Open or Dehisced) 07/23/21 Other (Comment) Buttocks Left 2 Blisters ( 1 open, 1 intact) 65m each 07/23/21  1330  Buttocks  275            Intake/Output Last 24 hours  Intake/Output Summary (Last 24 hours) at 04/24/2022 1544 Last data filed at 04/24/2022 1317 Gross per 24 hour  Intake 50 ml  Output --   Net 50 ml    Labs/Imaging Results for orders placed or performed during the hospital encounter of 04/24/22 (from the past 48 hour(s))  CBC with Differential     Status: Abnormal   Collection Time: 04/24/22 12:11 PM  Result Value Ref Range   WBC 5.8 4.0 - 10.5 K/uL   RBC 2.95 (L) 4.22 - 5.81 MIL/uL   Hemoglobin 9.6 (L) 13.0 - 17.0 g/dL   HCT 28.8 (L) 39.0 - 52.0 %   MCV 97.6 80.0 - 100.0 fL   MCH 32.5 26.0 - 34.0 pg   MCHC 33.3 30.0 - 36.0 g/dL   RDW 13.0 11.5 - 15.5 %   Platelets 411 (H) 150 - 400 K/uL   nRBC 0.0 0.0 - 0.2 %   Neutrophils Relative % 72 %   Neutro Abs 4.1 1.7 - 7.7 K/uL   Lymphocytes Relative 19 %   Lymphs Abs 1.1 0.7 - 4.0 K/uL   Monocytes Relative 7 %   Monocytes Absolute 0.4 0.1 - 1.0 K/uL   Eosinophils Relative 1 %   Eosinophils Absolute 0.1 0.0 - 0.5 K/uL   Basophils Relative 1 %   Basophils Absolute 0.1 0.0 - 0.1 K/uL   Immature Granulocytes 0 %   Abs Immature Granulocytes 0.02 0.00 - 0.07 K/uL    Comment: Performed at [facility] [address], [place] [address] Comprehensive metabolic panel     Status: Abnormal   Collection Time: 04/24/22 12:11 PM  Result Value Ref Range   Sodium 128 (L) 135 - 145 mmol/L   Potassium 4.5 3.5 - 5.1 mmol/L   Chloride 96 (L) 98 - 111 mmol/L   CO2 22 22 - 32 mmol/L   Glucose, Bld 92 70 - 99 mg/dL    Comment: Glucose reference range applies only to samples taken after fasting for at least 8 hours.   BUN 22 8 - 23 mg/dL   Creatinine, Ser 0.84 0.61 - 1.24 mg/dL   Calcium 8.6 (L) 8.9 - 10.3 mg/dL   Total Protein 7.2 6.5 - 8.1 g/dL   Albumin 3.0 (L) 3.5 - 5.0 g/dL   AST 33 15 - 41 U/L   ALT 22 0 - 44 U/L   Alkaline Phosphatase 60 38 - 126 U/L   Total Bilirubin 0.5 0.3 - 1.2 mg/dL   GFR, Estimated >60 >60 mL/min    Comment: (NOTE) Calculated using the CKD-EPI Creatinine Equation (2021)    Anion gap 10 5 - 15    Comment: Performed at [facility] [address] [place] Lactic acid, plasma     Status: None   Collection Time: 04/24/22 12:35 PM  Result Value Ref Range   Lactic Acid, Venous 1.0 0.5 - 1.9 mmol/L    Comment: Performed at [facility] [address], [place]  [id number]  DG Foot Complete Left  Result Date: 04/24/2022 CLINICAL  DATA:  Ulceration of the left foot.  Infection. EXAM: LEFT FOOT - COMPLETE 3+ VIEW COMPARISON:  Left tibia and fibula radiographs 01/26/2020, left foot radiographs 05/07/2014 FINDINGS: There is dislocation of the great toe metatarsophalangeal joint with the phalanges directed dorsally, posteriorly, and laterally with respect to the distal aspect of the metatarsal. There appears be moderate erosion of the distal aspect of the first metatarsal. There are subcutaneous lucencies suggesting air and possible soft tissue infection in the region of the great toe metatarsophalangeal joint. Mild dorsal talonavicular degenerative spurring. No acute fracture. IMPRESSION: 1. Dislocation of the great toe metatarsophalangeal joint. The phalanges are directed dorsally and laterally, predominantly perpendicular to the expected direction of the metatarsophalangeal joint. 2. Subcutaneous lucencies suggesting air and possible soft tissue infection in the region of the great toe metatarsophalangeal joint. Moderate erosion of the distal aspect of the first metatarsal, suspicious for osteomyelitis. Electronically Signed   By: Yvonne Kendall M.D.   On: 04/24/2022 12:29    Pending Labs Unresulted Labs (From admission, onward)     Start     Ordered   04/25/22 0500  CBC  Tomorrow morning,   R        04/24/22 1517   04/25/22 0500  Comprehensive metabolic panel  Tomorrow morning,   R        04/24/22 1517   04/25/22 0500  Magnesium  Tomorrow morning,   R        04/24/22 1517   04/25/22 0500  Phosphorus  Tomorrow morning,   R        04/24/22 1517   04/25/22 0500  C-reactive protein  Tomorrow morning,   R         04/24/22 1517   04/25/22 0500  Sedimentation rate  Tomorrow morning,   R        04/24/22 1517   04/25/22 0500  Hemoglobin A1c  Tomorrow morning,   R        04/24/22 1517   04/24/22 1211  Culture, blood (routine x 2)  BLOOD CULTURE X 2,   R      04/24/22 1210   04/24/22 1211  Lactic acid, plasma  Now then every 2 hours,   R      04/24/22 1210            Vitals/Pain Today's Vitals   04/24/22 1200 04/24/22 1300 04/24/22 1400  BP: (!) 106/53 (!) 94/53 110/64  Pulse: 88 83 82  Resp: 17 16 17  $ Temp: 98.9 F (37.2 C)    TempSrc: Oral    SpO2: 100% 97% 97%    Isolation Precautions No active isolations  Medications Medications  0.9 %  sodium chloride infusion (has no administration in time range)  ceFEPIme (MAXIPIME) 2 g in sodium chloride 0.9 % 100 mL IVPB (has no administration in time range)  enoxaparin (LOVENOX) injection 40 mg (has no administration in time range)  vancomycin (VANCOCIN) IVPB 1000 mg/200 mL premix (has no administration in time range)  albuterol (PROVENTIL) (2.5 MG/3ML) 0.083% nebulizer solution 2.5 mg (has no administration in time range)  baclofen (LIORESAL) tablet 5 mg (has no administration in time range)  famotidine (PEPCID) tablet 20 mg (has no administration in time range)  midodrine (PROAMATINE) tablet 2.5 mg (has no administration in time range)  piperacillin-tazobactam (ZOSYN) IVPB 3.375 g (0 g Intravenous Stopped 04/24/22 1317)  sodium chloride 0.9 % bolus 500 mL (500 mLs Intravenous New Bag/Given 04/24/22 1410)    Mobility non-ambulatory  Focused Assessments    R Recommendations: See Admitting Provider Note  Report given to:   Additional Notes:

## 2022-04-24 NOTE — ED Notes (Signed)
Only one set of blood cultures is able to be collected at this time

## 2022-04-24 NOTE — Progress Notes (Signed)
Manufacturing engineer [facility]) Hospital Liaison Note   This is a current [facility] hospice patient. Please call with any hospice questions or concerns. Thank you.   Zigmund Gottron, RN [facility] Liaison [phone]

## 2022-04-24 NOTE — Consult Note (Signed)
Reason for Consult:Left foot infection Referring Physician:   [name] is an 77 y.o. male.  HPI: Patient presented to the emergency department today for left great toe infection.  His son is in the room but does not speak [place] well. His history is gathered from his daughter via phone. She says he developed a wound at plantar aspect of the left hallux about the MTP joint. She says they tried a course of antibiotics orally through PCP without improvement. Per her report he developed fever and there was concern for worsening infection. Radiographs in the emergency department revealed dorsal dislocation of the hallux MTP joint and evidence of erosion of the first metatarsal distally worrisome for osteomyelitis. He is admitted to the hospitalist service for IV antibiotics and MRI scan of the left foot has been ordered.  Orthopedics was asked to consult.   Family is unsure of any injuries and reports he is not a diabetic.  He does have a G-tube and Foley in place. He has contracted upper and lower extremities.  They say he does not walk much. He is a home hospice patient.  He does not respond to questions and appears nonverbal throughout our encounter today.  Past Medical History:  Diagnosis Date   Adenomatous polyp of colon 06/2010   4 polyps removed at colonoscopy by Dr Henrene Pastor   Anxiety    Chronic headaches    Depression    Diverticulosis of colon 08/2010   Internal hemorrhoids 08/2010   Prostate cancer [facility])    receiving radiation treatment   Subdural hematoma, chronic ([place])    Tuberculosis     Past Surgical History:  Procedure Laterality Date   BIOPSY  01/29/2020   Procedure: BIOPSY;  Surgeon: Irene Shipper, MD;  Location: [facility] ENDOSCOPY;  Service: Endoscopy;;   COLONOSCOPY WITH PROPOFOL N/A 01/29/2020   Procedure: COLONOSCOPY WITH PROPOFOL;  Surgeon: Irene Shipper, MD;  Location: [place];  Service: Endoscopy;  Laterality: N/A;   ESOPHAGOGASTRODUODENOSCOPY (EGD) WITH PROPOFOL N/A  01/29/2020   Procedure: ESOPHAGOGASTRODUODENOSCOPY (EGD) WITH PROPOFOL;  Surgeon: Irene Shipper, MD;  Location: [facility] ENDOSCOPY;  Service: Endoscopy;  Laterality: N/A;   FLEXIBLE SIGMOIDOSCOPY  03/29/2012   Procedure: FLEXIBLE SIGMOIDOSCOPY;  Surgeon: Inda Castle, MD;  Location: [place];  Service: Endoscopy;  Laterality: N/A;  possibly may do colonoscopy but prep is only for a flex   FLEXIBLE SIGMOIDOSCOPY  03/31/2012   Procedure: FLEXIBLE SIGMOIDOSCOPY;  Surgeon: Inda Castle, MD;  Location: [place];  Service: Endoscopy;  Laterality: N/A;   HEMORRHOID BANDING  03/31/2012   Procedure: HEMORRHOID BANDING;  Surgeon: Inda Castle, MD;  Location: [place];  Service: Endoscopy;  Laterality: N/A;   HOT HEMOSTASIS N/A 01/29/2020   Procedure: HOT HEMOSTASIS (ARGON PLASMA COAGULATION/BICAP);  Surgeon: Irene Shipper, MD;  Location: [facility] ENDOSCOPY;  Service: Endoscopy;  Laterality: N/A;   MULTIPLE EXTRACTIONS WITH ALVEOLOPLASTY N/A 08/08/2013   Procedure: MULTIPLE EXTRACTION OF TEETH #1, 2, 3, 6, 7, 9, 11, 15, 16, 17, 20, 21, 22, 23, 24, 26, 29, 30, 32 WITH ALVEOLOPLASTY AND REMOVAL BUCCAL EXOSTOSIS LEFT MAXILLA;  Surgeon: Gae Bon, DDS;  Location: [place];  Service: Oral Surgery;  Laterality: N/A;   shrapnel removal     skull during [place] War    No family history on file.  Social History:  reports that he quit smoking about 30 years ago. His smoking use included cigarettes. He has a 1.25 pack-year smoking history. He has never used  smokeless tobacco. He reports that he does not drink alcohol and does not use drugs.  Allergies: No Known Allergies  Medications: I have reviewed the patient's current medications.  Results for orders placed or performed during the hospital encounter of 04/24/22 (from the past 48 hour(s))  CBC with Differential     Status: Abnormal   Collection Time: 04/24/22 12:11 PM  Result Value Ref Range   WBC 5.8 4.0 - 10.5 K/uL   RBC 2.95 (L) 4.22 - 5.81  MIL/uL   Hemoglobin 9.6 (L) 13.0 - 17.0 g/dL   HCT 28.8 (L) 39.0 - 52.0 %   MCV 97.6 80.0 - 100.0 fL   MCH 32.5 26.0 - 34.0 pg   MCHC 33.3 30.0 - 36.0 g/dL   RDW 13.0 11.5 - 15.5 %   Platelets 411 (H) 150 - 400 K/uL   nRBC 0.0 0.0 - 0.2 %   Neutrophils Relative % 72 %   Neutro Abs 4.1 1.7 - 7.7 K/uL   Lymphocytes Relative 19 %   Lymphs Abs 1.1 0.7 - 4.0 K/uL   Monocytes Relative 7 %   Monocytes Absolute 0.4 0.1 - 1.0 K/uL   Eosinophils Relative 1 %   Eosinophils Absolute 0.1 0.0 - 0.5 K/uL   Basophils Relative 1 %   Basophils Absolute 0.1 0.0 - 0.1 K/uL   Immature Granulocytes 0 %   Abs Immature Granulocytes 0.02 0.00 - 0.07 K/uL    Comment: Performed at [facility], [place] [address] [place], [place] [address]  Comprehensive metabolic panel     Status: Abnormal   Collection Time: 04/24/22 12:11 PM  Result Value Ref Range   Sodium 128 (L) 135 - 145 mmol/L   Potassium 4.5 3.5 - 5.1 mmol/L   Chloride 96 (L) 98 - 111 mmol/L   CO2 22 22 - 32 mmol/L   Glucose, Bld 92 70 - 99 mg/dL    Comment: Glucose reference range applies only to samples taken after fasting for at least 8 hours.   BUN 22 8 - 23 mg/dL   Creatinine, Ser 0.84 0.61 - 1.24 mg/dL   Calcium 8.6 (L) 8.9 - 10.3 mg/dL   Total Protein 7.2 6.5 - 8.1 g/dL   Albumin 3.0 (L) 3.5 - 5.0 g/dL   AST 33 15 - 41 U/L   ALT 22 0 - 44 U/L   Alkaline Phosphatase 60 38 - 126 U/L   Total Bilirubin 0.5 0.3 - 1.2 mg/dL   GFR, Estimated >60 >60 mL/min    Comment: (NOTE) Calculated using the CKD-EPI Creatinine Equation (2021)    Anion gap 10 5 - 15    Comment: Performed at [facility], [place] [address], [place], [place] [address]  Lactic acid, plasma     Status: None   Collection Time: 04/24/22 12:35 PM  Result Value Ref Range   Lactic Acid, Venous 1.0 0.5 - 1.9 mmol/L    Comment: Performed at [facility], [place] [address] [place], [place] [address]  Lactic acid, plasma      Status: None   Collection Time: 04/24/22  3:40 PM  Result Value Ref Range   Lactic Acid, Venous 0.8 0.5 - 1.9 mmol/L    Comment: Performed at [facility], [place] [address], [place], [place] [address]    DG Foot Complete Left  Result Date: 04/24/2022 CLINICAL DATA:  Ulceration of the left foot.  Infection. EXAM: LEFT FOOT - COMPLETE 3+ VIEW COMPARISON:  Left tibia and fibula radiographs 01/26/2020, left foot radiographs 05/07/2014 FINDINGS: There is dislocation of the great toe metatarsophalangeal joint with the phalanges directed dorsally, posteriorly, and laterally with respect to the distal aspect of the metatarsal. There appears be moderate erosion of the distal aspect of the first metatarsal. There are subcutaneous lucencies suggesting air and possible soft tissue infection in the region of the great toe metatarsophalangeal joint. Mild dorsal talonavicular degenerative spurring. No acute fracture. IMPRESSION: 1. Dislocation of the great toe metatarsophalangeal joint. The phalanges are directed dorsally and laterally, predominantly perpendicular to the expected direction of the metatarsophalangeal joint. 2. Subcutaneous lucencies suggesting air and possible soft tissue infection in the region of the great toe metatarsophalangeal joint. Moderate erosion of the distal aspect of the first metatarsal, suspicious for osteomyelitis. Electronically Signed   By: Yvonne Kendall M.D.   On: 04/24/2022 12:29    Review of Systems  Unable to perform ROS: Patient nonverbal   Blood pressure 100/67, pulse 91, temperature 98.8 F (37.1 C), temperature source Oral, resp. rate 17, height 5' 1"$  (1.549 m), weight 50 kg, SpO2 96 %. Physical Exam Constitutional:      Comments: nonverbal  HENT:     Head: Normocephalic and atraumatic.     Nose: Nose normal.  Eyes:     Comments: Eyes are closed throughout much of the encounter  Pulmonary:     Effort: No respiratory distress.  Musculoskeletal:      Comments: Patient is lying in the hospital bed with contracted upper and lower extremities.  He guards when attempting to straighten them.  He does not move much. In his eyes remain closed throughout much of the encounter.  The left foot shows evidence of dorsal dislocation of the hallux MTP joint.  The hallux is held in nearly 90 of extension. It is very stiff and attempts of correction cause apparent pain and withdrawal in patient. There is an ulceration about the dorsal aspect of the hallux MTP joint. There is mild bloody drainage.  There is exposed subcutaneous fat and tissue, but no obvious tendon or bone. Do not appreciate any significant erythema. He does appear to wince and withdraw to palpation and manipulation of the hallux MTP joint. I do have difficulty palpating a reliable pedal pulse. Warm and well perfused distally.  Unable to assess sensation due to patient condition.  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 MRI scan of the left foot has been ordered.  He is pending further medical workup as well. In this setting, I would hope for improvement with IV antibiotics and hope to avoid surgical management in lieu of palliative measures. Did discuss this with the patient's daughter and son at bedside who are in charge of his medical decision making.  They do wish to proceed with surgery if indicated.  We will follow for his response to IV antibiotics and await the findings of his MRI scan.   Lequisha Cammack J [place] 04/24/2022, 5:05 PM

## 2022-04-25 ENCOUNTER — Inpatient Hospital Stay (HOSPITAL_COMMUNITY): Payer: Medicare Other

## 2022-04-25 ENCOUNTER — Other Ambulatory Visit: Payer: Self-pay

## 2022-04-25 DIAGNOSIS — M62421 Contracture of muscle, right upper arm: Secondary | ICD-10-CM | POA: Diagnosis not present

## 2022-04-25 DIAGNOSIS — Z7189 Other specified counseling: Secondary | ICD-10-CM

## 2022-04-25 DIAGNOSIS — M62422 Contracture of muscle, left upper arm: Secondary | ICD-10-CM

## 2022-04-25 DIAGNOSIS — Z515 Encounter for palliative care: Secondary | ICD-10-CM | POA: Diagnosis not present

## 2022-04-25 DIAGNOSIS — M869 Osteomyelitis, unspecified: Secondary | ICD-10-CM | POA: Diagnosis not present

## 2022-04-25 DIAGNOSIS — L03032 Cellulitis of left toe: Secondary | ICD-10-CM | POA: Diagnosis not present

## 2022-04-25 DIAGNOSIS — M62461 Contracture of muscle, right lower leg: Secondary | ICD-10-CM

## 2022-04-25 DIAGNOSIS — L089 Local infection of the skin and subcutaneous tissue, unspecified: Secondary | ICD-10-CM | POA: Diagnosis not present

## 2022-04-25 DIAGNOSIS — M62462 Contracture of muscle, left lower leg: Secondary | ICD-10-CM

## 2022-04-25 DIAGNOSIS — M86172 Other acute osteomyelitis, left ankle and foot: Secondary | ICD-10-CM | POA: Diagnosis not present

## 2022-04-25 LAB — HEMOGLOBIN A1C
Hgb A1c MFr Bld: 5 % (ref 4.8–5.6)
Mean Plasma Glucose: 96.8 mg/dL

## 2022-04-25 LAB — COMPREHENSIVE METABOLIC PANEL
ALT: 17 U/L (ref 0–44)
AST: 22 U/L (ref 15–41)
Albumin: 2.5 g/dL — ABNORMAL LOW (ref 3.5–5.0)
Alkaline Phosphatase: 51 U/L (ref 38–126)
Anion gap: 8 (ref 5–15)
BUN: 17 mg/dL (ref 8–23)
CO2: 19 mmol/L — ABNORMAL LOW (ref 22–32)
Calcium: 8.1 mg/dL — ABNORMAL LOW (ref 8.9–10.3)
Chloride: 105 mmol/L (ref 98–111)
Creatinine, Ser: 0.64 mg/dL (ref 0.61–1.24)
GFR, Estimated: >60 mL/min (ref >60)
Glucose, Bld: 81 mg/dL (ref 70–99)
Potassium: 3.9 mmol/L (ref 3.5–5.1)
Sodium: 132 mmol/L — ABNORMAL LOW (ref 135–145)
Total Bilirubin: 0.8 mg/dL (ref 0.3–1.2)
Total Protein: 5.9 g/dL — ABNORMAL LOW (ref 6.5–8.1)

## 2022-04-25 LAB — CBC
HCT: 26.2 % — ABNORMAL LOW (ref 39.0–52.0)
Hemoglobin: 8.6 g/dL — ABNORMAL LOW (ref 13.0–17.0)
MCH: 32.6 pg (ref 26.0–34.0)
MCHC: 32.8 g/dL (ref 30.0–36.0)
MCV: 99.2 fL (ref 80.0–100.0)
Platelets: 370 10*3/uL (ref 150–400)
RBC: 2.64 MIL/uL — ABNORMAL LOW (ref 4.22–5.81)
RDW: 13 % (ref 11.5–15.5)
WBC: 3.6 10*3/uL — ABNORMAL LOW (ref 4.0–10.5)
nRBC: 0 % (ref 0.0–0.2)

## 2022-04-25 LAB — PHOSPHORUS
Phosphorus: 3.3 mg/dL (ref 2.5–4.6)
Phosphorus: 3.3 mg/dL (ref 2.5–4.6)

## 2022-04-25 LAB — GLUCOSE, CAPILLARY
Glucose-Capillary: 69 mg/dL — ABNORMAL LOW (ref 70–99)
Glucose-Capillary: 82 mg/dL (ref 70–99)
Glucose-Capillary: 83 mg/dL (ref 70–99)
Glucose-Capillary: 94 mg/dL (ref 70–99)

## 2022-04-25 LAB — MAGNESIUM
Magnesium: 2 mg/dL (ref 1.7–2.4)
Magnesium: 2.1 mg/dL (ref 1.7–2.4)

## 2022-04-25 LAB — C-REACTIVE PROTEIN: CRP: 4.9 mg/dL — ABNORMAL HIGH (ref <1.0)

## 2022-04-25 LAB — SEDIMENTATION RATE: Sed Rate: 86 mm/hr — ABNORMAL HIGH (ref 0–16)

## 2022-04-25 MED ORDER — ACETAMINOPHEN 160 MG/5ML PO SOLN: 650.0000 mg | Freq: Four times a day (QID) | ORAL | Status: DC | PRN

## 2022-04-25 MED ORDER — CHLORHEXIDINE GLUCONATE CLOTH 2 % EX PADS
6.0000 | MEDICATED_PAD | Freq: Every day | CUTANEOUS | Status: DC
  Administered 2022-04-25 – 2022-04-27 (×3): 6 via TOPICAL

## 2022-04-25 MED ORDER — ORAL CARE MOUTH RINSE: 15.0000 mL | OROMUCOSAL | Status: DC | PRN

## 2022-04-25 MED ORDER — ORAL CARE MOUTH RINSE
15.0000 mL | OROMUCOSAL | Status: DC
  Administered 2022-04-25 – 2022-04-27 (×10): 15 mL via OROMUCOSAL

## 2022-04-25 MED ORDER — GUAIFENESIN-DM 100-10 MG/5ML PO SYRP: 5.0000 mL | ORAL_SOLUTION | ORAL | Status: DC | PRN

## 2022-04-25 MED ORDER — GUAIFENESIN-DM 100-10 MG/5ML PO SYRP
5.0000 mL | ORAL_SOLUTION | ORAL | Status: DC | PRN
  Administered 2022-04-25: 5 mL via ORAL
  Filled 2022-04-25: qty 10

## 2022-04-25 MED ORDER — ENOXAPARIN SODIUM 30 MG/0.3ML IJ SOSY
30.0000 mg | PREFILLED_SYRINGE | INTRAMUSCULAR | Status: DC
  Administered 2022-04-25 – 2022-04-26 (×2): 30 mg via SUBCUTANEOUS
  Filled 2022-04-25 (×2): qty 0.3

## 2022-04-25 MED ORDER — JEVITY 1.2 CAL PO LIQD
1000.0000 mL | ORAL | Status: DC
  Administered 2022-04-25 – 2022-04-26 (×2): 1000 mL
  Filled 2022-04-25 (×4): qty 1000

## 2022-04-25 NOTE — TOC Initial Note (Addendum)
Transition of Care [facility]) - Initial/Assessment Note    Patient Details  Name: [name] MRN: [id number] Date of Birth: 05-12-1945  Transition of Care [facility]) CM/SW Contact:    Henrietta Dine, RN Phone Number: 04/25/2022, 2:33 PM  Clinical Narrative:                 [facility] consult for d/c needs; pt does not speak English; he has chronic foley and PEG tube, bedbound with upper and lower contractures active with [facility]'s home hospice program called pt's dtr [name] [phone]); she says pt is from home and plans to return at d/c; she also says he has private pay care 2.5 hrs/day, 7 days per week; she says pt does not experience IPV, food insecurity, or problems paying utilities; she says pt has transportation but rarely leaves home; no PCP listed; pt's dtr says he sees a physician monthly but she can not remember their name; she says  pt does not wear dentures, HA, or glasses; she says he does not have DME but he needs a small wheel chair and shower chair; there are no bars in his shower; [name] says pt does not have [place] services or home oxygen; she also says pt is followed by [facility] and they are aware pt is hospitalized; attempted to speak w/ [facility] regarding pt status and family requests; spoke w/ [place] at agency [phone]); she says will receive a return call.  -[phone]- call back by Charlena Cross, she was notified of pt location and family wishes; she will email hospital liaison and give this CM phone # so pt can be assessed by agency; TOC will follow for d/c needs.  -1612- called by Marcene Brawn at [facility]; she will contact the pt's nurse and family.  Expected Discharge Plan: Home w Hospice Care Barriers to Discharge: Continued Medical Work up   Patient Goals and CMS Choice Patient states their goals for this hospitalization and ongoing recovery are:: home w/ hospice per pt's dtr [name]          Expected Discharge Plan and Services   Discharge Planning Services: CM  Consult Post Acute Care Choice: Resumption of Svcs/PTA Provider [facility] w/ [facility]) Living arrangements for the past 2 months: Single Family Home                                      Prior Living Arrangements/Services Living arrangements for the past 2 months: Single Family Home Lives with:: Adult Children Patient language and need for interpreter reviewed:: Yes Do you feel safe going back to the place where you live?: Yes      Need for Family Participation in Patient Care: Yes (Comment) Care giver support system in place?: Yes (comment) Current home services: Other (comment) (private care 2.5 hrs daily; 7 days/wk) Criminal Activity/Legal Involvement Pertinent to Current Situation/Hospitalization: No - Comment as needed  Activities of Daily Living Home Assistive Devices/Equipment: Feeding equipment ADL Screening (condition at time of admission) Patient's cognitive ability adequate to safely complete daily activities?: Yes Is the patient deaf or have difficulty hearing?: No Does the patient have difficulty seeing, even when wearing glasses/contacts?: No Does the patient have difficulty concentrating, remembering, or making decisions?: Yes Patient able to express need for assistance with ADLs?: No Does the patient have difficulty dressing or bathing?: Yes Independently performs ADLs?: No Communication: Dependent Is this a change from baseline?: Pre-admission baseline Dressing (OT): Dependent Is  this a change from baseline?: Pre-admission baseline Grooming: Dependent Is this a change from baseline?: Pre-admission baseline Feeding: Dependent Is this a change from baseline?: Pre-admission baseline Bathing: Dependent Is this a change from baseline?: Pre-admission baseline Toileting: Dependent Is this a change from baseline?: Pre-admission baseline In/Out Bed: Dependent Is this a change from baseline?: Pre-admission baseline Walks in Home: Dependent Is this a  change from baseline?: Pre-admission baseline Does the patient have difficulty walking or climbing stairs?: Yes Weakness of Legs: Both Weakness of Arms/Hands: Both  Permission Sought/Granted Permission sought to share information with : Case Manager Permission granted to share information with : Yes, Verbal Permission Granted  Share Information with NAME: Ross Coffin, RN, CM     Permission granted to share info w Relationship: [name] (dtr) [phone]     Emotional Assessment   Attitude/Demeanor/Rapport: Unable to Assess Affect (typically observed): Unable to Assess Orientation: :  (unable to assess) Alcohol / Substance Use: Not Applicable Psych Involvement: No (comment)  Admission diagnosis:  Left foot infection [L08.9] Cellulitis of left toe [L03.032] Patient Active Problem List   Diagnosis Date Noted   Cellulitis of left toe 04/24/2022   Severe sepsis ([place]) 10/27/2021   Elevated LFTs 10/27/2021   Acute encephalopathy 10/27/2021   UTI (urinary tract infection) 07/22/2021   FTT (failure to thrive) in adult 07/22/2021   Protein calorie malnutrition ([place]) 07/22/2021   Dysphagia 07/22/2021   Contracture of muscle of lower extremity, bilateral 07/22/2021   Radiation proctitis    Benign neoplasm of sigmoid colon    Gastroesophageal reflux disease without esophagitis    Decreased hemoglobin    Iron deficiency anemia    Leg swelling 01/26/2020   Abdominal pain    Acute pancreatitis    Dysuria 11/18/2017   History of colonic polyps 10/01/2015   Moderate dementia without behavioral disturbance ([place]) 03/22/2015   Right-sided low back pain with right-sided sciatica 11/30/2014   Neuropathy 11/30/2014   Vitamin D insufficiency 07/09/2014   Right-sided low back pain without sciatica 07/06/2014   Memory loss 06/24/2014   Anemia of chronic disease 05/15/2014   Internal hemorrhoids with complication [id number]   Heme positive stool 05/09/2014   Degenerative disc disease,  lumbar 05/06/2014   Foraminal stenosis of lumbar region 05/06/2014   Depression 12/13/2013   Prostate cancer ([place]) 12/13/2013   HYPERCHOLESTEROLEMIA, MILD 05/19/2010   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 [address] Phone: [phone] Fax: [phone]     Social Determinants of Health (SDOH) Social History: SDOH Screenings   Tobacco Use: Medium Risk (10/27/2021)   SDOH Interventions:     Readmission Risk Interventions     No data to display

## 2022-04-25 NOTE — Progress Notes (Signed)
     [name] is a 77 y.o. 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  MRI scan of the left foot was ordered by the hospitalist service yesterday.  It seems due to his condition and contractures he is likely unable to obtain an MRI scan.  He is on IV antibiotics.  There is no change in his condition.  I do speak with his daughter [name] via phone as well as his son who is present at bedside.  Objectyive: Vitals:   04/25/22 0504 04/25/22 0745  BP: 101/61 110/60  Pulse: 84 87  Resp: 20 18  Temp: 98.1 F (36.7 C) 98.8 F (37.1 C)  SpO2: 98% 98%     Exam: Awake and alert Respirations even and unlabored No acute distress  Patient lying in the hospital bed with contracted upper and lower extremities.  He guards and attempted to straighten them.  He does briefly open his eyes today.  Examination left foot shows dorsal dislocation of the hallux MTP joint.  The hallux is held in nearly 90 degrees of extension.  Hallux is very stiff and attempts of correction cause apparent pain and withdrawal in the patient.  Ulceration about the dorsal aspect of the hallux MTP joint noted with some exposed subcutaneous tissue but no obvious tendon or bone.  There is no significant drainage today.  Do not appreciate any significant erythema or palpable fluctuance. His toes appear warm and well-perfused distally.  Unable to assess sensation due to patient condition.  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  We discussed that  the patient is not a surgical candidate due to his overall medical situation and condition.  They voiced recognition and understanding of this.  I have encouraged comfort  measures. He may continue IV antibiotics via hospitalist team.  All questions were answered.  Family voiced agreement with this plan. With regard to this shared decision making, I do not think attempting to proceed with MRI scan makes sense at this time.  Orthopedics will sign off.   [name] J. [place], PA-C

## 2022-04-25 NOTE — Progress Notes (Signed)
Initial Nutrition Assessment  DOCUMENTATION CODES:   Underweight  INTERVENTION:  Initiate continuous tube feeds via G-tube due to risk for refeeding syndrome: -Initiate Jevity 1.2 at 15 mL/hour and advance by 10 mL/hour every 12 hours to goal rate of 55 mL/hour (1320 mL goal daily volume) -Provides 1584 kcal, 73 grams of protein, 1069 mL H2O daily  Provide minimum free water flush of 30 mL every 4 hours per protocol to maintain tube patency. Defer further adjustments to free water flush to MD. Pt currently receiving IV fluids.  Monitor magnesium, potassium, and phosphorus BID for at least 3 days, MD to replete as needed, as pt is at risk for refeeding syndrome.  NUTRITION DIAGNOSIS:   Inadequate oral intake related to inability to eat as evidenced by NPO status.  GOAL:   Patient will meet greater than or equal to 90% of their needs  MONITOR:   Weight trends, Labs, TF tolerance, Skin, I & O's  REASON FOR ASSESSMENT:   Consult Enteral/tube feeding initiation and management  ASSESSMENT:   77 year old male with PMHx of bedbound status with contractures, nonverbal at baseline, PEG tube and Foley catheter dependent, prostate cancer, dementia, failure to thrive, prior recurrent falls, SDH who is admitted from home with hospice with left great toe ulcer with osteomyelitis, dehydration with hyponatremia.  RD is working remotely. Pt is nonverbal and unable to provide any history. [name] at bedside and speaks [place] but is not familiar with home tube feed regimen per discussion with RN. Called patient's daughter over the phone at [phone]. She reported she was busy and did not have time to speak with RD regarding regimen. She was only able to speak for a minute and said patient is no longer receiving Osmolite 1.5 for 8 months as he had diarrhea and that he has been getting either Ensure or Boost 4 times daily, but she was unable to provide any further details on exactly which  supplement, free water flushes, weight history, etc. She does not want him to receive Osmolite any more. Pt is NPO. Daughter then report she had to go and ended phone call. Spoke with [facility]ospice and [place] over the phone but they do not have a lot of information on the tube feed regimen either as they report patient's daughter typically cannot speak long with them. They report they have it documented as Boost 8 oz carton 3 times daily, but unsure of which type of Boost or water flushes. They report if regimen is changed while inpatient they have the ability to provide in home setting including if continuous tube feeds are needed. Later spoke with RN again who was able to report patient's son had a picture of Boost on his phone that is an 8 oz carton with 10 grams of protein (likely Boost Original). If pt is receiving Boost Original 4 times daily, this only provides 960 kcal and 40 grams of protein. Unclear what water flushes pt has been receiving at home. Noted admission with dehydration.  Per review of weight history in chart pt was 54 kg on 07/22/21. He is currently 40.8 kg. He has lost 13.2 kg or 24% weight over the past 9 months, which is significant for time frame.  Concerned with weight loss and wound that pt has, home regimen is not adequate calories. However, unable to truly clarify details of home regimen at this time. Patient is at high risk for refeeding syndrome. After discussing with MD, plan is to  initiate continuous tube feeds at low rate and advance slowly as this is the safest regimen for patient at this time. As daughter does not want pt to receive Osmolite, will trial Jevity 1.2.  Enteral Access: G-tube present on admission  Medications reviewed and include: cefepime, vancomycin, NS at 50 mL/hour  Labs reviewed: Sodium 132, CO2 19 Baseline sodium, potassium, and phosphorus are WNL  UOP: 700 mL  I/O: +1226.1 mL since admission  Discussed with RN.  Patient is at  risk for malnutrition. Unable to complete NFPE at this time as RD is working remotely. Recommend completing at follow-up as able to better assess nutrition status.  NUTRITION - FOCUSED PHYSICAL EXAM:  Unable to complete as RD is working remotely.  Diet Order:   Diet Order     None      EDUCATION NEEDS:   Not appropriate for education at this time  Skin:  Skin Integrity Issues:: Other (Comment) Other: wound to left great toe  Last BM:  Unknown  Height:   Ht Readings from Last 1 Encounters:  04/24/22 5' 1"$  (1.549 m)   Weight:   Wt Readings from Last 1 Encounters:  04/24/22 40.8 kg   Ideal Body Weight:  50.9 kg  BMI:  Body mass index is 17 kg/m.  Estimated Nutritional Needs:   Kcal:  1450-1650  Protein:  70-90 grams  Fluid:  1.4-1.6 L/day or per MD  Loanne Drilling, MS, RD, LDN, CNSC Pager number available on Amion
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
## 2022-04-25 NOTE — Progress Notes (Signed)
1540-1840 hrs  Blood sugar 69 tube feeding started. Repeat blood sugar 82

## 2022-04-25 NOTE — Progress Notes (Addendum)
PROGRESS NOTE   [name]  [id number]    DOB: Dec 25, 1945    DOA: 04/24/2022  PCP: Pcp, No   I have briefly reviewed patients previous medical records in [facility].   Brief Narrative:  77 year old male from home with home hospice, medical history significant for what appears to be bedbound with contractures, nonverbal at baseline, PEG tube and Foley catheter dependent, prostate cancer, dementia, failure to thrive, prior recurrent falls, SDH, most recent hospitalization in August 2023 for acute metabolic encephalopathy, sepsis due to complicated UTI, extensive [place] discussions were had with family/daughter, presented to the [facility] ED on 04/24/2022 due to progressive left great toe infection x 2 months, recently completed a course of antibiotics by PCP but developed fever of 101 F.  Admitted for suspected left great toe osteomyelitis.  Orthopedics consulted.   Assessment & Plan:  Principal Problem:   Cellulitis of left toe   Left great toe ulcer with osteomyelitis: External findings as an picture noted below.  Not much cellulitis on exam.  X-ray of left foot shows dislocation of great toe MTP joint, subcutaneous air and possible soft tissue infection in the region of the great toe MTP joint with moderate erosion of the distal aspect of the first metatarsal suspicious for osteomyelitis.  CRP 4.9.  ESR 86.  Lactate normal.  Mild leukopenia.  Empirically on IV cefepime and vancomycin.  MRI of left foot has been ordered but unsure if patient will actually be able to perform this given his contractures and may not be able to get into the machine.  Per orthopedics discussion with daughter, they do wish to proceed with surgery if indicated.  Overall extremely poor prognosis and ideally should be full comfort care and back in home hospice but from review of prior records of prior hospitalization, family appeared to be resistant to transitioning to comfort care only.  Will attempt to speak  with daughter.  DC IVF.  Continue PEG tube feeds and meds.  Addendum: Orthopedics follow-up 2/10 appreciated.  They discussed in detail with patient's daughter and son and explained that patient was not a surgical candidate due to his overall medical situation.  They voiced recognition and understanding.  They encouraged comfort measures.  However as per palliative care team input, patient and family wish to pursue aggressive care.  MRI of the foot confirms acute osteomyelitis of the first metatarsal and proximal phalanx of the great toe, soft tissue wound or ulceration underlying the plantar aspect of the first metatarsal head with surrounding cellulitis but no drainable fluid, dorsal dislocation of the first MTP joint.  It may be hard to place a PICC line for prolonged antibiotics given his severe contractures.  Will discuss with infectious disease MD on-call tomorrow regarding oral antibiotic options.  Dehydration with hyponatremia: Improved.  Remains at high risk for recurrence of the same.  Continue PEG tube feeds.  Anemia of chronic disease: Baseline hemoglobin in the low 9 g range.  Hemoglobin is slightly dropped from 9.6-8.6, possibly hemodilutional.  Mild leukopenia.  Follow CBC in AM.  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  TOC to update hospice services.  Body mass index is 17 kg/m.  Nutritional Status        Pressure Ulcer: Pressure Injury 04/24/22 Toe (Comment  which one) Anterior;Left (Active)  04/24/22 1600  Location: Toe (Comment  which one)  Location Orientation: Anterior;Left  Staging:   Wound Description (Comments):  Present on Admission: Yes  Dressing Type Foam - Lift dressing to assess site every shift 04/25/22 0815     DVT prophylaxis: enoxaparin (LOVENOX) injection 40 mg Start: 04/24/22 1800     Code Status: Full Code:  Family Communication: Discussed in detail with patient's  daughter via phone. Disposition:  Status is: Inpatient Remains inpatient appropriate because: IV antibiotics     Consultants:   Orthopedics  Procedures:     Antimicrobials:   IV cefepime and vancomycin 2/9 >   Subjective:  Patient nonverbal.  Evaluated along with nursing and nurse techs at bedside who are cleaning him.  Objective:   Vitals:   04/24/22 2100 04/24/22 2345 04/25/22 0504 04/25/22 0745  BP:  108/61 101/61 110/60  Pulse:  90 84 87  Resp:  15 20 18  $ Temp: 97.9 F (36.6 C) 98 F (36.7 C) 98.1 F (36.7 C) 98.8 F (37.1 C)  TempSrc: Axillary Axillary Oral Oral  SpO2:  99% 98% 98%  Weight:      Height:        General exam: Elderly male, small built, frail, chronically ill looking and cachectic, severe contractures of upper and lower extremities, eyes open and may be tracking but unsure, edentulous, nonverbal and noncommunicative. Respiratory system: Clear to auscultation. Respiratory effort normal. Cardiovascular system: S1 & S2 heard, RRR. No JVD, murmurs, rubs, gallops or clicks. No pedal edema. Gastrointestinal system: Abdomen is nondistended, soft and nontender. No organomegaly or masses felt. Normal bowel sounds heard.  Has PEG tube, site intact without acute findings. Central nervous system: Mental status as noted above.  No focal neurological deficits appreciated. Extremities: Extreme contractures of all 4 extremities as noted in picture below from 2/10. Skin: Ulceration approximately over the ball of the left great toe with no surrounding erythema, tenderness or fluctuance.  Currently without drainage although orthopedics appreciated bloody drainage yesterday.  Deformed left first great toe.  Details as in picture below from 2/10. Psychiatry: Judgement and insight appear normal. Mood & affect appropriate.        Data Reviewed:   I have personally reviewed following labs and imaging studies   CBC: Recent Labs  Lab 04/24/22 1211 04/25/22 0531   WBC 5.8 3.6*  NEUTROABS 4.1  --   HGB 9.6* 8.6*  HCT 28.8* 26.2*  MCV 97.6 99.2  PLT 411* 0000000    Basic Metabolic Panel: Recent Labs  Lab 04/24/22 1211 04/25/22 0531  NA 128* 132*  K 4.5 3.9  CL 96* 105  CO2 22 19*  GLUCOSE 92 81  BUN 22 17  CREATININE 0.84 0.64  CALCIUM 8.6* 8.1*  MG  --  2.0  PHOS  --  3.3    Liver Function Tests: Recent Labs  Lab 04/24/22 1211 04/25/22 0531  AST 33 22  ALT 22 17  ALKPHOS 60 51  BILITOT 0.5 0.8  PROT 7.2 5.9*  ALBUMIN 3.0* 2.5*    CBG: No results for input(s): "GLUCAP" in the last 168 hours.  Microbiology Studies:   Recent Results (from the past 240 hour(s))  Culture, blood (routine x 2)     Status: None (Preliminary result)   Collection Time: 04/24/22 12:35 PM   Specimen: BLOOD LEFT HAND  Result Value Ref Range Status   Specimen Description   Final    BLOOD LEFT HAND Performed at [facility], [place] [address] [place], [place] [address]    Special Requests   Final    BOTTLES DRAWN AEROBIC AND  ANAEROBIC Blood Culture results may not be optimal due to an inadequate volume of blood received in culture bottles Performed at [facility], [place] [address], [place], [place] [address]    Culture   Final    NO GROWTH < 24 HOURS Performed at [place] [address] [place], [place] [address]    Report Status PENDING  Incomplete  Culture, blood (routine x 2)     Status: None (Preliminary result)   Collection Time: 04/24/22  3:40 PM   Specimen: BLOOD  Result Value Ref Range Status   Specimen Description   Final    BLOOD LEFT ANTECUBITAL Performed at [place] [address] [place], [place] [address]    Special Requests   Final    BOTTLES DRAWN AEROBIC AND ANAEROBIC Blood Culture results may not be optimal due to an inadequate volume of blood received in culture bottles Performed at [place] [address] [place], [place]  [address]    Culture   Final    NO GROWTH < 12 HOURS Performed at [place] [address], [place], [place] [address]    Report Status PENDING  Incomplete  MRSA Next Gen by PCR, Nasal     Status: None   Collection Time: 04/24/22  7:06 PM   Specimen: Nasal Mucosa; Nasal Swab  Result Value Ref Range Status   MRSA by PCR Next Gen NOT DETECTED NOT DETECTED Final    Comment: (NOTE) The GeneXpert MRSA Assay (FDA approved for NASAL specimens only), is one component of a comprehensive MRSA colonization surveillance program. It is not intended to diagnose MRSA infection nor to guide or monitor treatment for MRSA infections. Test performance is not FDA approved in patients less than 64 years old. Performed at [facility], [place] [address], [place], [place] [address]     Radiology Studies:  DG Foot Complete Left  Result Date: 04/24/2022 CLINICAL DATA:  Ulceration of the left foot.  Infection. EXAM: LEFT FOOT - COMPLETE 3+ VIEW COMPARISON:  Left tibia and fibula radiographs 01/26/2020, left foot radiographs 05/07/2014 FINDINGS: There is dislocation of the great toe metatarsophalangeal joint with the phalanges directed dorsally, posteriorly, and laterally with respect to the distal aspect of the metatarsal. There appears be moderate erosion of the distal aspect of the first metatarsal. There are subcutaneous lucencies suggesting air and possible soft tissue infection in the region of the great toe metatarsophalangeal joint. Mild dorsal talonavicular degenerative spurring. No acute fracture. IMPRESSION: 1. Dislocation of the great toe metatarsophalangeal joint. The phalanges are directed dorsally and laterally, predominantly perpendicular to the expected direction of the metatarsophalangeal joint. 2. Subcutaneous lucencies suggesting air and possible soft tissue infection in the region of the great toe metatarsophalangeal joint. Moderate erosion of the distal aspect of the first  metatarsal, suspicious for osteomyelitis. Electronically Signed   By: Yvonne Kendall M.D.   On: 04/24/2022 12:29    Scheduled Meds:    Chlorhexidine Gluconate Cloth  6 each Topical Daily   enoxaparin (LOVENOX) injection  40 mg Subcutaneous Q24H   famotidine  20 mg Oral BID   midodrine  2.5 mg Oral TID WC    Continuous Infusions:    sodium chloride 75 mL/hr at 04/25/22 0815   ceFEPime (MAXIPIME) IV Stopped (04/25/22 0533)   vancomycin       LOS: 1 day     Vernell Leep, MD,  FACP, FHM, SFHM, [place], [id number]  [place]     To contact the attending provider between 7A-7P or the covering provider during after hours 7P-7A, please log into the web site [email or web address] and access using universal [place] password for that web site. If you do not have the password, please call the hospital operator.  04/25/2022, 9:23 AM

## 2022-04-25 NOTE — Progress Notes (Signed)
[place] Patient  Current hospice patient followed at home for hospice diagnosis of CVA who was admitted to [facility] on 2.9.2024 with Left great toe cellulitis and suspected osteomyelitis.  Per Dr. Jewel Baize, hospice MD, this is a related hospital admission.  Follow up with patient, found patient lying on his left side.  Patient with severe contractures.  Patient is non-verbal.  No family present at the bedside.  Spoke with Sheran Spine, TOC regarding patient's current status with [facility].  Patient remains GIP appropriate at this time as he is requiring frequent skilled nursing care and intervention for suspected osteomyelitis.  L great toe cellulitis (suspected osteomyelitis)  - Seen by ortho 2.10 - IV NS 75 cc/hr  - IV cefepime 2 g q12  - IV vancomycin 560m daily - IV Zosyn 3.375 g x 1 dose on 2.9.24 - L foot MRI ordered- unable to perform MRI due to patients contractures.    2. Acute hypoNa+  - IV fluids as above  - Sodium 132    DVT prophylaxis: Lovenox 40 mg sq daily     Advance Care Planning: CODE STATUS FULL.  Patient had Palliative Medicine Consult today to further discuss goals of care with the family in the setting of poor prognosis.  Family endorses wanting to treat the treatable until the end, for him to remain a full code and for no one else to discuss his poor prognosis with them.  Discharge Planning- Ongoing, back to home once stable Family Contact-  TLauritz Tungate daughter, not present at hospital during my visit.   IDT: updated and ongoing. Goals of Care:  Family wants patient to remain a full code.   Remains a hospice patient, family wants to continue IV antibiotics and further treatment.  Hospital team recommending comfort care due to patient's poor prognosis and current medical situation, as patient is not a surgical candidate.  Spoke with patient's daughter, TRufas Scronce who confirms families wishes to  continue with aggressive treatment course.

## 2022-04-26 ENCOUNTER — Encounter (HOSPITAL_COMMUNITY): Payer: Self-pay | Admitting: Internal Medicine

## 2022-04-26 DIAGNOSIS — M869 Osteomyelitis, unspecified: Secondary | ICD-10-CM | POA: Diagnosis not present

## 2022-04-26 DIAGNOSIS — L089 Local infection of the skin and subcutaneous tissue, unspecified: Secondary | ICD-10-CM | POA: Diagnosis not present

## 2022-04-26 DIAGNOSIS — M86172 Other acute osteomyelitis, left ankle and foot: Secondary | ICD-10-CM | POA: Diagnosis not present

## 2022-04-26 DIAGNOSIS — L03032 Cellulitis of left toe: Secondary | ICD-10-CM | POA: Diagnosis not present

## 2022-04-26 LAB — MAGNESIUM
Magnesium: 2.1 mg/dL (ref 1.7–2.4)
Magnesium: 2.1 mg/dL (ref 1.7–2.4)

## 2022-04-26 LAB — PHOSPHORUS
Phosphorus: 3.2 mg/dL (ref 2.5–4.6)
Phosphorus: 2.9 mg/dL (ref 2.5–4.6)

## 2022-04-26 LAB — GLUCOSE, CAPILLARY
Glucose-Capillary: 49 mg/dL — ABNORMAL LOW (ref 70–99)
Glucose-Capillary: 277 mg/dL — ABNORMAL HIGH (ref 70–99)
Glucose-Capillary: 124 mg/dL — ABNORMAL HIGH (ref 70–99)
Glucose-Capillary: 109 mg/dL — ABNORMAL HIGH (ref 70–99)
Glucose-Capillary: 104 mg/dL — ABNORMAL HIGH (ref 70–99)
Glucose-Capillary: 112 mg/dL — ABNORMAL HIGH (ref 70–99)

## 2022-04-26 LAB — BASIC METABOLIC PANEL
Anion gap: 8 (ref 5–15)
BUN: 19 mg/dL (ref 8–23)
CO2: 19 mmol/L — ABNORMAL LOW (ref 22–32)
Calcium: 8.3 mg/dL — ABNORMAL LOW (ref 8.9–10.3)
Chloride: 105 mmol/L (ref 98–111)
Creatinine, Ser: 0.89 mg/dL (ref 0.61–1.24)
GFR, Estimated: >60 mL/min (ref >60)
Glucose, Bld: 265 mg/dL — ABNORMAL HIGH (ref 70–99)
Potassium: 3.7 mmol/L (ref 3.5–5.1)
Sodium: 132 mmol/L — ABNORMAL LOW (ref 135–145)

## 2022-04-26 LAB — CBC
HCT: 27.3 % — ABNORMAL LOW (ref 39.0–52.0)
Hemoglobin: 9.1 g/dL — ABNORMAL LOW (ref 13.0–17.0)
MCH: 32.7 pg (ref 26.0–34.0)
MCHC: 33.3 g/dL (ref 30.0–36.0)
MCV: 98.2 fL (ref 80.0–100.0)
Platelets: 371 10*3/uL (ref 150–400)
RBC: 2.78 MIL/uL — ABNORMAL LOW (ref 4.22–5.81)
RDW: 12.8 % (ref 11.5–15.5)
WBC: 3.5 10*3/uL — ABNORMAL LOW (ref 4.0–10.5)
nRBC: 0 % (ref 0.0–0.2)

## 2022-04-26 MED ORDER — AMOXICILLIN-POT CLAVULANATE 400-57 MG/5ML PO SUSR
800.0000 mg | Freq: Two times a day (BID) | ORAL | Status: DC
  Administered 2022-04-26 – 2022-04-27 (×2): 800 mg
  Filled 2022-04-26 (×5): qty 10

## 2022-04-26 MED ORDER — DEXTROSE 50 % IV SOLN: 25.0000 g | INTRAVENOUS | Status: AC

## 2022-04-26 MED ORDER — DEXTROSE 50 % IV SOLN
INTRAVENOUS | Status: AC
  Administered 2022-04-26: 25 g via INTRAVENOUS
  Filled 2022-04-26: qty 50

## 2022-04-26 MED ORDER — DOXYCYCLINE HYCLATE 100 MG PO TABS
100.0000 mg | ORAL_TABLET | Freq: Two times a day (BID) | ORAL | Status: DC
  Administered 2022-04-26 – 2022-04-27 (×2): 100 mg
  Filled 2022-04-26 (×2): qty 1

## 2022-04-26 NOTE — TOC Progression Note (Addendum)
Transition of Care [facility]) - Progression Note    Patient Details  Name: [name] MRN: [id number] Date of Birth: Jan 28, 1946  Transition of [facility]) CM/SW Contact  Henrietta Dine, RN Phone Number: 04/26/2022, 10:09 AM  Clinical Narrative:    TOC called b/c pt's dtr would like to discuss new agency for pt; spoke w/ pt's dtr Sopha in room; she says she would like a new hospice agency b/c the current one does not take good care of the pt, and agency does not advocate for him; she would like to be referred to [place]; called agency and spoke w/ Joelene Millin; she says an on-call triage rep will return call; awaiting call back.  -1018- Call back by Tharon Aquas, RN at [facility]; she says their Medical MD must approve transfer;, and the agency may be able to assume care 04/27/22; she will contact pt's dtr to discuss; pt's dtr [name] notified; TOC will con't to follow.  -1350- notified pt's dtr would like to speak w/ CM; spoke w/ her in pt's room; she is concerned about pt being d/c'd and she had Medicare Rep on phone at time of conversation; [name] says last time pt was here "he was kept for 10 days; she also says the ED MD told her the pt's TF and antibiotics were not strong enough"; this CM clarified w/ Medicare Rep that d/c orders have not received for pt; she informed pt to call back when d/c orders have been placed; rep then disconnected call; explained to [name] appeal process cannot commence be filed today b/c d/c order has not been placed; spoke w/ Dr Lindaann Pascal and he says pt will not d/c tomorrow; [name] informed; and verbalized understanding; pt has been seen by nutrition; please see note dtd 04/1022; TOC will con't to follow. Expected Discharge Plan: Home w Hospice Care Barriers to Discharge: Continued Medical Work up  Expected Discharge Plan and Services   Discharge Planning Services: CM Consult Post Acute Care Choice: Resumption of Svcs/PTA Provider [facility] w/  [facility]) Living arrangements for the past 2 months: Single Family Home                                       Social Determinants of Health (SDOH) Interventions SDOH Screenings   Tobacco Use: Medium Risk (04/26/2022)    Readmission Risk Interventions     No data to display

## 2022-04-26 NOTE — Progress Notes (Signed)
PROGRESS NOTE   [name]  [id number]    DOB: [birth year]    DOA: 04/24/2022  PCP: Pcp, No   I have briefly reviewed patients previous medical records in [facility]   Brief Narrative:  77 year old male from home with home hospice, medical history significant for what appears to be bedbound with contractures, nonverbal at baseline, PEG tube and Foley catheter dependent, prostate cancer, dementia, failure to thrive, prior recurrent falls, SDH, most recent hospitalization in August 2023 for acute metabolic encephalopathy, sepsis due to complicated UTI, extensive [place] discussions were had with family/daughter, presented to the [facility] ED on 04/24/2022 due to progressive left great toe infection x 2 months, recently completed a course of antibiotics by PCP but developed fever of 101 F.  Admitted for suspected left great toe osteomyelitis.  Orthopedics consulted, stated that patient is not a surgical candidate and signed off.  Palliative care consulted, family wish to pursue aggressive care.   Assessment & Plan:  Principal Problem:   Cellulitis of left toe   Left great toe ulcer with osteomyelitis: External findings as an picture noted below.  Not much cellulitis on exam.  X-ray of left foot shows dislocation of great toe MTP joint, subcutaneous air and possible soft tissue infection in the region of the great toe MTP joint with moderate erosion of the distal aspect of the first metatarsal suspicious for osteomyelitis.  CRP 4.9.  ESR 86.  Lactate normal.  MRSA PCR negative.  Blood cultures x 2: Negative to date.  Mild leukopenia.  Empirically on IV cefepime and vancomycin.  Orthopedics follow-up 2/10 appreciated.  They discussed in detail with patient's daughter and son and explained that patient was not a surgical candidate due to his overall medical situation, encouraged comfort measures and signed off.  However as per palliative care team input, patient and family wish to pursue  aggressive care.  MRI of the foot confirms acute osteomyelitis of the first metatarsal and proximal phalanx of the great toe, soft tissue wound or ulceration underlying the plantar aspect of the first metatarsal head with surrounding cellulitis but no drainable fluid, dorsal dislocation of the first MTP joint.  It may be hard to place a PICC line for prolonged antibiotics given his severe contractures.  Discussed with infectious disease MD on-call on 04/26/2022 who agrees with no IV antibiotics and recommends doxycycline and Augmentin via PEG tube x 6 weeks.  Discussed in detail with patient's daughter at bedside and advised that the infection with any form of antibiotics is less likely to heal given his overall condition.  Even though it seems that she understands, there seem to be quite a bit of unrealistic expectations.  Dehydration with hyponatremia: Improved.  Remains at high risk for recurrence of the same.  Continue PEG tube feeds.  As per discussion with registered dietitian on 2/10, initiated tube feeding at slow rate to avoid refeeding syndrome.  Hypoglycemia: Had CBGs of 69 followed by 49 in the last 24 hours.  Treated per hypoglycemia protocol.  Monitor closely and treat as per hypoglycemia protocol.  Anemia of chronic disease: Baseline hemoglobin in the low 9 g range.  Hemoglobin is slightly dropped from 9.6-8.6, possibly hemodilutional.  Mild leukopenia.  Stable.  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  2/11: Daughter indicated that they were not satisfied with the current hospice services and wanted to speak with the social worker to switch home hospice service.  TOC consulted.  Body mass index is 17 kg/m.  Nutritional Status Nutrition Problem: Inadequate oral intake Etiology: inability to eat Signs/Symptoms: NPO status (reliance on tube feeds via G-tube to meet nutrition needs) Interventions: Refer to RD  note for recommendations  Pressure Ulcer: Pressure Injury 04/24/22 Toe (Comment  which one) Anterior;Left (Active)  04/24/22 1600  Location: Toe (Comment  which one)  Location Orientation: Anterior;Left  Staging:   Wound Description (Comments):   Present on Admission: Yes  Dressing Type Foam - Lift dressing to assess site every shift 04/25/22 2000     DVT prophylaxis: enoxaparin (LOVENOX) injection 30 mg Start: 04/25/22 2200     Code Status: Full Code:  Family Communication: Discussed in detail with patient's daughter at bedside. Disposition:  Monitor for the next 24 hours for recurrent hypoglycemia, if none, patient will be medically optimized for DC to home with hospice on oral antibiotics via PEG tube.     Consultants:   Orthopedics  Procedures:     Antimicrobials:   IV cefepime and vancomycin 2/9 > 2/11 Augmentin and doxycycline via PEG tube 2/11 >   Subjective:  Patient nonverbal, unchanged.  Has smart phone with music playing at bedside.  Son sleeping at bedside.  Daughter sitting in states that patient is doing "good".  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  Unchanged.  Does not look in any discomfort or pain. Respiratory system: Poor inspiratory effort.  Clear to auscultation.  No increased work of breathing. Cardiovascular system: S1 & S2 heard, RRR. No JVD, murmurs, rubs, gallops or clicks. No pedal edema. Gastrointestinal system: Abdomen is nondistended, soft and  nontender. No organomegaly or masses felt. Normal bowel sounds heard.  Has PEG tube, site intact without acute findings. Central nervous system: Mental status as noted above.  No focal neurological deficits appreciated. Extremities: Extreme contractures of all 4 extremities as noted in picture below from 2/10. Skin: 2/10: Ulceration approximately over the ball of the left great toe with no surrounding erythema, tenderness or fluctuance.  Currently without drainage although orthopedics appreciated bloody drainage yesterday.  Deformed left first great toe.  Details as in picture below from 2/10.  2/11: Dressing to left big toe clean and dry, did not remove dressing to examine Psychiatry: Judgement and insight impaired. Mood & affect cannot be assessed.       Data Reviewed:   I have personally reviewed following labs and imaging studies   CBC: Recent Labs  Lab 04/24/22 1211 04/25/22 0531 04/26/22 0451  WBC 5.8 3.6* 3.5*  NEUTROABS 4.1  --   --   HGB 9.6* 8.6* 9.1*  HCT 28.8* 26.2* 27.3*  MCV 97.6 99.2 98.2  PLT 411* 370 [id number]    Basic Metabolic Panel: Recent Labs  Lab 04/24/22 1211 04/25/22 0531 04/25/22 1707 04/26/22 0451  NA 128* 132*  --  132*  K 4.5 3.9  --  3.7  CL 96* 105  --  105  CO2 22 19*  --  19*  GLUCOSE 92 81  --  265*  BUN 22 17  --  19  CREATININE 0.84 0.64  --  0.89  CALCIUM 8.6* 8.1*  --  8.3*  MG  --  2.0 2.1 2.1  PHOS  --  3.3 3.3 3.2    Liver Function Tests: Recent Labs  Lab 04/24/22 1211 04/25/22 0531  AST 33 22  ALT 22 17  ALKPHOS 60 51  BILITOT 0.5 0.8  PROT 7.2 5.9*  ALBUMIN 3.0* 2.5*    CBG: Recent Labs  Lab 04/26/22 0449 04/26/22 0739 04/26/22 1151  GLUCAP 277* 124* 109*    Microbiology Studies:   Recent Results (from the past 240 hour(s))  Culture, blood (routine x 2)     Status: None (Preliminary result)   Collection Time: 04/24/22 12:35 PM   Specimen: BLOOD LEFT HAND  Result Value Ref Range Status   Specimen  Description   Final    BLOOD LEFT HAND Performed at [facility] [address] [place], [place] [address]    Special Requests   Final    BOTTLES DRAWN AEROBIC AND ANAEROBIC Blood Culture results may not be optimal due to an inadequate volume of blood received in culture bottles Performed at [facility] [address], [place], [place] [address]    Culture   Final    NO GROWTH 2 DAYS Performed at [facility] [address], [place], [place] [address]    Report Status PENDING  Incomplete  Culture, blood (routine x 2)     Status: None (Preliminary result)   Collection Time: 04/24/22  3:40 PM   Specimen: BLOOD  Result Value Ref Range Status   Specimen Description   Final    BLOOD LEFT ANTECUBITAL Performed at [facility] [address] [place], [place] [address]    Special Requests   Final    BOTTLES DRAWN AEROBIC AND ANAEROBIC Blood Culture results may not be optimal due to an inadequate volume of blood received in culture bottles Performed at [facility] [address] [place], [place] [address]    Culture   Final    NO GROWTH 2 DAYS Performed at [facility] [address] [place], [place] [address]    Report Status PENDING  Incomplete  MRSA Next Gen by PCR, Nasal     Status: None   Collection Time: 04/24/22  7:06 PM   Specimen: Nasal Mucosa; Nasal Swab  Result Value Ref Range Status   MRSA by PCR Next Gen NOT DETECTED NOT DETECTED Final    Comment: (NOTE) The GeneXpert MRSA Assay (FDA approved for NASAL specimens only), is one component of a comprehensive MRSA colonization surveillance program. It is not intended to diagnose MRSA infection nor to guide or monitor treatment for MRSA infections. Test performance is not FDA approved in patients less than 11 years old. Performed at [facility], [place] [address], [place],  [address]     Radiology  Studies:  MR FOOT LEFT WO CONTRAST  Result Date: 04/25/2022 CLINICAL DATA:  Left foot ulceration, osteomyelitis EXAM: MRI OF THE LEFT FOOT WITHOUT CONTRAST TECHNIQUE: Multiplanar, multisequence MR imaging of the left forefoot was performed. No intravenous contrast was administered. COMPARISON:  X-ray 04/24/2022 FINDINGS: Technical Note: Despite efforts by the technologist and patient, motion artifact is present on today's exam and  could not be eliminated. This reduces exam sensitivity and specificity. Bones/Joint/Cartilage Dorsal dislocation of the first MTP joint with the great toe at 90 degrees of dorsiflexion and laterally rotated. Extensive bone marrow edema throughout the distal aspect of the first metatarsal extending to the proximal metaphysis with intermediate to low T1 marrow signal compatible with osteomyelitis. There is erosion along the plantar aspect of the first metatarsal head. Bone marrow edema and mild T1 signal changes within the proximal phalanx of the great toe is also compatible with osteomyelitis. Remaining osseous structures of the forefoot demonstrate preserved bone marrow signal without additional site of osteomyelitis. No fracture. No additional dislocation. Ligaments Disruption of the first MTP joint capsule. Intact Lisfranc ligament. Muscles and Tendons Denervation changes of the foot musculature. FHL tendon remains intact although is laterally dislocated into the first intermetatarsal space. No tenosynovitis. Soft tissues Soft tissue wound or ulceration underlying the plantar aspect of the first metatarsal head with surrounding soft tissue edema. No drainable fluid collections. IMPRESSION: 1. Acute osteomyelitis of the first metatarsal and proximal phalanx of the great toe. 2. Soft tissue wound or ulceration underlying the plantar aspect of the first metatarsal head with surrounding cellulitis. No drainable fluid collections. 3. Dorsal dislocation of the first MTP joint.  Electronically Signed   By: Davina Poke D.O.   On: 04/25/2022 14:41    Scheduled Meds:    amoxicillin-clavulanate  800 mg Per Tube Q12H   Chlorhexidine Gluconate Cloth  6 each Topical Daily   doxycycline  100 mg Per Tube Q12H   enoxaparin (LOVENOX) injection  30 mg Subcutaneous Q24H   mouth rinse  15 mL Mouth Rinse 4 times per day    Continuous Infusions:    sodium chloride 50 mL/hr at 04/26/22 0947   feeding supplement (JEVITY 1.2 CAL) 25 mL/hr at 04/26/22 0435     LOS: 2 days     Vernell Leep, MD,  FACP, [facility], [facility], [facility], [place]     To contact the attending provider between 7A-7P or the covering provider during after hours 7P-7A, please log into the web site [email or web address] and access using universal [place] password for that web site. If you do not have the password, please call the hospital operator.  04/26/2022, 2:14 PM

## 2022-04-26 NOTE — Progress Notes (Signed)
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 a current hospice patient with a terminal diagnosis end stage cerebral vascular disease. Mr. Gottshall resides at home and was transferred to [facility] ED. Patient was admitted to [facility] on 2.9.2024 with Left great toe cellulitis and suspected osteomyelitis. Per Dr. Gilford Rile with [facility], this is a related hospital admission.    Visited patient at bedside and patient resting with occasional moans, severe contractures & open mouth breathing only. Family, daughter/[name] & son, present. Initially this AM, [name] voiced interest to [facility] that she is interested in exploring alternative hospice agencies. MSW spoke with [name] at length regarding this transition and she reports that she voiced this interest because she was upset. [name] reports that she would like to adjust her home care team which MSW reports that she will share with appropriate [facility] staff. MSW provided space for [name] to share additional questions/concerns and is thankful for MSW presence. No additional concerns or questions at the conclusion of MSW visit.   Patient remains GIP appropriate due to: requiring frequent skilled nursing care and intervention for suspected osteomyelitis.   I&O: Intake/Output Summary (Last 24 hours) at 04/26/2022 1700 Last data filed at 04/26/2022 1632 Gross per 24 hour  Intake 1920.02 ml  Output 1400 ml  Net 520.02 ml   Abnormal Labs:   Latest Reference Range & Units 04/26/22 04:51  Sodium 135 - 145 mmol/L 132 (L)  CO2 22 - 32 mmol/L 19 (L)  Glucose 70 - 99 mg/dL 265 (H)  Calcium 8.9 - 10.3 mg/dL 8.3 (L)  WBC 4.0 - 10.5 K/uL 3.5 (L)  RBC 4.22 - 5.81 MIL/uL 2.78 (L)  Hemoglobin 13.0 - 17.0 g/dL 9.1 (L)  HCT 39.0 - 52.0 % 27.3 (L)   Diagnostics:  None new to report.   IV/PRN Meds:  acetaminophen (TYLENOL) oral liquid 160 mg/5 mL, albuterol, guaiFENesin-dextromethorphan, mouth rinseacetaminophen (TYLENOL) oral liquid 160 mg/5 mL, 650  mg, Q6H PRN albuterol, 2.5 mg, Q4H PRN guaiFENesin-dextromethorphan, 5 mL, Q4H PRN mouth rinse, 15 mL, PRN  Problem List: Principal Problem:   Cellulitis of left toe  Left great toe ulcer with osteomyelitis: External findings as an picture noted below.  Not much cellulitis on exam.  X-ray of left foot shows dislocation of great toe MTP joint, subcutaneous air and possible soft tissue infection in the region of the great toe MTP joint with moderate erosion of the distal aspect of the first metatarsal suspicious for osteomyelitis.  CRP 4.9.  ESR 86.  Lactate normal.  MRSA PCR negative.  Blood cultures x 2: Negative to date.  Mild leukopenia.  Empirically on IV cefepime and vancomycin.  Orthopedics follow-up 2/10 appreciated.  They discussed in detail with patient's daughter and son and explained that patient was not a surgical candidate due to his overall medical situation, encouraged comfort measures and signed off.  However as per palliative care team input, patient and family wish to pursue aggressive care.  MRI of the foot confirms acute osteomyelitis of the first metatarsal and proximal phalanx of the great toe, soft tissue wound or ulceration underlying the plantar aspect of the first metatarsal head with surrounding cellulitis but no drainable fluid, dorsal dislocation of the first MTP joint.  It may be hard to place a PICC line for prolonged antibiotics given his severe contractures.  Discussed with infectious disease MD on-call on 04/26/2022 who agrees with no IV antibiotics and recommends doxycycline and Augmentin via PEG tube x 6 weeks.  Discussed in detail with patient's daughter at bedside and advised that the infection with any form of antibiotics is less likely to heal given his overall condition.  Even though it seems that she understands, there seem to be quite a bit of unrealistic expectations. Dehydration with hyponatremia: Improved.  Remains at high risk for recurrence of the same.   Continue PEG tube feeds.  As per discussion with registered dietitian on 2/10, initiated tube feeding at slow rate to avoid refeeding syndrome. Hypoglycemia: Had CBGs of 69 followed by 49 in the last 24 hours.  Treated per hypoglycemia protocol.  Monitor closely and treat as per hypoglycemia protocol. Anemia of chronic disease: Baseline hemoglobin in the low 9 g range.  Hemoglobin is slightly dropped from 9.6-8.6, possibly hemodilutional.  Mild leukopenia.  Stable. 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  Family Contact:  Daughter present at bedside, refer to above.    IDT:  Updated   Goals of Care:  Family would like to continue current treatment plan. FULL CODE   [name], MSW [place]

## 2022-04-26 NOTE — Progress Notes (Signed)
Patient with BG of 49. Notified MD as pt has no hx DM. Orders received to initiate hypoglycemic protocol and give D50. Re-check of BG 277.

## 2022-04-27 DIAGNOSIS — L03032 Cellulitis of left toe: Secondary | ICD-10-CM | POA: Diagnosis not present

## 2022-04-27 DIAGNOSIS — M869 Osteomyelitis, unspecified: Secondary | ICD-10-CM | POA: Diagnosis not present

## 2022-04-27 DIAGNOSIS — M86172 Other acute osteomyelitis, left ankle and foot: Secondary | ICD-10-CM | POA: Diagnosis not present

## 2022-04-27 DIAGNOSIS — L089 Local infection of the skin and subcutaneous tissue, unspecified: Secondary | ICD-10-CM | POA: Diagnosis not present

## 2022-04-27 LAB — GLUCOSE, CAPILLARY
Glucose-Capillary: 114 mg/dL — ABNORMAL HIGH (ref 70–99)
Glucose-Capillary: 124 mg/dL — ABNORMAL HIGH (ref 70–99)
Glucose-Capillary: 124 mg/dL — ABNORMAL HIGH (ref 70–99)
Glucose-Capillary: 93 mg/dL (ref 70–99)
Glucose-Capillary: 95 mg/dL (ref 70–99)

## 2022-04-27 LAB — BASIC METABOLIC PANEL
Anion gap: 9 (ref 5–15)
BUN: 12 mg/dL (ref 8–23)
CO2: 19 mmol/L — ABNORMAL LOW (ref 22–32)
Calcium: 8.5 mg/dL — ABNORMAL LOW (ref 8.9–10.3)
Chloride: 109 mmol/L (ref 98–111)
Creatinine, Ser: 0.73 mg/dL (ref 0.61–1.24)
GFR, Estimated: >60 mL/min (ref >60)
Glucose, Bld: 105 mg/dL — ABNORMAL HIGH (ref 70–99)
Potassium: 4 mmol/L (ref 3.5–5.1)
Sodium: 137 mmol/L (ref 135–145)

## 2022-04-27 LAB — PHOSPHORUS: Phosphorus: 2.6 mg/dL (ref 2.5–4.6)

## 2022-04-27 LAB — MAGNESIUM: Magnesium: 2 mg/dL (ref 1.7–2.4)

## 2022-04-27 MED ORDER — DOXYCYCLINE HYCLATE 100 MG PO TABS: 100.0000 mg | ORAL_TABLET | Freq: Two times a day (BID) | ORAL | 0 refills | Status: AC

## 2022-04-27 MED ORDER — JEVITY 1.2 CAL PO LIQD: 1000.0000 mL | ORAL | Status: DC

## 2022-04-27 MED ORDER — FREE WATER: 120.0000 mL | Freq: Four times a day (QID) | Status: DC

## 2022-04-27 MED ORDER — ACETAMINOPHEN 160 MG/5ML PO SOLN: 650.0000 mg | Freq: Four times a day (QID) | ORAL | Status: DC | PRN

## 2022-04-27 MED ORDER — AMOXICILLIN-POT CLAVULANATE 400-57 MG/5ML PO SUSR: 800.0000 mg | Freq: Two times a day (BID) | ORAL | 0 refills | Status: AC

## 2022-04-27 MED ORDER — FREE WATER
120.0000 mL | Freq: Four times a day (QID) | Status: DC
  Administered 2022-04-27: 120 mL

## 2022-04-27 NOTE — Discharge Instructions (Addendum)

## 2022-04-27 NOTE — Discharge Summary (Addendum)
Physician Discharge Summary  [name] [id number] DOB: 1945/04/10  PCP: Pcp, No  Admitted from: Home with hospice Discharged to: Home with hospice  Admit date: 04/24/2022 Discharge date: 04/27/2022  Recommendations for Outpatient Follow-up:    Follow-up Information     Dr. Gilford Rile with [facility] Follow up.   Why: Home hospice to continue follow-up.                 Home Health: None    Equipment/Devices: None    Discharge Condition: Remained stable but overall prognosis extremely poor and guarded.   Code Status: Full Code Diet recommendation: NPO.  Tube feeding.    Discharge Diagnoses:  Principal Problem:   Cellulitis of left toe   Brief Summary: 77 year old male from home with home hospice, medical history significant for what appears to be bedbound/wheelchair mobile with family, with severe contractures, nonverbal at baseline, PEG tube and Foley catheter dependent, prostate cancer, dementia, failure to thrive, prior recurrent falls, SDH, most recent hospitalization in August 2023 for acute metabolic encephalopathy, sepsis due to complicated UTI, extensive [place] discussions were had with family/daughter, presented to the [facility] ED on 04/24/2022 due to progressive left great toe infection x 2 months, recently completed a course of antibiotics by PCP but developed fever of 101 F.  Admitted for suspected left great toe osteomyelitis.  Orthopedics consulted, stated that patient is not a surgical candidate and signed off.  Palliative care consulted, family wish to pursue aggressive care.     Assessment & Plan:   Left great toe ulcer with osteomyelitis: External findings as an picture noted below.  Not much cellulitis on exam.  X-ray of left foot shows dislocation of great toe MTP joint, subcutaneous air and possible soft tissue infection in the region of the great toe MTP joint with moderate erosion of the distal aspect of the first metatarsal  suspicious for osteomyelitis.  CRP 4.9.  ESR 86.  Lactate normal.  MRSA PCR negative.  Blood cultures x 2: Negative to date.  Mild leukopenia.  Empirically on IV cefepime and vancomycin.  Orthopedics follow-up 2/10 appreciated.  They discussed in detail with patient's daughter and son and explained that patient was not a surgical candidate due to his overall medical situation, encouraged comfort measures and signed off.  However as per palliative care team input, patient and family wish to pursue aggressive care.  MRI of the foot confirms acute osteomyelitis of the first metatarsal and proximal phalanx of the great toe, soft tissue wound or ulceration underlying the plantar aspect of the first metatarsal head with surrounding cellulitis but no drainable fluid, dorsal dislocation of the first MTP joint.  It may be hard to place a PICC line for prolonged antibiotics given his severe contractures.  Discussed with infectious disease MD on-call on 04/26/2022 who agrees with no IV antibiotics and recommends doxycycline and Augmentin via PEG tube x 6 weeks.  Discussed in detail with patient's daughter at bedside and advised that the infection with any form of antibiotics is less likely to heal given his overall condition.  Even though it seems that she understands, there seem to be quite a bit of unrealistic expectations.  Has completed 2 days of IV antibiotics and 2 days of above oral antibiotics at time of discharge, continue Augmentin and doxycycline via PEG tube to complete 6 weeks course.   Dehydration with hyponatremia: Improved.  Remains at high risk for recurrence of the same.  Continue PEG tube feeds.  Tube  feeds were started at slow rate and gradually advanced which she has tolerated.  As communicated with the registered dietitian today and as per their note, discharging home on tube feeds and free water as noted below and home hospice agency can accommodate this need.   Hypoglycemia: Resolved without any  further hypoglycemic episodes over the last 24 hours.   Anemia of chronic disease: Baseline hemoglobin in the low 9 g range.  Hemoglobin is slightly dropped from 9.6-8.6, possibly hemodilutional.  Mild leukopenia.  Stable.   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  2/11: Daughter indicated that they were not satisfied with the current hospice services and wanted to speak with the social worker to switch home hospice service.  TOC consulted.  However subsequently daughter has decided to remain with the same prior home hospice company.   Body mass index is 17 kg/m/underweight: Suspected due to inadequate intake PTA.  As noted above, RD consulted, tube feeds were gradually increased to a goal of 55 mL/h and RD recommends discharging patient this regimen.   Nutritional Status Nutrition Problem: Inadequate oral intake Etiology: inability to eat Signs/Symptoms: NPO status (reliance on tube feeds via G-tube to meet nutrition needs) Interventions: Refer to RD note for recommendations     Pressure Ulcer:     Pressure Injury 04/24/22 Toe (Comment  which one) Anterior;Left (Active)  04/24/22 1600  Location: Toe (Comment  which one)  Location Orientation: Anterior;Left  Staging:   Wound Description (Comments):   Present on Admission: Yes  Dressing Type Foam - Lift dressing to assess site every shift 04/25/22 2000  This pressure injury is likely due to remaining in bed, poor nutritional status and was present on admission.    Consultants:   Orthopedics Palliative care team   Procedures:      Discharge Instructions  Discharge Instructions     Call MD for:  difficulty breathing, headache or visual disturbances   Complete by: As directed    Call MD for:  extreme fatigue   Complete by: As directed    Call MD for:  persistant dizziness or light-headedness   Complete by: As directed    Call MD for:  persistant  nausea and vomiting   Complete by: As directed    Call MD for:  redness, tenderness, or signs of infection (pain, swelling, redness, odor or green/yellow discharge around incision site)   Complete by: As directed    Call MD for:  severe uncontrolled pain   Complete by: As directed    Call MD for:  temperature >100.4   Complete by: As directed    Increase activity slowly   Complete by: As directed    No wound care   Complete by: As directed         Medication List     TAKE these medications    acetaminophen 160 MG/5ML solution Commonly known as: TYLENOL Place 20.3 mLs (650 mg total) into feeding tube every 6 (six) hours as needed for mild pain, moderate pain or fever.   albuterol (2.5 MG/3ML) 0.083% nebulizer solution Commonly known as: PROVENTIL Take 2.5 mg by nebulization every 8 (eight) hours as needed for wheezing or shortness of breath. What changed: Another medication with the same name was removed. Continue taking this medication, and follow the directions you see here.   amoxicillin-clavulanate 400-57 MG/5ML suspension Commonly known as: AUGMENTIN Place 10 mLs (800 mg total) into feeding tube 2 (two) times  daily.   doxycycline 100 MG tablet Commonly known as: VIBRA-TABS Place 1 tablet (100 mg total) into feeding tube 2 (two) times daily.   feeding supplement (JEVITY 1.2 CAL) Liqd Place 1,000 mLs into feeding tube continuous. 1,000 mL, Per Tube, at 55 mL/hr, Continuous.   free water Soln Place 120 mLs into feeding tube every 6 (six) hours.   Geri-Tussin 100 MG/5ML liquid Generic drug: guaiFENesin Place 5 mLs into feeding tube every 4 (four) hours as needed for cough or to loosen phlegm.       No Known Allergies    Procedures/Studies: MR FOOT LEFT WO CONTRAST  Result Date: 04/25/2022 CLINICAL DATA:  Left foot ulceration, osteomyelitis EXAM: MRI OF THE LEFT FOOT WITHOUT CONTRAST TECHNIQUE: Multiplanar, multisequence MR imaging of the left forefoot was  performed. No intravenous contrast was administered. COMPARISON:  X-ray 04/24/2022 FINDINGS: Technical Note: Despite efforts by the technologist and patient, motion artifact is present on today's exam and could not be eliminated. This reduces exam sensitivity and specificity. Bones/Joint/Cartilage Dorsal dislocation of the first MTP joint with the great toe at 90 degrees of dorsiflexion and laterally rotated. Extensive bone marrow edema throughout the distal aspect of the first metatarsal extending to the proximal metaphysis with intermediate to low T1 marrow signal compatible with osteomyelitis. There is erosion along the plantar aspect of the first metatarsal head. Bone marrow edema and mild T1 signal changes within the proximal phalanx of the great toe is also compatible with osteomyelitis. Remaining osseous structures of the forefoot demonstrate preserved bone marrow signal without additional site of osteomyelitis. No fracture. No additional dislocation. Ligaments Disruption of the first MTP joint capsule. Intact Lisfranc ligament. Muscles and Tendons Denervation changes of the foot musculature. FHL tendon remains intact although is laterally dislocated into the first intermetatarsal space. No tenosynovitis. Soft tissues Soft tissue wound or ulceration underlying the plantar aspect of the first metatarsal head with surrounding soft tissue edema. No drainable fluid collections. IMPRESSION: 1. Acute osteomyelitis of the first metatarsal and proximal phalanx of the great toe. 2. Soft tissue wound or ulceration underlying the plantar aspect of the first metatarsal head with surrounding cellulitis. No drainable fluid collections. 3. Dorsal dislocation of the first MTP joint. Electronically Signed   By: Davina Poke D.O.   On: 04/25/2022 14:41   DG Foot Complete Left  Result Date: 04/24/2022 CLINICAL DATA:  Ulceration of the left foot.  Infection. EXAM: LEFT FOOT - COMPLETE 3+ VIEW COMPARISON:  Left tibia and  fibula radiographs 01/26/2020, left foot radiographs 05/07/2014 FINDINGS: There is dislocation of the great toe metatarsophalangeal joint with the phalanges directed dorsally, posteriorly, and laterally with respect to the distal aspect of the metatarsal. There appears be moderate erosion of the distal aspect of the first metatarsal. There are subcutaneous lucencies suggesting air and possible soft tissue infection in the region of the great toe metatarsophalangeal joint. Mild dorsal talonavicular degenerative spurring. No acute fracture. IMPRESSION: 1. Dislocation of the great toe metatarsophalangeal joint. The phalanges are directed dorsally and laterally, predominantly perpendicular to the expected direction of the metatarsophalangeal joint. 2. Subcutaneous lucencies suggesting air and possible soft tissue infection in the region of the great toe metatarsophalangeal joint. Moderate erosion of the distal aspect of the first metatarsal, suspicious for osteomyelitis. Electronically Signed   By: Yvonne Kendall M.D.   On: 04/24/2022 12:29      Subjective: Patient is nonverbal.  Son at bedside with very limited [place].  No other family at bedside.  Per  nursing, no acute issues and tolerating tube feeds.  Tube feeds were up to 45 mL/h when I visited patient.  Discharge Exam:  Vitals:   04/26/22 2201 04/27/22 0442 04/27/22 0500 04/27/22 1215  BP: 122/74 (!) 119/91  (!) 89/64  Pulse: 77 82  81  Resp: 15 15  19  $ Temp: 98.3 F (36.8 C) 98.2 F (36.8 C)  98 F (36.7 C)  TempSrc: Oral Oral  Oral  SpO2: 100% 95%  99%  Weight:   41.7 kg   Height:        General exam: Elderly male, small built, frail, chronically ill looking and cachectic, severe contractures of upper and lower extremities, eyes open and may be tracking but unsure, edentulous, nonverbal and noncommunicative.  Unchanged.  Does not look in any discomfort or pain.  Chronic mouth breathing. Respiratory system: Poor inspiratory effort.  Clear  to auscultation.  No increased work of breathing. Cardiovascular system: S1 and S2 heard, RRR. No JVD, murmurs, rubs, gallops or clicks. No pedal edema. Gastrointestinal system: Abdomen is nondistended, soft and nontender. No organomegaly or masses felt. Normal bowel sounds heard.  Has PEG tube, site intact without acute findings. Central nervous system: Mental status as noted above.  No focal neurological deficits appreciated. Extremities: Extreme contractures of all 4 extremities as noted in picture below from 2/10. Skin: 2/10: Ulceration approximately over the ball of the left great toe with no surrounding erythema, tenderness or fluctuance.  Currently without drainage although orthopedics appreciated bloody drainage yesterday.  Deformed left first great toe.  Details as in picture below from 2/10.  2/11: Dressing to left big toe clean and dry, did not remove dressing to examine Psychiatry: Judgement and insight impaired. Mood & affect cannot be assessed.  04/25/2022   04/25/2022: Left foot   04/24/2022   04/24/2022     The results of significant diagnostics from this hospitalization (including imaging, microbiology, ancillary and laboratory) are listed below for reference.     Microbiology: Recent Results (from the past 240 hour(s))  Culture, blood (routine x 2)     Status: None (Preliminary result)   Collection Time: 04/24/22 12:35 PM   Specimen: BLOOD LEFT HAND  Result Value Ref Range Status   Specimen Description   Final    BLOOD LEFT HAND Performed at [facility], [place] [address], [place], [place] [address]    Special Requests   Final    BOTTLES DRAWN AEROBIC AND ANAEROBIC Blood Culture results may not be optimal due to an inadequate volume of blood received in culture bottles Performed at [place] [address], [place], [place] [address]    Culture   Final    NO GROWTH 3 DAYS Performed at [facility], [place] [address] [place], [place] [address]    Report Status PENDING  Incomplete  Culture, blood (routine x 2)     Status: None (Preliminary result)   Collection Time: 04/24/22  3:40 PM   Specimen: BLOOD  Result Value Ref Range Status   Specimen Description   Final    BLOOD LEFT ANTECUBITAL Performed at [place] [address] [place], [place] [address]    Special Requests   Final    BOTTLES DRAWN AEROBIC AND ANAEROBIC Blood Culture results may not be optimal due to an inadequate volume of blood received in culture bottles Performed at [place] [address], [place], [place] [address]    Culture  Final    NO GROWTH 3 DAYS Performed at [facility], [place] [address] [place], [place] [address]    Report Status PENDING  Incomplete  MRSA Next Gen by PCR, Nasal     Status: None   Collection Time: 04/24/22  7:06 PM   Specimen: Nasal Mucosa; Nasal Swab  Result Value Ref Range Status   MRSA by PCR Next Gen NOT DETECTED NOT DETECTED Final    Comment: (NOTE) The GeneXpert MRSA Assay (FDA approved for NASAL specimens only), is one component of a comprehensive MRSA colonization surveillance program. It is not intended to diagnose MRSA infection nor to guide or monitor treatment for MRSA infections. Test performance is not FDA approved in patients less than 9 years old. Performed at [facility], [place] [address] [place], [place] [address]      Labs: CBC: Recent Labs  Lab 04/24/22 1211 04/25/22 0531 04/26/22 0451  WBC 5.8 3.6* 3.5*  NEUTROABS 4.1  --   --   HGB 9.6* 8.6* 9.1*  HCT 28.8* 26.2* 27.3*  MCV 97.6 99.2 98.2  PLT 411* 370 [id number]    Basic Metabolic Panel: Recent Labs  Lab 04/24/22 1211 04/25/22 0531 04/25/22 1707 04/26/22 0451 04/26/22 1701 04/27/22 0422  NA 128* 132*  --  132*  --  137  K 4.5 3.9  --  3.7  --  4.0  CL 96* 105  --  105  --  109  CO2 22 19*  --  19*  --  19*  GLUCOSE 92 81  --  265*  --   105*  BUN 22 17  --  19  --  12  CREATININE 0.84 0.64  --  0.89  --  0.73  CALCIUM 8.6* 8.1*  --  8.3*  --  8.5*  MG  --  2.0 2.1 2.1 2.1 2.0  PHOS  --  3.3 3.3 3.2 2.9 2.6    Liver Function Tests: Recent Labs  Lab 04/24/22 1211 04/25/22 0531  AST 33 22  ALT 22 17  ALKPHOS 60 51  BILITOT 0.5 0.8  PROT 7.2 5.9*  ALBUMIN 3.0* 2.5*    CBG: Recent Labs  Lab 04/26/22 2047 04/27/22 0000 04/27/22 0439 04/27/22 0745 04/27/22 1212  GLUCAP 112* 93 95 114* 124*    Hgb A1c Recent Labs    04/25/22 0531  HGBA1C 5.0     Time coordinating discharge: 25 minutes  SIGNED:  Vernell Leep, MD,  FACP, [place], [facility], [facility], [place]     To contact the attending provider between 7A-7P or the covering provider during after hours 7P-7A, please log into the web site [email or web address] and access using universal [place] password for that web site. If you do not have the password, please call the hospital operator.

## 2022-04-27 NOTE — Plan of Care (Signed)

## 2022-04-27 NOTE — Progress Notes (Signed)
Nutrition Follow-up  DOCUMENTATION CODES:   Underweight  INTERVENTION:   -Recommend patient continue on continuous feeds of Jevity 1.2 @ 45 ml/hr, advance another 10 ml to goal rate of 55 ml/hr.  -Provides 1584 kcal, 73 grams of protein, 1069 mL H2O daily  -Free water recommendation: 120 ml every 6 hours (480 ml)  NUTRITION DIAGNOSIS:   Inadequate oral intake related to inability to eat as evidenced by NPO status (reliance on tube feeds via G-tube to meet nutrition needs).  GOAL:   Patient will meet greater than or equal to 90% of their needs  MONITOR:   Weight trends, Labs, TF tolerance, Skin, I & O's  ASSESSMENT:   77 year old male with PMHx of bedbound status with contractures, nonverbal at baseline, PEG tube and Foley catheter dependent, prostate cancer, dementia, failure to thrive, prior recurrent falls, SDH who is admitted from home with hospice with left great toe ulcer with osteomyelitis, dehydration with hyponatremia.  Patient in room, not interactive, sleeping with mouth open. Pt contracted. Pt's son at bedside but was unable to provide history. Seems most of history is provided by pt's daughter who is supposed to come to hospital today. Checked by room twice and daughter was not there. Asked RN to let RD know when daughter is here.  Visited room again, RN was providing patient care and daughter at bedside. Per daughter, pt is provided feeds by his son and he gets 4 cartons of Boost daily. Daughter states this is a lot of work for her brother and she was interested in him continuing the pump feeds. The Boost was not meeting pt's needs. Will need to continue Jevity 1.2 to meet needs.  Admission weight: 89 lbs Current weight: 92 lbs  Medications reviewed.  Labs reviewed: CBGs: 93-124   NUTRITION - FOCUSED PHYSICAL EXAM:  Patient contracted and non-interactive.  Flowsheet Row Most Recent Value  Orbital Region Severe depletion  Upper Arm Region Severe depletion   [contracted]  Thoracic and Lumbar Region Severe depletion  Buccal Region Severe depletion  Temple Region Severe depletion  Clavicle Bone Region Severe depletion  Clavicle and Acromion Bone Region Severe depletion  Scapular Bone Region Severe depletion  Dorsal Hand Severe depletion  Patellar Region Severe depletion  [contracted]  Anterior Thigh Region Severe depletion  Posterior Calf Region Severe depletion  Edema (RD Assessment) None  Hair Reviewed  Eyes Unable to assess  [kept closed]  Mouth Reviewed  [wide open breathing]  Skin Reviewed  [dry]       Diet Order:   Diet Order     None       EDUCATION NEEDS:   Not appropriate for education at this time  Skin:  Skin Integrity Issues:: Other (Comment) Other: wound to left great toe  Last BM:  2/12 -type 1  Height:   Ht Readings from Last 1 Encounters:  04/24/22 5' 1"$  (1.549 m)    Weight:   Wt Readings from Last 1 Encounters:  04/27/22 41.7 kg    Ideal Body Weight:  50.9 kg  BMI:  Body mass index is 17.38 kg/m.  Estimated Nutritional Needs:   Kcal:  1450-1650  Protein:  70-90 grams  Fluid:  1.4-1.6 L/day or per MD   Clayton Bibles, MS, RD, LDN Inpatient Clinical Dietitian Contact information available via Amion

## 2022-04-27 NOTE — TOC Transition Note (Addendum)
Transition of Care [facility]) - CM/SW Discharge Note   Patient Details  Name: [name] MRN: [id number] Date of Birth: 1946/01/28  Transition [facility] CM/SW Contact:  Dessa Phi, RN Phone Number: 04/27/2022, 2:50 PM   Clinical Narrative:Spoke to [facility] rep Olivia Mackie informed me tht [facility] is the payer source therefore they will discuss their process w/dtr [name] about their criteria since  medically stable for d/c.Supv aware. [facility] forms in shadow chart if d/c today.      Final next level of care: Home w Hospice Care Barriers to Discharge: No Barriers Identified   Patient Goals and CMS Choice      Discharge Placement                         Discharge Plan and Services Additional resources added to the After Visit Summary for     Discharge Planning Services: CM Consult Post Acute Care Choice: Resumption of Svcs/PTA Provider [facility] w/ [facility])                               Social Determinants of Health (SDOH) Interventions SDOH Screenings   Tobacco Use: Medium Risk (04/26/2022)     Readmission Risk Interventions     No data to display

## 2022-04-27 NOTE — Progress Notes (Signed)
[facility] [facility]) Hospital Liaison Note   Patient no longer meets GIP level of care.  Due to now being routine level of care, the patient is to cover room and board charge at the hospital. Daughter has been made aware.   We will continue to follow patient for discharge disposition.    Zigmund Gottron, RN [facility] Liaison [phone]

## 2022-04-27 NOTE — TOC Progression Note (Addendum)
Transition of Care [facility]) - Progression Note    Patient Details  Name: [name] MRN: [id number] Date of Birth: 1945/12/06  Transition of Care [facility]) CM/SW Contact  Eliud Polo, Juliann Pulse, RN Phone Number: 04/27/2022, 9:30 AM  Clinical Narrative:Noted per prior [facility] ntoes-d/c plan home w/hospice-alternative home hospice agency-spoke to dtr [name](Thao) about d/c plans-d/c home w/hospice-she will choose home hospice agency-await choice-she is on her way to hospital today.List in rm.MD updated.   -12:54p-spoke to [name](dtr) again-she has decided to choose [place] aware-will need w/c. [name] informed of likely d/c once determined the home hospice agency. Nutrition to provide TF diet order. Will arrange PTAR @ d/c. -4:22p-Spoke to dtr Thao-she agreed to safe d/c to home by PTAR.Thao(dtr) will f/u with [facility]-rep will f/u their process after d/c. No further CM needs. PTAR called.    Expected Discharge Plan: Home w Hospice Care Barriers to Discharge: Continued Medical Work up  Expected Discharge Plan and Services   Discharge Planning Services: CM Consult Post Acute Care Choice: Resumption of Svcs/PTA Provider [facility] w/ [facility]) Living arrangements for the past 2 months: Single Family Home                                       Social Determinants of Health (SDOH) Interventions SDOH Screenings   Tobacco Use: Medium Risk (04/26/2022)    Readmission Risk Interventions     No data to display

## 2022-04-28 NOTE — Progress Notes (Signed)
After d/c note: Received call from [employer] in f/u to dtr Thao calling to appeal d/c.They have subitted my info to review the claim,then close the claim. Explained to [facility] rep that patient under Hospice [facility] billing-they were managing the payment @ d/c-their process was- -copied note:Patient no longer meets GIP level of care.  Due to now being routine level of care, the patient is to cover room and board charge at the hospital. Daughter has been made aware.    We will continue to follow patient for discharge disposition.    Zigmund Gottron, RN [facility] Liaison [phone]

## 2022-04-29 LAB — CULTURE, BLOOD (ROUTINE X 2)
Culture: NO GROWTH
Culture: NO GROWTH

## 2022-10-09 ENCOUNTER — Inpatient Hospital Stay (HOSPITAL_COMMUNITY)
Admission: EM | Admit: 2022-10-09 | Discharge: 2022-10-14 | DRG: 177 | Disposition: A | Discharge status: Hospice | Payer: Medicare Other | Attending: Internal Medicine | Admitting: Internal Medicine

## 2022-10-09 ENCOUNTER — Other Ambulatory Visit: Payer: Self-pay

## 2022-10-09 ENCOUNTER — Encounter (HOSPITAL_COMMUNITY): Payer: Self-pay | Admitting: Family Medicine

## 2022-10-09 ENCOUNTER — Emergency Department (HOSPITAL_COMMUNITY): Payer: Medicare Other

## 2022-10-09 DIAGNOSIS — E872 Acidosis, unspecified: Secondary | ICD-10-CM | POA: Diagnosis present

## 2022-10-09 DIAGNOSIS — R64 Cachexia: Secondary | ICD-10-CM | POA: Diagnosis not present

## 2022-10-09 DIAGNOSIS — E8809 Other disorders of plasma-protein metabolism, not elsewhere classified: Secondary | ICD-10-CM | POA: Diagnosis not present

## 2022-10-09 DIAGNOSIS — R627 Adult failure to thrive: Secondary | ICD-10-CM | POA: Diagnosis not present

## 2022-10-09 DIAGNOSIS — K449 Diaphragmatic hernia without obstruction or gangrene: Secondary | ICD-10-CM | POA: Diagnosis not present

## 2022-10-09 DIAGNOSIS — E871 Hypo-osmolality and hyponatremia: Secondary | ICD-10-CM | POA: Diagnosis not present

## 2022-10-09 DIAGNOSIS — Z681 Body mass index (BMI) 19 or less, adult: Secondary | ICD-10-CM | POA: Diagnosis not present

## 2022-10-09 DIAGNOSIS — K9423 Gastrostomy malfunction: Secondary | ICD-10-CM | POA: Diagnosis not present

## 2022-10-09 DIAGNOSIS — T85598A Other mechanical complication of other gastrointestinal prosthetic devices, implants and grafts, initial encounter: Principal | ICD-10-CM

## 2022-10-09 DIAGNOSIS — Z87891 Personal history of nicotine dependence: Secondary | ICD-10-CM

## 2022-10-09 DIAGNOSIS — Z86010 Personal history of colonic polyps: Secondary | ICD-10-CM

## 2022-10-09 DIAGNOSIS — E860 Dehydration: Secondary | ICD-10-CM | POA: Diagnosis not present

## 2022-10-09 DIAGNOSIS — D649 Anemia, unspecified: Secondary | ICD-10-CM | POA: Diagnosis not present

## 2022-10-09 DIAGNOSIS — N3000 Acute cystitis without hematuria: Secondary | ICD-10-CM

## 2022-10-09 DIAGNOSIS — F32A Depression, unspecified: Secondary | ICD-10-CM | POA: Diagnosis not present

## 2022-10-09 DIAGNOSIS — Z66 Do not resuscitate: Secondary | ICD-10-CM | POA: Diagnosis present

## 2022-10-09 DIAGNOSIS — N3090 Cystitis, unspecified without hematuria: Secondary | ICD-10-CM

## 2022-10-09 DIAGNOSIS — N390 Urinary tract infection, site not specified: Secondary | ICD-10-CM | POA: Diagnosis present

## 2022-10-09 DIAGNOSIS — Z7401 Bed confinement status: Secondary | ICD-10-CM | POA: Diagnosis not present

## 2022-10-09 DIAGNOSIS — E861 Hypovolemia: Secondary | ICD-10-CM | POA: Diagnosis not present

## 2022-10-09 DIAGNOSIS — Z8619 Personal history of other infectious and parasitic diseases: Secondary | ICD-10-CM | POA: Diagnosis not present

## 2022-10-09 DIAGNOSIS — F03B0 Unspecified dementia, moderate, without behavioral disturbance, psychotic disturbance, mood disturbance, and anxiety: Secondary | ICD-10-CM | POA: Diagnosis present

## 2022-10-09 DIAGNOSIS — E43 Unspecified severe protein-calorie malnutrition: Secondary | ICD-10-CM | POA: Diagnosis present

## 2022-10-09 DIAGNOSIS — J690 Pneumonitis due to inhalation of food and vomit: Secondary | ICD-10-CM | POA: Diagnosis not present

## 2022-10-09 DIAGNOSIS — Z8546 Personal history of malignant neoplasm of prostate: Secondary | ICD-10-CM | POA: Diagnosis not present

## 2022-10-09 DIAGNOSIS — R5381 Other malaise: Secondary | ICD-10-CM

## 2022-10-09 DIAGNOSIS — E162 Hypoglycemia, unspecified: Secondary | ICD-10-CM | POA: Diagnosis not present

## 2022-10-09 DIAGNOSIS — F419 Anxiety disorder, unspecified: Secondary | ICD-10-CM | POA: Diagnosis present

## 2022-10-09 DIAGNOSIS — Z515 Encounter for palliative care: Secondary | ICD-10-CM

## 2022-10-09 LAB — URINALYSIS, ROUTINE W REFLEX MICROSCOPIC
Bilirubin Urine: NEGATIVE
Glucose, UA: NEGATIVE mg/dL
Hgb urine dipstick: NEGATIVE
Ketones, ur: NEGATIVE mg/dL
Nitrite: POSITIVE — AB
Protein, ur: NEGATIVE mg/dL
Specific Gravity, Urine: 1.01 (ref 1.005–1.030)
pH: 7 (ref 5.0–8.0)

## 2022-10-09 LAB — COMPREHENSIVE METABOLIC PANEL
ALT: 29 U/L (ref 0–44)
AST: 29 U/L (ref 15–41)
Albumin: 2.6 g/dL — ABNORMAL LOW (ref 3.5–5.0)
Alkaline Phosphatase: 53 U/L (ref 38–126)
Anion gap: 6 (ref 5–15)
BUN: 17 mg/dL (ref 8–23)
CO2: 19 mmol/L — ABNORMAL LOW (ref 22–32)
Calcium: 7.5 mg/dL — ABNORMAL LOW (ref 8.9–10.3)
Chloride: 100 mmol/L (ref 98–111)
Creatinine, Ser: 0.69 mg/dL (ref 0.61–1.24)
GFR, Estimated: >60 mL/min (ref >60)
Glucose, Bld: 89 mg/dL (ref 70–99)
Potassium: 4.3 mmol/L (ref 3.5–5.1)
Sodium: 125 mmol/L — ABNORMAL LOW (ref 135–145)
Total Bilirubin: 0.7 mg/dL (ref 0.3–1.2)
Total Protein: 5.7 g/dL — ABNORMAL LOW (ref 6.5–8.1)

## 2022-10-09 LAB — I-STAT CG4 LACTIC ACID, ED: Lactic Acid, Venous: 0.4 mmol/L — ABNORMAL LOW (ref 0.5–1.9)

## 2022-10-09 LAB — CBC WITH DIFFERENTIAL/PLATELET
Abs Immature Granulocytes: 0.02 10*3/uL (ref 0.00–0.07)
Basophils Absolute: 0 10*3/uL (ref 0.0–0.1)
Basophils Relative: 1 %
Eosinophils Absolute: 0.1 10*3/uL (ref 0.0–0.5)
Eosinophils Relative: 1 %
HCT: 26.3 % — ABNORMAL LOW (ref 39.0–52.0)
Hemoglobin: 9 g/dL — ABNORMAL LOW (ref 13.0–17.0)
Immature Granulocytes: 0 %
Lymphocytes Relative: 26 %
Lymphs Abs: 1.4 10*3/uL (ref 0.7–4.0)
MCH: 32.7 pg (ref 26.0–34.0)
MCHC: 34.2 g/dL (ref 30.0–36.0)
MCV: 95.6 fL (ref 80.0–100.0)
Monocytes Absolute: 0.3 10*3/uL (ref 0.1–1.0)
Monocytes Relative: 5 %
Neutro Abs: 3.7 10*3/uL (ref 1.7–7.7)
Neutrophils Relative %: 67 %
Platelets: 299 10*3/uL (ref 150–400)
RBC: 2.75 MIL/uL — ABNORMAL LOW (ref 4.22–5.81)
RDW: 12.4 % (ref 11.5–15.5)
WBC: 5.5 10*3/uL (ref 4.0–10.5)
nRBC: 0 % (ref 0.0–0.2)

## 2022-10-09 LAB — LIPASE, BLOOD: Lipase: 50 U/L (ref 11–51)

## 2022-10-09 MED ORDER — ACETAMINOPHEN 650 MG RE SUPP: 650.0000 mg | Freq: Four times a day (QID) | RECTAL | Status: DC | PRN

## 2022-10-09 MED ORDER — SODIUM CHLORIDE 0.9 % IV SOLN
1.0000 g | Freq: Once | INTRAVENOUS | Status: AC
  Administered 2022-10-09: 1 g via INTRAVENOUS
  Filled 2022-10-09: qty 10

## 2022-10-09 MED ORDER — SODIUM CHLORIDE 0.9 % IV SOLN: INTRAVENOUS | Status: AC

## 2022-10-09 MED ORDER — MORPHINE SULFATE (PF) 2 MG/ML IV SOLN
2.0000 mg | Freq: Once | INTRAVENOUS | Status: AC
  Administered 2022-10-09: 2 mg via INTRAVENOUS
  Filled 2022-10-09: qty 1

## 2022-10-09 MED ORDER — SODIUM CHLORIDE 0.9 % IV BOLUS
500.0000 mL | Freq: Once | INTRAVENOUS | Status: AC
  Administered 2022-10-09: 500 mL via INTRAVENOUS

## 2022-10-09 MED ORDER — ONDANSETRON HCL 4 MG/2ML IJ SOLN: 4.0000 mg | Freq: Four times a day (QID) | INTRAMUSCULAR | Status: DC | PRN

## 2022-10-09 MED ORDER — FENTANYL CITRATE PF 50 MCG/ML IJ SOSY
12.5000 ug | PREFILLED_SYRINGE | INTRAMUSCULAR | Status: DC | PRN
  Administered 2022-10-10: 50 ug via INTRAVENOUS
  Administered 2022-10-10: 12.5 ug via INTRAVENOUS
  Administered 2022-10-10 – 2022-10-13 (×4): 50 ug via INTRAVENOUS
  Filled 2022-10-09 (×6): qty 1

## 2022-10-09 MED ORDER — SODIUM CHLORIDE 0.9 % IV SOLN
1.0000 g | Freq: Three times a day (TID) | INTRAVENOUS | Status: DC
  Filled 2022-10-09 (×2): qty 20

## 2022-10-09 MED ORDER — SODIUM CHLORIDE 0.9 % IV SOLN: 500.0000 mg | Freq: Once | INTRAVENOUS | Status: DC

## 2022-10-09 MED ORDER — ONDANSETRON HCL 4 MG PO TABS: 4.0000 mg | ORAL_TABLET | Freq: Four times a day (QID) | ORAL | Status: DC | PRN

## 2022-10-09 MED ORDER — ACETAMINOPHEN 325 MG PO TABS: 650.0000 mg | ORAL_TABLET | Freq: Four times a day (QID) | ORAL | Status: DC | PRN

## 2022-10-09 MED ORDER — ENOXAPARIN SODIUM 40 MG/0.4ML IJ SOSY
40.0000 mg | PREFILLED_SYRINGE | Freq: Every day | INTRAMUSCULAR | Status: DC
  Administered 2022-10-10: 40 mg via SUBCUTANEOUS
  Filled 2022-10-09: qty 0.4

## 2022-10-09 MED ORDER — SODIUM CHLORIDE 0.9 % IV SOLN
1.0000 g | Freq: Two times a day (BID) | INTRAVENOUS | Status: DC
  Administered 2022-10-10 (×2): 1 g via INTRAVENOUS
  Filled 2022-10-09 (×2): qty 20

## 2022-10-09 MED ORDER — SODIUM CHLORIDE 0.9 % IV BOLUS
1000.0000 mL | Freq: Once | INTRAVENOUS | Status: AC
  Administered 2022-10-09: 1000 mL via INTRAVENOUS

## 2022-10-09 MED ORDER — IOHEXOL 350 MG/ML SOLN
60.0000 mL | Freq: Once | INTRAVENOUS | Status: AC | PRN
  Administered 2022-10-09: 60 mL via INTRAVENOUS

## 2022-10-09 NOTE — Progress Notes (Signed)
Pharmacy Antibiotic Note  [name] is a 77 y.o. male for which pharmacy has been consulted for meropenem dosing for  UTI w/ hx of ESBL . Patient here from home hospice with feeding tube problem.  SCr 0.69 (CrCl ~40 ml/min using stated weight) WBC 5.5; LA 0.4; T 98.6; HR 94; RR 20  Plan: Meropenem 1g q12h Monitor WBC, fever, renal function, cultures De-escalate when able     Temp (24hrs), Avg:98.5 F (36.9 C), Min:98.4 F (36.9 C), Max:98.6 F (37 C)  Recent Labs  Lab 10/09/22 1422 10/09/22 2041  WBC 5.5  --   CREATININE 0.69  --   LATICACIDVEN  --  0.4*    CrCl cannot be calculated (Unknown ideal weight.).    No Known Allergies  Microbiology results: Pending  Thank you for allowing pharmacy to be a part of this patient's care.  Delmar Landau, PharmD, BCPS 10/09/2022 10:42 PM ED Clinical Pharmacist -  [phone]

## 2022-10-09 NOTE — H&P (Signed)
History and Physical    [name] ZOX:[id number] DOB: 1945-04-02 DOA: 10/09/2022  PCP: Pcp, No   Patient coming from: Home   Chief Complaint: Feeding tube not working   HPI: [name] is a49 y.o. male with medical history significant for chronic subdural hematoma and dementia who is nonverbal, bedbound, and severely contracted at baseline and now presents for evaluation of feeding tube not working.  Patient's family have noticed leakage around the entry site of his feeding tube over the past 3 days and his son notes he was unable to flush the tube last night or today.  Family notes that the patient has appeared to be in pain at times, grabbing at his lower abdomen.  ED Course: Upon arrival to the ED, patient is found to be afebrile and slightly tachycardic with initial blood pressure 100/70.  Labs are most notable for sodium 125, albumin 2.6, hemoglobin 9.0, normal WBC, and normal lactic acid.  Urinalysis notable for bacteriuria, pyuria, and positive nitrites.  Urine culture was collected in the ED and the patient was given 1.5 L of NS, morphine, and Rocephin.  Review of Systems:  ROS limited by patient's clinical condition.  Past Medical History:  Diagnosis Date   Adenomatous polyp of colon 06/2010   4 polyps removed at colonoscopy by Dr Marina Goodell   Anxiety    Chronic headaches    Depression    Diverticulosis of colon 08/2010   Internal hemorrhoids 08/2010   Prostate cancer [facility])    receiving radiation treatment   Subdural hematoma, chronic (HCC)    Tuberculosis     Past Surgical History:  Procedure Laterality Date   BIOPSY  01/29/2020   Procedure: BIOPSY;  Surgeon: Hilarie Fredrickson, MD;  Location: [facility] ENDOSCOPY;  Service: Endoscopy;;   COLONOSCOPY WITH PROPOFOL N/A 01/29/2020   Procedure: COLONOSCOPY WITH PROPOFOL;  Surgeon: Hilarie Fredrickson, MD;  Location: [facility] ENDOSCOPY;  Service: Endoscopy;  Laterality: N/A;   ESOPHAGOGASTRODUODENOSCOPY (EGD) WITH PROPOFOL N/A 01/29/2020   Procedure:  ESOPHAGOGASTRODUODENOSCOPY (EGD) WITH PROPOFOL;  Surgeon: Hilarie Fredrickson, MD;  Location: [facility] ENDOSCOPY;  Service: Endoscopy;  Laterality: N/A;   FLEXIBLE SIGMOIDOSCOPY  03/29/2012   Procedure: FLEXIBLE SIGMOIDOSCOPY;  Surgeon: Louis Meckel, MD;  [facility] ENDOSCOPY;  Service: Endoscopy;  Laterality: N/A;  possibly may do colonoscopy but prep is only for a flex   FLEXIBLE SIGMOIDOSCOPY  03/31/2012   Procedure: FLEXIBLE SIGMOIDOSCOPY;  Surgeon: Louis Meckel, MD;  Location[facility] ENDOSCOPY;  Service: Endoscopy;  Laterality: N/A;   HEMORRHOID BANDING  03/31/2012   Procedure: HEMORRHOID BANDING;  Surgeon: Louis Meckel, MD;  Location: [facility] ENDOSCOPY;  Service: Endoscopy;  Laterality: N/A;   HOT HEMOSTASIS N/A 01/29/2020   Procedure: HOT HEMOSTASIS (ARGON PLASMA COAGULATION/BICAP);  Surgeon: Hilarie Fredrickson, MD;  Location: [facility] ENDOSCOPY;  Service: Endoscopy;  Laterality: N/A;   MULTIPLE EXTRACTIONS WITH ALVEOLOPLASTY N/A 08/08/2013   Procedure: MULTIPLE EXTRACTION OF TEETH #1, 2, 3, 6, 7, 9, 11, 15, 16, 17, 20, 21, 22, 23, 24, 26, 29, 30, 32 WITH ALVEOLOPLASTY AND REMOVAL BUCCAL EXOSTOSIS LEFT MAXILLA;  Surgeon: Georgia Lopes, DDS;  Location: [facility] OR;  Service: Oral Surgery;  Laterality: N/A;   shrapnel removal     skull during [name] War    Social History:   reports that he quit smoking about 30 years ago. His smoking use included cigarettes. He started smoking about 35 years ago. He has a 1.3 pack-year smoking history. He has never used smokeless tobacco.  He reports that he does not drink alcohol and does not use drugs.  No Known Allergies  History reviewed. No pertinent family history.   Prior to Admission medications   Medication Sig Start Date End Date Taking? Authorizing Provider  acetaminophen (TYLENOL) 160 MG/5ML solution Place 20.3 mLs (650 mg total) into feeding tube every 6 (six) hours as needed for mild pain, moderate pain or fever. 04/27/22   Hongalgi, Maximino Greenland, MD  albuterol (PROVENTIL)  (2.5 MG/3ML) 0.083% nebulizer solution Take 2.5 mg by nebulization every 8 (eight) hours as needed for wheezing or shortness of breath. Patient not taking: Reported on 04/24/2022    [provider]  GERI-TUSSIN 100 MG/5ML liquid Place 5 mLs into feeding tube every 4 (four) hours as needed for cough or to loosen phlegm. 04/21/22   [provider]  Nutritional Supplements (FEEDING SUPPLEMENT, JEVITY 1.2 CAL,) LIQD Place 1,000 mLs into feeding tube continuous. 1,000 mL, Per Tube, at 55 mL/hr, Continuous. 04/27/22   Hongalgi, Maximino Greenland, MD  Water For Irrigation, Sterile (FREE WATER) SOLN Place 120 mLs into feeding tube every 6 (six) hours. 04/27/22   Elease Etienne, MD    Physical Exam: Vitals:   10/09/22 1805 10/09/22 1812 10/09/22 1900 10/09/22 2115  BP: (!) 86/58  (!) 78/64 (!) 80/67  Pulse: 91  (!) 106 94  Resp: (!) 22  (!) 32 20  Temp:  98.6 F (37 C)    TempSrc:      SpO2: 95%  95% 95%     Constitutional: NAD, cachectic, contracted   Eyes: PERTLA, lids and conjunctivae normal ENMT: Mucous membranes are dry. Posterior pharynx clear of any exudate or lesions.   Neck: supple, no masses  Respiratory: no wheezing, no crackles. No accessory muscle use.  Cardiovascular: S1 & S2 heard, regular rate and rhythm. No extremity edema.   Abdomen: No distension. Bowel sounds active.  Musculoskeletal: no clubbing / cyanosis. No joint deformity upper and lower extremities.   Skin: no significant rashes, lesions, ulcers. Warm, dry, well-perfused. Neurologic: Awake but not verbal. Severely contracted.    Labs and Imaging on Admission: I have personally reviewed following labs and imaging studies  CBC: Recent Labs  Lab 10/09/22 1422  WBC 5.5  NEUTROABS 3.7  HGB 9.0*  HCT 26.3*  MCV 95.6  PLT 299   Basic Metabolic Panel: Recent Labs  Lab 10/09/22 1422  NA 125*  K 4.3  CL 100  CO2 19*  GLUCOSE 89  BUN 17  CREATININE 0.69  CALCIUM 7.5*   GFR: CrCl cannot be  calculated (Unknown ideal weight.). Liver Function Tests: Recent Labs  Lab 10/09/22 1422  AST 29  ALT 29  ALKPHOS 53  BILITOT 0.7  PROT 5.7*  ALBUMIN 2.6*   Recent Labs  Lab 10/09/22 1422  LIPASE 50   No results for input(s): "AMMONIA" in the last 168 hours. Coagulation Profile: No results for input(s): "INR", "PROTIME" in the last 168 hours. Cardiac Enzymes: No results for input(s): "CKTOTAL", "CKMB", "CKMBINDEX", "TROPONINI" in the last 168 hours. BNP (last 3 results) No results for input(s): "PROBNP" in the last 8760 hours. HbA1C: No results for input(s): "HGBA1C" in the last 72 hours. CBG: No results for input(s): "GLUCAP" in the last 168 hours. Lipid Profile: No results for input(s): "CHOL", "HDL", "LDLCALC", "TRIG", "CHOLHDL", "LDLDIRECT" in the last 72 hours. Thyroid Function Tests: No results for input(s): "TSH", "T4TOTAL", "FREET4", "T3FREE", "THYROIDAB" in the last 72 hours. Anemia Panel: No results for input(s): "  VITAMINB12", "FOLATE", "FERRITIN", "TIBC", "IRON", "RETICCTPCT" in the last 72 hours. Urine analysis:    Component Value Date/Time   COLORURINE YELLOW 10/09/2022 1422   APPEARANCEUR HAZY (A) 10/09/2022 1422   LABSPEC 1.010 10/09/2022 1422   PHURINE 7.0 10/09/2022 1422   GLUCOSEU NEGATIVE 10/09/2022 1422   HGBUR NEGATIVE 10/09/2022 1422   HGBUR negative 05/15/2010 1129   BILIRUBINUR NEGATIVE 10/09/2022 1422   BILIRUBINUR negative 11/18/2017 1115   KETONESUR NEGATIVE 10/09/2022 1422   PROTEINUR NEGATIVE 10/09/2022 1422   UROBILINOGEN 0.2 11/18/2017 1115   UROBILINOGEN 0.2 05/06/2014 1648   NITRITE POSITIVE (A) 10/09/2022 1422   LEUKOCYTESUR LARGE (A) 10/09/2022 1422   Sepsis Labs: @LABRCNTIP (procalcitonin:4,lacticidven:4) )No results found for this or any previous visit (from the past 240 hour(s)).   Radiological Exams on Admission: CT ABDOMEN PELVIS W CONTRAST  Result Date: 10/09/2022 CLINICAL DATA:  Leaking feeding tube abdomen pain  EXAM: CT ABDOMEN AND PELVIS WITH CONTRAST TECHNIQUE: Multidetector CT imaging of the abdomen and pelvis was performed using the standard protocol following bolus administration of intravenous contrast. RADIATION DOSE REDUCTION: This exam was performed according to the departmental dose-optimization program which includes automated exposure control, adjustment of the mA and/or kV according to patient size and/or use of iterative reconstruction technique. CONTRAST:  60mL OMNIPAQUE IOHEXOL 350 MG/ML SOLN COMPARISON:  CT 07/04/2021 FINDINGS: Lower chest: Lung bases demonstrate centrilobular density in the right middle and lower lobes. There is mucous plugging or debris within right lower lobe bronchi. No consolidation or effusion. Small hiatal hernia Hepatobiliary: Contracted gallbladder. No calcified stone or biliary dilatation. Subcentimeter hypodensity within the central liver too small to further characterize. Pancreas: No inflammation. Mild diffuse prominence of pancreatic duct measuring up to 4 mm. Spleen: Normal in size without focal abnormality. Adrenals/Urinary Tract: Adrenal glands are within normal limits. Bilateral renal cysts for which no imaging follow-up is recommended. There is no hydronephrosis. Catheter within the bladder. Diffuse bladder wall thickening. Bladder is decompressed Stomach/Bowel: Stomach nondistended. Gastrostomy tube balloon within the body of the stomach. Some skin thickening and loss of subcutaneous soft tissues at the stoma. No rim enhancing fluid collections to suggest an abscess. No obstruction. Decompressed small bowel. No acute bowel wall thickening. Vascular/Lymphatic: Nonaneurysmal aorta. Mild atherosclerosis. No aneurysm. Reproductive: Negative for mass. Metallic clips in the region of the prostate. Other: Negative for pelvic effusion or free air. Musculoskeletal: Scoliosis and degenerative changes of the spine. No acute osseous abnormality. IMPRESSION: 1. Gastrostomy tube  balloon within the body of the stomach. Some skin thickening and loss of subcutaneous soft tissues at the stoma, correlate with direct inspection. No rim enhancing fluid collections to suggest an abscess. 2. Findings suggesting infectious or inflammatory bronchiolitis versus aspiration at the right lower lobe and right middle lobe. 3. Diffuse bladder wall thickening, question cystitis. 4. Mild diffuse prominence of pancreatic duct measuring up to 4 mm. No obvious mass. No inflammation 5. Small hiatal hernia. Electronically Signed   By: Jasmine Pang M.D.   On: 10/09/2022 20:14     Assessment/Plan  1. Gastrostomy tube malfunction  - Pt is dependent on feeding tube which had leakage around it for 3 days and could not be flushed last night at home  - ED personnel unable to get tube to function  - Consult IR, continue IVF hydration for now    2. UTI  - Hx of ESBL in urine  - Urine cultured in ED, will treat with meropenem for now, follow culture    3. Hyponatremia  -  Serum sodium is 125 on admission in setting of hypovolemia  - Continue IVF hydration and repeat chem panel in am     DVT prophylaxis: Lovenox  Code Status: DNR  Level of Care: Level of care: Med-Surg Family Communication: Son at bedside  Disposition Plan:  Patient is from: Home  Anticipated d/c is to: Home  Anticipated d/c date is: 7/27 or 10/11/22  Patient currently: Pending IR eval, improved electrolytes Consults called: None  Admission status: Observation     Briscoe Deutscher, MD [facility]  10/09/2022, 11:01 PM

## 2022-10-09 NOTE — ED Notes (Signed)
The pt just returned from  c-t 

## 2022-10-09 NOTE — ED Notes (Signed)
ED TO INPATIENT HANDOFF REPORT  ED Nurse Name and Phone #: chris [phone]  S Name/Age/Gender [name] 77 y.o. male Room/Bed: 027C/027C  Code Status   Code Status: Prior  Home/SNF/Other Home  Is this baseline? No   Triage Complete: Triage complete  Chief Complaint Feeding tube problem  Triage Note Pt bib [facility] per family ot is at baseline he is bed bound and non-verbal. His son is his primary caretaker. Pt also has a daughter that is involved.Pt has DNR form with him. According to family pt feeding tube has been leaking for 3 days, and today when they went to use it they were unable to feed him. They say it looks like some blood is coming from the tube.   EMS vitals: 102/67 BP 90s HR 135 CBG   Allergies No Known Allergies  Level of Care/Admitting Diagnosis ED Disposition     ED Disposition  Admit   Condition  --   Comment  The patient appears reasonably stabilized for admission considering the current resources, flow, and capabilities available in the ED at this time, and I doubt any other [facility] requiring further screening and/or treatment in the ED prior to admission is  present.          B Medical/Surgery History Past Medical History:  Diagnosis Date   Adenomatous polyp of colon 06/2010   4 polyps removed at colonoscopy by Dr Marina Goodell   Anxiety    Chronic headaches    Depression    Diverticulosis of colon 08/2010   Internal hemorrhoids 08/2010   Prostate cancer [facility])    receiving radiation treatment   Subdural hematoma, chronic (HCC)    Tuberculosis    Past Surgical History:  Procedure Laterality Date   BIOPSY  01/29/2020   Procedure: BIOPSY;  Surgeon: Hilarie Fredrickson, MD;  Location: [facility] ENDOSCOPY;  Service: Endoscopy;;   COLONOSCOPY WITH PROPOFOL N/A 01/29/2020   Procedure: COLONOSCOPY WITH PROPOFOL;  Surgeon: Hilarie Fredrickson, MD;  Location: [facility] ENDOSCOPY;  Service: Endoscopy;  Laterality: N/A;   ESOPHAGOGASTRODUODENOSCOPY (EGD) WITH PROPOFOL N/A 01/29/2020    Procedure: ESOPHAGOGASTRODUODENOSCOPY (EGD) WITH PROPOFOL;  Surgeon: Hilarie Fredrickson, MD;  Location: [facility] ENDOSCOPY;  Service: Endoscopy;  Laterality: N/A;   FLEXIBLE SIGMOIDOSCOPY  03/29/2012   Procedure: FLEXIBLE SIGMOIDOSCOPY;  Surgeon: Louis Meckel, MD;  Location: [facility] ENDOSCOPY;  Service: Endoscopy;  Laterality: N/A;  possibly may do colonoscopy but prep is only for a flex   FLEXIBLE SIGMOIDOSCOPY  03/31/2012   Procedure: FLEXIBLE SIGMOIDOSCOPY;  Surgeon: Louis Meckel, MD;  Location: [facility] ENDOSCOPY;  Service: Endoscopy;  Laterality: N/A;   HEMORRHOID BANDING  03/31/2012   Procedure: HEMORRHOID BANDING;  Surgeon: Louis Meckel, MD;  Location: [facility] ENDOSCOPY;  Service: Endoscopy;  Laterality: N/A;   HOT HEMOSTASIS N/A 01/29/2020   Procedure: HOT HEMOSTASIS (ARGON PLASMA COAGULATION/BICAP);  Surgeon: Hilarie Fredrickson, MD;  Location: [facility] ENDOSCOPY;  Service: Endoscopy;  Laterality: N/A;   MULTIPLE EXTRACTIONS WITH ALVEOLOPLASTY N/A 08/08/2013   Procedure: MULTIPLE EXTRACTION OF TEETH #1, 2, 3, 6, 7, 9, 11, 15, 16, 17, 20, 21, 22, 23, 24, 26, 29, 30, 32 WITH ALVEOLOPLASTY AND REMOVAL BUCCAL EXOSTOSIS LEFT MAXILLA;  Surgeon: Georgia Lopes, DDS;  Location: [facility] OR;  Service: Oral Surgery;  Laterality: N/A;   shrapnel removal     skull during [name] War     A IV Location/Drains/Wounds Patient Lines/Drains/Airways Status     Active Line/Drains/Airways     Name Placement date Placement time  Site Days   Peripheral IV 10/09/22 20 G Anterior;Proximal;Right Forearm 10/09/22  --  Forearm  less than 1   Peripheral IV 10/09/22 20 G Right Arm 10/09/22  1900  Arm  less than 1            Intake/Output Last 24 hours No intake or output data in the 24 hours ending 10/09/22 2202  Labs/Imaging Results for orders placed or performed during the hospital encounter of 10/09/22 (from the past 48 hour(s))  CBC with Differential     Status: Abnormal   Collection Time: 10/09/22  2:22 PM  Result Value Ref Range    WBC 5.5 4.0 - 10.5 K/uL   RBC 2.75 (L) 4.22 - 5.81 MIL/uL   Hemoglobin 9.0 (L) 13.0 - 17.0 g/dL   HCT 28.4 (L) 13.2 - 44.0 %   MCV 95.6 80.0 - 100.0 fL   MCH 32.7 26.0 - 34.0 pg   MCHC 34.2 30.0 - 36.0 g/dL   RDW 10.2 72.5 - 36.6 %   Platelets 299 150 - 400 K/uL   nRBC 0.0 0.0 - 0.2 %   Neutrophils Relative % 67 %   Neutro Abs 3.7 1.7 - 7.7 K/uL   Lymphocytes Relative 26 %   Lymphs Abs 1.4 0.7 - 4.0 K/uL   Monocytes Relative 5 %   Monocytes Absolute 0.3 0.1 - 1.0 K/uL   Eosinophils Relative 1 %   Eosinophils Absolute 0.1 0.0 - 0.5 K/uL   Basophils Relative 1 %   Basophils Absolute 0.0 0.0 - 0.1 K/uL   Immature Granulocytes 0 %   Abs Immature Granulocytes 0.02 0.00 - 0.07 K/uL    Comment: Performed at [facility] Lab, [address] [place], [place] [address]  Comprehensive metabolic panel     Status: Abnormal   Collection Time: 10/09/22  2:22 PM  Result Value Ref Range   Sodium 125 (L) 135 - 145 mmol/L   Potassium 4.3 3.5 - 5.1 mmol/L   Chloride 100 98 - 111 mmol/L   CO2 19 (L) 22 - 32 mmol/L   Glucose, Bld 89 70 - 99 mg/dL    Comment: Glucose reference range applies only to samples taken after fasting for at least 8 hours.   BUN 17 8 - 23 mg/dL   Creatinine, Ser 7.42 0.61 - 1.24 mg/dL   Calcium 7.5 (L) 8.9 - 10.3 mg/dL   Total Protein 5.7 (L) 6.5 - 8.1 g/dL   Albumin 2.6 (L) 3.5 - 5.0 g/dL   AST 29 15 - 41 U/L   ALT 29 0 - 44 U/L   Alkaline Phosphatase 53 38 - 126 U/L   Total Bilirubin 0.7 0.3 - 1.2 mg/dL   GFR, Estimated >59 >56 mL/min    Comment: (NOTE) Calculated using the CKD-EPI Creatinine Equation (2021)    Anion gap 6 5 - 15    Comment: Performed at [facility], [address] [place], [place] [address]  Lipase, blood     Status: None   Collection Time: 10/09/22  2:22 PM  Result Value Ref Range   Lipase 50 11 - 51 U/L    Comment: Performed at [facility] Lab, [address] [place], [place] [address]  Urinalysis, Routine w reflex microscopic  -Urine, Clean Catch     Status: Abnormal   Collection Time: 10/09/22  2:22 PM  Result Value Ref Range   Color, Urine YELLOW YELLOW   APPearance HAZY (A) CLEAR   Specific  Gravity, Urine 1.010 1.005 - 1.030   pH 7.0 5.0 - 8.0   Glucose, UA NEGATIVE NEGATIVE mg/dL   Hgb urine dipstick NEGATIVE NEGATIVE   Bilirubin Urine NEGATIVE NEGATIVE   Ketones, ur NEGATIVE NEGATIVE mg/dL   Protein, ur NEGATIVE NEGATIVE mg/dL   Nitrite POSITIVE (A) NEGATIVE   Leukocytes,Ua LARGE (A) NEGATIVE   RBC / HPF 0-5 0 - 5 RBC/hpf   WBC, UA 21-50 0 - 5 WBC/hpf   Bacteria, UA FEW (A) NONE SEEN   Squamous Epithelial / HPF 0-5 0 - 5 /HPF   Amorphous Crystal PRESENT     Comment: Performed at [facility], [address], [place], [place] [address]  I-Stat CG4 Lactic Acid     Status: Abnormal   Collection Time: 10/09/22  8:41 PM  Result Value Ref Range   Lactic Acid, Venous 0.4 (L) 0.5 - 1.9 mmol/L   CT ABDOMEN PELVIS W CONTRAST  Result Date: 10/09/2022 CLINICAL DATA:  Leaking feeding tube abdomen pain EXAM: CT ABDOMEN AND PELVIS WITH CONTRAST TECHNIQUE: Multidetector CT imaging of the abdomen and pelvis was performed using the standard protocol following bolus administration of intravenous contrast. RADIATION DOSE REDUCTION: This exam was performed according to the departmental dose-optimization program which includes automated exposure control, adjustment of the mA and/or kV according to patient size and/or use of iterative reconstruction technique. CONTRAST:  60mL OMNIPAQUE IOHEXOL 350 MG/ML SOLN COMPARISON:  CT 07/04/2021 FINDINGS: Lower chest: Lung bases demonstrate centrilobular density in the right middle and lower lobes. There is mucous plugging or debris within right lower lobe bronchi. No consolidation or effusion. Small hiatal hernia Hepatobiliary: Contracted gallbladder. No calcified stone or biliary dilatation. Subcentimeter hypodensity within the central liver too small to further characterize.  Pancreas: No inflammation. Mild diffuse prominence of pancreatic duct measuring up to 4 mm. Spleen: Normal in size without focal abnormality. Adrenals/Urinary Tract: Adrenal glands are within normal limits. Bilateral renal cysts for which no imaging follow-up is recommended. There is no hydronephrosis. Catheter within the bladder. Diffuse bladder wall thickening. Bladder is decompressed Stomach/Bowel: Stomach nondistended. Gastrostomy tube balloon within the body of the stomach. Some skin thickening and loss of subcutaneous soft tissues at the stoma. No rim enhancing fluid collections to suggest an abscess. No obstruction. Decompressed small bowel. No acute bowel wall thickening. Vascular/Lymphatic: Nonaneurysmal aorta. Mild atherosclerosis. No aneurysm. Reproductive: Negative for mass. Metallic clips in the region of the prostate. Other: Negative for pelvic effusion or free air. Musculoskeletal: Scoliosis and degenerative changes of the spine. No acute osseous abnormality. IMPRESSION: 1. Gastrostomy tube balloon within the body of the stomach. Some skin thickening and loss of subcutaneous soft tissues at the stoma, correlate with direct inspection. No rim enhancing fluid collections to suggest an abscess. 2. Findings suggesting infectious or inflammatory bronchiolitis versus aspiration at the right lower lobe and right middle lobe. 3. Diffuse bladder wall thickening, question cystitis. 4. Mild diffuse prominence of pancreatic duct measuring up to 4 mm. No obvious mass. No inflammation 5. Small hiatal hernia. Electronically Signed   By: Jasmine Pang M.D.   On: 10/09/2022 20:14    Pending Labs Unresulted Labs (From admission, onward)     Start     Ordered   10/09/22 2024  Urine Culture (for pregnant, neutropenic or urologic patients or patients with an indwelling urinary catheter)  (Urine Labs)  Once,   URGENT       Question:  Indication  Answer:  Dysuria   10/09/22 2023  Vitals/Pain Today's Vitals   10/09/22 1812 10/09/22 1813 10/09/22 1900 10/09/22 2115  BP:   (!) 78/64 (!) 80/67  Pulse:   (!) 106 94  Resp:   (!) 32 20  Temp: 98.6 F (37 C)     TempSrc:      SpO2:   95% 95%  PainSc:  2       Isolation Precautions No active isolations  Medications Medications  sodium chloride 0.9 % bolus 500 mL (0 mLs Intravenous Stopped 10/09/22 1730)  morphine (PF) 2 MG/ML injection 2 mg (2 mg Intravenous Given 10/09/22 1759)  cefTRIAXone (ROCEPHIN) 1 g in sodium chloride 0.9 % 100 mL IVPB (0 g Intravenous Stopped 10/09/22 2055)  iohexol (OMNIPAQUE) 350 MG/ML injection 60 mL (60 mLs Intravenous Contrast Given 10/09/22 1953)  sodium chloride 0.9 % bolus 1,000 mL (0 mLs Intravenous Stopped 10/09/22 2202)    Mobility non-ambulatory     Focused Assessments Cardiac Assessment Handoff:    Lab Results  Component Value Date   TROPONINI <0.03 05/11/2014   No results found for: "DDIMER" Does the Patient currently have chest pain? No    R Recommendations: See Admitting Provider Note  Report given to:   Additional Notes:

## 2022-10-09 NOTE — ED Triage Notes (Addendum)
Pt bib [facility] per family ot is at baseline he is bed bound and non-verbal. His son is his primary caretaker. Pt also has a daughter that is involved.Pt has DNR form with him. According to family pt feeding tube has been leaking for 3 days, and today when they went to use it they were unable to feed him. They say it looks like some blood is coming from the tube.   EMS vitals: 102/67 BP 90s HR 135 CBG

## 2022-10-09 NOTE — Progress Notes (Signed)
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     Please call with any questions/concerns.    Thank you for the opportunity to participate in this patient's care.  Odette Fraction, MSW [facility] Liaison  [phone]

## 2022-10-09 NOTE — ED Provider Notes (Signed)
[place] EMERGENCY DEPARTMENT AT [facility] Provider Note   CSN: [id number] Arrival date & time: 10/09/22  1317     History  Chief Complaint  Patient presents with   Feeding Tube Leaking   HPI Javaun Webb is a 77 y.o. male with failure to thrive, on home hospice presenting for feeding tube leaking.  Per daughter, it was placed a year and a half ago at at [facility].  Has not been exchanged since then.  In the last 3 days, family has noted that output has been dark almost black in color.  Concerned it may be blood.  Also states that the patient may be in pain.  Denies fever at home.  Patient is also DNR.  Patient is bedbound.  HPI     Home Medications Prior to Admission medications   Medication Sig Start Date End Date Taking? Authorizing Provider  acetaminophen (TYLENOL) 160 MG/5ML solution Place 20.3 mLs (650 mg total) into feeding tube every 6 (six) hours as needed for mild pain, moderate pain or fever. 04/27/22   Hongalgi, Maximino Greenland, MD  albuterol (PROVENTIL) (2.5 MG/3ML) 0.083% nebulizer solution Take 2.5 mg by nebulization every 8 (eight) hours as needed for wheezing or shortness of breath. Patient not taking: Reported on 04/24/2022    [provider]  GERI-TUSSIN 100 MG/5ML liquid Place 5 mLs into feeding tube every 4 (four) hours as needed for cough or to loosen phlegm. 04/21/22   [provider]  Nutritional Supplements (FEEDING SUPPLEMENT, JEVITY 1.2 CAL,) LIQD Place 1,000 mLs into feeding tube continuous. 1,000 mL, Per Tube, at 55 mL/hr, Continuous. 04/27/22   Hongalgi, Maximino Greenland, MD  Water For Irrigation, Sterile (FREE WATER) SOLN Place 120 mLs into feeding tube every 6 (six) hours. 04/27/22   Hongalgi, Maximino Greenland, MD      Allergies    Patient has no known allergies.    Review of Systems   See HPI for pertinent positives   Physical Exam   Vitals:   10/09/22 1337  BP: 100/70  Pulse: 83  Resp: 12  Temp: 98.4 F (36.9 C)  SpO2: 100%     CONSTITUTIONAL:  well-appearing, NAD, cachectic NEURO:  Eyes close, limbs contracted, responsive to painful stimuli.  EYES:  eyes equal and reactive ENT/NECK:  Supple, no stridor CARDIO:  Regular rate and rhythm, appears well-perfused  PULM:  No respiratory distress,on 02 via [place], CTAB GI/GU:  non-distended, rigid, non tender, dark content noted in PEG tube, tube insertion site not erythematous, edematous and not oozing.  MSK/SPINE:  No gross deformities, no edema, moves all extremities  SKIN:  no rash, atraumatic  *Additional and/or pertinent findings included in MDM below  ED Results / Procedures / Treatments   Labs (all labs ordered are listed, but only abnormal results are displayed) Labs Reviewed  CBC WITH DIFFERENTIAL/PLATELET  COMPREHENSIVE METABOLIC PANEL  LIPASE, BLOOD  URINALYSIS, ROUTINE W REFLEX MICROSCOPIC    EKG None  Radiology No results found.  Procedures Procedures    Medications Ordered in ED Medications  sodium chloride 0.9 % bolus 500 mL (has no administration in time range)    ED Course/ Medical Decision Making/ A&P Clinical Course as of 10/09/22 1523  Fri Oct 09, 2022  1505 Hospice, DNR. Deconditioning. [facility] Has not been exchanged since placement. Last 3 days theres been dark color to tube. Insertion site doesn't look infected. Rigid abdomen. Baseline? Malodorous leakage, potential infection? Need to  exchange Gtube. No fevers, nausea, vomiting. If everything looks fine, exchange tube and send home.  [CG]  1508 BP: 100/70 [CG]    Clinical Course User Index [CG] Al Decant, PA-C                             Medical Decision Making  77 year old well-appearing male who overall appears to be chronically declining. Exam overall was unremarkable but did note darkish content in the proximal end of the PEG tube.  DDx includes intra-abdominal infection, abscess, electrolyte derangement, sepsis, other. Given the  concern for pain at home and the difficulty assessing his abdomen, thought it required further evaluation with abdominal labs and CT scan.  If those studies are unremarkable, can proceed with tube exchange.  Per initial PEG tube placement, he will require a 20 French PEG tube.  Signed out patient to PA Jannifer Hick.        Final Clinical Impression(s) / ED Diagnoses Final diagnoses:  Feeding tube dysfunction, initial encounter    Rx / DC Orders ED Discharge Orders     None         Gareth Eagle, PA-C 10/09/22 1523    Long, Arlyss Repress, MD 10/16/22 [phone]

## 2022-10-09 NOTE — ED Provider Notes (Signed)
Physical Exam  BP (!) 80/67   Pulse 94   Temp 98.6 F (37 C)   Resp 20   SpO2 95%   Physical Exam Vitals and nursing note reviewed.  Constitutional:      Appearance: He is well-developed. He is ill-appearing.     Comments: Patient extremity malnourished and cachectic  HENT:     Head: Normocephalic and atraumatic.     Mouth/Throat:     Mouth: Mucous membranes are dry.     Pharynx: Posterior oropharyngeal erythema present. No oropharyngeal exudate.  Eyes:     Conjunctiva/sclera: Conjunctivae normal.  Cardiovascular:     Rate and Rhythm: Normal rate and regular rhythm.     Heart sounds: No murmur heard. Pulmonary:     Effort: Pulmonary effort is normal. No respiratory distress.     Breath sounds: Normal breath sounds.  Abdominal:     Palpations: Abdomen is soft.     Tenderness: There is abdominal tenderness.  Musculoskeletal:        General: No swelling.     Cervical back: Neck supple.     Comments: Extensive contractures to upper and lower extremities bilaterally  Skin:    General: Skin is warm and dry.     Capillary Refill: Capillary refill takes less than 2 seconds.  Neurological:     Mental Status: He is alert.  Psychiatric:        Mood and Affect: Mood normal.     Procedures  Procedures  ED Course / MDM   Clinical Course as of 10/09/22 2318  Fri Oct 09, 2022  1505 Hospice, DNR. Deconditioning. [facility] 1.[facility] years ago [facility]. Has not been exchanged since placement. Last 3 days theres been dark color to tube. Insertion site doesn't look infected. Rigid abdomen. Baseline? Malodorous leakage, potential infection? Need to exchange Gtube. No fevers, nausea, vomiting. If everything looks fine, exchange tube and send home.  [CG]  1508 BP: 100/70 [CG]  1919 Has UTI. Acutely ill [CC]  2054 Size: 20 French Mechanism: PEG [CC]  5547 77 year old male from home with home hospice, medical history significant for what appears to be bedbound/wheelchair mobile with  family, with severe contractures, nonverbal at baseline, G tube, prostate cancer, dementia, failure to thrive, prior recurrent falls, SDH [CG]    Clinical Course User Index [CC] Glyn Ade, MD [CG] Al Decant, PA-C   Medical Decision Making Amount and/or Complexity of Data Reviewed Labs: ordered. Radiology: ordered.  Risk Prescription drug management. Decision regarding hospitalization.   77 year old male signed out to me at shift change pending CT scan, labs, potential G-tube replacement.  Please see previous provider note for further details.  In short, this is a 77 year old male with extensive history who is bedbound and on hospice.  The patient is taking care of by his son who is here at the bedside.  The patient's son, while utilizing translator, states that the patient has been calling out in pain for the last 3 days and having diarrhea.  They are concerned that the patient's G-tube was malfunctioning.  They report that the patient had a G-tube placed 1-1/2 years ago at outside facility and has not had follow-up since that time.  Patient CBC shows no leukocytosis, hemoglobin of 9 which is baseline.  The patient lipase is WNL.  Patient CMP shows sodium 125, no elevated creatinine, no elevated LFTs, anion gap of 6.  Patient potassium 4.3.  Patient lactic acid not elevated.  The patient urinalysis shows  nitrite positive urine as well as leukocytes.  Urine has been cultured.  Will start patient on 1 g ceftriaxone for UTI.  Patient CT abdomen pelvis with contrast shows cystitis, also showing findings concerning for infectious bronchiolitis versus aspiration pneumonia.  Patient already given 1 g ceftriaxone.  Patient given 2 mg morphine for pain, 2 L of fluid for hypotension which could be secondary to position.  At this time, the patient will require admission for G-tube placement in the morning by interventional radiology.  Patient case discussed with hospitalist, Dr. Antionette Char,  who has agreed to admit the patient for further management and care.  This has been communicated to the patient family and they are in agreement with plan of management for the patient be admitted.  The patient has been admitted at this time in stable condition.       Al Decant, PA-C 10/09/22 2318    Glyn Ade, MD 10/09/22 [phone]

## 2022-10-09 NOTE — ED Notes (Signed)
The pts daughter is wanting to speak with the doctor she speaks english  the son at bedside does not speak english

## 2022-10-10 DIAGNOSIS — F03B0 Unspecified dementia, moderate, without behavioral disturbance, psychotic disturbance, mood disturbance, and anxiety: Secondary | ICD-10-CM | POA: Diagnosis present

## 2022-10-10 DIAGNOSIS — Z7401 Bed confinement status: Secondary | ICD-10-CM | POA: Diagnosis not present

## 2022-10-10 DIAGNOSIS — N3000 Acute cystitis without hematuria: Secondary | ICD-10-CM | POA: Diagnosis not present

## 2022-10-10 DIAGNOSIS — K9423 Gastrostomy malfunction: Secondary | ICD-10-CM | POA: Diagnosis present

## 2022-10-10 DIAGNOSIS — K449 Diaphragmatic hernia without obstruction or gangrene: Secondary | ICD-10-CM | POA: Diagnosis present

## 2022-10-10 DIAGNOSIS — Z8619 Personal history of other infectious and parasitic diseases: Secondary | ICD-10-CM | POA: Diagnosis not present

## 2022-10-10 DIAGNOSIS — N390 Urinary tract infection, site not specified: Secondary | ICD-10-CM | POA: Diagnosis present

## 2022-10-10 DIAGNOSIS — Z7189 Other specified counseling: Secondary | ICD-10-CM | POA: Diagnosis not present

## 2022-10-10 DIAGNOSIS — Z681 Body mass index (BMI) 19 or less, adult: Secondary | ICD-10-CM | POA: Diagnosis not present

## 2022-10-10 DIAGNOSIS — Z66 Do not resuscitate: Secondary | ICD-10-CM | POA: Diagnosis present

## 2022-10-10 DIAGNOSIS — Z87891 Personal history of nicotine dependence: Secondary | ICD-10-CM | POA: Diagnosis not present

## 2022-10-10 DIAGNOSIS — J690 Pneumonitis due to inhalation of food and vomit: Secondary | ICD-10-CM

## 2022-10-10 DIAGNOSIS — Z8546 Personal history of malignant neoplasm of prostate: Secondary | ICD-10-CM | POA: Diagnosis not present

## 2022-10-10 DIAGNOSIS — R627 Adult failure to thrive: Secondary | ICD-10-CM | POA: Diagnosis present

## 2022-10-10 DIAGNOSIS — F32A Depression, unspecified: Secondary | ICD-10-CM | POA: Diagnosis present

## 2022-10-10 DIAGNOSIS — E43 Unspecified severe protein-calorie malnutrition: Secondary | ICD-10-CM | POA: Diagnosis present

## 2022-10-10 DIAGNOSIS — E871 Hypo-osmolality and hyponatremia: Secondary | ICD-10-CM | POA: Diagnosis present

## 2022-10-10 DIAGNOSIS — R64 Cachexia: Secondary | ICD-10-CM | POA: Diagnosis present

## 2022-10-10 DIAGNOSIS — D649 Anemia, unspecified: Secondary | ICD-10-CM | POA: Diagnosis present

## 2022-10-10 DIAGNOSIS — Z515 Encounter for palliative care: Secondary | ICD-10-CM

## 2022-10-10 DIAGNOSIS — Z86010 Personal history of colonic polyps: Secondary | ICD-10-CM | POA: Diagnosis not present

## 2022-10-10 DIAGNOSIS — E860 Dehydration: Secondary | ICD-10-CM | POA: Diagnosis present

## 2022-10-10 DIAGNOSIS — E8809 Other disorders of plasma-protein metabolism, not elsewhere classified: Secondary | ICD-10-CM | POA: Diagnosis present

## 2022-10-10 DIAGNOSIS — E861 Hypovolemia: Secondary | ICD-10-CM | POA: Diagnosis present

## 2022-10-10 DIAGNOSIS — E872 Acidosis, unspecified: Secondary | ICD-10-CM | POA: Diagnosis present

## 2022-10-10 LAB — GLUCOSE, CAPILLARY
Glucose-Capillary: 48 mg/dL — ABNORMAL LOW (ref 70–99)
Glucose-Capillary: 186 mg/dL — ABNORMAL HIGH (ref 70–99)

## 2022-10-10 MED ORDER — SODIUM CHLORIDE 0.9 % IV SOLN: INTRAVENOUS | Status: DC

## 2022-10-10 MED ORDER — LEVALBUTEROL HCL 0.63 MG/3ML IN NEBU
0.6300 mg | INHALATION_SOLUTION | Freq: Four times a day (QID) | RESPIRATORY_TRACT | Status: DC
  Filled 2022-10-10 (×2): qty 3

## 2022-10-10 MED ORDER — SODIUM CHLORIDE 0.9 % IV SOLN
1.5000 g | Freq: Three times a day (TID) | INTRAVENOUS | Status: DC
  Administered 2022-10-10 – 2022-10-13 (×8): 1.5 g via INTRAVENOUS
  Filled 2022-10-10 (×10): qty 4

## 2022-10-10 MED ORDER — IPRATROPIUM BROMIDE 0.02 % IN SOLN
0.5000 mg | Freq: Four times a day (QID) | RESPIRATORY_TRACT | Status: DC
  Administered 2022-10-10: 0.5 mg via RESPIRATORY_TRACT
  Filled 2022-10-10: qty 2.5

## 2022-10-10 MED ORDER — IPRATROPIUM BROMIDE 0.02 % IN SOLN
0.5000 mg | Freq: Two times a day (BID) | RESPIRATORY_TRACT | Status: DC
  Administered 2022-10-11 – 2022-10-13 (×6): 0.5 mg via RESPIRATORY_TRACT
  Filled 2022-10-10 (×7): qty 2.5

## 2022-10-10 MED ORDER — ENOXAPARIN SODIUM 30 MG/0.3ML IJ SOSY
30.0000 mg | PREFILLED_SYRINGE | Freq: Every day | INTRAMUSCULAR | Status: DC
  Administered 2022-10-11 – 2022-10-14 (×4): 30 mg via SUBCUTANEOUS
  Filled 2022-10-10 (×3): qty 0.3

## 2022-10-10 MED ORDER — DEXTROSE 50 % IV SOLN
INTRAVENOUS | Status: AC
  Administered 2022-10-10: 50 mL
  Filled 2022-10-10: qty 50

## 2022-10-10 MED ORDER — LEVALBUTEROL HCL 0.63 MG/3ML IN NEBU
0.6300 mg | INHALATION_SOLUTION | Freq: Two times a day (BID) | RESPIRATORY_TRACT | Status: DC
  Administered 2022-10-11 – 2022-10-13 (×5): 0.63 mg via RESPIRATORY_TRACT
  Filled 2022-10-10 (×9): qty 3

## 2022-10-10 NOTE — Progress Notes (Signed)
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  Patient's family noted leakage around the entry site of the feeding tube the last 3 days and son notes that he is unable to flush the tube the night before last or the day before that.  Family noted the patient appeared to be in pain grabbing lower abdomen.  Because of this they brought him to the ED and he was found to be afebrile and slightly tachycardic with a soft blood pressure.  Labs were notable for hyponatremia and hypoalbuminemia.  Urinalysis notable for bacteriuria, pyuria and positive nitrites.  Urine culture was obtained in the ED and he was given 1.5 L normal saline bolus and given some morphine and Rocephin.  Antibiotics were changed to IV meropenem.  His G-tube was changed by the interventional radiology team.  Further workup was revealed that he has an aspiration pneumonia.  Palliative care has been consulted for further goals of care discussion as well as the hospice team.  Assessment and Plan:  Gastrostomy tube malfunction  - Pt is dependent on feeding tube which had leakage around it for 3 days and could not be flushed last night at home   ED personnel unable to get tube to function  -Consult IR, continue IVF hydration for now  -IR was able to replace G tube at bedside  Aspiration Pneumonia -Will also add IV Unasyn in Addition to the Meropenem -Elevate Head of Bed -Aspiration Precautions and continue NPO -Xopenex/Atrovent -Repeat CXR in the AM   UTI  -Hx of ESBL in urine  -Urinalysis done and showed a hazy appearance with large leukocytes, positive  nitrites, negative protein, few bacteria, 0-5 RBCs per high-power field, 21-50 WBCs and urine culture that showed multiple species -CT scan of the abdomen pelvis done that showed "Gastrostomy tube balloon within the body of the stomach. Some skin thickening and loss of subcutaneous soft tissues at the stoma, correlate with direct inspection. No rim enhancing fluid collections to suggest an abscess. Findings suggesting infectious or inflammatory bronchiolitis versus aspiration at the right lower lobe and right middle lobe. Diffuse bladder wall thickening, question cystitis. Mild diffuse prominence of pancreatic duct measuring up to 4 mm. No obvious mass. No inflammation. Small hiatal hernia." -Urine cultured in ED, will treat with meropenem for now, follow culture     Hyponatremia  -Serum sodium is 125 on admission in setting of hypovolemia  -Na+ Trend: Recent Labs  Lab 10/09/22 1422 10/10/22 0644  NA 125* 130*  -Continue IVF hydration with repeat NS at 50 mL/hr and repeat chem panel in am    Metabolic Acidosis -Mild. CO2 is now 19, AG of 6, and Chloride and Level of 105 -Continue to Monitor and Trend and repeat CMP in the AM  Normocytic Anemia -Hgb/Hct Trend: Recent Labs  Lab 10/09/22 1422 10/10/22 0644  HGB 9.0* 9.2*  HCT 26.3* 26.5*  MCV 95.6 94.6  -Check Anemia Panel in the AM -Continue to Monitor for S/Sx of Bleeding; No overt bleeding noted -Repeat CBC in the AM   Hypoalbuminemia -Patient's Albumin Trend: Recent Labs  Lab 10/09/22 1422  ALBUMIN 2.6*  -Continue to Monitor  and Trend and repeat CMP in the AM  Failure to Thrive GOC Cachectic and Underweight -Estimated body mass index is 16.41 kg/m as calculated from the following:   Height as of 04/24/22: 5\' 1"  (1.549 m).   Weight as of this encounter: 39.4 kg. -Patient is a Hospice Patient -Palliative Consulted for GOC discussion   DVT prophylaxis: enoxaparin (LOVENOX) injection 30 mg Start: 10/11/22 1000    Code  Status: DNR Family Communication: Discussed with Daughter and Son at bedside  Disposition Plan:  Level of care: Med-Surg Status is: Inpatient Remains inpatient appropriate because: Needs further clinical Improvement   Consultants:  Palliative Care Medicine  Procedures:  G Tube Replacement  Antimicrobials:  Anti-infectives (From admission, onward)    Start     Dose/Rate Route Frequency Ordered Stop   10/09/22 2315  meropenem (MERREM) 1 g in sodium chloride 0.9 % 100 mL IVPB        1 g 200 mL/hr over 30 Minutes Intravenous Every 12 hours 10/09/22 2306     10/09/22 2245  meropenem (MERREM) 1 g in sodium chloride 0.9 % 100 mL IVPB  Status:  Discontinued        1 g 200 mL/hr over 30 Minutes Intravenous Every 8 hours 10/09/22 2241 10/09/22 2306   10/09/22 2030  azithromycin (ZITHROMAX) 500 mg in sodium chloride 0.9 % 250 mL IVPB  Status:  Discontinued        500 mg 250 mL/hr over 60 Minutes Intravenous  Once 10/09/22 2020 10/09/22 2020   10/09/22 1915  cefTRIAXone (ROCEPHIN) 1 g in sodium chloride 0.9 % 100 mL IVPB        1 g 200 mL/hr over 30 Minutes Intravenous  Once 10/09/22 1907 10/09/22 2055       Subjective: Seen and examined and had some rhonchi and G tube replaced. Unable to participate in Subjective History portion. Daughter and Son at bedside with several questions which were answered to their satisfaction.   Objective: Vitals:   10/10/22 0600 10/10/22 0727 10/10/22 1148 10/10/22 1437  BP: 100/70 (!) 81/60 119/87 103/78  Pulse: 91 71  79  Resp:      Temp: 98.3 F (36.8 C) 97.6 F (36.4 C)  98.1 F (36.7 C)  TempSrc: Axillary Oral  Oral  SpO2: 98% 100%  100%  Weight:        Intake/Output Summary (Last 24 hours) at 10/10/2022 2023 Last data filed at 10/10/2022 1610 Gross per 24 hour  Intake 265.62 ml  Output --  Net 265.62 ml   Filed Weights   10/10/22 0500  Weight: 39.4 kg   Examination: Physical Exam:  Constitutional: Thin chronically ill-appearing  cachectic [place] male who is contracted Respiratory: Diminished to auscultation bilaterally with coarse breath sounds and has some rhonchi but has a slightly elevated respiratory rate and but no appreciable wheezing and is not wearing supplemental oxygen nasal cannula Cardiovascular: RRR, no murmurs / rubs / gallops. S1 and S2 auscultated. No extremity edema.  Abdomen: Soft, non-tender, non-distended.  G tube in place now. Bowel sounds positive.  GU: Deferred. Musculoskeletal: Significant contractures Skin: No rashes, lesions, ulcers on limited skin evaluation. No induration; Warm and dry.  Neurologic: He is nonverbal and does not participate in neuro examination and has significant contractures Psychiatric: Impaired judgment and insight  Data Reviewed: I have personally reviewed following labs and imaging studies  CBC: Recent Labs  Lab 10/09/22 1422 10/10/22 0644  WBC 5.5 4.8  NEUTROABS 3.7  --  HGB 9.0* 9.2*  HCT 26.3* 26.5*  MCV 95.6 94.6  PLT 299 290   Basic Metabolic Panel: Recent Labs  Lab 10/09/22 1422 10/10/22 0644  NA 125* 130*  K 4.3 4.2  CL 100 105  CO2 19* 19*  GLUCOSE 89 77  BUN 17 13  CREATININE 0.69 0.67  CALCIUM 7.5* 8.2*   GFR: Estimated Creatinine Clearance: 44.5 mL/min (by C-G formula based on SCr of 0.67 mg/dL). Liver Function Tests: Recent Labs  Lab 10/09/22 1422  AST 29  ALT 29  ALKPHOS 53  BILITOT 0.7  PROT 5.7*  ALBUMIN 2.6*   Recent Labs  Lab 10/09/22 1422  LIPASE 50   No results for input(s): "AMMONIA" in the last 168 hours. Coagulation Profile: No results for input(s): "INR", "PROTIME" in the last 168 hours. Cardiac Enzymes: No results for input(s): "CKTOTAL", "CKMB", "CKMBINDEX", "TROPONINI" in the last 168 hours. BNP (last 3 results) No results for input(s): "PROBNP" in the last 8760 hours. HbA1C: No results for input(s): "HGBA1C" in the last 72 hours. CBG: No results for input(s): "GLUCAP" in the last 168  hours. Lipid Profile: No results for input(s): "CHOL", "HDL", "LDLCALC", "TRIG", "CHOLHDL", "LDLDIRECT" in the last 72 hours. Thyroid Function Tests: No results for input(s): "TSH", "T4TOTAL", "FREET4", "T3FREE", "THYROIDAB" in the last 72 hours. Anemia Panel: No results for input(s): "VITAMINB12", "FOLATE", "FERRITIN", "TIBC", "IRON", "RETICCTPCT" in the last 72 hours. Sepsis Labs: Recent Labs  Lab 10/09/22 2041  LATICACIDVEN 0.4*   Recent Results (from the past 240 hour(s))  Urine Culture (for pregnant, neutropenic or urologic patients or patients with an indwelling urinary catheter)     Status: Abnormal   Collection Time: 10/09/22  2:22 PM   Specimen: Urine, Clean Catch  Result Value Ref Range Status   Specimen Description URINE, CLEAN CATCH  Final   Special Requests   Final    NONE Performed at [facility], [address], [place], [place] [address]    Culture MULTIPLE SPECIES PRESENT, SUGGEST RECOLLECTION (A)  Final   Report Status 10/10/2022 FINAL  Final    Radiology Studies: CT ABDOMEN PELVIS W CONTRAST  Result Date: 10/09/2022 CLINICAL DATA:  Leaking feeding tube abdomen pain EXAM: CT ABDOMEN AND PELVIS WITH CONTRAST TECHNIQUE: Multidetector CT imaging of the abdomen and pelvis was performed using the standard protocol following bolus administration of intravenous contrast. RADIATION DOSE REDUCTION: This exam was performed according to the departmental dose-optimization program which includes automated exposure control, adjustment of the mA and/or kV according to patient size and/or use of iterative reconstruction technique. CONTRAST:  60mL OMNIPAQUE IOHEXOL 350 MG/ML SOLN COMPARISON:  CT 07/04/2021 FINDINGS: Lower chest: Lung bases demonstrate centrilobular density in the right middle and lower lobes. There is mucous plugging or debris within right lower lobe bronchi. No consolidation or effusion. Small hiatal hernia Hepatobiliary: Contracted gallbladder. No calcified  stone or biliary dilatation. Subcentimeter hypodensity within the central liver too small to further characterize. Pancreas: No inflammation. Mild diffuse prominence of pancreatic duct measuring up to 4 mm. Spleen: Normal in size without focal abnormality. Adrenals/Urinary Tract: Adrenal glands are within normal limits. Bilateral renal cysts for which no imaging follow-up is recommended. There is no hydronephrosis. Catheter within the bladder. Diffuse bladder wall thickening. Bladder is decompressed Stomach/Bowel: Stomach nondistended. Gastrostomy tube balloon within the body of the stomach. Some skin thickening and loss of subcutaneous soft tissues at the stoma. No rim enhancing fluid collections to suggest an abscess. No obstruction. Decompressed small bowel.  No acute bowel wall thickening. Vascular/Lymphatic: Nonaneurysmal aorta. Mild atherosclerosis. No aneurysm. Reproductive: Negative for mass. Metallic clips in the region of the prostate. Other: Negative for pelvic effusion or free air. Musculoskeletal: Scoliosis and degenerative changes of the spine. No acute osseous abnormality. IMPRESSION: 1. Gastrostomy tube balloon within the body of the stomach. Some skin thickening and loss of subcutaneous soft tissues at the stoma, correlate with direct inspection. No rim enhancing fluid collections to suggest an abscess. 2. Findings suggesting infectious or inflammatory bronchiolitis versus aspiration at the right lower lobe and right middle lobe. 3. Diffuse bladder wall thickening, question cystitis. 4. Mild diffuse prominence of pancreatic duct measuring up to 4 mm. No obvious mass. No inflammation 5. Small hiatal hernia. Electronically Signed   By: Jasmine Pang M.D.   On: 10/09/2022 20:14    Scheduled Meds:  [START ON 10/11/2022] enoxaparin (LOVENOX) injection  30 mg Subcutaneous Daily   [START ON 10/11/2022] ipratropium  0.5 mg Nebulization BID   [START ON 10/11/2022] levalbuterol  0.63 mg Nebulization BID    Continuous Infusions:  meropenem (MERREM) IV 1 g (10/10/22 0857)    LOS: 0 days   Marguerita Merles, DO [facility] Available via Epic secure chat 7am-7pm After these hours, please refer to coverage provider listed on [email or web address] 10/10/2022, 8:23 PM

## 2022-10-10 NOTE — Progress Notes (Signed)
Pharmacy Antibiotic Note  [name] is a 77 y.o. male for which pharmacy has been consulted for Unasyn for aspiration pneumonia. Patient here from home hospice with feeding tube problem s/p exchange by IR 7/27.    SCr 0.67 (CrCl ~40 ml/min using stated weight) WBC 5.5; LA 0.4; T 98.6; HR 94; RR 20  Plan: Start Unasyn 1.5gm IV q8h Monitor WBC, fever, renal function   Weight: 39.4 kg (86 lb 13.8 oz)  Temp (24hrs), Avg:98.1 F (36.7 C), Min:97.6 F (36.4 C), Max:98.5 F (36.9 C)  Recent Labs  Lab 10/09/22 1422 10/09/22 2041 10/10/22 0644  WBC 5.5  --  4.8  CREATININE 0.69  --  0.67  LATICACIDVEN  --  0.4*  --     Estimated Creatinine Clearance: 44.5 mL/min (by C-G formula based on SCr of 0.67 mg/dL).    No Known Allergies  Microbiology results: 7/26: urine: mult species, none predominant  Thank you for allowing pharmacy to be a part of this patient's care.  [employer], Pharm.D., BCPS Clinical Pharmacist  **Pharmacist phone directory can be found on [email or web address] listed under [facility].  10/10/2022 8:30 PM

## 2022-10-10 NOTE — Hospital Course (Addendum)
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  Patient's family noted leakage around the entry site of the feeding tube the last 3 days and son notes that he is unable to flush the tube the night before last or the day before that.  Family noted the patient appeared to be in pain grabbing lower abdomen.  Because of this they brought him to the ED and he was found to be afebrile and slightly tachycardic with a soft blood pressure.  Labs were notable for hyponatremia and hypoalbuminemia.  Urinalysis notable for bacteriuria, pyuria and positive nitrites.  Urine culture was obtained in the ED and he was given 1.5 L normal saline bolus and given some morphine and Rocephin.  Antibiotics were changed to IV meropenem.  His G-tube was changed by the interventional radiology team.  Further workup was revealed that he has an aspiration pneumonia.  Palliative care has been consulted for further goals of care discussion as well as the hospice team.  Patient is doing better today and not as rhonchorous.  Getting Unasyn and meropenem.  Urine culture showing multiple species so we will recollect.  Chest x-ray showed improvement and showed no active disease.  Assessment and Plan:  Gastrostomy tube malfunction  - Pt is dependent on feeding tube which had leakage around it for 3 days and could not be flushed last night at home   ED personnel unable to get tube to function  -Consult IR, continue IVF hydration for now  -IR was able to replace G tube at bedside; tube feedings have been resumed and he was little hypoglycemic overnight given that the tube feedings had not been resumed at that time  Aspiration Pneumonia -Will also add IV Unasyn in Addition to the  Meropenem -Elevate Head of Bed -Suction as needed -Aspiration Precautions and continue NPO -Xopenex/Atrovent -Repeat CXR in the AM and this morning's chest x-ray showed no active disease   UTI  -Hx of ESBL in urine  -Urinalysis done and showed a hazy appearance with large leukocytes, positive nitrites, negative protein, few bacteria, 0-5 RBCs per high-power field, 21-50 WBCs and urine culture that showed multiple species so this is will be recollected -CT scan of the abdomen pelvis done that showed "Gastrostomy tube balloon within the body of the stomach. Some skin thickening and loss of subcutaneous soft tissues at the stoma, correlate with direct inspection. No rim enhancing fluid collections to suggest an abscess. Findings suggesting infectious or inflammatory bronchiolitis versus aspiration at the right lower lobe and right middle lobe. Diffuse bladder wall thickening, question cystitis. Mild diffuse prominence of pancreatic duct measuring up to 4 mm. No obvious mass. No inflammation. Small hiatal hernia." -Urine cultured in ED, will treat with meropenem for now, follow culture on repeat   Hyponatremia  -Serum sodium is 125 on admission in setting of hypovolemia  -Na+ Trend: Recent Labs  Lab 10/09/22 1422 10/10/22 0644 10/11/22 0303  NA 125* 130* 130*  -Discontinued IV fluid hydration and resume home tube feedings -Continue to monitor and trend and repeat CMP in the a.m.  Metabolic Acidosis -Mild. CO2 is now 19, AG of 6, and Chloride and Level of 105 -Continue to Monitor and Trend and repeat CMP in the AM  Normocytic Anemia -Hgb/Hct Trend: Recent  Labs  Lab 10/09/22 1422 10/10/22 0644 10/11/22 0303  HGB 9.0* 9.2* 10.0*  HCT 26.3* 26.5* 28.4*  MCV 95.6 94.6 97.6  -Check Anemia Panel in the AM -Continue to Monitor for S/Sx of Bleeding; No overt bleeding noted -Repeat CBC in the AM   Hypoalbuminemia -Patient's Albumin Trend: Recent Labs  Lab 10/09/22 1422 10/11/22 0303   ALBUMIN 2.6* 2.6*  -Continue to Monitor and Trend and repeat CMP in the AM  Failure to Thrive GOC Cachectic and Underweight -Estimated body mass index is 16.45 kg/m as calculated from the following:   Height as of 04/24/22: 5\' 1"  (1.549 m).   Weight as of this encounter: 39.5 kg. -Patient is a Hospice Patient -Palliative Consulted for GOC discussion patient's family wants to continue present treatment inclusive of antibiotics and IV fluids with clear wishes to treat the treatable; continuing antibiotics and await repeat urine culture result

## 2022-10-10 NOTE — Progress Notes (Signed)
Old G-tube removed. New 20 Fr balloon retention G-tube placed at bedside. Bumper cinched. Dressing applied. Tube flushes easily. Pt son shown and demonstrated how to use new en-fit syringe for TF and flushes. Ok to use and discharge from IR standpoint.  Brayton El PA-C Interventional Radiology 10/10/2022 10:11 AM

## 2022-10-10 NOTE — Plan of Care (Signed)
  Problem: Clinical Measurements: Goal: Respiratory complications will improve Outcome: Progressing   Problem: Nutrition: Goal: Adequate nutrition will be maintained Outcome: Progressing   Problem: Coping: Goal: Level of anxiety will decrease Outcome: Progressing   Problem: Elimination: Goal: Will not experience complications related to bowel motility Outcome: Progressing   Problem: Safety: Goal: Ability to remain free from injury will improve Outcome: Progressing

## 2022-10-10 NOTE — ED Notes (Signed)
Pt transferred to ED 47 and received for care at this time.  Family at bedside.  Bed in lowest position, wheels locked.  Call bell within reach of family.

## 2022-10-10 NOTE — Progress Notes (Signed)
Pt arrived to unit at 0030, alert but nonverbal, bed bound, hands/legs contracted, son is at bedside who is his caregiver at home, pt is moaning, sons says is d/t pain, foley cath in place. [place]kin issues noted.

## 2022-10-10 NOTE — Progress Notes (Signed)
[facility] Patient Note  Current hospice patient followed at home for terminal diagnosis of CVA.  Patient's family called EMS for transport stating problem with PEG.  Patient admitted to [facility] 7.26.24 with dx: problem with PEG and UTI.  Per Dr. Kirt Boys, hospice MD, this is a related hospice admission.  Patient lying in bed contracted.  Eyes open, but patient does respond.  No family currently at bedside.  Patient is post palliative care consult today to confirm goals of care.  Palliative notified that family that she felt patient was starting the dying process.  They want to continue to treat the treatable and wish to continue IV antibiotics for UTI.  Patient had PEG replaced today by Intervention Radiology. 20Fr balloon patent and flushed easily.  Patient is appropriate for GIP level of care for symptom management of pain and requires skilled assessment to monitor effects of treatment plan.  Abnormal Labs:  Na130, CO2 19, Calcium 8.2, Albumin 2.6, Total Protein 5.7, RBC 2.8, Hgb 9.2, HCT 26.5  Urinalysis:  Appearance- hazy, Leukocytes "large", Nitrite "positive", Bacteria "few" Urine Culture- pending  Vital Signs:  T- 98.5, BP 71/55, P 83, R-20, Oxi 99% on 2L/[place]  IV Medications Merrem 1g every 12 hours for UTI Fentanyl  12.51mcg IV @ 0052, IV @0618 , 50mg  IV @ 1153- all given for pain  Assessment/Plan   1. Gastrostomy tube malfunction  - Pt is dependent on feeding tube which had leakage around it for 3 days and could not be flushed last night at home  - ED personnel unable to get tube to function  - Consult IR, continue IVF hydration for now  - Patient had PEG replaced today by IR.  Son instructed on using new tube.    2. UTI  - Hx of ESBL in urine  - Urine cultured in ED, will treat with meropenem for now, follow culture     3. Hyponatremia  - Serum sodium is 125 on admission in setting of hypovolemia  - Continue IVF hydration and  repeat chem panel in am    Discharge Planning:  Ongoing- after finishes antibiotics for UTI Family contact:  No family at bedside.   IDT:  Updated   Goals of Care- ongoing.  Post palliative goals of care conversation today.  Family want to continue to treat the treatable.  DNR remains.  Medication list placed on shadow chart   Should patient need ambulance transfer at discharge- please use [facility]) as they contract this service for our active hospice patients.  Norris Cross, [facility] Liaison [phone]

## 2022-10-10 NOTE — Progress Notes (Signed)
Lab unable to collect blood.

## 2022-10-10 NOTE — Consult Note (Signed)
Palliative Medicine Inpatient Consult Note  Consulting Provider:  Merlene Laughter, DO   Reason for consult:   Palliative Care Consult Services Palliative Medicine Consult  Reason for Consult? GOC Discussion   10/10/2022  HPI:  Per intake H&P --> [name] is a74 y.o. male with medical history significant for chronic subdural hematoma and dementia who is nonverbal, bedbound, and severely contracted at baseline. Admitted to replace G-Tube. Palliative care has been asked to re-address goals of care.  Clinical Assessment/Goals of Care:  *Please note that this is a verbal dictation therefore any spelling or grammatical errors are due to the "Dragon Medical One" system interpretation.  I have reviewed medical records including EPIC notes, labs and imaging, received report from bedside RN, assessed the patient who is chronically ill in appearance.    I met with patients son, [name] and spoke on speaker-phone to daughter, [name] to further discuss diagnosis prognosis, GOC, EOL wishes, disposition and options.   I introduced Palliative Medicine as specialized medical care for people living with serious illness. It focuses on providing relief from the symptoms and stress of a serious illness. The goal is to improve quality of life for both the patient and the family.  Medical History Review and Understanding:  A review of [name]'s past medical history was held inclusive of dementia, subdural hematoma, anxiety, depression,  Social History:  [name] in [place] Patient originally from [name] and relocated in 2009.   He has 2 children and one grandchild. He served in the [name] [employer] for more than 10 years. He formerly worked Chartered certified accountant. He is a man of Christian faith.   Functional and Nutritional State:  [name] lives in the home with his ex-wife and her husband. His primary caregiver is his son, [name]. He is totally bed-bound and dependent on CG for all basic needs. He  is dependent on G-Tube feeds for nutritional purposes.      Advance Directives:  A detailed discussion was had today regarding advanced directives.  No patients two children make decisions for him.   Code Status:  Concepts specific to code status, artifical feeding and hydration, continued IV antibiotics and rehospitalization was had.  The difference between a aggressive medical intervention path  and a palliative comfort care path for this patient at this time was had.   [name] is an established DNAR/DNI code status/   Discussion:  We reviewed the concerns today in regards to [name]'s present medical conditions and poor overall state. [name]'s daughter feels that he has been on hospice for the past 1.5 years and has been "strong". She feels his health overall is stable. I provided increased education on patients present diagnosis, frailty, and poor prognosis. I shared openly and honestly that I feel [name]'s body is slowly shutting down and that he is dying. I shared it may be of utility to focus on comfort.   Emphasized kindly that even with the best medical interventions we are all going to meet our final time.   Patients son and daughter alike want [name] to continue present treatments inclusive of antibiotics for his UTI and IVF's for dehydration. Family clear in expressed wishes to continue to treat the treatable.  Discussed the importance of continued conversation with family and their  medical providers regarding overall plan of care and treatment options, ensuring decisions are within the context of the patients values and GOCs.  Decision Maker: [name] (Daughter): [phone] (Mobile)   SUMMARY OF RECOMMENDATIONS   DNAR/DNI  S/P G-Tube exchange  Open and honest conversations held in the setting of chronic disease burden and long term outcomes  Goals are to optimize and get back home with Hospice   [facility] will continue to follow daily  The PMT will peripherally  follow along though please reach out if additional support is needed  Code Status/Advance Care Planning: DNAR/DNI    Palliative Prophylaxis:  Aspiration, Bowel Regimen, Delirium Protocol, Frequent Pain Assessment, Oral Care, Palliative Wound Care, and Turn Reposition  Additional Recommendations (Limitations, Scope, Preferences): Continue current care  Psycho-social/Spiritual:  Desire for further Chaplaincy support: Yes Additional Recommendations: Education on current disease/illness burden   Prognosis: Very poor - hospice is appropriate  Discharge Planning: Discharge home when medically optimized  Vitals:   10/10/22 0727 10/10/22 1148  BP: (!) 81/60 119/87  Pulse: 71   Resp:    Temp: 97.6 F (36.4 C)   SpO2: 100%     Intake/Output Summary (Last 24 hours) at 10/10/2022 1419 Last data filed at 10/10/2022 9604 Gross per 24 hour  Intake 265.62 ml  Output --  Net 265.62 ml   Last Weight  Most recent update: 10/10/2022  6:46 AM    Weight  39.4 kg (86 lb 13.8 oz)            Gen:  Exceptionally frail [place] M HEENT: Dry mucous membranes CV: Regular rate and rhythm PULM:  On 2LPM N, breathing is even and nonlabored ABD: soft/nontender  EXT: Profound muscle wasting in all extremities Neuro: Somnolent  PPS: 10%   This conversation/these recommendations were discussed with patient primary care team, Dr. Marland Mcalpine  Billing based on MDM: High ________________________________________ Lamarr Lulas [facility] Palliative Medicine Team Team Cell Phone: [phone] Please utilize secure chat with additional questions, if there is no response within 30 minutes please call the above phone number  Palliative Medicine Team providers are available by phone from 7am to 7pm daily and can be reached through the team cell phone.  Should this patient require assistance outside of these hours, please call the patient's attending physician.

## 2022-10-11 ENCOUNTER — Inpatient Hospital Stay (HOSPITAL_COMMUNITY): Payer: Medicare Other

## 2022-10-11 DIAGNOSIS — E871 Hypo-osmolality and hyponatremia: Secondary | ICD-10-CM | POA: Diagnosis not present

## 2022-10-11 DIAGNOSIS — J690 Pneumonitis due to inhalation of food and vomit: Secondary | ICD-10-CM | POA: Diagnosis not present

## 2022-10-11 DIAGNOSIS — F03B0 Unspecified dementia, moderate, without behavioral disturbance, psychotic disturbance, mood disturbance, and anxiety: Secondary | ICD-10-CM | POA: Diagnosis not present

## 2022-10-11 DIAGNOSIS — K9423 Gastrostomy malfunction: Secondary | ICD-10-CM | POA: Diagnosis not present

## 2022-10-11 DIAGNOSIS — N3000 Acute cystitis without hematuria: Secondary | ICD-10-CM | POA: Diagnosis not present

## 2022-10-11 LAB — COMPREHENSIVE METABOLIC PANEL WITH GFR
ALT: 27 U/L (ref 0–44)
AST: 29 U/L (ref 15–41)
Albumin: 2.6 g/dL — ABNORMAL LOW (ref 3.5–5.0)
Alkaline Phosphatase: 63 U/L (ref 38–126)
Anion gap: 7 (ref 5–15)
BUN: 13 mg/dL (ref 8–23)
CO2: 18 mmol/L — ABNORMAL LOW (ref 22–32)
Calcium: 8.1 mg/dL — ABNORMAL LOW (ref 8.9–10.3)
Chloride: 105 mmol/L (ref 98–111)
Creatinine, Ser: 0.77 mg/dL (ref 0.61–1.24)
GFR, Estimated: >60 mL/min (ref >60)
Glucose, Bld: 88 mg/dL (ref 70–99)
Potassium: 4.1 mmol/L (ref 3.5–5.1)
Sodium: 130 mmol/L — ABNORMAL LOW (ref 135–145)
Total Bilirubin: 0.7 mg/dL (ref 0.3–1.2)
Total Protein: 5.8 g/dL — ABNORMAL LOW (ref 6.5–8.1)

## 2022-10-11 LAB — CBC WITH DIFFERENTIAL/PLATELET
Abs Immature Granulocytes: 0.01 10*3/uL (ref 0.00–0.07)
Basophils Absolute: 0.1 10*3/uL (ref 0.0–0.1)
Basophils Relative: 2 %
Eosinophils Absolute: 0.1 10*3/uL (ref 0.0–0.5)
Eosinophils Relative: 2 %
HCT: 28.4 % — ABNORMAL LOW (ref 39.0–52.0)
Hemoglobin: 10 g/dL — ABNORMAL LOW (ref 13.0–17.0)
Immature Granulocytes: 0 %
Lymphocytes Relative: 29 %
Lymphs Abs: 1 10*3/uL (ref 0.7–4.0)
MCH: 34.4 pg — ABNORMAL HIGH (ref 26.0–34.0)
MCHC: 35.2 g/dL (ref 30.0–36.0)
MCV: 97.6 fL (ref 80.0–100.0)
Monocytes Absolute: 0.2 10*3/uL (ref 0.1–1.0)
Monocytes Relative: 4 %
Neutro Abs: 2.1 10*3/uL (ref 1.7–7.7)
Neutrophils Relative %: 63 %
Platelets: 270 10*3/uL (ref 150–400)
RBC: 2.91 MIL/uL — ABNORMAL LOW (ref 4.22–5.81)
RDW: 12.6 % (ref 11.5–15.5)
WBC: 3.4 10*3/uL — ABNORMAL LOW (ref 4.0–10.5)
nRBC: 0 % (ref 0.0–0.2)

## 2022-10-11 LAB — URINALYSIS, W/ REFLEX TO CULTURE (INFECTION SUSPECTED)
Bilirubin Urine: NEGATIVE
Glucose, UA: NEGATIVE mg/dL
Hgb urine dipstick: NEGATIVE
Ketones, ur: NEGATIVE mg/dL
Nitrite: NEGATIVE
Protein, ur: NEGATIVE mg/dL
Specific Gravity, Urine: 1.017 (ref 1.005–1.030)
pH: 6 (ref 5.0–8.0)

## 2022-10-11 LAB — GLUCOSE, CAPILLARY
Glucose-Capillary: 136 mg/dL — ABNORMAL HIGH (ref 70–99)
Glucose-Capillary: 139 mg/dL — ABNORMAL HIGH (ref 70–99)
Glucose-Capillary: 145 mg/dL — ABNORMAL HIGH (ref 70–99)
Glucose-Capillary: 147 mg/dL — ABNORMAL HIGH (ref 70–99)
Glucose-Capillary: 173 mg/dL — ABNORMAL HIGH (ref 70–99)
Glucose-Capillary: 60 mg/dL — ABNORMAL LOW (ref 70–99)
Glucose-Capillary: 98 mg/dL (ref 70–99)

## 2022-10-11 LAB — MAGNESIUM: Magnesium: 1.9 mg/dL (ref 1.7–2.4)

## 2022-10-11 LAB — PHOSPHORUS: Phosphorus: 2.5 mg/dL (ref 2.5–4.6)

## 2022-10-11 MED ORDER — FREE WATER
120.0000 mL | Freq: Four times a day (QID) | Status: DC
  Administered 2022-10-11 – 2022-10-14 (×13): 120 mL

## 2022-10-11 MED ORDER — DEXTROSE-SODIUM CHLORIDE 5-0.9 % IV SOLN: INTRAVENOUS | Status: DC

## 2022-10-11 MED ORDER — GUAIFENESIN 100 MG/5ML PO LIQD: 5.0000 mL | ORAL | Status: DC | PRN

## 2022-10-11 MED ORDER — JEVITY 1.2 CAL PO LIQD
1000.0000 mL | ORAL | Status: DC
  Administered 2022-10-11 – 2022-10-14 (×3): 1000 mL
  Filled 2022-10-11 (×3): qty 1000

## 2022-10-11 MED ORDER — DEXTROSE 50 % IV SOLN
INTRAVENOUS | Status: AC
  Administered 2022-10-11: 50 mL
  Filled 2022-10-11: qty 50

## 2022-10-11 NOTE — Progress Notes (Signed)
[facility] Patient Note   Current hospice patient followed at home for terminal diagnosis of CVA.  Patient's family called EMS for transport stating problem with PEG.  Patient admitted to [facility] 7.26.24 with dx: problem with PEG and UTI.  Per Dr. Kirt Boys, hospice MD, this is a related hospice admission.  MSW visited with patient at bedside. Patient lying in bed with eyes open but unable to engage in conversation nor does patient acknowledge MSW presence. Son at bedside reports that he has no concerns for MSW and reports that patient did 'well' last night. MSW also shared report with floor RN. Family  wish to continue current POC.   Patient is appropriate for GIP level of care for symptom management of pain and requires skilled assessment to monitor effects of treatment plan.  Intake/Output Summary (Last 24 hours) at 10/11/2022 1537 Last data filed at 10/11/2022 1500 Gross per 24 hour  Intake 1114.49 ml  Output --  Net 1114.49 ml   Abnormal Labs:    Latest Reference Range & Units 10/11/22 03:03  Sodium 135 - 145 mmol/L 130 (L)  Calcium 8.9 - 10.3 mg/dL 8.1 (L)  Albumin 3.5 - 5.0 g/dL 2.6 (L)  Total Protein 6.5 - 8.1 g/dL 5.8 (L)  WBC 4.0 - 76.2 K/uL 3.4 (L)  RBC 4.22 - 5.81 MIL/uL 2.91 (L)  Hemoglobin 13.0 - 17.0 g/dL 83.1 (L)  HCT 51.7 - 61.6 % 28.4 (L)  MCH 26.0 - 34.0 pg 34.4 (H)   Vital Signs: Vitals:   10/11/22 0748 10/11/22 1503  BP: 99/66 100/66  Pulse: 72 86  Resp:    Temp: 97.6 F (36.4 C) 98 F (36.7 C)  SpO2: 100% 100%   IV Medications: ampicillin-sulbactam (UNASYN) 1.5 g in sodium chloride 0.9 % 100 mL IVPB Dose: 1.5 g Freq: Every 8 hours Route: IV Last Dose: 1.5 g (10/11/22 1253) Start: 10/10/22 2200  Order specific questions:       2138     0512  1253   2200      dextrose 5 %-0.9 % sodium chloride infusion Rate: 50 mL/hr Freq: Continuous Route: IV Start: 10/11/22 0615      0539      feeding supplement  (JEVITY 1.2 CAL) liquid 1,000 mL Dose: 1000 mL Freq: Continuous Route: PER TUBE Start: 10/11/22 1200         Assessment/Plan 1. Gastrostomy tube malfunction  - Pt is dependent on feeding tube which had leakage around it for 3 days and could not be flushed last night at home  - ED personnel unable to get tube to function  - Consult IR, continue IVF hydration for now  - Patient had PEG replaced today by IR.  Son instructed on using new tube.   2. UTI  - Hx of ESBL in urine  - Urine cultured in ED, will treat with meropenem for now, follow culture   3. Hyponatremia  - Serum sodium is 125 on admission in setting of hypovolemia  - Continue IVF hydration and repeat chem panel in am    Discharge Planning:   Ongoing- after finishes antibiotics for UTI  Family contact:   Son at bedside updated.  IDT:   Updated   Goals of Care: Family wishes to continue current POC.   Should patient need ambulance transfer at discharge- please use [facility]) as they contract this service for our active hospice patients.   Eugenie Birks, MSW [facility]

## 2022-10-11 NOTE — Progress Notes (Signed)
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  Patient's family noted leakage around the entry site of the feeding tube the last 3 days and son notes that he is unable to flush the tube the night before last or the day before that.  Family noted the patient appeared to be in pain grabbing lower abdomen.  Because of this they brought him to the ED and he was found to be afebrile and slightly tachycardic with a soft blood pressure.  Labs were notable for hyponatremia and hypoalbuminemia.  Urinalysis notable for bacteriuria, pyuria and positive nitrites.  Urine culture was obtained in the ED and he was given 1.5 L normal saline bolus and given some morphine and Rocephin.  Antibiotics were changed to IV meropenem.  His G-tube was changed by the interventional radiology team.  Further workup was revealed that he has an aspiration pneumonia.  Palliative care has been consulted for further goals of care discussion as well as the hospice team.  Patient is doing better today and not as rhonchorous.  Getting Unasyn and meropenem.  Urine culture showing multiple species so we will recollect.  Chest x-ray showed improvement and showed no active disease.  Assessment and Plan:  Gastrostomy tube malfunction  - Pt is dependent on feeding tube which had leakage around it for 3 days and could not be flushed last night at home   ED personnel unable to get tube to function  -Consult IR, continue IVF hydration for now  -IR was able to replace G tube at bedside; tube feedings have been resumed and he was little hypoglycemic overnight given that the  tube feedings had not been resumed at that time  Aspiration Pneumonia -Will also add IV Unasyn in Addition to the Meropenem -Elevate Head of Bed -Suction as needed -Aspiration Precautions and continue NPO -Xopenex/Atrovent -Repeat CXR in the AM and this morning's chest x-ray showed no active disease   UTI  -Hx of ESBL in urine  -Urinalysis done and showed a hazy appearance with large leukocytes, positive nitrites, negative protein, few bacteria, 0-5 RBCs per high-power field, 21-50 WBCs and urine culture that showed multiple species so this is will be recollected -CT scan of the abdomen pelvis done that showed "Gastrostomy tube balloon within the body of the stomach. Some skin thickening and loss of subcutaneous soft tissues at the stoma, correlate with direct inspection. No rim enhancing fluid collections to suggest an abscess. Findings suggesting infectious or inflammatory bronchiolitis versus aspiration at the right lower lobe and right middle lobe. Diffuse bladder wall thickening, question cystitis. Mild diffuse prominence of pancreatic duct measuring up to 4 mm. No obvious mass. No inflammation. Small hiatal hernia." -Urine cultured in ED, will treat with meropenem for now, follow culture on repeat   Hyponatremia  -Serum sodium is 125 on admission in setting of hypovolemia  -Na+ Trend: Recent Labs  Lab 10/09/22 1422 10/10/22 0644 10/11/22 0303  NA 125* 130* 130*  -Discontinued IV fluid hydration and resume home tube feedings -Continue to monitor and trend and repeat CMP in the a.m.  Metabolic Acidosis -Mild. CO2 is now 19, AG of 6, and Chloride  and Level of 105 -Continue to Monitor and Trend and repeat CMP in the AM  Normocytic Anemia -Hgb/Hct Trend: Recent Labs  Lab 10/09/22 1422 10/10/22 0644 10/11/22 0303  HGB 9.0* 9.2* 10.0*  HCT 26.3* 26.5* 28.4*  MCV 95.6 94.6 97.6  -Check Anemia Panel in the AM -Continue to Monitor for S/Sx of Bleeding; No overt bleeding  noted -Repeat CBC in the AM   Hypoalbuminemia -Patient's Albumin Trend: Recent Labs  Lab 10/09/22 1422 10/11/22 0303  ALBUMIN 2.6* 2.6*  -Continue to Monitor and Trend and repeat CMP in the AM  Failure to Thrive GOC Cachectic and Underweight -Estimated body mass index is 16.45 kg/m as calculated from the following:   Height as of 04/24/22: 5\' 1"  (1.549 m).   Weight as of this encounter: 39.5 kg. -Patient is a Hospice Patient -Palliative Consulted for GOC discussion patient's family wants to continue present treatment inclusive of antibiotics and IV fluids with clear wishes to treat the treatable; continuing antibiotics and await repeat urine culture result   DVT prophylaxis: enoxaparin (LOVENOX) injection 30 mg Start: 10/11/22 1000    Code Status: DNR Family Communication: Discussed with the patient's son at bedside  Disposition Plan:  Level of care: Med-Surg Status is: Inpatient Remains inpatient appropriate because: Needs further clinical improvement and anticipating discharge in the next 24 to 48 hours if respiratory status is stable and will discuss with ID about possible discharge antibiotics in the AM   Consultants:  None  Procedures:  As delineated as above  Antimicrobials:  Anti-infectives (From admission, onward)    Start     Dose/Rate Route Frequency Ordered Stop   10/10/22 2200  ampicillin-sulbactam (UNASYN) 1.5 g in sodium chloride 0.9 % 100 mL IVPB        1.5 g 200 mL/hr over 30 Minutes Intravenous Every 8 hours 10/10/22 2031     10/09/22 2315  meropenem (MERREM) 1 g in sodium chloride 0.9 % 100 mL IVPB  Status:  Discontinued        1 g 200 mL/hr over 30 Minutes Intravenous Every 12 hours 10/09/22 2306 10/10/22 2031   10/09/22 2245  meropenem (MERREM) 1 g in sodium chloride 0.9 % 100 mL IVPB  Status:  Discontinued        1 g 200 mL/hr over 30 Minutes Intravenous Every 8 hours 10/09/22 2241 10/09/22 2306   10/09/22 2030  azithromycin (ZITHROMAX) 500 mg in  sodium chloride 0.9 % 250 mL IVPB  Status:  Discontinued        500 mg 250 mL/hr over 60 Minutes Intravenous  Once 10/09/22 2020 10/09/22 2020   10/09/22 1915  cefTRIAXone (ROCEPHIN) 1 g in sodium chloride 0.9 % 100 mL IVPB        1 g 200 mL/hr over 30 Minutes Intravenous  Once 10/09/22 1907 10/09/22 2055       Subjective: Seen and examined at bedside and is more awake today but unable to participate and subjective history.  Son thinks that he is doing much better overnight.  Continues to have significant fractures.  Does not appear to be in any acute distress.  Objective: Vitals:   10/11/22 0457 10/11/22 0500 10/11/22 0748 10/11/22 1503  BP: 95/69  99/66 100/66  Pulse: 90  72 86  Resp: 20     Temp: 97.9 F (36.6 C)  97.6 F (36.4 C) 98 F (36.7 C)  TempSrc: Oral  Oral Oral  SpO2: 99%  100% 100%  Weight:  39.5 kg  Intake/Output Summary (Last 24 hours) at 10/11/2022 1751 Last data filed at 10/11/2022 1544 Gross per 24 hour  Intake 1114.49 ml  Output 450 ml  Net 664.49 ml   Filed Weights   10/10/22 0500 10/11/22 0500  Weight: 39.4 kg 39.5 kg   Examination: Physical Exam:  Constitutional: Thin chronically ill-appearing cachectic B knees male who is significantly contracted and nonverbal but has his eyes open Respiratory: Diminished to auscultation bilaterally with coarse breath sounds and rhonchi is improved.  Not wearing supplemental oxygen via nasal cannula, Cardiovascular: RRR, no murmurs / rubs / gallops. S1 and S2 auscultated. No extremity edema.  Abdomen: Soft, non-tender, non-distended.  PEG tube is in place. Bowel sounds positive.  GU: Deferred.  Catheter is in place Musculoskeletal: Has significant contractures Neurologic: Nonverbal and he is bedbound and does not participate in examination or follow commands but his eyes are open  Data Reviewed: I have personally reviewed following labs and imaging studies  CBC: Recent Labs  Lab 10/09/22 1422  10/10/22 0644 10/11/22 0303  WBC 5.5 4.8 3.4*  NEUTROABS 3.7  --  2.1  HGB 9.0* 9.2* 10.0*  HCT 26.3* 26.5* 28.4*  MCV 95.6 94.6 97.6  PLT 299 290 270   Basic Metabolic Panel: Recent Labs  Lab 10/09/22 1422 10/10/22 0644 10/11/22 0303  NA 125* 130* 130*  K 4.3 4.2 4.1  CL 100 105 105  CO2 19* 19* 18*  GLUCOSE 89 77 88  BUN 17 13 13   CREATININE 0.69 0.67 0.77  CALCIUM 7.5* 8.2* 8.1*  MG  --   --  1.9  PHOS  --   --  2.5   GFR: Estimated Creatinine Clearance: 44.6 mL/min (by C-G formula based on SCr of 0.77 mg/dL). Liver Function Tests: Recent Labs  Lab 10/09/22 1422 10/11/22 0303  AST 29 29  ALT 29 27  ALKPHOS 53 63  BILITOT 0.7 0.7  PROT 5.7* 5.8*  ALBUMIN 2.6* 2.6*   Recent Labs  Lab 10/09/22 1422  LIPASE 50   No results for input(s): "AMMONIA" in the last 168 hours. Coagulation Profile: No results for input(s): "INR", "PROTIME" in the last 168 hours. Cardiac Enzymes: No results for input(s): "CKTOTAL", "CKMB", "CKMBINDEX", "TROPONINI" in the last 168 hours. BNP (last 3 results) No results for input(s): "PROBNP" in the last 8760 hours. HbA1C: No results for input(s): "HGBA1C" in the last 72 hours. CBG: Recent Labs  Lab 10/11/22 0449 10/11/22 0552 10/11/22 0750 10/11/22 1135 10/11/22 1653  GLUCAP 60* 173* 139* 98 145*   Lipid Profile: No results for input(s): "CHOL", "HDL", "LDLCALC", "TRIG", "CHOLHDL", "LDLDIRECT" in the last 72 hours. Thyroid Function Tests: No results for input(s): "TSH", "T4TOTAL", "FREET4", "T3FREE", "THYROIDAB" in the last 72 hours. Anemia Panel: No results for input(s): "VITAMINB12", "FOLATE", "FERRITIN", "TIBC", "IRON", "RETICCTPCT" in the last 72 hours. Sepsis Labs: Recent Labs  Lab 10/09/22 2041  LATICACIDVEN 0.4*    Recent Results (from the past 240 hour(s))  Urine Culture (for pregnant, neutropenic or urologic patients or patients with an indwelling urinary catheter)     Status: Abnormal   Collection Time:  10/09/22  2:22 PM   Specimen: Urine, Clean Catch  Result Value Ref Range Status   Specimen Description URINE, CLEAN CATCH  Final   Special Requests   Final    NONE Performed at [facility], [address] [place], [place] [address]    Culture MULTIPLE SPECIES PRESENT, SUGGEST RECOLLECTION (A)  Final   Report Status 10/10/2022  FINAL  Final    Radiology Studies: DG CHEST PORT 1 VIEW  Result Date: 10/11/2022 CLINICAL DATA:  Shortness of breath. EXAM: PORTABLE CHEST 1 VIEW COMPARISON:  10/28/2021 FINDINGS: Heart size is normal. Stable tortuosity of thoracic aorta. Both lungs are clear. IMPRESSION: No active disease. Electronically Signed   By: Danae Orleans M.D.   On: 10/11/2022 10:53   CT ABDOMEN PELVIS W CONTRAST  Result Date: 10/09/2022 CLINICAL DATA:  Leaking feeding tube abdomen pain EXAM: CT ABDOMEN AND PELVIS WITH CONTRAST TECHNIQUE: Multidetector CT imaging of the abdomen and pelvis was performed using the standard protocol following bolus administration of intravenous contrast. RADIATION DOSE REDUCTION: This exam was performed according to the departmental dose-optimization program which includes automated exposure control, adjustment of the mA and/or kV according to patient size and/or use of iterative reconstruction technique. CONTRAST:  60mL OMNIPAQUE IOHEXOL 350 MG/ML SOLN COMPARISON:  CT 07/04/2021 FINDINGS: Lower chest: Lung bases demonstrate centrilobular density in the right middle and lower lobes. There is mucous plugging or debris within right lower lobe bronchi. No consolidation or effusion. Small hiatal hernia Hepatobiliary: Contracted gallbladder. No calcified stone or biliary dilatation. Subcentimeter hypodensity within the central liver too small to further characterize. Pancreas: No inflammation. Mild diffuse prominence of pancreatic duct measuring up to 4 mm. Spleen: Normal in size without focal abnormality. Adrenals/Urinary Tract: Adrenal glands are within normal  limits. Bilateral renal cysts for which no imaging follow-up is recommended. There is no hydronephrosis. Catheter within the bladder. Diffuse bladder wall thickening. Bladder is decompressed Stomach/Bowel: Stomach nondistended. Gastrostomy tube balloon within the body of the stomach. Some skin thickening and loss of subcutaneous soft tissues at the stoma. No rim enhancing fluid collections to suggest an abscess. No obstruction. Decompressed small bowel. No acute bowel wall thickening. Vascular/Lymphatic: Nonaneurysmal aorta. Mild atherosclerosis. No aneurysm. Reproductive: Negative for mass. Metallic clips in the region of the prostate. Other: Negative for pelvic effusion or free air. Musculoskeletal: Scoliosis and degenerative changes of the spine. No acute osseous abnormality. IMPRESSION: 1. Gastrostomy tube balloon within the body of the stomach. Some skin thickening and loss of subcutaneous soft tissues at the stoma, correlate with direct inspection. No rim enhancing fluid collections to suggest an abscess. 2. Findings suggesting infectious or inflammatory bronchiolitis versus aspiration at the right lower lobe and right middle lobe. 3. Diffuse bladder wall thickening, question cystitis. 4. Mild diffuse prominence of pancreatic duct measuring up to 4 mm. No obvious mass. No inflammation 5. Small hiatal hernia. Electronically Signed   By: Jasmine Pang M.D.   On: 10/09/2022 20:14    Scheduled Meds:  enoxaparin (LOVENOX) injection  30 mg Subcutaneous Daily   free water  120 mL Per Tube Q6H   ipratropium  0.5 mg Nebulization BID   levalbuterol  0.63 mg Nebulization BID   Continuous Infusions:  ampicillin-sulbactam (UNASYN) IV 1.5 g (10/11/22 1253)   feeding supplement (JEVITY 1.2 CAL)      LOS: 1 day   Marguerita Merles, DO [facility] Available via Epic secure chat 7am-7pm After these hours, please refer to coverage provider listed on [email or web address] 10/11/2022, 5:51 PM

## 2022-10-11 NOTE — Progress Notes (Signed)
Hypoglycemic Event  CBG: 60  Treatment: D50 50 mL (25 gm)  Symptoms: None  Follow-up CBG: Time:0552    CBG Result:173  Possible Reasons for Event: Inadequate meal intake and Other: pending tube feeding regimen  Comments/MD notified:A. Virgel Manifold NP Patient IVF changed to D5% NSS    Jamal Pavon R Kianni Lheureux

## 2022-10-11 NOTE — Progress Notes (Signed)
Hypoglycemic Event  CBG: 48  Treatment: D50 50 mL (25 gm)  Symptoms: None  Follow-up CBG: Time:2251 CBG Result:186  Possible Reasons for Event: Other:   NPO, pending tube feeding regimen,   Comments/MD notified:A. [employer] R Billal Rollo

## 2022-10-11 NOTE — Plan of Care (Signed)
  Problem: Clinical Measurements: Goal: Ability to maintain clinical measurements within normal limits will improve Outcome: Progressing Goal: Cardiovascular complication will be avoided Outcome: Progressing   Problem: Nutrition: Goal: Adequate nutrition will be maintained Outcome: Not Progressing   Problem: Elimination: Goal: Will not experience complications related to bowel motility Outcome: Progressing   Problem: Skin Integrity: Goal: Risk for impaired skin integrity will decrease Outcome: Progressing

## 2022-10-12 ENCOUNTER — Inpatient Hospital Stay (HOSPITAL_COMMUNITY): Payer: Medicare Other

## 2022-10-12 DIAGNOSIS — N3000 Acute cystitis without hematuria: Secondary | ICD-10-CM | POA: Diagnosis not present

## 2022-10-12 DIAGNOSIS — E43 Unspecified severe protein-calorie malnutrition: Secondary | ICD-10-CM | POA: Insufficient documentation

## 2022-10-12 DIAGNOSIS — J690 Pneumonitis due to inhalation of food and vomit: Secondary | ICD-10-CM | POA: Diagnosis not present

## 2022-10-12 DIAGNOSIS — E871 Hypo-osmolality and hyponatremia: Secondary | ICD-10-CM | POA: Diagnosis not present

## 2022-10-12 DIAGNOSIS — K9423 Gastrostomy malfunction: Secondary | ICD-10-CM | POA: Diagnosis not present

## 2022-10-12 DIAGNOSIS — F03B0 Unspecified dementia, moderate, without behavioral disturbance, psychotic disturbance, mood disturbance, and anxiety: Secondary | ICD-10-CM | POA: Diagnosis not present

## 2022-10-12 LAB — GLUCOSE, CAPILLARY
Glucose-Capillary: 110 mg/dL — ABNORMAL HIGH (ref 70–99)
Glucose-Capillary: 123 mg/dL — ABNORMAL HIGH (ref 70–99)
Glucose-Capillary: 126 mg/dL — ABNORMAL HIGH (ref 70–99)
Glucose-Capillary: 126 mg/dL — ABNORMAL HIGH (ref 70–99)
Glucose-Capillary: 131 mg/dL — ABNORMAL HIGH (ref 70–99)
Glucose-Capillary: 131 mg/dL — ABNORMAL HIGH (ref 70–99)
Glucose-Capillary: 142 mg/dL — ABNORMAL HIGH (ref 70–99)

## 2022-10-12 MED ORDER — MAGNESIUM SULFATE 2 GM/50ML IV SOLN
2.0000 g | Freq: Once | INTRAVENOUS | Status: AC
  Administered 2022-10-12: 2 g via INTRAVENOUS
  Filled 2022-10-12: qty 50

## 2022-10-12 MED ORDER — SODIUM PHOSPHATES 45 MMOLE/15ML IV SOLN
30.0000 mmol | Freq: Once | INTRAVENOUS | Status: AC
  Administered 2022-10-12: 30 mmol via INTRAVENOUS
  Filled 2022-10-12 (×2): qty 10

## 2022-10-12 MED ORDER — CHLORHEXIDINE GLUCONATE CLOTH 2 % EX PADS
6.0000 | MEDICATED_PAD | Freq: Every day | CUTANEOUS | Status: DC
  Administered 2022-10-12 – 2022-10-14 (×3): 6 via TOPICAL
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
## 2022-10-12 NOTE — Progress Notes (Signed)
Initial Nutrition Assessment  DOCUMENTATION CODES:   Severe malnutrition in context of chronic illness, Underweight  INTERVENTION:  Continue tube feeding via PEG: Jevity 1.2 at  32ml/hr ( per day) Free water flushes q6h  Provides 1584kcal, 73g protein, free water daily  Reached out to Case Manager and Hospice liaison regarding home tube feeding and son's desire for a pump for continuous tube feeds  NUTRITION DIAGNOSIS:   Severe Malnutrition related to chronic illness (subdural hematoma, dementia, PEG tube dependent) as evidenced by severe fat depletion, severe muscle depletion.  GOAL:   Patient will meet greater than or equal to 90% of their needs  MONITOR:   Weight trends, TF tolerance, Labs  REASON FOR ASSESSMENT:   Other (Comment) (on TF via PEG tube)    ASSESSMENT:   Pt admitted from home d/t PEG tube malfunctions. PMH significant for chronic subdural hematoma, dementia, non-verbal, bed bound and severely contracted.  7/27 - s/p G tube replacement   Remains admitted for medical management of UTI and PNA.   Spoke with pt's son at bedside. Declines use of interpreter. He reports that he continues with bolus feedings at home QID. Uncertain which formula he has been using at home (osmolite versus jevity). He mentions that they were pursuing a pump for continuous nutrition however they have not received one yet. Pt is followed by Hospice outpatient. Discussed with Case Manager and Hospice liaison.   Pt's son mentions he wishes that the pt would be able to eat regular foods such as a burger and pasta. He reports sometimes trying to provide soup for pt. Reiterated the importance of strict NPO to avoid aspiration and choking. Pt's son expresses understanding of this.   Unfortunately, there is limited documentation of weight history to reviewed within the last year. Within the last 5 months, pt's weight appears fairly stable. Will continue to monitor  throughout admission.   Medications reviewed  Labs: sodium 131, Phos 2.2 (repletion ordered), CBG's 98-145 x24 hours  NUTRITION - FOCUSED PHYSICAL EXAM:  Flowsheet Row Most Recent Value  Orbital Region Severe depletion  Upper Arm Region Severe depletion  Thoracic and Lumbar Region Severe depletion  Buccal Region Severe depletion  Temple Region Severe depletion  Clavicle Bone Region Severe depletion  Clavicle and Acromion Bone Region Severe depletion  Scapular Bone Region Severe depletion  Dorsal Hand Severe depletion  Patellar Region Severe depletion  Anterior Thigh Region Severe depletion  Posterior Calf Region Severe depletion  Edema (RD Assessment) None  Hair Reviewed  Eyes Unable to assess  Mouth Reviewed  Skin Reviewed  Nails Reviewed       Diet Order:   Diet Order             Diet NPO time specified  Diet effective now                   EDUCATION NEEDS:   Education needs have been addressed  Skin:  Skin Assessment: Reviewed RN Assessment  Last BM:  7/26  Height:   Ht Readings from Last 1 Encounters:  04/24/22 5\' 1"  (1.549 m)    Weight:   Wt Readings from Last 1 Encounters:  10/12/22 40 kg   BMI:  Body mass index is 16.66 kg/m.  Estimated Nutritional Needs:   Kcal:  1400-1600  Protein:  65-75g  Fluid:  1.4-1.6L  Drusilla Kanner, RDN, LDN Clinical Nutrition

## 2022-10-12 NOTE — Progress Notes (Signed)
[facility] RM# [id number]  - [facility] Hospitalized Hospice Patient Note   Current hospice patient followed at home for terminal diagnosis of CVA.  Patient's family called EMS for transport stating problem with PEG.  Patient admitted to [facility] 7.26.24 with dx: problem with PEG and UTI.  Per Dr. Kirt Boys, hospice MD, this is a related hospice admission.   Checked in with bedside RN who reported that patient is being treated for pneumonia and UTI with IV Antibiotics at this time, patient is tolerating TF well and has rested comfortably during this shift. Also stating "No plans for discharge today but possibly midweek"   Visited patient in room with son at bedside. Patient was resting with eyes closed in NAD. Patient was not responsive to verbal or tactile stimulation at this time. Supported family member and made aware that Marcell Anger will continue to follow daily until hospital discharge.   Patient is appropriate for inpatient level of care for management of current symptom management of pain which requires frequent assessment of skilled medical staff.   V/S:  98.2, 69, 18, 93/70, 98% sat on 2 L O2 via [place] I&O: 626.1/900 (-273.9) Abnormal lab work: glucose 110, RBC 2.82, Hemo 9.4, HCT 27.1, Na 131, CO2 20, Cal 8.2 Diagnostics:  Chest DG on 7/29 shows mild elevation of the rt hemidiaphragm - no acute findings IVs: Unasyn 1.5g in NS IVPB q8hrs adm last 1229 on 7/29, jevity 1.2 cal adm at 0525 on 7/29, Na Phos in D5 IV adm 1214 on 7/29 No PRNs adm today   MD EPIC Problem list:  Assessment and Plan:  Gastrostomy tube malfunction - Pt is dependent on feeding tube which had leakage around it for 3 days and could not be flushed last night at home, ED personnel unable to get tube to function  -Consult IR, continue IVF hydration for now, -IR was able to replace G tube at bedside; tube feedings have been resumed and he was little hypoglycemic overnight given that the tube  feedings had not been resumed at that time   Aspiration Pneumonia -Will also add IV Unasyn in Addition to the Meropenem-Elevate Head of Bed-Suction as needed-Aspiration Precautions and continue NPO-Xopenex/Atrovent-Repeat CXR in the AM and this morning's chest x-ray showed no active disease   UTI  -Hx of ESBL in urine -Urinalysis done and showed a hazy appearance with large leukocytes, positive nitrites, negative protein, few bacteria, 0-5 RBCs per high-power field, 21-50 WBCs and urine culture that showed multiple species so this is will be recollected-CT scan of the abdomen pelvis done that showed "Gastrostomy tube balloon within the body of the stomach. Some skin thickening and loss of subcutaneous soft tissues at the stoma, correlate with direct inspection. No rim enhancing fluid collections to suggest an abscess. Findings suggesting infectious or inflammatory bronchiolitis versus aspiration at the right lower lobe and right middle lobe. Diffuse bladder wall thickening, question cystitis. Mild diffuse prominence of pancreatic duct measuring up to 4 mm. No obvious mass. No inflammation. Small hiatal hernia."-Urine cultured in ED, will treat with meropenem for now, follow culture on repeat   Hyponatremia  -Serum sodium is 125 on admission in setting of hypovolemia -Na+        -Discontinued IV fluid hydration and resume home tube feedings -Continue to monitor and trend and repeat CMP in the a.m.   Metabolic Acidosis -Mild. CO2 is now 19, AG of 6, and Chloride and Level of 105 -Continue to Monitor and Trend  and repeat CMP in the AM -Check Anemia Panel in the AM-Continue to Monitor for S/Sx of Bleeding; No overt bleeding noted -Repeat CBC in the AM    Hypoalbuminemia - -Continue to Monitor and Trend and repeat CMP in the AM   Failure to Thrive GOCCachectic and Underweight-Estimated body mass index is 16.45 kg/m as calculated from the following:   Height as of 04/24/22: 5\' 1"  (1.549 m).   Weight as of this encounter: 39.5 kg.-Patient is a Hospice Patient -Palliative Consulted for GOC discussion patient's family wants to continue present treatment inclusive of antibiotics and IV fluids with clear wishes to treat the treatable; continuing antibiotics and await repeat urine culture result    Per MD Notes Disposition Plan:  Remains inpatient appropriate because: Needs further clinical improvement and anticipating discharge in the next 24 to 48 hours if respiratory status is stable and will discuss with ID about possible discharge antibiotics in the AM.  Please use [facility] for all [facility] patient needs.  Goals of Care: Clear - patient is a DNR and Patient family confirmed wishes to stay on Hospice Services upon discharge   Communication with IDT- Updated [facility] team Communication with PCG- Supported at bedside. Family has [facility] contact information if hospice concerns/issues arise   Please call with any hospice related questions/concerns,   Roda Shutters, RN [facility] Liaison (in [place]) [phone]

## 2022-10-12 NOTE — Plan of Care (Signed)
  Problem: Nutrition: Goal: Adequate nutrition will be maintained Outcome: Progressing   Problem: Coping: Goal: Level of anxiety will decrease Outcome: Progressing   Problem: Elimination: Goal: Will not experience complications related to urinary retention Outcome: Progressing   Problem: Pain Managment: Goal: General experience of comfort will improve Outcome: Progressing   Problem: Safety: Goal: Ability to remain free from injury will improve Outcome: Progressing   

## 2022-10-12 NOTE — Plan of Care (Signed)
  Problem: Education: Goal: Knowledge of General Education information will improve Description Including pain rating scale, medication(s)/side effects and non-pharmacologic comfort measures Outcome: Progressing   Problem: Health Behavior/Discharge Planning: Goal: Ability to manage health-related needs will improve Outcome: Progressing   

## 2022-10-13 DIAGNOSIS — J690 Pneumonitis due to inhalation of food and vomit: Secondary | ICD-10-CM | POA: Diagnosis not present

## 2022-10-13 DIAGNOSIS — F03B0 Unspecified dementia, moderate, without behavioral disturbance, psychotic disturbance, mood disturbance, and anxiety: Secondary | ICD-10-CM | POA: Diagnosis not present

## 2022-10-13 DIAGNOSIS — E43 Unspecified severe protein-calorie malnutrition: Secondary | ICD-10-CM

## 2022-10-13 DIAGNOSIS — E871 Hypo-osmolality and hyponatremia: Secondary | ICD-10-CM | POA: Diagnosis not present

## 2022-10-13 DIAGNOSIS — K9423 Gastrostomy malfunction: Secondary | ICD-10-CM | POA: Diagnosis not present

## 2022-10-13 LAB — GLUCOSE, CAPILLARY
Glucose-Capillary: 101 mg/dL — ABNORMAL HIGH (ref 70–99)
Glucose-Capillary: 127 mg/dL — ABNORMAL HIGH (ref 70–99)
Glucose-Capillary: 126 mg/dL — ABNORMAL HIGH (ref 70–99)
Glucose-Capillary: 113 mg/dL — ABNORMAL HIGH (ref 70–99)
Glucose-Capillary: 119 mg/dL — ABNORMAL HIGH (ref 70–99)

## 2022-10-13 MED ORDER — AMOXICILLIN-POT CLAVULANATE 600-42.9 MG/5ML PO SUSR: 875.0000 mg | Freq: Two times a day (BID) | ORAL | 0 refills | Status: DC

## 2022-10-13 MED ORDER — ONDANSETRON HCL 4 MG PO TABS: 4.0000 mg | ORAL_TABLET | Freq: Four times a day (QID) | ORAL | 0 refills | Status: DC | PRN

## 2022-10-13 MED ORDER — AMOXICILLIN-POT CLAVULANATE 600-42.9 MG/5ML PO SUSR
875.0000 mg | Freq: Two times a day (BID) | ORAL | Status: DC
  Administered 2022-10-13 – 2022-10-14 (×3): 875 mg
  Filled 2022-10-13 (×4): qty 7.3

## 2022-10-13 MED ORDER — ACETAMINOPHEN 325 MG PO TABS: 650.0000 mg | ORAL_TABLET | Freq: Four times a day (QID) | ORAL | 0 refills | Status: DC | PRN
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
## 2022-10-13 NOTE — TOC Transition Note (Incomplete)
Transition of Care [facility]) - CM/SW Discharge Note   Patient Details  Name: [name] MRN: [id number] Date of Birth: 1945/06/23  [facility] CM/SW Contact:  Epifanio Lesches, RN Phone Number: 10/13/2022, 1:40 PM   Clinical Narrative:    Patient will DC to: home Anticipated DC date: 10/13/2022 Family notified: yes Transport by: Sharin Mons   Per MD patient ready for DC to . RN, patient, patient's family, and facility notified of DC. Discharge Summary and FL2 sent to facility. RN to call report prior to discharge (). DC packet on chart. Ambulance transport requested for patient.   RNCM will sign off for now as intervention is no longer needed. Please consult [name] again if new needs arise.   Final next level of care: Home w Hospice Care Barriers to Discharge: No Barriers Identified   Patient Goals and CMS Choice      Discharge Placement                         Discharge Plan and Services Additional resources added to the After Visit Summary for                                       Social Determinants of Health (SDOH) Interventions SDOH Screenings   Tobacco Use: Medium Risk (10/09/2022)     Readmission Risk Interventions     No data to display

## 2022-10-13 NOTE — Care Management Important Message (Signed)
Important Message  Patient Details  Name: [name] MRN: [id number] Date of Birth: November 02, 1945   Medicare Important Message Given:  Yes     Sherilyn Banker 10/13/2022, 4:30 PM

## 2022-10-14 LAB — GLUCOSE, CAPILLARY
Glucose-Capillary: 115 mg/dL — ABNORMAL HIGH (ref 70–99)
Glucose-Capillary: 122 mg/dL — ABNORMAL HIGH (ref 70–99)
Glucose-Capillary: 80 mg/dL (ref 70–99)
Glucose-Capillary: 86 mg/dL (ref 70–99)
Glucose-Capillary: 90 mg/dL (ref 70–99)

## 2022-10-14 NOTE — TOC Transition Note (Addendum)
Transition of Care [facility]) - CM/SW Discharge Note   Patient Details  Name: [name] MRN: [id number] Date of Birth: 1945/12/11  [facility]) CM/SW Contact:  Epifanio Lesches, RN Phone Number: 10/14/2022, 2:52 PM   Clinical Narrative:    Patient will DC to: Home Anticipated DC date: 10/14/2022 Family notified: yes Transport by: car  Per MD patient ready for DC today with the resumption of home hospice care. RN, patient, patient's family ( son/daughter), and Art [facility] manager Hospice notified of DC.   [facility]/ambulance [phone]) services requested for patient's transportation to home. Son @ bedside has key to get into home however can't accompany pt 's ride to home in ambulance. Daughter states will be @ bedside by 1600 to pick up brother.     Arrangements made with [facility] for pt pickup @ 1630.  Transportation papers/DNR on front of chart.  RNCM will sign off for now as intervention is no longer needed. Please consult [name] again if new needs arise.   Final next level of care: Home w Hospice Care Barriers to Discharge: No Barriers Identified   Patient Goals and CMS Choice      Discharge Placement                         Discharge Plan and Services Additional resources added to the After Visit Summary for                                       Social Determinants of Health (SDOH) Interventions SDOH Screenings   Tobacco Use: Medium Risk (10/09/2022)     Readmission Risk Interventions     No data to display

## 2022-10-14 NOTE — Plan of Care (Signed)
  Problem: Clinical Measurements: Goal: Respiratory complications will improve Outcome: Progressing Goal: Cardiovascular complication will be avoided Outcome: Progressing   Problem: Activity: Goal: Risk for activity intolerance will decrease Outcome: Progressing   Problem: Nutrition: Goal: Adequate nutrition will be maintained Outcome: Progressing   Problem: Coping: Goal: Level of anxiety will decrease Outcome: Progressing   Problem: Elimination: Goal: Will not experience complications related to bowel motility Outcome: Progressing

## 2022-10-14 NOTE — Progress Notes (Signed)
[facility] here to transport pt home; pt's children went a little bit ahead of EMS to receive pt home.

## 2022-10-18 ENCOUNTER — Inpatient Hospital Stay (HOSPITAL_COMMUNITY)
Admission: EM | Admit: 2022-10-18 | Discharge: 2022-11-15 | DRG: 871 | Disposition: E | Payer: Medicare Other | Attending: Internal Medicine | Admitting: Internal Medicine

## 2022-10-18 ENCOUNTER — Emergency Department (HOSPITAL_COMMUNITY): Payer: Medicare Other

## 2022-10-18 DIAGNOSIS — N179 Acute kidney failure, unspecified: Secondary | ICD-10-CM | POA: Diagnosis present

## 2022-10-18 DIAGNOSIS — Z86010 Personal history of colonic polyps: Secondary | ICD-10-CM

## 2022-10-18 DIAGNOSIS — F02C0 Dementia in other diseases classified elsewhere, severe, without behavioral disturbance, psychotic disturbance, mood disturbance, and anxiety: Secondary | ICD-10-CM | POA: Diagnosis present

## 2022-10-18 DIAGNOSIS — A4189 Other specified sepsis: Principal | ICD-10-CM | POA: Diagnosis present

## 2022-10-18 DIAGNOSIS — Z79899 Other long term (current) drug therapy: Secondary | ICD-10-CM

## 2022-10-18 DIAGNOSIS — G309 Alzheimer's disease, unspecified: Secondary | ICD-10-CM | POA: Diagnosis present

## 2022-10-18 DIAGNOSIS — J9601 Acute respiratory failure with hypoxia: Secondary | ICD-10-CM | POA: Diagnosis not present

## 2022-10-18 DIAGNOSIS — Z515 Encounter for palliative care: Secondary | ICD-10-CM

## 2022-10-18 DIAGNOSIS — F32A Depression, unspecified: Secondary | ICD-10-CM | POA: Diagnosis not present

## 2022-10-18 DIAGNOSIS — D62 Acute posthemorrhagic anemia: Secondary | ICD-10-CM | POA: Diagnosis present

## 2022-10-18 DIAGNOSIS — R64 Cachexia: Secondary | ICD-10-CM | POA: Diagnosis not present

## 2022-10-18 DIAGNOSIS — Z66 Do not resuscitate: Secondary | ICD-10-CM | POA: Diagnosis not present

## 2022-10-18 DIAGNOSIS — J690 Pneumonitis due to inhalation of food and vomit: Secondary | ICD-10-CM | POA: Diagnosis present

## 2022-10-18 DIAGNOSIS — F419 Anxiety disorder, unspecified: Secondary | ICD-10-CM | POA: Diagnosis not present

## 2022-10-18 DIAGNOSIS — I69398 Other sequelae of cerebral infarction: Secondary | ICD-10-CM

## 2022-10-18 DIAGNOSIS — E43 Unspecified severe protein-calorie malnutrition: Secondary | ICD-10-CM | POA: Diagnosis not present

## 2022-10-18 DIAGNOSIS — J1282 Pneumonia due to coronavirus disease 2019: Secondary | ICD-10-CM | POA: Diagnosis not present

## 2022-10-18 DIAGNOSIS — K920 Hematemesis: Secondary | ICD-10-CM | POA: Diagnosis present

## 2022-10-18 DIAGNOSIS — U071 COVID-19: Secondary | ICD-10-CM

## 2022-10-18 DIAGNOSIS — E875 Hyperkalemia: Secondary | ICD-10-CM | POA: Diagnosis not present

## 2022-10-18 DIAGNOSIS — Z87440 Personal history of urinary (tract) infections: Secondary | ICD-10-CM

## 2022-10-18 DIAGNOSIS — E872 Acidosis, unspecified: Secondary | ICD-10-CM | POA: Diagnosis present

## 2022-10-18 DIAGNOSIS — G9341 Metabolic encephalopathy: Secondary | ICD-10-CM | POA: Diagnosis not present

## 2022-10-18 DIAGNOSIS — Z8546 Personal history of malignant neoplasm of prostate: Secondary | ICD-10-CM

## 2022-10-18 DIAGNOSIS — E871 Hypo-osmolality and hyponatremia: Secondary | ICD-10-CM | POA: Diagnosis present

## 2022-10-18 DIAGNOSIS — R042 Hemoptysis: Secondary | ICD-10-CM | POA: Diagnosis not present

## 2022-10-18 DIAGNOSIS — D649 Anemia, unspecified: Secondary | ICD-10-CM

## 2022-10-18 DIAGNOSIS — A419 Sepsis, unspecified organism: Secondary | ICD-10-CM

## 2022-10-18 DIAGNOSIS — Z681 Body mass index (BMI) 19 or less, adult: Secondary | ICD-10-CM

## 2022-10-18 DIAGNOSIS — R6521 Severe sepsis with septic shock: Secondary | ICD-10-CM | POA: Diagnosis present

## 2022-10-18 DIAGNOSIS — Z7401 Bed confinement status: Secondary | ICD-10-CM

## 2022-10-18 DIAGNOSIS — E860 Dehydration: Secondary | ICD-10-CM | POA: Diagnosis present

## 2022-10-18 DIAGNOSIS — Z931 Gastrostomy status: Secondary | ICD-10-CM | POA: Diagnosis not present

## 2022-10-18 DIAGNOSIS — K922 Gastrointestinal hemorrhage, unspecified: Secondary | ICD-10-CM

## 2022-10-18 DIAGNOSIS — E861 Hypovolemia: Secondary | ICD-10-CM | POA: Diagnosis present

## 2022-10-18 DIAGNOSIS — Z87891 Personal history of nicotine dependence: Secondary | ICD-10-CM

## 2022-10-18 LAB — TYPE AND SCREEN
ABO/RH(D): O POS
Antibody Screen: NEGATIVE
Unit division: 0
Unit division: 0

## 2022-10-18 LAB — CBC WITH DIFFERENTIAL/PLATELET
Abs Immature Granulocytes: 0.08 10*3/uL — ABNORMAL HIGH (ref 0.00–0.07)
Basophils Absolute: 0 10*3/uL (ref 0.0–0.1)
Basophils Relative: 0 %
Eosinophils Absolute: 0 10*3/uL (ref 0.0–0.5)
Eosinophils Relative: 0 %
HCT: 15.8 % — ABNORMAL LOW (ref 39.0–52.0)
Hemoglobin: 5.3 g/dL — CL (ref 13.0–17.0)
Immature Granulocytes: 1 %
Lymphocytes Relative: 5 %
Lymphs Abs: 0.4 10*3/uL — ABNORMAL LOW (ref 0.7–4.0)
MCH: 34 pg (ref 26.0–34.0)
MCHC: 33.5 g/dL (ref 30.0–36.0)
MCV: 101.3 fL — ABNORMAL HIGH (ref 80.0–100.0)
Monocytes Absolute: 0.3 10*3/uL (ref 0.1–1.0)
Monocytes Relative: 5 %
Neutro Abs: 6.2 10*3/uL (ref 1.7–7.7)
Neutrophils Relative %: 89 %
Platelets: 160 10*3/uL (ref 150–400)
RBC: 1.56 MIL/uL — ABNORMAL LOW (ref 4.22–5.81)
RDW: 13 % (ref 11.5–15.5)
WBC: 7 10*3/uL (ref 4.0–10.5)
nRBC: 0 % (ref 0.0–0.2)

## 2022-10-18 LAB — PREPARE RBC (CROSSMATCH)

## 2022-10-18 LAB — COMPREHENSIVE METABOLIC PANEL
ALT: 29 U/L (ref 0–44)
AST: 38 U/L (ref 15–41)
Albumin: 2.4 g/dL — ABNORMAL LOW (ref 3.5–5.0)
Alkaline Phosphatase: 43 U/L (ref 38–126)
Anion gap: 17 — ABNORMAL HIGH (ref 5–15)
BUN: 55 mg/dL — ABNORMAL HIGH (ref 8–23)
CO2: 16 mmol/L — ABNORMAL LOW (ref 22–32)
Calcium: 8.1 mg/dL — ABNORMAL LOW (ref 8.9–10.3)
Chloride: 92 mmol/L — ABNORMAL LOW (ref 98–111)
Creatinine, Ser: 1.42 mg/dL — ABNORMAL HIGH (ref 0.61–1.24)
GFR, Estimated: 52 mL/min — ABNORMAL LOW (ref >60)
Glucose, Bld: 81 mg/dL (ref 70–99)
Potassium: 6.8 mmol/L (ref 3.5–5.1)
Sodium: 125 mmol/L — ABNORMAL LOW (ref 135–145)
Total Bilirubin: 0.4 mg/dL (ref 0.3–1.2)
Total Protein: 5.2 g/dL — ABNORMAL LOW (ref 6.5–8.1)

## 2022-10-18 LAB — CBG MONITORING, ED
Glucose-Capillary: 149 mg/dL — ABNORMAL HIGH (ref 70–99)
Glucose-Capillary: 273 mg/dL — ABNORMAL HIGH (ref 70–99)

## 2022-10-18 LAB — BPAM RBC
Blood Product Expiration Date: 202408292359
Blood Product Expiration Date: 202409062359
ISSUE DATE / TIME: 202408041345
ISSUE DATE / TIME: 202408041356
Unit Type and Rh: 5100
Unit Type and Rh: 5100

## 2022-10-18 LAB — URINALYSIS, W/ REFLEX TO CULTURE (INFECTION SUSPECTED)
Bilirubin Urine: NEGATIVE
Glucose, UA: 150 mg/dL — AB
Ketones, ur: NEGATIVE mg/dL
Nitrite: NEGATIVE
Protein, ur: 30 mg/dL — AB
Specific Gravity, Urine: 1.015 (ref 1.005–1.030)
pH: 5 (ref 5.0–8.0)

## 2022-10-18 LAB — RESP PANEL BY RT-PCR (RSV, FLU A&B, COVID)  RVPGX2
Influenza A by PCR: NEGATIVE
Influenza B by PCR: NEGATIVE
Resp Syncytial Virus by PCR: NEGATIVE
SARS Coronavirus 2 by RT PCR: POSITIVE — AB

## 2022-10-18 LAB — I-STAT CG4 LACTIC ACID, ED
Lactic Acid, Venous: 4.5 mmol/L (ref 0.5–1.9)
Lactic Acid, Venous: 7 mmol/L (ref 0.5–1.9)

## 2022-10-18 MED ORDER — SODIUM CHLORIDE 0.9% IV SOLUTION: Freq: Once | INTRAVENOUS | Status: DC

## 2022-10-18 MED ORDER — NOREPINEPHRINE 4 MG/250ML-% IV SOLN
2.0000 ug/min | INTRAVENOUS | Status: DC
  Administered 2022-10-18: 2 ug/min via INTRAVENOUS
  Filled 2022-10-18: qty 250

## 2022-10-18 MED ORDER — LACTATED RINGERS IV BOLUS (SEPSIS)
1000.0000 mL | Freq: Once | INTRAVENOUS | Status: AC
  Administered 2022-10-18: 1000 mL via INTRAVENOUS

## 2022-10-18 MED ORDER — MORPHINE BOLUS VIA INFUSION: 5.0000 mg | INTRAVENOUS | Status: DC | PRN

## 2022-10-18 MED ORDER — NOREPINEPHRINE 4 MG/250ML-% IV SOLN: 0.0000 ug/min | INTRAVENOUS | Status: DC

## 2022-10-18 MED ORDER — ACETAMINOPHEN 325 MG PO TABS: 650.0000 mg | ORAL_TABLET | Freq: Four times a day (QID) | ORAL | Status: DC | PRN

## 2022-10-18 MED ORDER — PANTOPRAZOLE 80MG IVPB - SIMPLE MED: 80.0000 mg | Freq: Once | INTRAVENOUS | Status: DC

## 2022-10-18 MED ORDER — VANCOMYCIN HCL IN DEXTROSE 1-5 GM/200ML-% IV SOLN
1000.0000 mg | Freq: Once | INTRAVENOUS | Status: AC
  Administered 2022-10-18: 1000 mg via INTRAVENOUS
  Filled 2022-10-18: qty 200

## 2022-10-18 MED ORDER — PIPERACILLIN-TAZOBACTAM 3.375 G IVPB 30 MIN
3.3750 g | Freq: Once | INTRAVENOUS | Status: AC
  Administered 2022-10-18: 3.375 g via INTRAVENOUS
  Filled 2022-10-18: qty 50

## 2022-10-18 MED ORDER — LACTATED RINGERS IV SOLN: INTRAVENOUS | Status: DC

## 2022-10-18 MED ORDER — FENTANYL CITRATE PF 50 MCG/ML IJ SOSY: 25.0000 ug | PREFILLED_SYRINGE | INTRAMUSCULAR | Status: DC | PRN

## 2022-10-18 MED ORDER — FENTANYL CITRATE PF 50 MCG/ML IJ SOSY
25.0000 ug | PREFILLED_SYRINGE | Freq: Once | INTRAMUSCULAR | Status: AC
  Administered 2022-10-18: 25 ug via INTRAVENOUS
  Filled 2022-10-18: qty 1

## 2022-10-18 MED ORDER — PIPERACILLIN-TAZOBACTAM 3.375 G IVPB 30 MIN: 3.3750 g | Freq: Three times a day (TID) | INTRAVENOUS | Status: DC

## 2022-10-18 MED ORDER — ACETAMINOPHEN 650 MG RE SUPP: 650.0000 mg | Freq: Four times a day (QID) | RECTAL | Status: DC | PRN

## 2022-10-18 MED ORDER — CALCIUM GLUCONATE-NACL 1-0.675 GM/50ML-% IV SOLN
1.0000 g | Freq: Once | INTRAVENOUS | Status: AC
  Administered 2022-10-18: 1000 mg via INTRAVENOUS
  Filled 2022-10-18: qty 50

## 2022-10-18 MED ORDER — LACTATED RINGERS IV BOLUS
1000.0000 mL | Freq: Once | INTRAVENOUS | Status: AC
  Administered 2022-10-18: 1000 mL via INTRAVENOUS

## 2022-10-18 MED ORDER — SODIUM ZIRCONIUM CYCLOSILICATE 10 G PO PACK: 10.0000 g | PACK | Freq: Once | ORAL | Status: DC

## 2022-10-18 MED ORDER — DEXTROSE 50 % IV SOLN
1.0000 | Freq: Once | INTRAVENOUS | Status: AC
  Administered 2022-10-18: 50 mL via INTRAVENOUS
  Filled 2022-10-18: qty 50

## 2022-10-18 MED ORDER — PANTOPRAZOLE SODIUM 40 MG IV SOLR: 40.0000 mg | Freq: Two times a day (BID) | INTRAVENOUS | Status: DC

## 2022-10-18 MED ORDER — NOREPINEPHRINE 4 MG/250ML-% IV SOLN
0.0000 ug/min | INTRAVENOUS | Status: DC
  Administered 2022-10-18: 40 ug/min via INTRAVENOUS
  Administered 2022-10-18: 12 ug/min via INTRAVENOUS
  Administered 2022-10-18: 40 ug/min via INTRAVENOUS
  Filled 2022-10-18 (×2): qty 250

## 2022-10-18 MED ORDER — MORPHINE 100MG IN NS 100ML (1MG/ML) PREMIX INFUSION: 0.0000 mg/h | INTRAVENOUS | Status: DC

## 2022-10-18 MED ORDER — GLYCOPYRROLATE 0.2 MG/ML IJ SOLN: 0.2000 mg | INTRAMUSCULAR | Status: DC | PRN

## 2022-10-18 MED ORDER — POLYVINYL ALCOHOL 1.4 % OP SOLN: 1.0000 [drp] | Freq: Four times a day (QID) | OPHTHALMIC | Status: DC | PRN

## 2022-10-18 MED ORDER — LACTATED RINGERS IV BOLUS (SEPSIS)
250.0000 mL | Freq: Once | INTRAVENOUS | Status: AC
  Administered 2022-10-18: 250 mL via INTRAVENOUS

## 2022-10-18 MED ORDER — GLYCOPYRROLATE 1 MG PO TABS: 1.0000 mg | ORAL_TABLET | ORAL | Status: DC | PRN

## 2022-10-18 MED ORDER — SODIUM CHLORIDE 0.9 % IV SOLN: 250.0000 mL | INTRAVENOUS | Status: DC

## 2022-10-18 MED ORDER — PIPERACILLIN-TAZOBACTAM 3.375 G IVPB: 3.3750 g | Freq: Three times a day (TID) | INTRAVENOUS | Status: DC

## 2022-10-18 MED ORDER — PANTOPRAZOLE INFUSION (NEW) - SIMPLE MED
8.0000 mg/h | INTRAVENOUS | Status: DC
  Administered 2022-10-18 (×2): 8 mg/h via INTRAVENOUS
  Filled 2022-10-18: qty 100

## 2022-10-18 MED ORDER — PANTOPRAZOLE 80MG IVPB - SIMPLE MED
80.0000 mg | Freq: Once | INTRAVENOUS | Status: AC
  Administered 2022-10-18: 80 mg via INTRAVENOUS
  Filled 2022-10-18: qty 100

## 2022-10-18 MED ORDER — PANTOPRAZOLE INFUSION (NEW) - SIMPLE MED
8.0000 mg/h | INTRAVENOUS | Status: DC
  Filled 2022-10-18: qty 100

## 2022-10-18 MED ORDER — SODIUM CHLORIDE 0.9 % IV SOLN: INTRAVENOUS | Status: DC

## 2022-10-18 MED ORDER — INSULIN ASPART 100 UNIT/ML IV SOLN
5.0000 [IU] | Freq: Once | INTRAVENOUS | Status: AC
  Administered 2022-10-18: 5 [IU] via INTRAVENOUS

## 2022-10-18 MED ORDER — EPINEPHRINE HCL 5 MG/250ML IV SOLN IN NS
0.5000 ug/min | INTRAVENOUS | Status: DC
  Administered 2022-10-18: 0.5 ug/min via INTRAVENOUS
  Filled 2022-10-18: qty 250

## 2022-11-15 NOTE — Sepsis Progress Note (Addendum)
Elink following code sepsis  1615 messaged bedside RN asking if any plans to get a 3rd, Bedside Rn updated me letting me know pt will be transitioning to hospice.

## 2022-11-15 NOTE — ED Triage Notes (Signed)
Pt BIB [facility] as Code Sepsis r/t possible infections of peg tube.  Symptoms began yesterday. Pt severely contracted and alert to verbal and touch.  Pt feels febrile.    60/30 on scene for EMS.  70/50 after 500 mL of fluid.   HR 130 on scene 117 after fluids.  CBG 109. 92% RA on scene 100% 4L.

## 2022-11-15 NOTE — Progress Notes (Signed)
ED Pharmacy Antibiotic Sign Off An antibiotic consult was received from an ED provider for vancomycin per pharmacy dosing for sepsis. A chart review was completed to assess appropriateness.  A single dose of vancomycin and zosyn was placed by the ED provider.   The following one time order(s) were placed per pharmacy consult: None  Further antibiotic and/or antibiotic pharmacy consults should be ordered by the admitting provider if indicated.   Thank you for allowing pharmacy to be a part of this patient's care.   Delmar Landau, PharmD, BCPS 11/05/2022 11:14 AM ED Clinical Pharmacist -  [phone]

## 2022-11-15 NOTE — Progress Notes (Signed)
[facility] Liaison Note  Patient is current home hospice patient with [employer].  Patient noted to be in active dying process per critical care MD. Liaison team to follow in case patient stablizes and discharge disposition needed.  Doreatha Martin, RN [facility] Liaison [phone]

## 2022-11-15 NOTE — ED Notes (Signed)
RT approached this writer to inform of PT's oxygen saturation dropping into the 50s. RT informed same that PT was placed on a NRB.   Corrie Dandy RN and [place], DO made aware of same.

## 2022-11-15 NOTE — ED Provider Notes (Signed)
[place] EMERGENCY DEPARTMENT AT [facility] Provider Note   CSN: [id number] Arrival date & time: 10/24/2022  1036     History {Add pertinent medical, surgical, social history, OB history to HPI:1} No chief complaint on file.   [name] is a 77 y.o. male with past medical history significant for dementia, non verbal, prostate cancer, chronic contractions, stroke, PEG tube presents to the ED from home via EMS due to patient vomiting or coughing up blood and having respiratory distress.  [name] also states there was blood in the PEG tube and she feels that home health has been "aggressively manipulating" his tube when doing feeds.  Patient was recently discharged from the hospital and was admitted to the ICU for malfunctioning G-tube, aspiration pneumonia and UTI.  Patient was sent home on antibiotics, but daughter states she feels these are not working because his PEG tube is not working.  Patient arrived hypotensive and tachycardic with EMS.  [name] states she is HPOA.         Home Medications Prior to Admission medications   Medication Sig Start Date End Date Taking? Authorizing Provider  acetaminophen (TYLENOL) 325 MG tablet Place 2 tablets (650 mg total) into feeding tube every 6 (six) hours as needed for mild pain (or Fever >/= 101). 10/13/22   Marguerita Merles Latif, DO  amoxicillin-clavulanate (AUGMENTIN) 600-42.9 MG/5ML suspension Place 7.3 mLs (875 mg total) into feeding tube 2 (two) times daily. 10/13/22   Sheikh, Omair Latif, DO  GERI-TUSSIN 100 MG/5ML liquid Place 5 mLs into feeding tube every 4 (four) hours as needed for cough or to loosen phlegm. 04/21/22   [provider]  Nutritional Supplements (FEEDING SUPPLEMENT, JEVITY 1.2 CAL,) LIQD Place 1,000 mLs into feeding tube continuous. 1,000 mL, Per Tube, at 55 mL/hr, Continuous. 04/27/22   Hongalgi, Maximino Greenland, MD  ondansetron (ZOFRAN) 4 MG tablet Place 1 tablet (4 mg total) into feeding tube  every 6 (six) hours as needed for nausea. 10/13/22   Marguerita Merles Latif, DO  Water For Irrigation, Sterile (FREE WATER) SOLN Place 120 mLs into feeding tube every 6 (six) hours. 04/27/22   Hongalgi, Maximino Greenland, MD      Allergies    Patient has no known allergies.    Review of Systems   Review of Systems  Unable to perform ROS: Dementia    Physical Exam Updated Vital Signs BP (!) 62/42   Pulse (!) 120   Resp (!) 22   SpO2 97%  Physical Exam Vitals and nursing note reviewed.  Constitutional:      General: He is not in acute distress.    Appearance: He is ill-appearing and toxic-appearing. He is not diaphoretic.  HENT:     Mouth/Throat:     Mouth: Mucous membranes are moist.     Pharynx: Oropharynx is clear.  Cardiovascular:     Rate and Rhythm: Regular rhythm. Tachycardia present.     Pulses: Normal pulses.     Heart sounds: Normal heart sounds.  Pulmonary:     Effort: Pulmonary effort is normal. No respiratory distress.     Breath sounds: Normal air entry. Examination of the right-lower field reveals rales. Examination of the left-lower field reveals rales. Rales present.  Abdominal:     General: Abdomen is flat. There is no distension.     Palpations: Abdomen is soft.  Musculoskeletal:     Comments: Severe contractions of upper and lower extremities.  Skin:    General: Skin  is warm and dry.     Capillary Refill: Capillary refill takes less than 2 seconds.     Coloration: Skin is pale.  Neurological:     Mental Status: He is alert. Mental status is at baseline.     GCS: GCS eye subscore is 4. GCS verbal subscore is 2. GCS motor subscore is 1.     Comments: Patient is mentating at his baseline per family.  He is awake, looking around, and moans.  He is non-verbal.    Psychiatric:        Mood and Affect: Mood normal.        Behavior: Behavior normal.     ED Results / Procedures / Treatments   Labs (all labs ordered are listed, but only abnormal results are  displayed) Labs Reviewed  RESP PANEL BY RT-PCR (RSV, FLU A&B, COVID)  RVPGX2 - Abnormal; Notable for the following components:      Result Value   SARS Coronavirus 2 by RT PCR POSITIVE (*)    All other components within normal limits  COMPREHENSIVE METABOLIC PANEL - Abnormal; Notable for the following components:   Sodium 125 (*)    Potassium 6.8 (*)    Chloride 92 (*)    CO2 16 (*)    BUN 55 (*)    Creatinine, Ser 1.42 (*)    Calcium 8.1 (*)    Total Protein 5.2 (*)    Albumin 2.4 (*)    GFR, Estimated 52 (*)    Anion gap 17 (*)    All other components within normal limits  CBC WITH DIFFERENTIAL/PLATELET - Abnormal; Notable for the following components:   RBC 1.56 (*)    Hemoglobin 5.3 (*)    HCT 15.8 (*)    MCV 101.3 (*)    Lymphs Abs 0.4 (*)    Abs Immature Granulocytes 0.08 (*)    All other components within normal limits  I-STAT CG4 LACTIC ACID, ED - Abnormal; Notable for the following components:   Lactic Acid, Venous 4.5 (*)    All other components within normal limits  I-STAT CG4 LACTIC ACID, ED - Abnormal; Notable for the following components:   Lactic Acid, Venous 7.0 (*)    All other components within normal limits  CBG MONITORING, ED - Abnormal; Notable for the following components:   Glucose-Capillary 149 (*)    All other components within normal limits  CULTURE, BLOOD (ROUTINE X 2)  CULTURE, BLOOD (ROUTINE X 2)  CBC WITH DIFFERENTIAL/PLATELET  URINALYSIS, W/ REFLEX TO CULTURE (INFECTION SUSPECTED)  PROTIME-INR  APTT  I-STAT ARTERIAL BLOOD GAS, ED  PREPARE RBC (CROSSMATCH)  TYPE AND SCREEN    EKG EKG Interpretation Date/Time:  Sunday October 18 2022 10:45:35 EDT Ventricular Rate:  121 PR Interval:    QRS Duration:  82 QT Interval:  322 QTC Calculation: 457 R Axis:   -79  Text Interpretation: Sinus tachycardia Inferior infarct, old Artifact in lead(s) I V1 V2 V3 V6 No significant change since last tracing Confirmed by Elayne Snare ([id number]) on  11/04/2022 11:58:18 AM  Radiology DG Chest Portable 1 View  Result Date: 10/17/2022 CLINICAL DATA:  confirmation central line EXAM: PORTABLE CHEST 1 VIEW COMPARISON:  CXR 10/23/2022 FINDINGS: Interval placement of a right-sided central venous catheter with the tip of the cavoatrial junction. No evidence of a pneumothorax. No focal airspace opacity. Unchanged cardiac and mediastinal contours. No radiographically apparent displaced rib fractures. Visualized upper abdomen is unremarkable. IMPRESSION: Interval placement of a right-sided central  venous catheter with the tip at the cavoatrial junction. No evidence of a pneumothorax. Electronically Signed   By: Lorenza Cambridge M.D.   On: 10/26/2022 12:55   DG Chest Port 1 View  Result Date: 11/11/2022 CLINICAL DATA:  Questionable sepsis.  Evaluate for abnormality. EXAM: PORTABLE CHEST 1 VIEW COMPARISON:  10/12/2022 unchanged FINDINGS: The heart size and mediastinal contours are within normal limits. Asymmetric elevation of right hemidiaphragm. Both lungs are clear. The visualized skeletal structures are unremarkable. IMPRESSION: No active disease. Electronically Signed   By: Signa Kell M.D.   On: 10/31/2022 11:46    Procedures Procedures  {Document cardiac monitor, telemetry assessment procedure when appropriate:1}  Medications Ordered in ED Medications  lactated ringers infusion ( Intravenous New Bag/Given 10/17/2022 1125)  vancomycin (VANCOCIN) IVPB 1000 mg/200 mL premix (1,000 mg Intravenous New Bag/Given 11/13/2022 1231)  0.9 %  sodium chloride infusion (0 mLs Intravenous Hold 10/19/2022 1121)  pantoprozole (PROTONIX) 80 mg /NS 100 mL infusion (8 mg/hr Intravenous New Bag/Given 11/01/2022 1255)  pantoprazole (PROTONIX) injection 40 mg (has no administration in time range)  0.9 %  sodium chloride infusion (Manually program via Guardrails IV Fluids) (has no administration in time range)  norepinephrine (LEVOPHED) 4mg  in (0.016 mg/mL) premix infusion (has no  administration in time range)  lactated ringers bolus 1,000 mL (0 mLs Intravenous Stopped 11/01/2022 1203)    And  lactated ringers bolus 250 mL (0 mLs Intravenous Stopped 11/14/2022 1125)  piperacillin-tazobactam (ZOSYN) IVPB 3.375 g (0 g Intravenous Stopped 10/17/2022 1203)  pantoprazole (PROTONIX) 80 mg /NS 100 mL IVPB (0 mg Intravenous Stopped 10/25/2022 1319)  calcium gluconate 1 g/ 50 mL sodium chloride IVPB (0 mg Intravenous Stopped 10/23/2022 1319)  insulin aspart (novoLOG) injection 5 Units (5 Units Intravenous Given 11/04/2022 1307)    And  dextrose 50 % solution 50 mL (50 mLs Intravenous Given 11/05/2022 1302)    ED Course/ Medical Decision Making/ A&P Clinical Course as of 10/24/2022 1328  Sun Oct 18, 2022  1112 ECG Heart Rate(!): 125 [VK]  1314 Lactic Acid, Venous(!!): 7.0 [MC]  1314 Hemoglobin(!!): 5.3 [MC]  1314 Potassium(!!): 6.8 [MC]  1314 SARS Coronavirus 2 by RT PCR(!): POSITIVE [MC]    Clinical Course User Index [MC] Lenard Simmer, PA-C [VK] Rexford Maus, DO   {   Click here for ABCD2, HEART and other calculatorsREFRESH Note before signing :1}                              Medical Decision Making Amount and/or Complexity of Data Reviewed Labs: ordered. Decision-making details documented in ED Course. Radiology: ordered. ECG/medicine tests: ordered.  Risk OTC drugs. Prescription drug management. Decision regarding hospitalization.   This patient presents to the ED with chief complaint(s) of *** with pertinent past medical history of ***.  The complaint involves an extensive differential diagnosis and also carries with it a high risk of complications and morbidity.    The differential diagnosis includes ***   The initial plan is to obtain sepsis workup  Additional history obtained: Additional history obtained from family - patient's daughter provides HPI, patient has advanced dementia and is non-verbal.  She is healthcare POA.  EMS also reports patient is hypotensive,  tachycardic, and tachypneic.  They placed him on oxygen via nasal cannula and gave him IVF en route.   Records reviewed previous admission documents  Initial Assessment:   Exam significant for chronically ill-appearing  patient who is tachypneic and tachycaridc.  Skin is warm, dry, and pale.  He has severe contractures of the upper and lower extremities.  PERRL.  Patient is alert, looking around, and moaning.  He is non-verbal at baseline.    Independent ECG/labs interpretation:  The following labs were independently interpreted:  ***  Independent visualization and interpretation of imaging: I independently visualized the following imaging with scope of interpretation limited to determining acute life threatening conditions related to emergency care: chest x-ray, which revealed no evidence of active cardiopulmonary disease.    Treatment and Reassessment: ***  Consultations obtained:   I spoke with Dr. Cheri Fowler with critical care about patient case.  Critical care will come evaluate patient.  I also spoke with Doug Sou, PA-C with [facility].  GI will come see patient.   Critical care provider, Dr. Durel Salts, evaluated patient and discussed with family that patient is actively dying and he should not receive advanced life support.  See provider note for more detail.  Patient will be transitioned to hospice with descalation of care.  Dr. Celine Mans requested we consult with hospitalist for admission.   I spoke with Dr. Illene Regulus, on call hospitalist, who agreed with admission to hospice unit.    Disposition:   Patient to be admitted to hospital under hospice care.    Social Determinants of Health:   Patient's {[id number]}  increases the complexity of managing their presentation   {Document critical care time when appropriate:1} {Document review of labs and clinical decision tools ie heart score, Chads2Vasc2 etc:1}  {Document your independent review of radiology images, and any  outside records:1} {Document your discussion with family members, caretakers, and with consultants:1} {Document social determinants of health affecting pt's care:1} {Document your decision making why or why not admission, treatments were needed:1} Final Clinical Impression(s) / ED Diagnoses Final diagnoses:  Hyperkalemia  Upper GI bleed  Anemia, unspecified type  Septic shock (HCC)  COVID-19    Rx / DC Orders ED Discharge Orders     None

## 2022-11-15 NOTE — Progress Notes (Signed)
ED staff currently at bedside placing central vascular access.

## 2022-11-15 NOTE — Death Summary Note (Signed)
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 year old male with advanced Alzheimer's dementia who is bedbound, nonverbal, unable to eat, with PEG tube, who presents from home with respiratory distress.  He had a recent ICU admission for malfunctioning G-tube.  He also had aspiration pneumonia which they found was due to son feeding the patient despite obvious aspiration.  He also has recurrent UTIs with ESBL organisms.  The patient's daughter is at the bedside and says that the patient was on hospice because they were unable to take care of him.  Patient's family said that there was blood coming out of the G-tube as well as his mouth which prompted the phone call to EMS.  He has also been febrile and hypotensive since he arrived here.  found to be COVID-positive.  He was hypotensive despite fluid resuscitation and started on vasopressors.  PCCM was called for further care and admission. Upon examination of the patient and discussion with family it was clear that there was no meaningful interventions the ICU could provide which would prolong his life or quality of life. Discussed with them the inevitable progression of end stage alzheimer's disease and the patient's family transitioned to comfort measures. He passed away  with children at bedside.     Pertinent Labs and Studies  Significant Diagnostic Studies DG Chest Portable 1 View  Result Date: 11/16/2022 CLINICAL DATA:  confirmation central line EXAM: PORTABLE CHEST 1 VIEW COMPARISON:  CXR 11/16/2022 FINDINGS: Interval placement of a right-sided central venous catheter with the tip of the cavoatrial junction. No evidence of a pneumothorax. No focal airspace opacity. Unchanged cardiac and mediastinal contours. No radiographically apparent displaced rib fractures. Visualized upper abdomen is unremarkable. IMPRESSION: Interval placement of a right-sided central venous catheter with the tip at the cavoatrial junction. No evidence of a pneumothorax. Electronically Signed   By: Lorenza Cambridge M.D.   On: 2022/11/16 12:55   DG Chest Port 1 View  Result Date: 2022/11/16 CLINICAL DATA:  Questionable sepsis.  Evaluate for abnormality. EXAM: PORTABLE CHEST 1 VIEW COMPARISON:  10/12/2022 unchanged FINDINGS: The heart size and mediastinal contours are within normal limits. Asymmetric elevation of right hemidiaphragm. Both lungs are clear. The visualized skeletal structures are unremarkable. IMPRESSION: No active disease. Electronically Signed   By: Signa Kell M.D.   On: November 16, 2022 11:46   DG CHEST PORT 1 VIEW  Result Date: 10/12/2022 CLINICAL DATA:  Shortness of breath. EXAM: PORTABLE CHEST 1 VIEW COMPARISON:  10/11/2022 FINDINGS: Stable heart size. Stable tortuosity of the thoracic aorta. Mild elevation of the right hemidiaphragm. There is no evidence of pulmonary edema, consolidation, pneumothorax or pleural fluid. IMPRESSION: No acute findings. Mild elevation of the right hemidiaphragm. Electronically Signed   By: Irish Lack M.D.   On: 10/12/2022 10:08   DG CHEST PORT 1 VIEW  Result Date: 10/11/2022 CLINICAL DATA:  Shortness of breath. EXAM: PORTABLE CHEST 1 VIEW COMPARISON:  10/28/2021 FINDINGS: Heart size is normal. Stable tortuosity of thoracic aorta. Both lungs are  clear. IMPRESSION: No active disease. Electronically Signed   By: Danae Orleans M.D.   On: 10/11/2022 10:53   CT ABDOMEN PELVIS W CONTRAST  Result Date: 10/09/2022 CLINICAL DATA:  Leaking feeding tube abdomen pain EXAM: CT ABDOMEN AND PELVIS WITH CONTRAST TECHNIQUE: Multidetector CT imaging of the abdomen and pelvis was performed using the standard protocol following bolus administration of intravenous contrast. RADIATION DOSE REDUCTION: This exam was performed according to the departmental dose-optimization program which includes automated exposure control, adjustment of the mA and/or kV according to patient size and/or use of iterative reconstruction technique. CONTRAST:  60mL OMNIPAQUE IOHEXOL 350 MG/ML SOLN COMPARISON:  CT 07/04/2021 FINDINGS: Lower chest: Lung bases demonstrate centrilobular density in the right middle and lower lobes. There is mucous plugging or debris within right lower lobe bronchi. No consolidation or effusion. Small hiatal hernia Hepatobiliary: Contracted gallbladder. No calcified stone or biliary dilatation. Subcentimeter hypodensity within the central liver too small to further characterize. Pancreas: No inflammation. Mild diffuse prominence of pancreatic duct measuring up to 4 mm. Spleen: Normal in size without focal abnormality. Adrenals/Urinary Tract: Adrenal glands are within normal limits. Bilateral renal cysts for which no imaging follow-up is recommended. There is no hydronephrosis. Catheter within the bladder. Diffuse bladder wall thickening. Bladder is decompressed Stomach/Bowel: Stomach nondistended. Gastrostomy tube balloon within the body of the stomach. Some skin thickening and loss of subcutaneous soft tissues at the stoma. No rim enhancing fluid collections to suggest an abscess. No obstruction. Decompressed small bowel. No acute bowel wall thickening. Vascular/Lymphatic: Nonaneurysmal aorta. Mild atherosclerosis. No aneurysm. Reproductive: Negative for mass. Metallic  clips in the region of the prostate. Other: Negative for pelvic effusion or free air. Musculoskeletal: Scoliosis and degenerative changes of the spine. No acute osseous abnormality. IMPRESSION: 1. Gastrostomy tube balloon within the body of the stomach. Some skin thickening and loss of subcutaneous soft tissues at the stoma, correlate with direct inspection. No rim enhancing fluid collections to suggest an abscess. 2. Findings suggesting infectious or inflammatory bronchiolitis versus aspiration at the right lower lobe and right middle lobe. 3. Diffuse bladder wall thickening, question cystitis. 4. Mild diffuse prominence of pancreatic duct measuring up to 4 mm. No obvious mass. No inflammation 5. Small hiatal hernia. Electronically Signed   By: Jasmine Pang M.D.   On: 10/09/2022 20:14    Microbiology Recent Results (from the past 240 hour(s))  Urine Culture (for pregnant, neutropenic or urologic patients or patients with an indwelling urinary catheter)     Status: Abnormal   Collection Time: 10/09/22  2:22 PM   Specimen: Urine, Clean Catch  Result Value Ref Range Status   Specimen Description URINE, CLEAN CATCH  Final   Special Requests   Final    NONE Performed at [facility], [address], [place], [place] [address]    Culture MULTIPLE SPECIES PRESENT, SUGGEST RECOLLECTION (A)  Final   Report Status 10/10/2022 FINAL  Final  Resp panel by RT-PCR (RSV, Flu A&B, Covid) Anterior Nasal Swab     Status: Abnormal   Collection Time: 11/04/2022 10:42 AM   Specimen: Anterior Nasal Swab  Result Value Ref Range Status   SARS Coronavirus 2 by RT PCR POSITIVE (A) NEGATIVE Final   Influenza A by PCR NEGATIVE NEGATIVE Final   Influenza B by PCR NEGATIVE  NEGATIVE Final    Comment: (NOTE) The Xpert Xpress SARS-CoV-2/FLU/RSV plus assay is intended as an aid in the diagnosis of influenza from Nasopharyngeal swab specimens and should not be used as a sole basis for treatment. Nasal washings  and aspirates are unacceptable for Xpert Xpress SARS-CoV-2/FLU/RSV testing.  Fact Sheet for Patients: BloggerCourse.com  Fact Sheet for Healthcare Providers: SeriousBroker.it  This test is not yet approved or cleared by the [place] FDA and has been authorized for detection and/or diagnosis of SARS-CoV-2 by FDA under an Emergency Use Authorization (EUA). This EUA will remain in effect (meaning this test can be used) for the duration of the COVID-19 declaration under Section 564(b)(1) of the Act, 21 U.S.C. section 360bbb-3(b)(1), unless the authorization is terminated or revoked.     Resp Syncytial Virus by PCR NEGATIVE NEGATIVE Final    Comment: (NOTE) Fact Sheet for Patients: BloggerCourse.com  Fact Sheet for Healthcare Providers: SeriousBroker.it  This test is not yet approved or cleared by the [place] FDA and has been authorized for detection and/or diagnosis of SARS-CoV-2 by FDA under an Emergency Use Authorization (EUA). This EUA will remain in effect (meaning this test can be used) for the duration of the COVID-19 declaration under Section 564(b)(1) of the Act, 21 U.S.C. section 360bbb-3(b)(1), unless the authorization is terminated or revoked.  Performed at [facility], [address], [place], [place] [address]     Lab Basic Metabolic Panel: Recent Labs  Lab 10/12/22 0241 10/13/22 0227 11/10/2022 1050  NA 131* 131* 125*  K 4.3 4.6 6.8*  CL 104 101 92*  CO2 20* 20* 16*  GLUCOSE 128* 91 81  BUN 12 13 55*  CREATININE 0.70 0.68 1.42*  CALCIUM 8.2* 8.3* 8.1*  MG 1.8 1.9  --   PHOS 2.2* 4.1  --    Liver Function Tests: Recent Labs  Lab 10/12/22 0241 10/13/22 0227 11/05/2022 1050  AST 25 27 38  ALT 25 23 29   ALKPHOS 60 55 43  BILITOT 0.5 <0.1* 0.4  PROT 6.0* 5.8* 5.2*  ALBUMIN 2.5* 2.6* 2.4*   No results for input(s): "LIPASE",  "AMYLASE" in the last 168 hours. No results for input(s): "AMMONIA" in the last 168 hours. CBC: Recent Labs  Lab 10/12/22 0241 10/13/22 0227 11/07/2022 1200  WBC 4.5 4.5 7.0  NEUTROABS 2.9 2.4 6.2  HGB 9.4* 9.6* 5.3*  HCT 27.1* 28.1* 15.8*  MCV 96.1 96.2 101.3*  PLT 292 265 160   Cardiac Enzymes: No results for input(s): "CKTOTAL", "CKMB", "CKMBINDEX", "TROPONINI" in the last 168 hours. Sepsis Labs: Recent Labs  Lab 10/12/22 0241 10/13/22 0227 11/08/2022 1113 10/23/2022 1200 10/15/2022 1305  WBC 4.5 4.5  --  7.0  --   LATICACIDVEN  --   --  4.5*  --  7.0*

## 2022-11-15 NOTE — ED Provider Notes (Signed)
Physical Exam  BP (!) 76/53   Pulse (!) 121   Temp 98 F (36.7 C) (Temporal)   Resp (!) 32   SpO2 (!) 86%   Physical Exam  Procedures  .Central Line  Date/Time: 10/15/2022 12:33 PM  Performed by: Rexford Maus, DO Authorized by: Rexford Maus, DO   Consent:    Consent obtained:  Verbal   Consent given by:  Healthcare agent   Risks, benefits, and alternatives were discussed: yes     Risks discussed:  Arterial puncture, bleeding, incorrect placement, infection, nerve damage and pneumothorax   Alternatives discussed:  No treatment and delayed treatment Universal protocol:    Procedure explained and questions answered to patient or proxy's satisfaction: yes     Patient identity confirmed:  Arm band Pre-procedure details:    Indication(s): central venous access and insufficient peripheral access     Hand hygiene: Hand hygiene performed prior to insertion     Sterile barrier technique: All elements of maximal sterile technique followed     Skin preparation:  Chlorhexidine   Skin preparation agent: Skin preparation agent completely dried prior to procedure   Sedation:    Sedation type:  None Anesthesia:    Anesthesia method:  Local infiltration   Local anesthetic:  Lidocaine 1% w/o epi Procedure details:    Location:  R internal jugular   Site selection rationale:  Contracted, unable to access femoral or subclavian   Patient position:  Trendelenburg   Procedural supplies:  Triple lumen   Catheter size:  7 Fr   Landmarks identified: yes     Ultrasound guidance: yes     Ultrasound guidance timing: prior to insertion and real time     Sterile ultrasound techniques: Sterile gel and sterile probe covers were used     Number of attempts:  1   Successful placement: yes   Post-procedure details:    Post-procedure:  Dressing applied and line sutured   Assessment:  Blood return through all ports, free fluid flow, placement verified by x-ray and no pneumothorax on  x-ray   Procedure completion:  Tolerated well, no immediate complications .Critical Care  Performed by: Rexford Maus, DO Authorized by: Rexford Maus, DO   Critical care provider statement:    Critical care time (minutes):  70   Critical care time was exclusive of:  Separately billable procedures and treating other patients   Critical care was necessary to treat or prevent imminent or life-threatening deterioration of the following conditions:  Circulatory failure, sepsis and shock   Critical care was time spent personally by me on the following activities:  Development of treatment plan with patient or surrogate, discussions with consultants, evaluation of patient's response to treatment, examination of patient, obtaining history from patient or surrogate, ordering and review of laboratory studies, ordering and performing treatments and interventions, ordering and review of radiographic studies, pulse oximetry, re-evaluation of patient's condition and review of old charts   ED Course / MDM   Clinical Course as of 11/08/2022 1519  Sun Oct 18, 2022  1112 ECG Heart Rate(!): 125 [VK]  1314 Lactic Acid, Venous(!!): 7.0 [MC]  1314 Hemoglobin(!!): 5.3 [MC]  1314 Potassium(!!): 6.8 [MC]  1314 SARS Coronavirus 2 by RT PCR(!): POSITIVE [MC]    Clinical Course User Index [MC] Lenard Simmer, PA-C [VK] Rexford Maus, DO   Medical Decision Making Amount and/or Complexity of Data Reviewed Labs: ordered. Decision-making details documented in ED Course. Radiology: ordered. ECG/medicine tests:  ordered.  Risk OTC drugs. Prescription drug management. Decision regarding hospitalization.         Elayne Snare K, DO 10/19/2022 [phone]

## 2022-11-15 NOTE — Consult Note (Addendum)
Referring Provider: EDP Primary Care Physician:  Pcp, No Primary Gastroenterologist:  Dr. Marina Goodell  Reason for Consultation:  Hematemesis, anemia  HPI: [name] is a 77 y.o. male with severe Alzheimer's dementia who has a past medical history otherwise listed below.  Was recently admitted for PEG tube dysfunction and just discharged on 7/30.  He has home health and apparently the family notes that they feel that they have been rough with his PEG tube.  He has been having hematemesis for the past couple of days and dark material noted in his PEG tube.  Hgb 5.3 grams as compared to 9.6 grams just 5 days ago.   Patient is positive for Covid on a NRB.  Lactic acid is 7, potassium 6.8, sodium 125, creatinine about 1.42.  Colonoscopy November 2021 with a 3 mm polyp removed that was a tubular adenoma on pathology, diverticulosis in the sigmoid colon and the right colon, and multiple bleeding colonic angioectasias that were treated with APC.  EGD November 2021 showed a hiatal hernia and may be some GERD, but otherwise normal.  Patient is nonresponsive and nonverbal.  Daughter is at bedside and speaks [place].  Past Medical History:  Diagnosis Date   Adenomatous polyp of colon 06/2010   4 polyps removed at colonoscopy by Dr Marina Goodell   Anxiety    Chronic headaches    Depression    Diverticulosis of colon 08/2010   Internal hemorrhoids 08/2010   Prostate cancer [facility])    receiving radiation treatment   Subdural hematoma, chronic (HCC)    Tuberculosis     Past Surgical History:  Procedure Laterality Date   BIOPSY  01/29/2020   Procedure: BIOPSY;  Surgeon: Hilarie Fredrickson, MD;  Location: [facility] ENDOSCOPY;  Service: Endoscopy;;   COLONOSCOPY WITH PROPOFOL N/A 01/29/2020   Procedure: COLONOSCOPY WITH PROPOFOL;  Surgeon: Hilarie Fredrickson, MD;  Location: [facility] ENDOSCOPY;  Service: Endoscopy;  Laterality: N/A;   ESOPHAGOGASTRODUODENOSCOPY (EGD) WITH PROPOFOL N/A 01/29/2020   Procedure: ESOPHAGOGASTRODUODENOSCOPY  (EGD) WITH PROPOFOL;  Surgeon: Hilarie Fredrickson, MD;  Location: [facility] ENDOSCOPY;  Service: Endoscopy;  Laterality: N/A;   FLEXIBLE SIGMOIDOSCOPY  03/29/2012   Procedure: FLEXIBLE SIGMOIDOSCOPY;  Surgeon: Louis Meckel, MD;  Location: [facility] ENDOSCOPY;  Service: Endoscopy;  Laterality: N/A;  possibly may do colonoscopy but prep is only for a flex   FLEXIBLE SIGMOIDOSCOPY  03/31/2012   Procedure: FLEXIBLE SIGMOIDOSCOPY;  Surgeon: Louis Meckel, MD;  Location: [facility] ENDOSCOPY;  Service: Endoscopy;  Laterality: N/A;   HEMORRHOID BANDING  03/31/2012   Procedure: HEMORRHOID BANDING;  Surgeon: Louis Meckel, MD;  Location: [facility] ENDOSCOPY;  Service: Endoscopy;  Laterality: N/A;   HOT HEMOSTASIS N/A 01/29/2020   Procedure: HOT HEMOSTASIS (ARGON PLASMA COAGULATION/BICAP);  Surgeon: Hilarie Fredrickson, MD;  Location: [facility] ENDOSCOPY;  Service: Endoscopy;  Laterality: N/A;   MULTIPLE EXTRACTIONS WITH ALVEOLOPLASTY N/A 08/08/2013   Procedure: MULTIPLE EXTRACTION OF TEETH #1, 2, 3, 6, 7, 9, 11, 15, 16, 17, 20, 21, 22, 23, 24, 26, 29, 30, 32 WITH ALVEOLOPLASTY AND REMOVAL BUCCAL EXOSTOSIS LEFT MAXILLA;  Surgeon: Georgia Lopes, DDS;  Location: [facility] OR;  Service: Oral Surgery;  Laterality: N/A;   shrapnel removal     skull during [name] War    Prior to Admission medications   Medication Sig Start Date End Date Taking? Authorizing Provider  acetaminophen (TYLENOL) 325 MG tablet Place 2 tablets (650 mg total) into feeding tube every 6 (six) hours as needed for mild pain (or  Fever >/= 101). 10/13/22   Marguerita Merles Latif, DO  amoxicillin-clavulanate (AUGMENTIN) 600-42.9 MG/5ML suspension Place 7.3 mLs (875 mg total) into feeding tube 2 (two) times daily. 10/13/22   Sheikh, Omair Latif, DO  GERI-TUSSIN 100 MG/5ML liquid Place 5 mLs into feeding tube every 4 (four) hours as needed for cough or to loosen phlegm. 04/21/22   [provider]  Nutritional Supplements (FEEDING SUPPLEMENT, JEVITY 1.2 CAL,) LIQD Place 1,000 mLs into feeding  tube continuous. 1,000 mL, Per Tube, at 55 mL/hr, Continuous. 04/27/22   Hongalgi, Maximino Greenland, MD  ondansetron (ZOFRAN) 4 MG tablet Place 1 tablet (4 mg total) into feeding tube every 6 (six) hours as needed for nausea. 10/13/22   Marguerita Merles Latif, DO  Water For Irrigation, Sterile (FREE WATER) SOLN Place 120 mLs into feeding tube every 6 (six) hours. 04/27/22   Elease Etienne, MD    Current Facility-Administered Medications  Medication Dose Route Frequency Provider Last Rate Last Admin   0.9 %  sodium chloride infusion (Manually program via Guardrails IV Fluids)   Intravenous Once Lenard Simmer, PA-C   Held at 11/05/2022 1335   0.9 %  sodium chloride infusion  250 mL Intravenous Continuous Melton Alar R, PA-C   Held at 11/08/2022 1121   lactated ringers infusion   Intravenous Continuous Clark, Meghan R, PA-C 150 mL/hr at 10/17/2022 1125 New Bag at 11/06/2022 1125   norepinephrine (LEVOPHED) 4mg  in (0.016 mg/mL) premix infusion  0-40 mcg/min Intravenous Continuous Elayne Snare K, DO 75 mL/hr at 10/24/2022 1334 20 mcg/min at 10/23/2022 1334   [START ON 10/22/2022] pantoprazole (PROTONIX) injection 40 mg  40 mg Intravenous Q12H Kingsley, Victoria K, DO       pantoprozole (PROTONIX) 80 mg /NS 100 mL infusion  8 mg/hr Intravenous Continuous Kingsley, Victoria K, DO 10 mL/hr at 10/17/2022 1255 8 mg/hr at 10/17/2022 1255   Current Outpatient Medications  Medication Sig Dispense Refill   acetaminophen (TYLENOL) 325 MG tablet Place 2 tablets (650 mg total) into feeding tube every 6 (six) hours as needed for mild pain (or Fever >/= 101). 20 tablet 0   amoxicillin-clavulanate (AUGMENTIN) 600-42.9 MG/5ML suspension Place 7.3 mLs (875 mg total) into feeding tube 2 (two) times daily. 200 mL 0   GERI-TUSSIN 100 MG/5ML liquid Place 5 mLs into feeding tube every 4 (four) hours as needed for cough or to loosen phlegm.     Nutritional Supplements (FEEDING SUPPLEMENT, JEVITY 1.2 CAL,) LIQD Place 1,000 mLs into feeding  tube continuous. 1,000 mL, Per Tube, at 55 mL/hr, Continuous.     ondansetron (ZOFRAN) 4 MG tablet Place 1 tablet (4 mg total) into feeding tube every 6 (six) hours as needed for nausea. 20 tablet 0   Water For Irrigation, Sterile (FREE WATER) SOLN Place 120 mLs into feeding tube every 6 (six) hours.      Allergies as of 11/12/2022   (No Known Allergies)    No family history on file.  Social History   Socioeconomic History   Marital status: Divorced    Spouse name: Not on file   Number of children: 2   Years of education: Not on file   Highest education level: Not on file  Occupational History   Occupation: Disabled  Tobacco Use   Smoking status: Former    Current packs/day: 0.00    Average packs/day: 0.3 packs/day for 5.0 years (1.3 ttl pk-yrs)    Types: Cigarettes    Start date: 03/17/1987  Quit date: 03/16/1992    Years since quitting: 30.6   Smokeless tobacco: Never  Vaping Use   Vaping status: Not on file  Substance and Sexual Activity   Alcohol use: No    Alcohol/week: 0.0 standard drinks of alcohol   Drug use: No   Sexual activity: Not Currently  Other Topics Concern   Not on file  Social History Narrative   ** Merged History Encounter **       Social Determinants of Health   Financial Resource Strain: Not on file  Food Insecurity: Not on file  Transportation Needs: Not on file  Physical Activity: Not on file  Stress: Not on file  Social Connections: Not on file  Intimate Partner Violence: Not on file    Review of Systems: Non-verbal and non-responsive so not able to be obtained.  Physical Exam: Vital signs in last 24 hours: Pulse Rate:  [60-120] 120 (08/04 1208) Resp:  [13-23] 22 (08/04 1208) BP: (47-78)/(35-54) 62/42 (08/04 1218) SpO2:  [95 %-100 %] 97 % (08/04 1250)   General:  Chronically and acutely ill-appearing in respiratory distress.  Non-verbal and non-responsive. Head:  Normocephalic and atraumatic. Eyes:  Sclera clear, no icterus.   Conjunctiva pale. Ears:  Normal auditory acuity. Mouth:  No deformity or lesions.   Lungs:  Gurgling noted.  On NRB. Heart:  Tachycardic with regular rhythm. Abdomen:  Soft, PEG in place with some dark material in tubing.   Msk:  Symmetrical without gross deformities. Pulses:  Normal pulses noted. Extremities:  Severely contracted. Neurologic:  Non-verbal and non-responsive. Skin:  Intact without significant lesions or rashes.  Lab Results: Recent Labs    10/23/2022 1200  WBC 7.0  HGB 5.3*  HCT 15.8*  PLT 160   BMET Recent Labs    11/07/2022 1050  NA 125*  K 6.8*  CL 92*  CO2 16*  GLUCOSE 81  BUN 55*  CREATININE 1.42*  CALCIUM 8.1*   LFT Recent Labs    11/07/2022 1050  PROT 5.2*  ALBUMIN 2.4*  AST 38  ALT 29  ALKPHOS 43  BILITOT 0.4   Studies/Results: DG Chest Portable 1 View  Result Date: 10/15/2022 CLINICAL DATA:  confirmation central line EXAM: PORTABLE CHEST 1 VIEW COMPARISON:  CXR 11/14/2022 FINDINGS: Interval placement of a right-sided central venous catheter with the tip of the cavoatrial junction. No evidence of a pneumothorax. No focal airspace opacity. Unchanged cardiac and mediastinal contours. No radiographically apparent displaced rib fractures. Visualized upper abdomen is unremarkable. IMPRESSION: Interval placement of a right-sided central venous catheter with the tip at the cavoatrial junction. No evidence of a pneumothorax. Electronically Signed   By: Lorenza Cambridge M.D.   On: 10/25/2022 12:55   DG Chest Port 1 View  Result Date: 10/26/2022 CLINICAL DATA:  Questionable sepsis.  Evaluate for abnormality. EXAM: PORTABLE CHEST 1 VIEW COMPARISON:  10/12/2022 unchanged FINDINGS: The heart size and mediastinal contours are within normal limits. Asymmetric elevation of right hemidiaphragm. Both lungs are clear. The visualized skeletal structures are unremarkable. IMPRESSION: No active disease. Electronically Signed   By: Signa Kell M.D.   On: 10/19/2022 11:46     IMPRESSION:  *77 year old male with severe Alzheimer's dementia who has a past.  Was recently admitted for PEG tube dysfunction and just discharged on 7/30.  He has home health and apparently the family notes that they feel that they have been rough with his PEG tube.  He has been having hematemesis for the past couple of  days and dark material noted in his PEG tube.  Hgb 5.3 grams as compared to 9.6 grams just 5 days ago.  This could be due to ulceration in the PEG bumper versus other sources. *Hyperkalemia with potassium of 6.8 *Hyponatremia with sodium of 125 *Lactic acidosis with lactate level of 7 *COVID-19  PLAN: -PPI gtt. -Transfuse and monitor Hgb. -Correct other electrolyte abnormalities, etc.  **PCCM has just seen the patient.  Their recommendation is comfort care.   Princella Pellegrini. Zehr  11/11/2022, 1:35 PM  GI ATTENDING  History, laboratories, x-rays reviewed.  Agree with comprehensive consultation note as outlined above.  Unfortunate gentleman who presents with shock, GI bleeding, and multiple poor prognosticators.  We support recommendation for comfort care.  Wilhemina Bonito. Eda Keys., M.D. [facility] Division of Gastroenterology

## 2022-11-15 NOTE — Consult Note (Addendum)
NAMEKevontay Young, MRN:  [id number], DOB:  Sep 22, 1945, LOS: 0 ADMISSION DATE:  10/21/2022, CONSULTATION DATE: 11/06/2022 REFERRING MD:  Elayne Snare K, DO, CHIEF COMPLAINT:  shock  History of Present Illness:  The patient is a 77 year old gentleman with advanced Alzheimer's dementia who is bedbound, nonverbal, unable to eat, with PEG tube, who presents from home with respiratory distress.  He had a recent ICU admission for malfunctioning G-tube.  He also had aspiration pneumonia which they found was due to son feeding the patient despite obvious aspiration.  He also has recurrent UTIs with ESBL organisms.  The patient's daughter is at the bedside and says that the patient was on hospice because they were unable to take care of him.  Patient's family said that there was blood coming out of the G-tube as well as his mouth which prompted the phone call to EMS.  He has also been febrile and hypotensive since he arrived here.  found to be COVID-positive.  He was hypotensive despite fluid resuscitation and started on vasopressors.  PCCM was called for further care and admission.  Pertinent  Medical History  Advanced Alzheimer's dementia PEG tube dependent Bedbound, nonverbal   Significant Hospital Events: Including procedures, antibiotic start and stop dates in addition to other pertinent events     Interim History / Subjective:    Objective   Blood pressure (!) 75/65, pulse (!) 137, temperature 97.9 F (36.6 C), temperature source Temporal, resp. rate (!) 27, SpO2 93%.       No intake or output data in the 24 hours ending 11/06/2022 1423 There were no vitals filed for this visit.  Examination: General: Acutely and chronically ill-appearing, contractured, appears moribund HENT: On nonrebreather Lungs: Shallow tachypneic respirations, no wheeze Cardiovascular: Tachycardic, regular Abdomen: PEG tube in place Extremities: Thin, contractured, cachectic Neuro: Nonverbal,  nonresponsive   Resolved Hospital Problem list     Assessment & Plan:   Acute hypoxemic respiratory failure COVID-19 pneumonia, recurrent aspiration pneumonia, G-tube dependent End-stage Alzheimer's dementia  severe protein calorie malnutrition Septic shock Acute blood loss anemia Lactic acidosis Hyperkalemia Acute kidney injury Anion gap metabolic acidosis Hypovolemic hyponatremia  This patient is actively dying.  Under no circumstances should he receive any advanced life support such as intubation, mechanical ventilation, CPR, or any vasopressors beyond what he is already getting.  Unfortunately there is no reversible treatable component with regards to his end-stage dementia.  I have spoken in very explicit terms to the patient's family about this.  She says that they put him in hospice because they can take care of them.  There are multiple family members were reviewed to me speaking only and do not understand.  I have emphatically told her to call that her family in and tell them that he is dying and will die this admission.  My recommendation is to consult the hospice, admit to [facility], and transition to inpatient comfort care as soon as possible.  Continue the current vasopressors without escalation of care, no more labs, normal blood, no more procedures.  I have communicated this plan of care to the ED doctor as well as bedside nurse.  Please call if we can be of further assistance.  Addendum:  I was called to bedside for further clarification on goals.  [name] was in distress that she felt she was being "asked" to make decisions for her father. They have taken care of him like this for a long time and understand that his death is  not anyone's fault and feel that this is up to God and that he will go to heaven. They did not want the burden of making any choices for him, but I think these are choices being made outside of our control.   The plan will be to admit the  patient under CCM service to the palliative care floor with plans to initiate morphine gtt for dyspnea and respiratory symptoms. He may not even survive to make it to the floor despite being on 2 vasopressors with a BP of 40/20. The family is hopeful they can make it to a room.   Additional cc time 30 minutes.     Labs   CBC: Recent Labs  Lab 10/12/22 0241 10/13/22 0227 11/02/2022 1200  WBC 4.5 4.5 7.0  NEUTROABS 2.9 2.4 6.2  HGB 9.4* 9.6* 5.3*  HCT 27.1* 28.1* 15.8*  MCV 96.1 96.2 101.3*  PLT 292 265 160    Basic Metabolic Panel: Recent Labs  Lab 10/12/22 0241 10/13/22 0227 11/06/2022 1050  NA 131* 131* 125*  K 4.3 4.6 6.8*  CL 104 101 92*  CO2 20* 20* 16*  GLUCOSE 128* 91 81  BUN 12 13 55*  CREATININE 0.70 0.68 1.42*  CALCIUM 8.2* 8.3* 8.1*  MG 1.8 1.9  --   PHOS 2.2* 4.1  --    GFR: Estimated Creatinine Clearance: 25.6 mL/min (A) (by C-G formula based on SCr of 1.42 mg/dL (H)). Recent Labs  Lab 10/12/22 0241 10/13/22 0227 10/21/2022 1113 11/11/2022 1200 10/22/2022 1305  WBC 4.5 4.5  --  7.0  --   LATICACIDVEN  --   --  4.5*  --  7.0*    Liver Function Tests: Recent Labs  Lab 10/12/22 0241 10/13/22 0227 11/06/2022 1050  AST 25 27 38  ALT 25 23 29   ALKPHOS 60 55 43  BILITOT 0.5 <0.1* 0.4  PROT 6.0* 5.8* 5.2*  ALBUMIN 2.5* 2.6* 2.4*   No results for input(s): "LIPASE", "AMYLASE" in the last 168 hours. No results for input(s): "AMMONIA" in the last 168 hours.  ABG    Component Value Date/Time   TCO2 29 08/04/2017 1406     Coagulation Profile: No results for input(s): "INR", "PROTIME" in the last 168 hours.  Cardiac Enzymes: No results for input(s): "CKTOTAL", "CKMB", "CKMBINDEX", "TROPONINI" in the last 168 hours.  HbA1C: Hgb A1c MFr Bld  Date/Time Value Ref Range Status  04/25/2022 05:31 AM 5.0 4.8 - 5.6 % Final    Comment:    (NOTE) Pre diabetes:          5.7%-6.4%  Diabetes:              >6.4%  Glycemic control for   <7.0% adults with  diabetes   01/27/2020 04:43 AM 4.9 4.8 - 5.6 % Final    Comment:    (NOTE) Pre diabetes:          5.7%-6.4%  Diabetes:              >6.4%  Glycemic control for   <7.0% adults with diabetes     CBG: Recent Labs  Lab 10/14/22 0723 10/14/22 1210 10/14/22 1626 11/01/2022 1257 10/16/2022 1357  GLUCAP 122* 115* 80 149* 273*    Review of Systems:   Unable to obtain  Past Medical History:  He,  has a past medical history of Adenomatous polyp of colon (06/2010), Anxiety, Chronic headaches, Depression, Diverticulosis of colon (08/2010), Internal hemorrhoids (08/2010), Prostate cancer (HCC), Subdural hematoma, chronic (  HCC), and Tuberculosis.   Surgical History:   Past Surgical History:  Procedure Laterality Date   BIOPSY  01/29/2020   Procedure: BIOPSY;  Surgeon: Hilarie Fredrickson, MD;  Location: [facility] ENDOSCOPY;  Service: Endoscopy;;   COLONOSCOPY WITH PROPOFOL N/A 01/29/2020   Procedure: COLONOSCOPY WITH PROPOFOL;  Surgeon: Hilarie Fredrickson, MD;  Location: [facility] ENDOSCOPY;  Service: Endoscopy;  Laterality: N/A;   ESOPHAGOGASTRODUODENOSCOPY (EGD) WITH PROPOFOL N/A 01/29/2020   Procedure: ESOPHAGOGASTRODUODENOSCOPY (EGD) WITH PROPOFOL;  Surgeon: Hilarie Fredrickson, MD;  Location: [facility] ENDOSCOPY;  Service: Endoscopy;  Laterality: N/A;   FLEXIBLE SIGMOIDOSCOPY  03/29/2012   Procedure: FLEXIBLE SIGMOIDOSCOPY;  Surgeon: Louis Meckel, MD;  Location: [facility] ENDOSCOPY;  Service: Endoscopy;  Laterality: N/A;  possibly may do colonoscopy but prep is only for a flex   FLEXIBLE SIGMOIDOSCOPY  03/31/2012   Procedure: FLEXIBLE SIGMOIDOSCOPY;  Surgeon: Louis Meckel, MD;  Location: [facility] ENDOSCOPY;  Service: Endoscopy;  Laterality: N/A;   HEMORRHOID BANDING  03/31/2012   Procedure: HEMORRHOID BANDING;  Surgeon: Louis Meckel, MD;  Location: [facility] ENDOSCOPY;  Service: Endoscopy;  Laterality: N/A;   HOT HEMOSTASIS N/A 01/29/2020   Procedure: HOT HEMOSTASIS (ARGON PLASMA COAGULATION/BICAP);  Surgeon: Hilarie Fredrickson, MD;   Location: [facility] ENDOSCOPY;  Service: Endoscopy;  Laterality: N/A;   MULTIPLE EXTRACTIONS WITH ALVEOLOPLASTY N/A 08/08/2013   Procedure: MULTIPLE EXTRACTION OF TEETH #1, 2, 3, 6, 7, 9, 11, 15, 16, 17, 20, 21, 22, 23, 24, 26, 29, 30, 32 WITH ALVEOLOPLASTY AND REMOVAL BUCCAL EXOSTOSIS LEFT MAXILLA;  Surgeon: Georgia Lopes, DDS;  Location: [facility] OR;  Service: Oral Surgery;  Laterality: N/A;   shrapnel removal     skull during [name] War     Social History:   reports that he quit smoking about 30 years ago. His smoking use included cigarettes. He started smoking about 35 years ago. He has a 1.3 pack-year smoking history. He has never used smokeless tobacco. He reports that he does not drink alcohol and does not use drugs.   Family History:  His family history is not on file.   Allergies No Known Allergies   Home Medications  Prior to Admission medications   Medication Sig Start Date End Date Taking? Authorizing Provider  acetaminophen (TYLENOL) 325 MG tablet Place 2 tablets (650 mg total) into feeding tube every 6 (six) hours as needed for mild pain (or Fever >/= 101). 10/13/22   Marguerita Merles Latif, DO  amoxicillin-clavulanate (AUGMENTIN) 600-42.9 MG/5ML suspension Place 7.3 mLs (875 mg total) into feeding tube 2 (two) times daily. 10/13/22   Sheikh, Omair Latif, DO  GERI-TUSSIN 100 MG/5ML liquid Place 5 mLs into feeding tube every 4 (four) hours as needed for cough or to loosen phlegm. 04/21/22   [provider]  Nutritional Supplements (FEEDING SUPPLEMENT, JEVITY 1.2 CAL,) LIQD Place 1,000 mLs into feeding tube continuous. 1,000 mL, Per Tube, at 55 mL/hr, Continuous. 04/27/22   Hongalgi, Maximino Greenland, MD  ondansetron (ZOFRAN) 4 MG tablet Place 1 tablet (4 mg total) into feeding tube every 6 (six) hours as needed for nausea. 10/13/22   Marguerita Merles Latif, DO  Water For Irrigation, Sterile (FREE WATER) SOLN Place 120 mLs into feeding tube every 6 (six) hours. 04/27/22   Hongalgi, Maximino Greenland, MD      Critical care time:     The patient is critically ill due to encephalopathy, shock, respiratory failure.  Critical care was necessary to treat or prevent imminent or life-threatening deterioration.  Critical  care was time spent personally by me on the following activities: development of treatment plan with patient and/or surrogate as well as nursing, discussions with consultants, evaluation of patient's response to treatment, examination of patient, obtaining history from patient or surrogate, ordering and performing treatments and interventions, ordering and review of laboratory studies, ordering and review of radiographic studies, pulse oximetry, re-evaluation of patient's condition and participation in multidisciplinary rounds.   Critical Care Time devoted to patient care services described in this note is 50 minutes. This time reflects time of care of this signee Charlott Holler . This critical care time does not reflect separately billable procedures or procedure time, teaching time or supervisory time of PA/NP/Med student/Med Resident etc but could involve care discussion time.       Charlott Holler [facility] 11/07/2022 2:23 PM  Pager: see AMION  If no response to pager , please call critical care on call (see AMION) until 7pm After 7:00 pm call Elink

## 2022-11-15 NOTE — ED Notes (Signed)
Shann Medal and this RN dual confirmed pt's time of death at 46. Celine Mans, MD notified.

## 2022-11-15 NOTE — ED Notes (Signed)
ICU MD at bedside speaking with family

## 2022-11-15 NOTE — ED Notes (Signed)
ED TO INPATIENT HANDOFF REPORT  ED Nurse Name and Phone #: Jesse Sans, [phone]  S Name/Age/Gender [name] 77 y.o. male Room/Bed: TRACC/TRACC  Code Status   Code Status: Prior  Home/SNF/Other Home Patient oriented to: disoriented  Is this baseline?  N/a  Triage Complete: Triage complete  Chief Complaint Sepsis due to pneumonia (HCC) [J18.9, A41.9]  Triage Note Pt BIB [facility] as Code Sepsis r/t possible infections of peg tube.  Symptoms began yesterday. Pt severely contracted and alert to verbal and touch.  Pt feels febrile.    60/30 on scene for EMS.  70/50 after 500 mL of fluid.   HR 130 on scene 117 after fluids.  CBG 109. 92% RA on scene 100% 4L.   Allergies No Known Allergies  Level of Care/Admitting Diagnosis ED Disposition     ED Disposition  Admit   Condition  --   Comment  Hospital Area: [facility] [[id number]]  Level of Care: Palliative Care [15]  May admit patient to [facility] or [facility] if equivalent level of care is available:: No  Covid Evaluation: Asymptomatic - no recent exposure (last 10 days) testing not required  Diagnosis: Sepsis due to pneumonia [facility]) [[id number]]  Admitting Physician: Jacques Navy [[id number]]  Attending Physician: Jacques Navy [[id number]]  Certification:: I certify this patient will need inpatient services for at least 2 midnights  Estimated Length of Stay: 4          B Medical/Surgery History Past Medical History:  Diagnosis Date   Adenomatous polyp of colon 06/2010   4 polyps removed at colonoscopy by Dr Marina Goodell   Anxiety    Chronic headaches    Depression    Diverticulosis of colon 08/2010   Internal hemorrhoids 08/2010   Prostate cancer [facility])    receiving radiation treatment   Subdural hematoma, chronic (HCC)    Tuberculosis    Past Surgical History:  Procedure Laterality Date   BIOPSY  01/29/2020   Procedure: BIOPSY;  Surgeon: Hilarie Fredrickson, MD;  Location: [facility] ENDOSCOPY;  Service: Endoscopy;;    COLONOSCOPY WITH PROPOFOL N/A 01/29/2020   Procedure: COLONOSCOPY WITH PROPOFOL;  Surgeon: Hilarie Fredrickson, MD;  Location: [facility] ENDOSCOPY;  Service: Endoscopy;  Laterality: N/A;   ESOPHAGOGASTRODUODENOSCOPY (EGD) WITH PROPOFOL N/A 01/29/2020   Procedure: ESOPHAGOGASTRODUODENOSCOPY (EGD) WITH PROPOFOL;  Surgeon: Hilarie Fredrickson, MD;  Location[facility] ENDOSCOPY;  Service: Endoscopy;  Laterality: N/A;   FLEXIBLE SIGMOIDOSCOPY  03/29/2012   Procedure: FLEXIBLE SIGMOIDOSCOPY;  Surgeon: Louis Meckel, MD;  Location: [facility] ENDOSCOPY;  Service: Endoscopy;  Laterality: N/A;  possibly may do colonoscopy but prep is only for a flex   FLEXIBLE SIGMOIDOSCOPY  03/31/2012   Procedure: FLEXIBLE SIGMOIDOSCOPY;  Surgeon: Louis Meckel, MD;  Location: [facility] ENDOSCOPY;  Service: Endoscopy;  Laterality: N/A;   HEMORRHOID BANDING  03/31/2012   Procedure: HEMORRHOID BANDING;  Surgeon: Louis Meckel, MD;  Location: [facility] ENDOSCOPY;  Service: Endoscopy;  Laterality: N/A;   HOT HEMOSTASIS N/A 01/29/2020   Procedure: HOT HEMOSTASIS (ARGON PLASMA COAGULATION/BICAP);  Surgeon: Hilarie Fredrickson, MD;  Location: [facility] ENDOSCOPY;  Service: Endoscopy;  Laterality: N/A;   MULTIPLE EXTRACTIONS WITH ALVEOLOPLASTY N/A 08/08/2013   Procedure: MULTIPLE EXTRACTION OF TEETH #1, 2, 3, 6, 7, 9, 11, 15, 16, 17, 20, 21, 22, 23, 24, 26, 29, 30, 32 WITH ALVEOLOPLASTY AND REMOVAL BUCCAL EXOSTOSIS LEFT MAXILLA;  Surgeon: Georgia Lopes, DDS;  Location: [facility] OR;  Service: Oral Surgery;  Laterality: N/A;  shrapnel removal     skull during [name] War     A IV Location/Drains/Wounds Patient Lines/Drains/Airways Status     Active Line/Drains/Airways     Name Placement date Placement time Site Days   Peripheral IV 11/12/2022 Anterior;Left External jugular 10/23/2022  1039  External jugular  less than 1   CVC Triple Lumen 11/01/2022 Right Internal jugular 20 cm 11/06/2022  1232  -- less than 1   Gastrostomy/Enterostomy LUQ 10/10/22  --  LUQ  8   Urethral Catheter --  --   --  --            Intake/Output Last 24 hours No intake or output data in the 24 hours ending 10/21/2022 1714  Labs/Imaging Results for orders placed or performed during the hospital encounter of 10/21/2022 (from the past 48 hour(s))  Resp panel by RT-PCR (RSV, Flu A&B, Covid) Anterior Nasal Swab     Status: Abnormal   Collection Time: 10/21/2022 10:42 AM   Specimen: Anterior Nasal Swab  Result Value Ref Range   SARS Coronavirus 2 by RT PCR POSITIVE (A) NEGATIVE   Influenza A by PCR NEGATIVE NEGATIVE   Influenza B by PCR NEGATIVE NEGATIVE    Comment: (NOTE) The Xpert Xpress SARS-CoV-2/FLU/RSV plus assay is intended as an aid in the diagnosis of influenza from Nasopharyngeal swab specimens and should not be used as a sole basis for treatment. Nasal washings and aspirates are unacceptable for Xpert Xpress SARS-CoV-2/FLU/RSV testing.  Fact Sheet for Patients: BloggerCourse.com  Fact Sheet for Healthcare Providers: SeriousBroker.it  This test is not yet approved or cleared by the [place] FDA and has been authorized for detection and/or diagnosis of SARS-CoV-2 by FDA under an Emergency Use Authorization (EUA). This EUA will remain in effect (meaning this test can be used) for the duration of the COVID-19 declaration under Section 564(b)(1) of the Act, 21 U.S.C. section 360bbb-3(b)(1), unless the authorization is terminated or revoked.     Resp Syncytial Virus by PCR NEGATIVE NEGATIVE    Comment: (NOTE) Fact Sheet for Patients: BloggerCourse.com  Fact Sheet for Healthcare Providers: SeriousBroker.it  This test is not yet approved or cleared by the [place] FDA and has been authorized for detection and/or diagnosis of SARS-CoV-2 by FDA under an Emergency Use Authorization (EUA). This EUA will remain in effect (meaning this test can be used) for the duration of  the COVID-19 declaration under Section 564(b)(1) of the Act, 21 U.S.C. section 360bbb-3(b)(1), unless the authorization is terminated or revoked.  Performed at [facility], [address], [place], [place] [address]   Comprehensive metabolic panel     Status: Abnormal   Collection Time: 11/01/2022 10:50 AM  Result Value Ref Range   Sodium 125 (L) 135 - 145 mmol/L   Potassium 6.8 (HH) 3.5 - 5.1 mmol/L    Comment: CRITICAL RESULT CALLED TO AMBER FRANKLIN, RN, READ BACK BY AND VERIFIED WITH W SMITH AT 1207 ON 8.4.2024   Chloride 92 (L) 98 - 111 mmol/L   CO2 16 (L) 22 - 32 mmol/L   Glucose, Bld 81 70 - 99 mg/dL    Comment: Glucose reference range applies only to samples taken after fasting for at least 8 hours.   BUN 55 (H) 8 - 23 mg/dL   Creatinine, Ser 5.78 (H) 0.61 - 1.24 mg/dL   Calcium 8.1 (L) 8.9 - 10.3 mg/dL   Total Protein 5.2 (L) 6.5 - 8.1 g/dL   Albumin 2.4 (L) 3.5 -  5.0 g/dL   AST 38 15 - 41 U/L   ALT 29 0 - 44 U/L   Alkaline Phosphatase 43 38 - 126 U/L   Total Bilirubin 0.4 0.3 - 1.2 mg/dL   GFR, Estimated 52 (L) >60 mL/min    Comment: (NOTE) Calculated using the CKD-EPI Creatinine Equation (2021)    Anion gap 17 (H) 5 - 15    Comment: Performed at [facility], [address] [place], [place] [address]  I-Stat Lactic Acid, ED     Status: Abnormal   Collection Time: 11/07/2022 11:13 AM  Result Value Ref Range   Lactic Acid, Venous 4.5 (HH) 0.5 - 1.9 mmol/L   Comment NOTIFIED PHYSICIAN   CBC with Differential/Platelet     Status: Abnormal   Collection Time: 10/22/2022 12:00 PM  Result Value Ref Range   WBC 7.0 4.0 - 10.5 K/uL   RBC 1.56 (L) 4.22 - 5.81 MIL/uL   Hemoglobin 5.3 (LL) 13.0 - 17.0 g/dL    Comment: REPEATED TO VERIFY THIS CRITICAL RESULT HAS VERIFIED AND BEEN CALLED TO AMBER FRANKLIN, RN BY [employer] ON 08 04 2024 AT 1234, AND HAS BEEN READ BACK.     HCT 15.8 (L) 39.0 - 52.0 %   MCV 101.3 (H) 80.0 - 100.0 fL   MCH 34.0 26.0 - 34.0 pg    MCHC 33.5 30.0 - 36.0 g/dL   RDW 60.4 54.0 - 98.1 %   Platelets 160 150 - 400 K/uL    Comment: REPEATED TO VERIFY   nRBC 0.0 0.0 - 0.2 %   Neutrophils Relative % 89 %   Neutro Abs 6.2 1.7 - 7.7 K/uL   Lymphocytes Relative 5 %   Lymphs Abs 0.4 (L) 0.7 - 4.0 K/uL   Monocytes Relative 5 %   Monocytes Absolute 0.3 0.1 - 1.0 K/uL   Eosinophils Relative 0 %   Eosinophils Absolute 0.0 0.0 - 0.5 K/uL   Basophils Relative 0 %   Basophils Absolute 0.0 0.0 - 0.1 K/uL   Immature Granulocytes 1 %   Abs Immature Granulocytes 0.08 (H) 0.00 - 0.07 K/uL    Comment: Performed at [facility], [address], [place], [place] [address]  CBG monitoring, ED     Status: Abnormal   Collection Time: 10/20/2022 12:57 PM  Result Value Ref Range   Glucose-Capillary 149 (H) 70 - 99 mg/dL    Comment: Glucose reference range applies only to samples taken after fasting for at least 8 hours.  I-Stat Lactic Acid, ED     Status: Abnormal   Collection Time: 10/26/2022  1:05 PM  Result Value Ref Range   Lactic Acid, Venous 7.0 (HH) 0.5 - 1.9 mmol/L   Comment NOTIFIED PHYSICIAN   Prepare RBC (crossmatch)     Status: None   Collection Time: 11/02/2022  1:12 PM  Result Value Ref Range   Order Confirmation      ORDER PROCESSED BY BLOOD BANK Performed at [facility], [address], [place], [place] [address]   Type and screen [facility]     Status: None (Preliminary result)   Collection Time: 10/28/2022  1:20 PM  Result Value Ref Range   ABO/RH(D) O POS    Antibody Screen NEG    Sample Expiration      10/21/2022,2359 Performed at [facility], [address] [place], [place] [address]    Unit Number [id number]    Blood Component  Type RED CELLS,LR    Unit division 00    Status of Unit ISSUED    Transfusion Status OK TO TRANSFUSE    Crossmatch Result COMPATIBLE    Unit tag comment VERBAL ORDERS PER DR [facility]    Unit Number [id number]    Blood Component Type RED  CELLS,LR    Unit division 00    Status of Unit REL FROM [facility]    Transfusion Status OK TO TRANSFUSE    Crossmatch Result NOT NEEDED   Urinalysis, w/ Reflex to Culture (Infection Suspected) -Urine, Clean Catch     Status: Abnormal   Collection Time: 11/09/2022  1:35 PM  Result Value Ref Range   Specimen Source URINE, CLEAN CATCH    Color, Urine YELLOW YELLOW   APPearance HAZY (A) CLEAR   Specific Gravity, Urine 1.015 1.005 - 1.030   pH 5.0 5.0 - 8.0   Glucose, UA 150 (A) NEGATIVE mg/dL   Hgb urine dipstick SMALL (A) NEGATIVE   Bilirubin Urine NEGATIVE NEGATIVE   Ketones, ur NEGATIVE NEGATIVE mg/dL   Protein, ur 30 (A) NEGATIVE mg/dL   Nitrite NEGATIVE NEGATIVE   Leukocytes,Ua TRACE (A) NEGATIVE   RBC / HPF 21-50 0 - 5 RBC/hpf   WBC, UA 6-10 0 - 5 WBC/hpf    Comment:        Reflex urine culture not performed if WBC <=10, OR if Squamous epithelial cells >5. If Squamous epithelial cells >5 suggest recollection.    Bacteria, UA RARE (A) NONE SEEN   Squamous Epithelial / HPF 0-5 0 - 5 /HPF   Mucus PRESENT    Hyaline Casts, UA PRESENT    Granular Casts, UA PRESENT     Comment: Performed at [facility], [address], [place], [place] [address]  Prepare RBC     Status: None   Collection Time: 10/29/2022  1:44 PM  Result Value Ref Range   Order Confirmation      ORDER PROCESSED BY BLOOD BANK Performed at [facility], [address] [place], [place] [address]   CBG monitoring, ED     Status: Abnormal   Collection Time: 10/21/2022  1:57 PM  Result Value Ref Range   Glucose-Capillary 273 (H) 70 - 99 mg/dL    Comment: Glucose reference range applies only to samples taken after fasting for at least 8 hours.  Prepare RBC     Status: None   Collection Time: 11/10/2022  2:05 PM  Result Value Ref Range   Order Confirmation      ORDER PROCESSED BY BLOOD BANK BB SAMPLE OR UNITS ALREADY AVAILABLE Performed at [facility], [address], [place], [place] [address]     DG Chest Portable 1 View  Result Date: 11/12/2022 CLINICAL DATA:  confirmation central line EXAM: PORTABLE CHEST 1 VIEW COMPARISON:  CXR 10/22/2022 FINDINGS: Interval placement of a right-sided central venous catheter with the tip of the cavoatrial junction. No evidence of a pneumothorax. No focal airspace opacity. Unchanged cardiac and mediastinal contours. No radiographically apparent displaced rib fractures. Visualized upper abdomen is unremarkable. IMPRESSION: Interval placement of a right-sided central venous catheter with the tip at the cavoatrial junction. No evidence of a pneumothorax. Electronically Signed   By: Lorenza Cambridge M.D.   On: 10/17/2022 12:55   DG Chest Port 1 View  Result Date: 10/26/2022 CLINICAL DATA:  Questionable sepsis.  Evaluate for abnormality. EXAM: PORTABLE CHEST 1 VIEW COMPARISON:  10/12/2022 unchanged FINDINGS: The heart  size and mediastinal contours are within normal limits. Asymmetric elevation of right hemidiaphragm. Both lungs are clear. The visualized skeletal structures are unremarkable. IMPRESSION: No active disease. Electronically Signed   By: Signa Kell M.D.   On: 11/04/2022 11:46    Pending Labs Unresulted Labs (From admission, onward)     Start     Ordered   11/02/2022 1405  Type and screen [facility]  (**Emergency Blood Administration**)  ONCE - STAT,   STAT       Comments: [facility]   Question Answer Comment  Special Requirements for Red Blood Exchange   Release to patient Immediate      10/31/2022 1405   11/05/2022 1300  Protime-INR  Once,   STAT        11/12/2022 1300   11/11/2022 1300  APTT  Once,   STAT        10/29/2022 1300   10/27/2022 1042  CBC with Differential  (Septic presentation on arrival (screening labs, nursing and treatment orders for obvious sepsis))  ONCE - STAT,   STAT        11/08/2022 1045   11/02/2022 1042  Blood Culture (routine x 2)  (Septic presentation on arrival (screening labs, nursing and  treatment orders for obvious sepsis))  BLOOD CULTURE X 2,   STAT      10/28/2022 1045            Vitals/Pain Today's Vitals   10/21/2022 1650 11/11/2022 1658 11/07/2022 1700 11/07/2022 1705  BP: (!) 86/66 (!) 38/29 (!) 42/34 (!) 95/44  Pulse:      Resp: (!) 23 (!) 21 (!) 21 (!) 22  Temp:      TempSrc:      SpO2:        Isolation Precautions No active isolations  Medications Medications  lactated ringers infusion ( Intravenous New Bag/Given 10/19/2022 1453)  0.9 %  sodium chloride infusion (0 mLs Intravenous Hold 10/26/2022 1121)  0.9 %  sodium chloride infusion (Manually program via Guardrails IV Fluids) (0 mLs Intravenous Hold 10/29/2022 1335)  norepinephrine (LEVOPHED) 4mg  in (0.016 mg/mL) premix infusion (40 mcg/min Intravenous New Bag/Given 10/22/2022 1637)  0.9 %  sodium chloride infusion (Manually program via Guardrails IV Fluids) (0 mLs Intravenous Hold 10/17/2022 1351)  0.9 %  sodium chloride infusion (Manually program via Guardrails IV Fluids) ( Intravenous Not Given 10/17/2022 1408)  fentaNYL (SUBLIMAZE) injection 25 mcg (has no administration in time range)  lactated ringers bolus 1,000 mL (0 mLs Intravenous Stopped 10/20/2022 1203)    And  lactated ringers bolus 250 mL (0 mLs Intravenous Stopped 11/11/2022 1125)  vancomycin (VANCOCIN) IVPB 1000 mg/200 mL premix (0 mg Intravenous Stopped 11/11/2022 1335)  piperacillin-tazobactam (ZOSYN) IVPB 3.375 g (0 g Intravenous Stopped 10/29/2022 1203)  pantoprazole (PROTONIX) 80 mg /NS 100 mL IVPB (0 mg Intravenous Stopped 11/07/2022 1319)  calcium gluconate 1 g/ 50 mL sodium chloride IVPB (0 mg Intravenous Stopped 11/02/2022 1319)  insulin aspart (novoLOG) injection 5 Units (5 Units Intravenous Given 10/22/2022 1307)    And  dextrose 50 % solution 50 mL (50 mLs Intravenous Given 10/27/2022 1302)  lactated ringers bolus 1,000 mL (1,000 mLs Intravenous New Bag/Given 11/06/2022 1356)  fentaNYL (SUBLIMAZE) injection 25 mcg (25 mcg Intravenous Given 11/09/2022 1549)  fentaNYL (SUBLIMAZE)  injection 25 mcg (25 mcg Intravenous Given 11/03/2022 1644)    Mobility non-ambulatory     Focused Assessments     R Recommendations: See Admitting Provider Note  Report given to:   Additional Notes: pt is on NRB. Family at bedside.

## 2022-11-15 NOTE — H&P (Signed)
Please see consult note from same day

## 2022-11-15 DEATH — deceased
